# Patient Record
Sex: Female | Born: 1950 | Race: White | Hispanic: No | Marital: Married | Smoking: Never smoker
Health system: Southern US, Community
[De-identification: ages and names within clinical notes are randomized; demographics above are authoritative.]

## PROBLEM LIST (undated history)

## (undated) DIAGNOSIS — C50912 Malignant neoplasm of unspecified site of left female breast: Secondary | ICD-10-CM

## (undated) DIAGNOSIS — M8580 Other specified disorders of bone density and structure, unspecified site: Secondary | ICD-10-CM

## (undated) DIAGNOSIS — M1990 Unspecified osteoarthritis, unspecified site: Secondary | ICD-10-CM

## (undated) DIAGNOSIS — Z9889 Other specified postprocedural states: Secondary | ICD-10-CM

## (undated) DIAGNOSIS — R002 Palpitations: Secondary | ICD-10-CM

## (undated) DIAGNOSIS — T7840XA Allergy, unspecified, initial encounter: Secondary | ICD-10-CM

## (undated) DIAGNOSIS — E785 Hyperlipidemia, unspecified: Secondary | ICD-10-CM

## (undated) DIAGNOSIS — R112 Nausea with vomiting, unspecified: Secondary | ICD-10-CM

## (undated) DIAGNOSIS — J189 Pneumonia, unspecified organism: Secondary | ICD-10-CM

## (undated) DIAGNOSIS — R42 Dizziness and giddiness: Secondary | ICD-10-CM

## (undated) HISTORY — PX: CHOLECYSTECTOMY: SHX55

## (undated) HISTORY — PX: EYE SURGERY: SHX253

## (undated) HISTORY — DX: Dizziness and giddiness: R42

## (undated) HISTORY — PX: COLONOSCOPY: SHX174

## (undated) HISTORY — PX: BREAST SURGERY: SHX581

## (undated) HISTORY — DX: Other specified disorders of bone density and structure, unspecified site: M85.80

## (undated) HISTORY — DX: Unspecified osteoarthritis, unspecified site: M19.90

## (undated) HISTORY — DX: Hyperlipidemia, unspecified: E78.5

---

## 2002-07-28 LAB — HM COLONOSCOPY: HM Colonoscopy: NORMAL

## 2004-11-06 ENCOUNTER — Ambulatory Visit: Payer: Self-pay | Admitting: Internal Medicine

## 2005-11-10 ENCOUNTER — Ambulatory Visit: Payer: Self-pay | Admitting: Internal Medicine

## 2006-12-25 ENCOUNTER — Ambulatory Visit: Payer: Self-pay | Admitting: Internal Medicine

## 2006-12-28 ENCOUNTER — Ambulatory Visit: Payer: Self-pay | Admitting: Internal Medicine

## 2007-03-14 ENCOUNTER — Emergency Department: Payer: Self-pay | Admitting: Emergency Medicine

## 2007-06-30 ENCOUNTER — Ambulatory Visit: Payer: Self-pay | Admitting: Internal Medicine

## 2007-12-01 ENCOUNTER — Ambulatory Visit: Payer: Self-pay | Admitting: Rheumatology

## 2008-01-03 ENCOUNTER — Ambulatory Visit: Payer: Self-pay | Admitting: Internal Medicine

## 2008-05-18 ENCOUNTER — Ambulatory Visit: Payer: Self-pay | Admitting: Internal Medicine

## 2008-06-20 ENCOUNTER — Ambulatory Visit: Payer: Self-pay | Admitting: Internal Medicine

## 2008-12-01 LAB — HM PAP SMEAR: HM Pap smear: NORMAL

## 2008-12-20 ENCOUNTER — Ambulatory Visit: Payer: Self-pay | Admitting: Internal Medicine

## 2008-12-22 ENCOUNTER — Emergency Department: Payer: Self-pay | Admitting: Emergency Medicine

## 2009-03-01 ENCOUNTER — Ambulatory Visit: Payer: Self-pay | Admitting: Internal Medicine

## 2009-12-28 LAB — HM MAMMOGRAPHY

## 2010-03-07 ENCOUNTER — Ambulatory Visit: Payer: Self-pay | Admitting: Internal Medicine

## 2010-03-18 ENCOUNTER — Ambulatory Visit: Payer: Self-pay | Admitting: Cardiovascular Disease

## 2010-03-18 DIAGNOSIS — R55 Syncope and collapse: Secondary | ICD-10-CM | POA: Insufficient documentation

## 2010-07-30 NOTE — Assessment & Plan Note (Signed)
Summary: EKG/AMD  Nurse Visit   Vital Signs:  Patient profile:   60 year old female Height:      73 inches Weight:      173 pounds BMI:     22.91 Pulse rate:   65 / minute Pulse rhythm:   regular BP sitting:   119 / 84  (left arm) Cuff size:   regular  Vitals Entered By: Bishop Dublin, CMA (March 18, 2010 8:38 AM)  Visit Type:  Nurse visit  CC:  EKG/syncope.   Orders Added: 1)  EKG w/ Interpretation [93000]

## 2010-10-02 LAB — HEPATIC FUNCTION PANEL
ALT: 17 U/L (ref 7–35)
AST: 20 U/L (ref 13–35)

## 2010-10-02 LAB — CBC AND DIFFERENTIAL
Hemoglobin: 14.5 g/dL (ref 12.0–16.0)
WBC: 3.9 10^3/mL

## 2010-10-02 LAB — BASIC METABOLIC PANEL
Creatinine: 0.8 mg/dL (ref 0.5–1.1)
Glucose: 97 mg/dL
Potassium: 3.8 mmol/L (ref 3.4–5.3)

## 2010-10-02 LAB — LIPID PANEL: Cholesterol: 178 mg/dL (ref 0–200)

## 2010-10-02 LAB — TSH: TSH: 3.65 u[IU]/mL (ref 0.41–5.90)

## 2011-01-14 ENCOUNTER — Encounter: Payer: Self-pay | Admitting: Cardiovascular Disease

## 2011-02-17 ENCOUNTER — Encounter: Payer: Self-pay | Admitting: Internal Medicine

## 2011-02-17 ENCOUNTER — Ambulatory Visit (INDEPENDENT_AMBULATORY_CARE_PROVIDER_SITE_OTHER): Payer: BC Managed Care – PPO | Admitting: Internal Medicine

## 2011-02-17 VITALS — BP 106/58 | HR 62 | Temp 98.0°F | Resp 14 | Ht 72.5 in | Wt 180.8 lb

## 2011-02-17 DIAGNOSIS — M8580 Other specified disorders of bone density and structure, unspecified site: Secondary | ICD-10-CM

## 2011-02-17 DIAGNOSIS — M949 Disorder of cartilage, unspecified: Secondary | ICD-10-CM

## 2011-02-17 DIAGNOSIS — L719 Rosacea, unspecified: Secondary | ICD-10-CM

## 2011-02-17 DIAGNOSIS — R42 Dizziness and giddiness: Secondary | ICD-10-CM | POA: Insufficient documentation

## 2011-02-17 DIAGNOSIS — E785 Hyperlipidemia, unspecified: Secondary | ICD-10-CM | POA: Insufficient documentation

## 2011-02-17 DIAGNOSIS — Z1231 Encounter for screening mammogram for malignant neoplasm of breast: Secondary | ICD-10-CM

## 2011-02-17 MED ORDER — ALENDRONATE SODIUM 70 MG PO TABS: 70.0000 mg | ORAL_TABLET | ORAL | Status: AC

## 2011-02-17 MED ORDER — DOXYCYCLINE HYCLATE 50 MG PO CAPS: 50.0000 mg | ORAL_CAPSULE | Freq: Two times a day (BID) | ORAL | Status: AC

## 2011-02-17 NOTE — Progress Notes (Signed)
Subjective:    Patient ID: Deborah Mcconnell, female    DOB: 1951/04/24, 60 y.o.   MRN: [id number]  HPI  1. Osteoporosis - Pt with h/o osteopenia on DEXA.  Unable to afford Actonel and Evista because of recent financial issues. Would like to consider alternative medication. Also questions risk of staying on calcium supplementation.  2. Rosacea - Pt followed by dermatology, on [place] and Oracea. Unable to afford oracea. Would like to consider more affordable alternative. Reports rosacea has been well controlled on oracea. Denies dysphagia, abdominal pain, or other complaints related to medication use.  Outpatient Encounter Prescriptions as of 02/17/2011  Medication Sig Dispense Refill  . aspirin EC 81 MG tablet Take 81 mg by mouth as needed.        . Azelaic Acid (FINACEA) 15 % cream Apply topically. After skin is thoroughly washed and patted dry, gently but thoroughly massage a thin film of azelaic acid cream into the affected area twice daily, in the morning and evening.       . meloxicam (MOBIC) 15 MG tablet Take 15 mg by mouth daily.        . simvastatin (ZOCOR) 20 MG tablet Take 20 mg by mouth daily.        Marland Kitchen DISCONTD: calcium citrate-vitamin D (CITRACAL+D) 315-200 MG-UNIT per tablet Take by mouth daily. Take 1500-2000 mg.       . DISCONTD: raloxifene (EVISTA) 60 MG tablet Take 60 mg by mouth daily.        Marland Kitchen DISCONTD: risedronate (ACTONEL) 150 MG tablet Take 150 mg by mouth every 30 (thirty) days. with water on empty stomach, nothing by mouth or lie down for next 30 minutes.       Marland Kitchen alendronate (FOSAMAX) 70 MG tablet Take 1 tablet (70 mg total) by mouth every 7 (seven) days. Take with a full glass of water on an empty stomach.  4 tablet  11  . doxycycline (VIBRAMYCIN) 50 MG capsule Take 1 capsule (50 mg total) by mouth 2 (two) times daily.  60 capsule  6  . DISCONTD: doxycycline (ORACEA) 40 MG capsule Take 40 mg by mouth daily.           Review of Systems  Constitutional: Negative for  fever, chills, appetite change and fatigue.  HENT: Negative for trouble swallowing.   Respiratory: Negative for cough and shortness of breath.   Cardiovascular: Negative for chest pain.  Musculoskeletal: Positive for arthralgias.  Skin: Negative for color change and rash.  Neurological: Negative for weakness.   Filed Vitals:   02/17/11 1438  BP: 106/58  Pulse: 62  Temp: 98 F (36.7 C)  Resp: 14       Objective:   Physical Exam  Constitutional: She is oriented to person, place, and time. She appears well-developed and well-nourished. No distress.  HENT:  Head: Normocephalic and atraumatic.  Nose: Nose normal.  Eyes: Conjunctivae and EOM are normal. Right eye exhibits no discharge. Left eye exhibits no discharge. No scleral icterus.  Neck: Normal range of motion. Neck supple. No tracheal deviation present. No thyromegaly present.  Cardiovascular: Normal rate, regular rhythm and normal heart sounds.  Exam reveals no gallop and no friction rub.   No murmur heard. Pulmonary/Chest: Effort normal and breath sounds normal. No respiratory distress. She has no wheezes. She has no rales. She exhibits no tenderness.  Musculoskeletal: Normal range of motion.  Lymphadenopathy:    She has no cervical adenopathy.  Neurological: She is alert and oriented to  person, place, and time.  Skin: Skin is warm and dry. No rash noted. She is not diaphoretic. No erythema.  Psychiatric: She has a normal mood and affect. Her behavior is normal. Judgment and thought content normal.          Assessment & Plan:  1. Osteoporosis - Will stop Actonel and Evista because of recent financial issues. Will start Fosamax 70mg  weekly. Will request records on last DEXA and make sure pt UTD. We discussed recent data on calcium supplementation and cardiovascular risk. She will stop calcium supplements, however maintain calcium in her diet. She will continue Vit D supplementation at 1000units daily. We will check Vit D  with next labs.  2. Rosacea - Will change to doxycyline 50mg  bid.  Discussed risk of doxycycline and gave handout on side effects. She will call if any problems with medication.  She will avoid sun exposure. RTC in 6 months and prn.

## 2011-02-17 NOTE — Patient Instructions (Addendum)
Stop Actonel and Evista. Start Fosamax. Stop Oracea. Start Doxycyline as per instructions.  Stop calcium supplements. Continue diet rich in calcium (vegetables, nuts, dairy). Take Vit D 1000-2000units daily.      What is this medicine? DOXYCYCLINE (dox i SYE kleen) is a tetracycline antibiotic. It kills certain bacteria or stops their growth. It is used to treat many kinds of infections, like dental, skin, respiratory, and urinary tract infections. It also treats acne, Lyme disease, malaria, and certain sexually transmitted infections. This medicine may be used for other purposes; ask your health care provider or pharmacist if you have questions.   What should I tell my health care provider before I take this medicine? They need to know if you have any of these conditions: -liver disease -long exposure to sunlight like working outdoors -stomach problems like colitis -an unusual or allergic reaction to doxycycline, tetracycline antibiotics, other medicines, foods, dyes, or preservatives -pregnant or trying to get pregnant -breast-feeding   How should I use this medicine? Take this medicine by mouth with a full glass of water. Follow the directions on the prescription label. It is best to take this medicine without food, but if it upsets your stomach take it with food. Take your medicine at regular intervals. Do not take your medicine more often than directed. Take all of your medicine as directed even if you think you are better. Do not skip doses or stop your medicine early.   Talk to your pediatrician regarding the use of this medicine in children. Special care may be needed. While this drug may be prescribed for children as young as 8 years old for selected conditions, precautions do apply.   Overdosage: If you think you have taken too much of this medicine contact a poison control center or emergency room at once. NOTE: This medicine is only for you. Do not share this medicine with  others.   What if I miss a dose? If you miss a dose, take it as soon as you can. If it is almost time for your next dose, take only that dose. Do not take double or extra doses.   What may interact with this medicine? -antacids -barbiturates -birth control pills -bismuth subsalicylate -carbamazepine -methoxyflurane -other antibiotics -phenytoin -vitamins that contain iron -warfarin   This list may not describe all possible interactions. Give your health care provider a list of all the medicines, herbs, non-prescription drugs, or dietary supplements you use. Also tell them if you smoke, drink alcohol, or use illegal drugs. Some items may interact with your medicine.   What should I watch for while using this medicine? Tell your doctor or health care professional if your symptoms do not improve.   Do not treat diarrhea with over the counter products. Contact your doctor if you have diarrhea that lasts more than 2 days or if it is severe and watery.   Do not take this medicine just before going to bed. It may not dissolve properly when you lay down and can cause pain in your throat. Drink plenty of fluids while taking this medicine to also help reduce irritation in your throat.   This medicine can make you more sensitive to the sun. Keep out of the sun. If you cannot avoid being in the sun, wear protective clothing and use sunscreen. Do not use sun lamps or tanning beds/booths.   Birth control pills may not work properly while you are taking this medicine. Talk to your doctor about using an extra  method of birth control.   If you are being treated for a sexually transmitted infection, avoid sexual contact until you have finished your treatment. Your sexual partner may also need treatment.   Avoid antacids, aluminum, calcium, magnesium, and iron products for 4 hours before and 2 hours after taking a dose of this medicine.   If you are using this medicine to prevent malaria, you should  still protect yourself from contact with mosquitos. Stay in screened-in areas, use mosquito nets, keep your body covered, and use an insect repellent.   What side effects may I notice from receiving this medicine? Side effects that you should report to your doctor or health care professional as soon as possible: -allergic reactions like skin rash, itching or hives, swelling of the face, lips, or tongue -difficulty breathing -fever -itching in the rectal or genital area -pain on swallowing -redness, blistering, peeling or loosening of the skin, including inside the mouth -severe stomach pain or cramps -unusual bleeding or bruising -unusually weak or tired -yellowing of the eyes or skin   Side effects that usually do not require medical attention (report to your doctor or health care professional if they continue or are bothersome): -diarrhea -loss of appetite -nausea, vomiting   This list may not describe all possible side effects. Call your doctor for medical advice about side effects. You may report side effects to FDA at 1-800-FDA-1088.   Where should I keep my medicine? Keep out of the reach of children.   Store at room temperature, below 30 degrees C (86 degrees F). Protect from light. Keep container tightly closed. Throw away any unused medicine after the expiration date. Taking this medicine after the expiration date can make you seriously ill.   NOTE:This sheet is a summary. It may not cover all possible information. If you have questions about this medicine, talk to your doctor, pharmacist, or health care provider.      2011, [employer].

## 2011-02-24 ENCOUNTER — Other Ambulatory Visit (INDEPENDENT_AMBULATORY_CARE_PROVIDER_SITE_OTHER): Payer: BC Managed Care – PPO | Admitting: *Deleted

## 2011-02-24 ENCOUNTER — Telehealth: Payer: Self-pay | Admitting: Internal Medicine

## 2011-02-24 DIAGNOSIS — N390 Urinary tract infection, site not specified: Secondary | ICD-10-CM

## 2011-02-24 LAB — POCT URINALYSIS DIPSTICK: Nitrite, UA: NEGATIVE

## 2011-02-24 MED ORDER — SULFAMETHOXAZOLE-TRIMETHOPRIM 800-160 MG PO TABS: 1.0000 | ORAL_TABLET | Freq: Two times a day (BID) | ORAL | Status: AC

## 2011-02-24 NOTE — Telephone Encounter (Signed)
Please contact patient, she needs to know when she had her last labwork, bone density, and mammogram

## 2011-02-25 NOTE — Telephone Encounter (Signed)
Her last labwork that I found was done on 10/02/2010.  Couldn't find anything on mammogram or bone density.

## 2011-02-25 NOTE — Telephone Encounter (Signed)
We will need to request mammogram and bone density from [facility] then.

## 2011-03-24 ENCOUNTER — Telehealth: Payer: Self-pay | Admitting: Internal Medicine

## 2011-03-25 ENCOUNTER — Ambulatory Visit: Payer: Self-pay | Admitting: Internal Medicine

## 2011-03-26 ENCOUNTER — Ambulatory Visit (INDEPENDENT_AMBULATORY_CARE_PROVIDER_SITE_OTHER): Payer: BC Managed Care – PPO | Admitting: Internal Medicine

## 2011-03-26 ENCOUNTER — Encounter: Payer: Self-pay | Admitting: Internal Medicine

## 2011-03-26 VITALS — BP 119/79 | HR 68 | Temp 98.5°F | Resp 14 | Ht 71.0 in | Wt 176.0 lb

## 2011-03-26 DIAGNOSIS — E785 Hyperlipidemia, unspecified: Secondary | ICD-10-CM

## 2011-03-26 DIAGNOSIS — R002 Palpitations: Secondary | ICD-10-CM

## 2011-03-26 DIAGNOSIS — R928 Other abnormal and inconclusive findings on diagnostic imaging of breast: Secondary | ICD-10-CM

## 2011-03-26 NOTE — Patient Instructions (Signed)
We will set up mammogram at [facility]. Return to clinic in 57month.

## 2011-03-26 NOTE — Progress Notes (Signed)
Subjective:    Patient ID: Serita Butcher, female    DOB: 1951/04/10, 60 y.o.   MRN: [id number]  HPI Ms. Banes is a 60 year old female who presents with a concern of abnormal mammogram. She reports that she had her screening mammogram earlier this week and she was called with the report that there is an abnormality in her left breast. We reviewed the mammogram today. He describes an asymmetry within the left breast. The radiologist has recommended compression imaging and possibly ultrasound. This [place] reports that in the past she has had abnormal mammogram of her left breast which required needle biopsy. She also had a lumpectomy in her right breast. Both biopsies were negative. She is concerned that the mammogram performed at [facility] was not compared to previous mammograms performed at [facility] in 2010. She questions if she should return to [facility] for additional imaging or have her imaging performed locally. She denies any new masses that she has appreciated within her breast. She denies any nipple discharge. She denies any fever, chills, fatigue.  Her second concern today is recent episodes of palpitations. She reports that she has had episodes of palpitations in the past and this was worked up with a Holter monitor which was unremarkable. She reports that she feels a fluttering sensation in her chest which radiates up into her neck. These episodes occur at random, however she feels they may be exacerbated by chronic pain she has in her upper back. She denies any chest pain, diaphoresis, nausea, shortness of breath, or other complaints.  Outpatient Encounter Prescriptions as of 03/26/2011  Medication Sig Dispense Refill  . doxycycline (VIBRAMYCIN) 50 MG capsule Take 50 mg by mouth daily.        Marland Kitchen alendronate (FOSAMAX) 70 MG tablet Take 1 tablet (70 mg total) by mouth every 7 (seven) days. Take with a full glass of water on an empty stomach.  4 tablet  11  .  aspirin EC 81 MG tablet Take 81 mg by mouth as needed.        . Azelaic Acid (FINACEA) 15 % cream Apply topically. After skin is thoroughly washed and patted dry, gently but thoroughly massage a thin film of azelaic acid cream into the affected area twice daily, in the morning and evening.       . meloxicam (MOBIC) 15 MG tablet Take 15 mg by mouth daily.        . simvastatin (ZOCOR) 20 MG tablet Take 20 mg by mouth daily.           Review of Systems  Constitutional: Negative for fever, chills, appetite change, fatigue and unexpected weight change.  HENT: Negative for ear pain, congestion, sore throat, trouble swallowing, neck pain, voice change and sinus pressure.   Eyes: Negative for visual disturbance.  Respiratory: Negative for cough, shortness of breath, wheezing and stridor.   Cardiovascular: Positive for palpitations. Negative for chest pain and leg swelling.  Gastrointestinal: Negative for nausea, vomiting, abdominal pain, diarrhea, constipation, blood in stool, abdominal distention and anal bleeding.  Genitourinary: Negative for dysuria and flank pain.  Musculoskeletal: Negative for myalgias, arthralgias and gait problem.  Skin: Negative for color change and rash.  Neurological: Negative for dizziness and headaches.  Hematological: Negative for adenopathy. Does not bruise/bleed easily.  Psychiatric/Behavioral: Negative for suicidal ideas, sleep disturbance and dysphoric mood. The patient is not nervous/anxious.    BP 119/79  Pulse 68  Temp(Src) 98.5 F (36.9 C) (Oral)  Resp 14  Ht 5\' 11"  (1.803 m)  Wt 176 lb (79.833 kg)  BMI 24.55 kg/m2  SpO2 100%     Objective:   Physical Exam  Constitutional: She is oriented to person, place, and time. She appears well-developed and well-nourished. No distress.  HENT:  Head: Normocephalic and atraumatic.  Right Ear: External ear normal.  Left Ear: External ear normal.  Nose: Nose normal.  Mouth/Throat: Oropharynx is clear and moist.  No oropharyngeal exudate.  Eyes: Conjunctivae are normal. Pupils are equal, round, and reactive to light. Right eye exhibits no discharge. Left eye exhibits no discharge. No scleral icterus.  Neck: Normal range of motion. Neck supple. No tracheal deviation present. No thyromegaly present.  Cardiovascular: Normal rate, regular rhythm, normal heart sounds and intact distal pulses.  Exam reveals no gallop and no friction rub.   No murmur heard. Pulmonary/Chest: Effort normal and breath sounds normal. No respiratory distress. She has no wheezes. She has no rales. She exhibits no tenderness. Right breast exhibits no inverted nipple, no mass, no nipple discharge, no skin change and no tenderness. Left breast exhibits no inverted nipple, no mass, no nipple discharge, no skin change and no tenderness. Breasts are symmetrical.    Musculoskeletal: Normal range of motion. She exhibits no edema and no tenderness.  Lymphadenopathy:    She has no cervical adenopathy.  Neurological: She is alert and oriented to person, place, and time. No cranial nerve deficit. She exhibits normal muscle tone. Coordination normal.  Skin: Skin is warm and dry. No rash noted. She is not diaphoretic. No erythema. No pallor.  Psychiatric: She has a normal mood and affect. Her behavior is normal. Judgment and thought content normal.          Assessment & Plan:  1. Abnormal mammogram - in light of her history of having recent needle biopsy performed in the left breast at [facility], I think it makes sense for her to have additional imaging studies performed at [facility] for continuity. This way the radiologist will be able to compare previous images to new images. We will plan to set her up at [facility] for additional imaging of her left breast.  2. Palpitations - in regards to her palpitations, she has had workup in the past including Holter monitor which she reports was normal. I offered to  repeat EKG today. She would prefer to wait until her next visit. I suspect that her palpitations may be worsened by anxiety given recent issues with abnormal mammogram. We will plan to bring her back in one month to reevaluate. If her symptoms are persistent, will consider EKG.

## 2011-03-27 ENCOUNTER — Other Ambulatory Visit: Payer: Self-pay | Admitting: Internal Medicine

## 2011-03-28 ENCOUNTER — Encounter: Payer: Self-pay | Admitting: Internal Medicine

## 2011-03-28 LAB — COMPREHENSIVE METABOLIC PANEL
ALT: 15 U/L (ref 0–35)
Albumin: 4.4 g/dL (ref 3.5–5.2)
BUN: 19 mg/dL (ref 6–23)
CO2: 29 mEq/L (ref 19–32)
Calcium: 9 mg/dL (ref 8.4–10.5)
Chloride: 107 mEq/L (ref 96–112)
Creatinine, Ser: 0.7 mg/dL (ref 0.4–1.2)
GFR: 93.61 mL/min (ref >60.00)
Potassium: 4.2 mEq/L (ref 3.5–5.1)

## 2011-03-28 NOTE — Telephone Encounter (Signed)
Opened in error

## 2011-04-08 ENCOUNTER — Encounter: Payer: Self-pay | Admitting: Internal Medicine

## 2011-04-28 ENCOUNTER — Encounter: Payer: Self-pay | Admitting: Internal Medicine

## 2011-04-28 ENCOUNTER — Other Ambulatory Visit: Payer: Self-pay | Admitting: Internal Medicine

## 2011-04-28 ENCOUNTER — Ambulatory Visit (INDEPENDENT_AMBULATORY_CARE_PROVIDER_SITE_OTHER): Payer: BC Managed Care – PPO | Admitting: Internal Medicine

## 2011-04-28 VITALS — BP 124/77 | HR 68 | Temp 97.9°F | Resp 12 | Ht 71.0 in | Wt 182.0 lb

## 2011-04-28 DIAGNOSIS — R002 Palpitations: Secondary | ICD-10-CM

## 2011-04-28 DIAGNOSIS — M25569 Pain in unspecified knee: Secondary | ICD-10-CM

## 2011-04-28 DIAGNOSIS — M25562 Pain in left knee: Secondary | ICD-10-CM

## 2011-04-28 NOTE — Progress Notes (Signed)
Subjective:    Patient ID: Deborah Mcconnell, female    DOB: 1951/04/06, 60 y.o.   MRN: [id number]  HPI 60 year old female with history of palpitations presents for followup. She reports that she continues to have some episodes of palpitations. These tend to be short lived typically lasting for less than a minute. They tend to be clustered in groups. For example, she will notice palpitations intermittently over a couple of days and then they will go away. She denies any chest pain, diaphoresis, shortness of breath, or other symptoms during her palpitations. She has had workup in the past including Holter monitor which was normal. She does not currently use any medication to stop or prevent palpitations.  She also presents to followup with recent abnormal mammogram. She was noted to have abnormality on mammogram performed at [facility]. She subsequently went and had additional views performed at [facility] and reports that these were read as normal. She is now scheduled for a six-month followup mammogram.  She notes ongoing left knee pain over the last few weeks. She has intermittently had pain in both of her knees but recently pain in her left knee and posterior to her left knee have increased. In the past, she had a Baker's cyst in her left knee and she reports that the pain is similar to that episode. Pain in her knees has limited her ability to walk for exercise. She has taken meloxicam for pain in her knees with minimal improvement. She has not had steroid injections or other interventions. She did have plain x-rays of her left knee in the past.  Outpatient Encounter Prescriptions as of 04/28/2011  Medication Sig Dispense Refill  . alendronate (FOSAMAX) 70 MG tablet Take 1 tablet (70 mg total) by mouth every 7 (seven) days. Take with a full glass of water on an empty stomach.  4 tablet  11  . aspirin EC 81 MG tablet Take 81 mg by mouth as needed.        . Azelaic Acid  (FINACEA) 15 % cream Apply topically. After skin is thoroughly washed and patted dry, gently but thoroughly massage a thin film of azelaic acid cream into the affected area twice daily, in the morning and evening.       Marland Kitchen doxycycline (VIBRAMYCIN) 50 MG capsule Take 50 mg by mouth daily.        . meclizine (ANTIVERT) 25 MG tablet Take 1 tablet by mouth as needed.      . meloxicam (MOBIC) 15 MG tablet TAKE ONE TABLET BY MOUTH EVERY DAY  30 tablet  11  . simvastatin (ZOCOR) 20 MG tablet TAKE ONE TABLET BY MOUTH EVERY DAY  30 tablet  3    Review of Systems  Constitutional: Negative for fever, chills, appetite change, fatigue and unexpected weight change.  HENT: Negative for ear pain, congestion, sore throat, trouble swallowing, neck pain, voice change and sinus pressure.   Eyes: Negative for visual disturbance.  Respiratory: Negative for cough, shortness of breath, wheezing and stridor.   Cardiovascular: Positive for palpitations. Negative for chest pain and leg swelling.  Gastrointestinal: Negative for nausea, vomiting, abdominal pain, diarrhea, constipation, blood in stool, abdominal distention and anal bleeding.  Genitourinary: Negative for dysuria and flank pain.  Musculoskeletal: Positive for myalgias, joint swelling and arthralgias. Negative for gait problem.  Skin: Negative for color change and rash.  Neurological: Negative for dizziness and headaches.  Hematological: Negative for adenopathy. Does not bruise/bleed easily.  Psychiatric/Behavioral: Negative for  suicidal ideas, sleep disturbance and dysphoric mood. The patient is not nervous/anxious.    BP 124/77  Pulse 68  Temp(Src) 97.9 F (36.6 C) (Oral)  Resp 12  Ht 5\' 11"  (1.803 m)  Wt 182 lb (82.555 kg)  BMI 25.38 kg/m2  SpO2 99%     Objective:   Physical Exam  Constitutional: She is oriented to person, place, and time. She appears well-developed and well-nourished. No distress.  HENT:  Head: Normocephalic and atraumatic.    Right Ear: External ear normal.  Left Ear: External ear normal.  Nose: Nose normal.  Mouth/Throat: Oropharynx is clear and moist. No oropharyngeal exudate.  Eyes: Conjunctivae are normal. Pupils are equal, round, and reactive to light. Right eye exhibits no discharge. Left eye exhibits no discharge. No scleral icterus.  Neck: Normal range of motion. Neck supple. No tracheal deviation present. No thyromegaly present.  Cardiovascular: Normal rate, regular rhythm, normal heart sounds and intact distal pulses.  Exam reveals no gallop and no friction rub.   No murmur heard. Pulmonary/Chest: Effort normal and breath sounds normal. No respiratory distress. She has no wheezes. She has no rales. She exhibits no tenderness.  Musculoskeletal: She exhibits no edema and no tenderness.       Right knee: tenderness found.       Left knee: She exhibits decreased range of motion and swelling. tenderness found.       Legs: Lymphadenopathy:    She has no cervical adenopathy.  Neurological: She is alert and oriented to person, place, and time. No cranial nerve deficit. She exhibits normal muscle tone. Coordination normal.  Skin: Skin is warm and dry. No rash noted. She is not diaphoretic. No erythema. No pallor.  Psychiatric: She has a normal mood and affect. Her behavior is normal. Judgment and thought content normal.          Assessment & Plan:  1. Left knee pain -exam is remarkable for crepitus in bilateral knees. Suspect that the pain in her left knee is secondary to osteoarthritis. We discussed getting repeat plain films of her left knee and referring her to orthopedics for steroids and possibly Synvisc injections. She would like to hold off for now. She will call or return to clinic if she would like to pursue any additional evaluation. In the interim, she will continue to use her meloxicam.  2. Palpitations -palpitations have been long-standing. Patient had extensive workup including Holter monitor  which was normal. Today, we again discussed using medications such as beta blockers during episodes of palpitations to stop palpitations and as a preventative measure. She would like to hold off on the addition of any medications at this time. She will call or return to clinic if symptoms are worsening.  3. Abnormal mammogram -patient reports that repeat mammogram performed at [facility] was normal. We will request records on this.

## 2011-05-03 ENCOUNTER — Telehealth: Payer: Self-pay | Admitting: *Deleted

## 2011-05-03 DIAGNOSIS — Z1211 Encounter for screening for malignant neoplasm of colon: Secondary | ICD-10-CM

## 2011-05-03 DIAGNOSIS — M8580 Other specified disorders of bone density and structure, unspecified site: Secondary | ICD-10-CM

## 2011-05-03 NOTE — Telephone Encounter (Signed)
Message copied by Vernie Murders on Sat May 03, 2011 12:42 PM ------      Message from: Ronna Polio A      Created: Tue Apr 29, 2011 11:43 AM      Regarding: Colonoscopy and Bone Density       Pt had MRI left knee in 2009 which showed some degenerative changes. She can call at any time if she would like to repeat imaging. (We discussed this at her visit)      The hospital was NOT able to find colonoscopy or bone density record.  We should schedule both of these at a time convenient for her.

## 2011-05-03 NOTE — Telephone Encounter (Signed)
Error

## 2011-05-05 NOTE — Telephone Encounter (Signed)
Both referrals entered

## 2011-05-07 ENCOUNTER — Other Ambulatory Visit: Payer: Self-pay | Admitting: Internal Medicine

## 2011-05-07 ENCOUNTER — Telehealth: Payer: Self-pay | Admitting: Internal Medicine

## 2011-05-07 ENCOUNTER — Other Ambulatory Visit (INDEPENDENT_AMBULATORY_CARE_PROVIDER_SITE_OTHER): Payer: BC Managed Care – PPO | Admitting: Internal Medicine

## 2011-05-07 DIAGNOSIS — N390 Urinary tract infection, site not specified: Secondary | ICD-10-CM

## 2011-05-07 LAB — POCT URINALYSIS DIPSTICK
Glucose, UA: NEGATIVE
Nitrite, UA: NEGATIVE
Protein, UA: NEGATIVE
Spec Grav, UA: 1.01
Urobilinogen, UA: 0.2

## 2011-05-07 MED ORDER — SULFAMETHOXAZOLE-TRIMETHOPRIM 800-160 MG PO TABS: 1.0000 | ORAL_TABLET | Freq: Two times a day (BID) | ORAL | Status: AC

## 2011-05-07 NOTE — Telephone Encounter (Signed)
Patient has ? urinary tract infection. Will come in to give sample.

## 2011-05-07 NOTE — Telephone Encounter (Signed)
Already taken care of, given ABX

## 2011-05-10 LAB — URINE CULTURE: Organism ID, Bacteria: NO GROWTH

## 2011-07-07 ENCOUNTER — Telehealth: Payer: Self-pay | Admitting: Internal Medicine

## 2011-07-07 NOTE — Telephone Encounter (Signed)
i called pt to check if she had her bone density done and she stated that we were to check to if it was time for the bone density and a colonscopy. I called [facility] Deborah Mcconnell stated that per patient she had colonoscopy done 2005 and she wasn't  for sure about bone density she told me to call [facility] dr Deborah Mcconnell. I called that office and was told that Deborah Deborah Mcconnell bone density was done in 2009 and they had no record of a colonscopy.  Does Deborah Mcconnell need her bone density  And also is it time for the colonscopy Thanks  Zella Ball

## 2011-07-08 NOTE — Telephone Encounter (Signed)
Yes, she will need to be scheduled for bone density. We need to get record on colonoscopy to see when this is due.

## 2011-07-15 NOTE — Telephone Encounter (Signed)
Left message for pt to call office

## 2011-07-16 ENCOUNTER — Emergency Department: Payer: Self-pay | Admitting: Emergency Medicine

## 2011-07-16 LAB — COMPREHENSIVE METABOLIC PANEL
Albumin: 4.1 g/dL (ref 3.4–5.0)
Anion Gap: 14 (ref 7–16)
Chloride: 105 mmol/L (ref 98–107)
Co2: 26 mmol/L (ref 21–32)
Creatinine: 0.63 mg/dL (ref 0.60–1.30)
EGFR (African American): >60
EGFR (Non-African Amer.): >60
Glucose: 109 mg/dL — ABNORMAL HIGH (ref 65–99)
Osmolality: 290 (ref 275–301)
SGPT (ALT): 24 U/L
Sodium: 145 mmol/L (ref 136–145)

## 2011-07-16 LAB — CBC
HCT: 44.3 % (ref 35.0–47.0)
HGB: 14.9 g/dL (ref 12.0–16.0)
MCV: 97 fL (ref 80–100)
Platelet: 242 10*3/uL (ref 150–440)
RDW: 12.8 % (ref 11.5–14.5)
WBC: 6.6 10*3/uL (ref 3.6–11.0)

## 2011-07-16 LAB — TROPONIN I: Troponin-I: <0.02 ng/mL

## 2011-07-16 LAB — CK TOTAL AND CKMB (NOT AT ARMC)
CK, Total: 104 U/L (ref 21–215)
CK-MB: 0.8 ng/mL (ref 0.5–3.6)

## 2011-08-05 ENCOUNTER — Encounter: Payer: Self-pay | Admitting: Internal Medicine

## 2011-08-20 ENCOUNTER — Ambulatory Visit: Payer: BC Managed Care – PPO | Admitting: Internal Medicine

## 2011-08-26 ENCOUNTER — Encounter: Payer: Self-pay | Admitting: Internal Medicine

## 2011-08-26 ENCOUNTER — Ambulatory Visit (INDEPENDENT_AMBULATORY_CARE_PROVIDER_SITE_OTHER): Payer: BC Managed Care – PPO | Admitting: Internal Medicine

## 2011-08-26 VITALS — BP 102/76 | HR 79 | Temp 98.1°F | Ht 73.0 in | Wt 185.0 lb

## 2011-08-26 DIAGNOSIS — H6691 Otitis media, unspecified, right ear: Secondary | ICD-10-CM

## 2011-08-26 DIAGNOSIS — L719 Rosacea, unspecified: Secondary | ICD-10-CM

## 2011-08-26 DIAGNOSIS — H6690 Otitis media, unspecified, unspecified ear: Secondary | ICD-10-CM

## 2011-08-26 MED ORDER — METRONIDAZOLE 0.75 % EX CREA: TOPICAL_CREAM | Freq: Two times a day (BID) | CUTANEOUS | Status: DC

## 2011-08-26 MED ORDER — CLINDAMYCIN PHOS-BENZOYL PEROX 1-5 % EX GEL: Freq: Two times a day (BID) | CUTANEOUS | Status: AC

## 2011-08-26 MED ORDER — PREDNISONE (PAK) 10 MG PO TABS: ORAL_TABLET | ORAL | Status: AC

## 2011-08-26 MED ORDER — AMOXICILLIN-POT CLAVULANATE 875-125 MG PO TABS: 1.0000 | ORAL_TABLET | Freq: Two times a day (BID) | ORAL | Status: AC

## 2011-08-26 NOTE — Progress Notes (Signed)
Subjective:    Patient ID: Deborah Mcconnell, female    DOB: 1950-09-22, 61 y.o.   MRN: [id number]  HPI 61 year old female presents for acute visit complaining of right-sided facial and ear pain. She reports that she has had upper respiratory infection with increased nasal drainage over the last several weeks. Over the last 2 days she has developed severe right-sided facial and ear pain. She describes it as a sensation of pressure or tenderness within her right ear. She has not noticed any change in hearing. She has not taking any medication for this. She does not have any fever or chills. She does not have any trouble moving her job. She does not have any sore throat or trouble swallowing. She does not have any cough or shortness of breath.  She is also concerned about persistent rosacea. She has been followed by dermatology and was treated with topical metronidazole cream and oral doxycycline. She was also using Finacea cream but is no longer able to afford this. She continues to have large nodular pustular lesions on her face. She is interested in other potential options for treatment.  Outpatient Encounter Prescriptions as of 08/26/2011  Medication Sig Dispense Refill  . alendronate (FOSAMAX) 70 MG tablet Take 1 tablet (70 mg total) by mouth every 7 (seven) days. Take with a full glass of water on an empty stomach.  4 tablet  11  . aspirin EC 81 MG tablet Take 81 mg by mouth as needed.        . Azelaic Acid (FINACEA) 15 % cream Apply topically. After skin is thoroughly washed and patted dry, gently but thoroughly massage a thin film of azelaic acid cream into the affected area twice daily, in the morning and evening.       . cholecalciferol (VITAMIN D) 1000 UNITS tablet Take 1,000 Units by mouth daily.      Marland Kitchen doxycycline (VIBRAMYCIN) 50 MG capsule Take 50 mg by mouth daily.        . meclizine (ANTIVERT) 25 MG tablet Take 1 tablet by mouth as needed.      . meloxicam (MOBIC) 15 MG tablet TAKE ONE  TABLET BY MOUTH EVERY DAY  30 tablet  11  . simvastatin (ZOCOR) 20 MG tablet TAKE ONE TABLET BY MOUTH EVERY DAY  30 tablet  3  . vitamin C (ASCORBIC ACID) 500 MG tablet Take 500 mg by mouth daily.      Marland Kitchen amoxicillin-clavulanate (AUGMENTIN) 875-125 MG per tablet Take 1 tablet by mouth 2 (two) times daily.  20 tablet  0  . clindamycin-benzoyl peroxide (BENZACLIN) gel Apply topically 2 (two) times daily. Use sparingly, as needed  25 g  0  . metroNIDAZOLE (METROCREAM) 0.75 % cream Apply topically 2 (two) times daily.  45 g  0  . predniSONE (STERAPRED UNI-PAK) 10 MG tablet Take 60mg  day 1 then taper by 10mg  daily  21 tablet  0    Review of Systems  Constitutional: Negative for fever, chills and unexpected weight change.  HENT: Positive for ear pain and congestion. Negative for hearing loss, nosebleeds, sore throat, facial swelling, rhinorrhea, sneezing, mouth sores, trouble swallowing, neck pain, neck stiffness, voice change, postnasal drip, sinus pressure, tinnitus and ear discharge.   Eyes: Negative for pain, discharge, redness and visual disturbance.  Respiratory: Negative for cough, chest tightness, shortness of breath, wheezing and stridor.   Cardiovascular: Negative for chest pain, palpitations and leg swelling.  Musculoskeletal: Negative for myalgias and arthralgias.  Skin: Positive for rash.  Negative for color change.  Neurological: Negative for dizziness, weakness, light-headedness and headaches.  Hematological: Negative for adenopathy.   BP 102/76  Pulse 79  Temp(Src) 98.1 F (36.7 C) (Oral)  Ht 6\' 1"  (1.854 m)  Wt 185 lb (83.915 kg)  BMI 24.41 kg/m2  SpO2 100%     Objective:   Physical Exam  Constitutional: She is oriented to person, place, and time. She appears well-developed and well-nourished. No distress.  HENT:  Head: Normocephalic and atraumatic.  Right Ear: External ear normal. No tenderness. Tympanic membrane is erythematous and bulging. A middle ear effusion is  present.  Left Ear: External ear normal. A middle ear effusion is present.  Nose: Nose normal.  Mouth/Throat: Oropharynx is clear and moist. No oropharyngeal exudate.  Eyes: Conjunctivae are normal. Pupils are equal, round, and reactive to light. Right eye exhibits no discharge. Left eye exhibits no discharge. No scleral icterus.  Neck: Normal range of motion. Neck supple. No tracheal deviation present. No thyromegaly present.  Cardiovascular: Normal rate, regular rhythm, normal heart sounds and intact distal pulses.  Exam reveals no gallop and no friction rub.   No murmur heard. Pulmonary/Chest: Effort normal and breath sounds normal. No respiratory distress. She has no wheezes. She has no rales. She exhibits no tenderness.  Musculoskeletal: Normal range of motion. She exhibits no edema and no tenderness.  Lymphadenopathy:    She has no cervical adenopathy.  Neurological: She is alert and oriented to person, place, and time. No cranial nerve deficit. She exhibits normal muscle tone. Coordination normal.  Skin: Skin is warm and dry. Rash noted. Rash is pustular (left cheek). She is not diaphoretic. No erythema. No pallor.  Psychiatric: She has a normal mood and affect. Her behavior is normal. Judgment and thought content normal.          Assessment & Plan:

## 2011-08-26 NOTE — Telephone Encounter (Signed)
March 7 @[facility]  imaging @ 1 pt aware

## 2011-08-26 NOTE — Telephone Encounter (Signed)
Pt was seen in the office today, she is overdue, please help get this scheduled

## 2011-08-26 NOTE — Assessment & Plan Note (Signed)
Symptoms not currently well-controlled with metronidazole and doxycycline. Patient has also been using Finacea cream but can no longer afford this. Will try continuing metronidazole and using BenzaClin for large nodular pustules. We also discussed dermatology referral for potential laser treatment. Patient will followup as needed.

## 2011-08-26 NOTE — Assessment & Plan Note (Signed)
Patient symptoms and exam consistent with right otitis media. Will treat with both Augmentin and oral steroid. Patient will call if symptoms are not improving.

## 2011-08-28 ENCOUNTER — Encounter: Payer: Self-pay | Admitting: Internal Medicine

## 2011-09-05 ENCOUNTER — Telehealth: Payer: Self-pay | Admitting: Internal Medicine

## 2011-09-05 NOTE — Telephone Encounter (Signed)
Bone density low. T-score -1.73

## 2011-09-08 ENCOUNTER — Encounter: Payer: Self-pay | Admitting: Internal Medicine

## 2011-09-11 ENCOUNTER — Encounter: Payer: Self-pay | Admitting: Internal Medicine

## 2011-09-11 ENCOUNTER — Other Ambulatory Visit: Payer: Self-pay | Admitting: Internal Medicine

## 2011-10-03 ENCOUNTER — Telehealth: Payer: Self-pay | Admitting: Internal Medicine

## 2011-10-03 NOTE — Telephone Encounter (Signed)
Emergent call Caller: Malaina/Patient; PCP: Ronna Polio; CB#: [phone]; Call regarding Very Dizzy per CSR "THE PATIENT REFUSED 911" reports onset 10/03/11 upon wakening, used Meclizine from OV 10/12, phone connection problems, with nausea. Guideline: Dizziness, with no response to use of RX, hard to get up and walk, appt advised to be seen within 4 hrs, no appt in [facility] meets time frame, call to office nurse: Huntley Dec who advises ER for evaluation. Pt advised of same.

## 2011-10-03 NOTE — Telephone Encounter (Signed)
[facility] Triage Call Report Triage Record Num: [id number] Operator: Di Kindle Patient Name: Deborah Mcconnell Call Date & Time: 10/03/2011 10:31:59AM Patient Phone: [phone] PCP: Ronna Polio Patient Gender: Female PCP Fax : [phone] Patient DOB: Oct 08, 1950 Practice Name: [facility] Reason for Call: Emergent call Caller: [name]; PCP: Ronna Polio; CB#: [phone]; Call regarding Very Dizzy per CSR "THE PATIENT REFUSED 911" reports onset 10/03/11 upon wakening, used Meclizine from OV 10/12, phone connection problems, with nausea. Guideline: Dizziness, with no response to use of RX, hard to get up and walk, appt advised to be seen within 4 hrs, no appt in [facility] meets time frame, call to office nurse: Huntley Dec who advises ER for evaluation. Pt advised of same. Protocol(s) Used: Dizziness or Vertigo Recommended Outcome per Protocol: See Provider within 4 hours Reason for Outcome: Having sensations of turning or spinning that affects balance AND not responsive to 4 hours of home care Care Advice: ~ DO NOT drive until condition evaluated. ~ Call provider if symptoms worsen or new symptoms develop. ~ List, or take, all current prescription(s), nonprescription or alternative medication(s) to provider for evaluation. 10/03/2011 10:49:26AM Page 1 of 1 [email or web address]

## 2011-10-27 ENCOUNTER — Encounter: Payer: Self-pay | Admitting: Internal Medicine

## 2011-10-27 ENCOUNTER — Ambulatory Visit (INDEPENDENT_AMBULATORY_CARE_PROVIDER_SITE_OTHER): Payer: BC Managed Care – PPO | Admitting: Internal Medicine

## 2011-10-27 VITALS — BP 112/74 | HR 72 | Temp 98.4°F | Resp 16 | Ht 72.0 in | Wt 184.5 lb

## 2011-10-27 DIAGNOSIS — Z0000 Encounter for general adult medical examination without abnormal findings: Secondary | ICD-10-CM

## 2011-10-27 DIAGNOSIS — M810 Age-related osteoporosis without current pathological fracture: Secondary | ICD-10-CM | POA: Insufficient documentation

## 2011-10-27 DIAGNOSIS — H9209 Otalgia, unspecified ear: Secondary | ICD-10-CM | POA: Insufficient documentation

## 2011-10-27 DIAGNOSIS — E785 Hyperlipidemia, unspecified: Secondary | ICD-10-CM

## 2011-10-27 DIAGNOSIS — Z23 Encounter for immunization: Secondary | ICD-10-CM

## 2011-10-27 LAB — LIPID PANEL
Cholesterol: 172 mg/dL (ref 0–200)
HDL: 62.9 mg/dL (ref >39.00)
Total CHOL/HDL Ratio: 3
Triglycerides: 75 mg/dL (ref 0.0–149.0)

## 2011-10-27 LAB — CBC WITH DIFFERENTIAL/PLATELET
Eosinophils Relative: 9.9 % — ABNORMAL HIGH (ref 0.0–5.0)
HCT: 42.8 % (ref 36.0–46.0)
Hemoglobin: 14.4 g/dL (ref 12.0–15.0)
Lymphocytes Relative: 20.3 % (ref 12.0–46.0)
Lymphs Abs: 1.1 10*3/uL (ref 0.7–4.0)
Monocytes Relative: 11 % (ref 3.0–12.0)
Neutro Abs: 3.2 10*3/uL (ref 1.4–7.7)
RBC: 4.4 Mil/uL (ref 3.87–5.11)
WBC: 5.5 10*3/uL (ref 4.5–10.5)

## 2011-10-27 LAB — COMPREHENSIVE METABOLIC PANEL
BUN: 12 mg/dL (ref 6–23)
CO2: 30 mEq/L (ref 19–32)
Creatinine, Ser: 0.7 mg/dL (ref 0.4–1.2)
GFR: 96.71 mL/min (ref >60.00)
Glucose, Bld: 90 mg/dL (ref 70–99)
Total Bilirubin: 0.5 mg/dL (ref 0.3–1.2)

## 2011-10-27 MED ORDER — ZOSTER VACCINE LIVE 19400 UNT/0.65ML ~~LOC~~ SOLR: 0.6500 mL | Freq: Once | SUBCUTANEOUS | Status: AC

## 2011-10-27 NOTE — Progress Notes (Signed)
Subjective:    Patient ID: Deborah Mcconnell, female    DOB: [birth year], 61 y.o.   MRN: [id number]  HPI 61 year old female presents for annual exam. She reports she is generally doing well. She notes some recent bilateral ear pain. She was seen by ENT who prescribed prednisone to help with inflammation and bilateral middle ear effusions. She reports some improvement with this. She continues to note some decreased hearing on the left. She has not had fever or chills. She has not had nasal congestion. She notes significant worsening ear pain after recent flight.  Aside from this, she is doing well. She follows a relatively healthy diet and is physically active. She is scheduled to see endocrinology to discuss ongoing issues with osteopenia despite the use of Fosamax.  Outpatient Encounter Prescriptions as of 10/27/2011  Medication Sig Dispense Refill  . alendronate (FOSAMAX) 70 MG tablet Take 1 tablet (70 mg total) by mouth every 7 (seven) days. Take with a full glass of water on an empty stomach.  4 tablet  11  . aspirin EC 81 MG tablet Take 81 mg by mouth as needed.        Marland Kitchen b complex vitamins tablet Take 1 tablet by mouth daily.      . cholecalciferol (VITAMIN D) 1000 UNITS tablet Take 1,000 Units by mouth daily.      . clindamycin-benzoyl peroxide (BENZACLIN) gel Apply topically 2 (two) times daily. Use sparingly, as needed  25 g  0  . doxycycline (VIBRAMYCIN) 50 MG capsule Take 50 mg by mouth daily.        . meclizine (ANTIVERT) 25 MG tablet Take 1 tablet by mouth as needed.      . meloxicam (MOBIC) 15 MG tablet TAKE ONE TABLET BY MOUTH EVERY DAY  30 tablet  11  . metroNIDAZOLE (METROCREAM) 0.75 % cream Apply topically 2 (two) times daily.  45 g  0  . simvastatin (ZOCOR) 20 MG tablet TAKE ONE TABLET BY MOUTH EVERY DAY  30 tablet  3  . vitamin C (ASCORBIC ACID) 500 MG tablet Take 500 mg by mouth daily.      . Azelaic Acid (FINACEA) 15 % cream Apply topically. After skin is thoroughly washed and  patted dry, gently but thoroughly massage a thin film of azelaic acid cream into the affected area twice daily, in the morning and evening.       . zoster vaccine live, PF, (ZOSTAVAX) 25366 UNT/0.65ML injection Inject 19,400 Units into the skin once.  1 each  0    Review of Systems  Constitutional: Negative for fever, chills, appetite change, fatigue and unexpected weight change.  HENT: Positive for hearing loss and ear pain. Negative for congestion, sore throat, trouble swallowing, neck pain, voice change and sinus pressure.   Eyes: Negative for visual disturbance.  Respiratory: Negative for cough, shortness of breath, wheezing and stridor.   Cardiovascular: Negative for chest pain, palpitations and leg swelling.  Gastrointestinal: Negative for nausea, vomiting, abdominal pain, diarrhea, constipation, blood in stool, abdominal distention and anal bleeding.  Genitourinary: Negative for dysuria and flank pain.  Musculoskeletal: Negative for myalgias, arthralgias and gait problem.  Skin: Negative for color change and rash.  Neurological: Negative for dizziness and headaches.  Hematological: Negative for adenopathy. Does not bruise/bleed easily.  Psychiatric/Behavioral: Negative for suicidal ideas, sleep disturbance and dysphoric mood. The patient is not nervous/anxious.    BP 112/74  Pulse 72  Temp(Src) 98.4 F (36.9 C) (Oral)  Resp 16  Ht  6' (1.829 m)  Wt 184 lb 8 oz (83.689 kg)  BMI 25.02 kg/m2     Objective:   Physical Exam  Constitutional: She is oriented to person, place, and time. She appears well-developed and well-nourished. No distress.  HENT:  Head: Normocephalic and atraumatic.  Right Ear: External ear normal. Tympanic membrane is erythematous. A middle ear effusion is present. No decreased hearing is noted.  Left Ear: External ear normal. Tympanic membrane is erythematous. A middle ear effusion is present. Decreased hearing is noted.  Nose: Nose normal.  Mouth/Throat:  Oropharynx is clear and moist. No oropharyngeal exudate.  Eyes: Conjunctivae are normal. Pupils are equal, round, and reactive to light. Right eye exhibits no discharge. Left eye exhibits no discharge. No scleral icterus.  Neck: Normal range of motion. Neck supple. No tracheal deviation present. No thyromegaly present.  Cardiovascular: Normal rate, regular rhythm, normal heart sounds and intact distal pulses.  Exam reveals no gallop and no friction rub.   No murmur heard. Pulmonary/Chest: Effort normal and breath sounds normal. No respiratory distress. She has no wheezes. She has no rales. She exhibits no tenderness. Right breast exhibits no inverted nipple, no mass, no nipple discharge, no skin change and no tenderness. Left breast exhibits no inverted nipple, no mass, no nipple discharge, no skin change and no tenderness. Breasts are symmetrical.    Abdominal: Soft. Normal appearance and bowel sounds are normal. There is no hepatosplenomegaly. There is no tenderness.  Musculoskeletal: Normal range of motion. She exhibits no edema and no tenderness.  Lymphadenopathy:    She has no cervical adenopathy.  Neurological: She is alert and oriented to person, place, and time. No cranial nerve deficit. She exhibits normal muscle tone. Coordination normal.  Skin: Skin is warm and dry. No rash noted. She is not diaphoretic. No erythema. No pallor.  Psychiatric: She has a normal mood and affect. Her behavior is normal. Judgment and thought content normal.          Assessment & Plan:

## 2011-10-27 NOTE — Assessment & Plan Note (Signed)
Exam including breast exam normal today. PAP is UTD normal 2010, will repeat 2015.  Other health maintenance is UTD including mammogram, dexa.  Vaccines are UTD except for zostavax which was ordered today. Labs today including CBC, CMP, lipids, Vit D.

## 2011-10-27 NOTE — Assessment & Plan Note (Signed)
Will check lipids and LFTs with labs today. 

## 2011-10-27 NOTE — Assessment & Plan Note (Signed)
Ongoing bone loss despite use of Fosamax. Recent T score in the femur was -1.7. She has followup to discuss this issue with endocrinology next week. We'll continue vitamin D supplementation. Will check vitamin D level with labs today.

## 2011-10-27 NOTE — Assessment & Plan Note (Signed)
Improved after use of prednisone. May ultimately need TM tubes if increased pressure/pain persists.  Will follow with ENT.

## 2011-12-31 ENCOUNTER — Telehealth: Payer: Self-pay | Admitting: Internal Medicine

## 2011-12-31 NOTE — Telephone Encounter (Signed)
Patient advised as instructed via telephone. 

## 2011-12-31 NOTE — Telephone Encounter (Signed)
Per Revonda Humphrey, RN at [phone]  Caller: Deborah Mcconnell/Patient; PCP: Ronna Polio; CB#: [phone]; ; ; Call regarding Bite Site;  Dizziness started Sun night 6/23 and was severe on Mon 6/24 until evening - saw ENT to see if anything was wrong with ears. Had abd cramping prior to diarrhea on Tues 6/25 that has also stopped. Not feeling well for these last 10 days. Feeling tired, lightheaded, nausea, some dizziness at times. Has had intermittent headahces. Remembers she had tick bite in groin 3 months ago. Now red spot in same area for one week that has gradually gone form small spot to nickel size, is solid red, slightly swollen and itchy. Triaged in Bites Insects Guideline - Disposition: See Provider Within 4 Hours due to history of tick bite, redness at site, headaches, joint pains

## 2011-12-31 NOTE — Telephone Encounter (Signed)
Based on timeline of tick bite, I doubt related to current symptoms, however she should be seen. Will need to go to urgent care, as we are closing and office closed for holiday tomorrow.

## 2012-01-05 ENCOUNTER — Ambulatory Visit: Payer: 59

## 2012-01-05 ENCOUNTER — Other Ambulatory Visit (INDEPENDENT_AMBULATORY_CARE_PROVIDER_SITE_OTHER): Payer: 59 | Admitting: *Deleted

## 2012-01-05 DIAGNOSIS — N390 Urinary tract infection, site not specified: Secondary | ICD-10-CM

## 2012-01-05 LAB — POCT URINALYSIS DIPSTICK
Bilirubin, UA: NEGATIVE
Nitrite, UA: POSITIVE
Protein, UA: NEGATIVE
Urobilinogen, UA: 0.2
pH, UA: 5.5

## 2012-01-06 ENCOUNTER — Telehealth: Payer: Self-pay | Admitting: *Deleted

## 2012-01-06 ENCOUNTER — Other Ambulatory Visit: Payer: Self-pay | Admitting: *Deleted

## 2012-01-06 MED ORDER — CIPROFLOXACIN HCL 250 MG PO TABS: 250.0000 mg | ORAL_TABLET | Freq: Two times a day (BID) | ORAL | Status: DC

## 2012-01-06 MED ORDER — SULFAMETHOXAZOLE-TMP DS 800-160 MG PO TABS: 1.0000 | ORAL_TABLET | Freq: Two times a day (BID) | ORAL | Status: DC

## 2012-01-06 NOTE — Telephone Encounter (Signed)
Patient called to let us know that she is allergic to the cipro that was sent in for her. She says that the pharmacy recommended septra. Is it okay to call this in?

## 2012-01-06 NOTE — Telephone Encounter (Signed)
Rx for Septra sent to pharmacy, Cipro added to allergy list.

## 2012-01-06 NOTE — Telephone Encounter (Signed)
We can call in Septra DS tab po bid x 5 days.

## 2012-01-08 LAB — URINE CULTURE

## 2012-01-24 ENCOUNTER — Other Ambulatory Visit: Payer: Self-pay | Admitting: Internal Medicine

## 2012-02-27 ENCOUNTER — Other Ambulatory Visit: Payer: Self-pay | Admitting: Internal Medicine

## 2012-04-05 ENCOUNTER — Telehealth: Payer: Self-pay | Admitting: Internal Medicine

## 2012-04-05 ENCOUNTER — Other Ambulatory Visit (INDEPENDENT_AMBULATORY_CARE_PROVIDER_SITE_OTHER): Payer: 59

## 2012-04-05 DIAGNOSIS — R300 Dysuria: Secondary | ICD-10-CM

## 2012-04-05 DIAGNOSIS — R350 Frequency of micturition: Secondary | ICD-10-CM

## 2012-04-05 LAB — POCT URINALYSIS DIPSTICK
Bilirubin, UA: NEGATIVE
Ketones, UA: NEGATIVE
Spec Grav, UA: 1.015
pH, UA: 7

## 2012-04-05 NOTE — Telephone Encounter (Signed)
Patient advised via telephone, she is coming in to leave urine sample today.

## 2012-04-05 NOTE — Telephone Encounter (Signed)
Caller: [name]/Patient; Patient Name: Deborah Mcconnell; PCP: Ronna Polio (Adults only); Best Callback Phone Number: [phone]; Call regarding Urinary burning and frequency, onset 10-5.   All emergent symptoms ruled out per Urinary Symptoms protocol, see in 24 hours.  No same day or appointments within 24 hours.  Please follow up with Pt if MD would like urine sample or appointment.  Patient uses [facility], [address], [phone].

## 2012-04-27 ENCOUNTER — Other Ambulatory Visit: Payer: Self-pay | Admitting: Internal Medicine

## 2012-05-26 ENCOUNTER — Other Ambulatory Visit: Payer: Self-pay | Admitting: Internal Medicine

## 2012-05-26 MED ORDER — MELOXICAM 15 MG PO TABS: ORAL_TABLET | ORAL | Status: DC

## 2012-05-26 MED ORDER — ALENDRONATE SODIUM 70 MG PO TABS: ORAL_TABLET | ORAL | Status: DC

## 2012-05-26 MED ORDER — SIMVASTATIN 20 MG PO TABS: ORAL_TABLET | ORAL | Status: DC

## 2012-05-26 NOTE — Telephone Encounter (Signed)
Med filled.  

## 2012-05-26 NOTE — Telephone Encounter (Signed)
Pt is needing refills on Meloxicam, Simivastin, Alendronate Sodium. She uses [facility].

## 2012-06-04 ENCOUNTER — Ambulatory Visit (INDEPENDENT_AMBULATORY_CARE_PROVIDER_SITE_OTHER): Payer: 59 | Admitting: Adult Health

## 2012-06-04 ENCOUNTER — Ambulatory Visit (INDEPENDENT_AMBULATORY_CARE_PROVIDER_SITE_OTHER)
Admission: RE | Admit: 2012-06-04 | Discharge: 2012-06-04 | Disposition: A | Discharge status: Home / Self Care | Payer: 59 | Source: Ambulatory Visit | Attending: Adult Health | Admitting: Adult Health

## 2012-06-04 ENCOUNTER — Encounter: Payer: Self-pay | Admitting: Adult Health

## 2012-06-04 VITALS — BP 119/78 | HR 64 | Temp 98.1°F | Ht 70.0 in | Wt 176.0 lb

## 2012-06-04 DIAGNOSIS — M545 Low back pain, unspecified: Secondary | ICD-10-CM | POA: Insufficient documentation

## 2012-06-04 DIAGNOSIS — M546 Pain in thoracic spine: Secondary | ICD-10-CM

## 2012-06-04 DIAGNOSIS — M25559 Pain in unspecified hip: Secondary | ICD-10-CM

## 2012-06-04 MED ORDER — HYDROCODONE-ACETAMINOPHEN 5-500 MG PO TABS: 1.0000 | ORAL_TABLET | Freq: Four times a day (QID) | ORAL | Status: DC | PRN

## 2012-06-04 NOTE — Patient Instructions (Addendum)
You can take Vicodin 1 tablet every 6 hours if needed for pain. This medication may make you sleepy/drowsy. You can go to the [facility] [employer].   Consider Physical Therapy.   Call if no improvement within 1-2 weeks.

## 2012-06-04 NOTE — Progress Notes (Signed)
Subjective:    Patient ID: Deborah Mcconnell, female    DOB: 1950/08/10, 61 y.o.   MRN: [id number]  HPI  Pt is a 61 y/o female with PMHx of HLD, osteopenia, arthritis who presents to clinic today with c/o pain that began in L. medial aspect LE progressing to the lateral aspect of same leg approximately 1 week ago. This pain has subsided with the use of ibuprofen. Yesterday, she began to have low back pain which radiates to the anterior L. Hip. Pain is reported as dull; however, movement often produces sharp pain. Also reports area in thoracic spine which has also been causing her pain/discomfort. Pt denies any activity precipitating these events.  Current Outpatient Prescriptions on File Prior to Visit  Medication Sig Dispense Refill  . alendronate (FOSAMAX) 70 MG tablet Take with a full glass of water on an empty stomach. DO NOT HOLD ALENDRONATE  4 tablet  5  . aspirin EC 81 MG tablet Take 81 mg by mouth as needed.        . Azelaic Acid (FINACEA) 15 % cream Apply topically. After skin is thoroughly washed and patted dry, gently but thoroughly massage a thin film of azelaic acid cream into the affected area twice daily, in the morning and evening.       Marland Kitchen b complex vitamins tablet Take 1 tablet by mouth daily.      . cholecalciferol (VITAMIN D) 1000 UNITS tablet Take 1,000 Units by mouth daily.      . clindamycin-benzoyl peroxide (BENZACLIN) gel Apply topically 2 (two) times daily. Use sparingly, as needed  25 g  0  . fluticasone (VERAMYST) 27.5 MCG/SPRAY nasal spray Place 2 sprays into the nose daily.      . meclizine (ANTIVERT) 25 MG tablet Take 1 tablet by mouth as needed.      . metroNIDAZOLE (METROCREAM) 0.75 % cream APPLY TOPICALLY  TO AFFECTED AREA(S) TWICE DAILY  45 g  0  . simvastatin (ZOCOR) 20 MG tablet TAKE ONE TABLET BY MOUTH EVERY DAY  90 tablet  3  . vitamin C (ASCORBIC ACID) 500 MG tablet Take 500 mg by mouth daily.      Marland Kitchen doxycycline (VIBRAMYCIN) 50 MG capsule TAKE ONE CAPSULE BY  MOUTH TWICE DAILY  60 capsule  0  . meloxicam (MOBIC) 15 MG tablet TAKE ONE TABLET BY MOUTH EVERY DAY  90 tablet  3  . sulfamethoxazole-trimethoprim (BACTRIM DS) 800-160 MG per tablet Take 1 tablet by mouth 2 (two) times daily.  10 tablet  0    Review of Systems  Constitutional: Negative.   Respiratory: Negative for cough, chest tightness and shortness of breath.   Cardiovascular: Negative.   Genitourinary: Negative for dysuria, urgency and flank pain.  Musculoskeletal: Positive for back pain. Negative for joint swelling.  Neurological: Negative.   Psychiatric/Behavioral: Negative.      BP 119/78  Pulse 64  Temp 98.1 F (36.7 C) (Oral)  Ht 5\' 10"  (1.778 m)  Wt 176 lb (79.833 kg)  BMI 25.25 kg/m2  SpO2 100%     Objective:   Physical Exam  Constitutional: She appears well-developed and well-nourished. No distress.  Musculoskeletal: She exhibits tenderness.       Pain thoracic spine at level T7, T8. Also pain in lumbar region and towards L hip  Skin: Skin is warm and dry. No rash noted.  Psychiatric: She has a normal mood and affect. Her behavior is normal. Judgment and thought content normal.  Assessment & Plan:

## 2012-06-04 NOTE — Assessment & Plan Note (Addendum)
Pain radiating to L. anterior hip. Send patient for films lumbar and hip. Consider physical therapy. Vicodin ordered short term for pain.

## 2012-06-04 NOTE — Assessment & Plan Note (Signed)
Pain on palpation to thoracic spine at area of T7, T8. Will send patient for xray. Prescribed Vicodin short term. Consider referral to physical therapy.

## 2012-06-08 ENCOUNTER — Telehealth: Payer: Self-pay | Admitting: *Deleted

## 2012-06-08 NOTE — Telephone Encounter (Signed)
Left message on patients home phone to call office for results.

## 2012-06-08 NOTE — Telephone Encounter (Signed)
Patient returning your call..  Please call her on her cell phone.

## 2012-06-10 NOTE — Telephone Encounter (Signed)
Results given to patien

## 2012-06-30 DIAGNOSIS — C50912 Malignant neoplasm of unspecified site of left female breast: Secondary | ICD-10-CM

## 2012-06-30 HISTORY — DX: Malignant neoplasm of unspecified site of left female breast: C50.912

## 2012-06-30 HISTORY — PX: OTHER SURGICAL HISTORY: SHX169

## 2012-07-22 ENCOUNTER — Encounter: Payer: Self-pay | Admitting: Adult Health

## 2012-07-22 ENCOUNTER — Ambulatory Visit (INDEPENDENT_AMBULATORY_CARE_PROVIDER_SITE_OTHER): Payer: 59 | Admitting: Adult Health

## 2012-07-22 VITALS — BP 110/70 | HR 71 | Temp 98.5°F | Ht 72.0 in | Wt 176.0 lb

## 2012-07-22 DIAGNOSIS — N390 Urinary tract infection, site not specified: Secondary | ICD-10-CM | POA: Insufficient documentation

## 2012-07-22 LAB — POCT URINALYSIS DIPSTICK
Bilirubin, UA: NEGATIVE
Glucose, UA: NEGATIVE
Ketones, UA: NEGATIVE
Nitrite, UA: NEGATIVE
Protein, UA: NEGATIVE
Spec Grav, UA: <=1.005
Urobilinogen, UA: 0.2
pH, UA: 5.5

## 2012-07-22 MED ORDER — SULFAMETHOXAZOLE-TMP DS 800-160 MG PO TABS: 1.0000 | ORAL_TABLET | Freq: Two times a day (BID) | ORAL | Status: DC

## 2012-07-22 NOTE — Progress Notes (Signed)
Subjective:    Patient ID: Deborah Mcconnell, female    DOB: 03-09-1951, 62 y.o.   MRN: [id number]  HPI  Patient is a 62 y/o female who presents to clinic with c/o urgency, dysuria and low back pain. Malodorous urine. Symptoms began this morning. Denies hematuria, fever, chills.  Current Outpatient Prescriptions on File Prior to Visit  Medication Sig Dispense Refill  . alendronate (FOSAMAX) 70 MG tablet Take with a full glass of water on an empty stomach. DO NOT HOLD ALENDRONATE  4 tablet  5  . aspirin EC 81 MG tablet Take 81 mg by mouth as needed.        . Azelaic Acid (FINACEA) 15 % cream Apply topically. After skin is thoroughly washed and patted dry, gently but thoroughly massage a thin film of azelaic acid cream into the affected area twice daily, in the morning and evening.       Marland Kitchen b complex vitamins tablet Take 1 tablet by mouth daily.      . cholecalciferol (VITAMIN D) 1000 UNITS tablet Take 1,000 Units by mouth daily.      . clindamycin-benzoyl peroxide (BENZACLIN) gel Apply topically 2 (two) times daily. Use sparingly, as needed  25 g  0  . fluticasone (VERAMYST) 27.5 MCG/SPRAY nasal spray Place 2 sprays into the nose daily.      Marland Kitchen ibuprofen (ADVIL,MOTRIN) 200 MG tablet Take 600 mg by mouth 2 (two) times daily. prn      . meloxicam (MOBIC) 15 MG tablet TAKE ONE TABLET BY MOUTH EVERY DAY  90 tablet  3  . metroNIDAZOLE (METROCREAM) 0.75 % cream APPLY TOPICALLY  TO AFFECTED AREA(S) TWICE DAILY  45 g  0  . simvastatin (ZOCOR) 20 MG tablet TAKE ONE TABLET BY MOUTH EVERY DAY  90 tablet  3  . vitamin C (ASCORBIC ACID) 500 MG tablet Take 500 mg by mouth daily.      Marland Kitchen doxycycline (VIBRAMYCIN) 50 MG capsule TAKE ONE CAPSULE BY MOUTH TWICE DAILY  60 capsule  0  . HYDROcodone-acetaminophen (VICODIN) 5-500 MG per tablet Take 1 tablet by mouth every 6 (six) hours as needed for pain.  30 tablet  0  . meclizine (ANTIVERT) 25 MG tablet Take 1 tablet by mouth as needed.         Review of Systems    Constitutional: Negative for fever and chills.  Respiratory: Negative.   Cardiovascular: Negative.   Gastrointestinal: Negative.   Genitourinary: Positive for dysuria, urgency, frequency and flank pain. Negative for pelvic pain.  Skin: Negative.   Neurological: Negative.   Psychiatric/Behavioral: Negative.    BP 110/70  Pulse 71  Temp 98.5 F (36.9 C) (Oral)  Ht 6' (1.829 m)  Wt 176 lb (79.833 kg)  BMI 23.87 kg/m2  SpO2 100%     Objective:   Physical Exam  Constitutional: She is oriented to person, place, and time. She appears well-developed and well-nourished. No distress.  Cardiovascular: Normal rate, regular rhythm and normal heart sounds.  Exam reveals no gallop.   No murmur heard. Pulmonary/Chest: Effort normal and breath sounds normal.  Abdominal: Soft. Bowel sounds are normal. She exhibits no mass. There is no tenderness. There is no rebound and no guarding.  Musculoskeletal: Normal range of motion.  Neurological: She is alert and oriented to person, place, and time.  Skin: Skin is warm and dry.  Psychiatric: She has a normal mood and affect. Her behavior is normal. Judgment and thought content normal.  Assessment & Plan:

## 2012-07-22 NOTE — Patient Instructions (Addendum)
  Please start your antibiotic today.  You can take an OTC medication called AZO or Urostat with the active ingredient (Pyridium)  Continue to drink plenty of fluids.  Please call if your symptoms are not improved within 3 days.

## 2012-07-22 NOTE — Assessment & Plan Note (Signed)
Symptoms consistent with UTI - dysuria, urgency, malodorous urine. UA dip + for leukocytes. Urine culture sent. Patient started on Bactrim. May take OTC Urostat or AZO x 3 days for the urinary symptoms. RTC if no improvement by Monday.

## 2012-07-24 LAB — URINE CULTURE
Colony Count: NO GROWTH
Organism ID, Bacteria: NO GROWTH

## 2012-08-14 ENCOUNTER — Other Ambulatory Visit: Payer: Self-pay

## 2012-10-27 ENCOUNTER — Encounter: Payer: Self-pay | Admitting: Internal Medicine

## 2012-10-27 ENCOUNTER — Ambulatory Visit (INDEPENDENT_AMBULATORY_CARE_PROVIDER_SITE_OTHER): Payer: BC Managed Care – PPO | Admitting: Internal Medicine

## 2012-10-27 ENCOUNTER — Other Ambulatory Visit (HOSPITAL_COMMUNITY)
Admission: RE | Admit: 2012-10-27 | Discharge: 2012-10-27 | Disposition: A | Discharge status: Home / Self Care | Payer: BC Managed Care – PPO | Source: Ambulatory Visit | Attending: Internal Medicine | Admitting: Internal Medicine

## 2012-10-27 VITALS — BP 120/70 | HR 69 | Temp 98.4°F | Ht 71.5 in | Wt 173.0 lb

## 2012-10-27 DIAGNOSIS — Z1151 Encounter for screening for human papillomavirus (HPV): Secondary | ICD-10-CM | POA: Insufficient documentation

## 2012-10-27 DIAGNOSIS — Z1211 Encounter for screening for malignant neoplasm of colon: Secondary | ICD-10-CM

## 2012-10-27 DIAGNOSIS — Z0000 Encounter for general adult medical examination without abnormal findings: Secondary | ICD-10-CM

## 2012-10-27 DIAGNOSIS — Z01419 Encounter for gynecological examination (general) (routine) without abnormal findings: Secondary | ICD-10-CM | POA: Insufficient documentation

## 2012-10-27 LAB — HM PAP SMEAR: HM Pap smear: NEGATIVE

## 2012-10-27 MED ORDER — MECLIZINE HCL 25 MG PO TABS: 25.0000 mg | ORAL_TABLET | Freq: Three times a day (TID) | ORAL | Status: DC | PRN

## 2012-10-27 MED ORDER — ALENDRONATE SODIUM 70 MG PO TABS: ORAL_TABLET | ORAL | Status: DC

## 2012-10-27 NOTE — Assessment & Plan Note (Signed)
General medical exam including breast and pelvic exam normal today. PAP is pending. Mammogram ordered, pending at [facility]. Will check labs including CMP, CBC, lipids. Discussed supplemental medications. Encouraged adequate dietary intake of calcium, 1200-1500mg  per day and encouraged supplemental Vit D 1000units daily. Encouraged continued efforts at healthy diet, low in saturated fat and high in fiber, and regular physical activity, with goal of 3x per week.

## 2012-10-27 NOTE — Progress Notes (Signed)
Subjective:    Patient ID: Deborah Mcconnell, female    DOB: 04-10-1951, 62 y.o.   MRN: [id number]  HPI 62YO female presents for annual exam. Doing well. Recently had some diarrhea and abdominal cramping for about 1-2 weeks intermittently, however this has resolved. Trying to follow a healthy diet and get regular exercise. Continues to work as a Runner, broadcasting/film/video.  Stopped taking supplements and stopped cholesterol medication.  Outpatient Encounter Prescriptions as of 10/27/2012  Medication Sig Dispense Refill  . alendronate (FOSAMAX) 70 MG tablet Take with a full glass of water on an empty stomach.  12 tablet  4  . aspirin EC 81 MG tablet Take 81 mg by mouth as needed.        . cholecalciferol (VITAMIN D) 1000 UNITS tablet Take 1,000 Units by mouth daily.      Marland Kitchen ibuprofen (ADVIL,MOTRIN) 200 MG tablet Take 600 mg by mouth 2 (two) times daily. prn      . meclizine (ANTIVERT) 25 MG tablet Take 1 tablet (25 mg total) by mouth 3 (three) times daily as needed.  90 tablet  6   No facility-administered encounter medications on file as of 10/27/2012.   BP 120/70  Pulse 69  Temp(Src) 98.4 F (36.9 C) (Oral)  Ht 5' 11.5" (1.816 m)  Wt 173 lb (78.472 kg)  BMI 23.79 kg/m2  SpO2 97%  Review of Systems  Constitutional: Negative for fever, chills, appetite change, fatigue and unexpected weight change.  HENT: Negative for ear pain, congestion, sore throat, trouble swallowing, neck pain, voice change and sinus pressure.   Eyes: Negative for visual disturbance.  Respiratory: Negative for cough, shortness of breath, wheezing and stridor.   Cardiovascular: Negative for chest pain, palpitations and leg swelling.  Gastrointestinal: Negative for nausea, vomiting, abdominal pain, diarrhea, constipation, blood in stool, abdominal distention and anal bleeding.  Genitourinary: Negative for dysuria and flank pain.  Musculoskeletal: Negative for myalgias, arthralgias and gait problem.  Skin: Negative for color change and  rash.  Neurological: Negative for dizziness and headaches.  Hematological: Negative for adenopathy. Does not bruise/bleed easily.  Psychiatric/Behavioral: Negative for suicidal ideas, sleep disturbance and dysphoric mood. The patient is not nervous/anxious.        Objective:   Physical Exam  Constitutional: She is oriented to person, place, and time. She appears well-developed and well-nourished. No distress.  HENT:  Head: Normocephalic and atraumatic.  Right Ear: External ear normal.  Left Ear: External ear normal.  Nose: Nose normal.  Mouth/Throat: Oropharynx is clear and moist. No oropharyngeal exudate.  Eyes: Conjunctivae are normal. Pupils are equal, round, and reactive to light. Right eye exhibits no discharge. Left eye exhibits no discharge. No scleral icterus.  Neck: Normal range of motion. Neck supple. No tracheal deviation present. No thyromegaly present.  Cardiovascular: Normal rate, regular rhythm, normal heart sounds and intact distal pulses.  Exam reveals no gallop and no friction rub.   No murmur heard. Pulmonary/Chest: Effort normal and breath sounds normal. No respiratory distress. She has no wheezes. She has no rales. She exhibits no tenderness.  Abdominal: Soft. Bowel sounds are normal. She exhibits no distension and no mass. There is no tenderness. There is no rebound and no guarding.  Genitourinary: Rectum normal, vagina normal and uterus normal. No breast swelling, tenderness, discharge or bleeding. Pelvic exam was performed with patient supine. There is no rash, tenderness or lesion on the right labia. There is no rash, tenderness or lesion on the left labia. Uterus is not enlarged  and not tender. Cervix exhibits no motion tenderness, no discharge and no friability. Right adnexum displays no mass, no tenderness and no fullness. Left adnexum displays no mass, no tenderness and no fullness. No erythema or tenderness around the vagina. No vaginal discharge found.   Musculoskeletal: Normal range of motion. She exhibits no edema and no tenderness.  Lymphadenopathy:    She has no cervical adenopathy.  Neurological: She is alert and oriented to person, place, and time. No cranial nerve deficit. She exhibits normal muscle tone. Coordination normal.  Skin: Skin is warm and dry. No rash noted. She is not diaphoretic. No erythema. No pallor.  Psychiatric: She has a normal mood and affect. Her behavior is normal. Judgment and thought content normal.          Assessment & Plan:

## 2012-10-28 ENCOUNTER — Telehealth: Payer: Self-pay | Admitting: *Deleted

## 2012-10-28 LAB — COMPREHENSIVE METABOLIC PANEL
Albumin: 4 g/dL (ref 3.5–5.2)
CO2: 28 mEq/L (ref 19–32)
Calcium: 8.9 mg/dL (ref 8.4–10.5)
Chloride: 105 mEq/L (ref 96–112)
GFR: 99.87 mL/min (ref >60.00)
Glucose, Bld: 89 mg/dL (ref 70–99)
Potassium: 3.7 mEq/L (ref 3.5–5.1)
Sodium: 136 mEq/L (ref 135–145)
Total Bilirubin: 0.5 mg/dL (ref 0.3–1.2)
Total Protein: 6.6 g/dL (ref 6.0–8.3)

## 2012-10-28 LAB — CBC WITH DIFFERENTIAL/PLATELET
Basophils Absolute: 0.1 10*3/uL (ref 0.0–0.1)
Eosinophils Relative: 1.7 % (ref 0.0–5.0)
Lymphocytes Relative: 22.9 % (ref 12.0–46.0)
Monocytes Relative: 9.2 % (ref 3.0–12.0)
Neutrophils Relative %: 65.1 % (ref 43.0–77.0)
Platelets: 242 10*3/uL (ref 150.0–400.0)
RDW: 12.9 % (ref 11.5–14.6)
WBC: 6 10*3/uL (ref 4.5–10.5)

## 2012-10-28 LAB — LIPID PANEL
Cholesterol: 168 mg/dL (ref 0–200)
LDL Cholesterol: 97 mg/dL (ref 0–99)
Triglycerides: 65 mg/dL (ref 0.0–149.0)
VLDL: 13 mg/dL (ref 0.0–40.0)

## 2012-10-28 MED ORDER — ALENDRONATE SODIUM 70 MG PO TABS: 70.0000 mg | ORAL_TABLET | ORAL | Status: DC

## 2012-10-28 NOTE — Telephone Encounter (Signed)
Re-faxed.

## 2012-10-28 NOTE — Telephone Encounter (Signed)
Pharmacy Note:  Alendronate 70 mg  Please re-send Rx with the directions

## 2012-11-03 ENCOUNTER — Encounter: Payer: Self-pay | Admitting: Internal Medicine

## 2012-11-05 ENCOUNTER — Telehealth: Payer: Self-pay | Admitting: *Deleted

## 2012-11-05 NOTE — Telephone Encounter (Signed)
Attempted to call pt to make sure she does not have any questions or needs. No answer. Left message on machine.

## 2012-11-05 NOTE — Telephone Encounter (Signed)
Dr. Earlene Plater, radiologist from [facility] called, to inform Dr. Dan Humphreys that pt had a biopsy of left breast earlier this week, and results came back malignant. Pt has been notified by him and appointment scheduled with surgeon at [facility]. Notes will be faxed over.

## 2012-11-11 ENCOUNTER — Telehealth: Payer: Self-pay | Admitting: Internal Medicine

## 2012-11-11 ENCOUNTER — Encounter: Payer: Self-pay | Admitting: Internal Medicine

## 2012-11-11 NOTE — Telephone Encounter (Signed)
Follow up path report left breast - invasive adenocarcinoma.

## 2012-11-19 DIAGNOSIS — C50919 Malignant neoplasm of unspecified site of unspecified female breast: Secondary | ICD-10-CM | POA: Insufficient documentation

## 2012-11-20 ENCOUNTER — Encounter: Payer: Self-pay | Admitting: Internal Medicine

## 2012-11-26 ENCOUNTER — Encounter: Payer: Self-pay | Admitting: Internal Medicine

## 2012-12-02 ENCOUNTER — Encounter: Payer: Self-pay | Admitting: Internal Medicine

## 2012-12-02 ENCOUNTER — Ambulatory Visit (INDEPENDENT_AMBULATORY_CARE_PROVIDER_SITE_OTHER): Payer: BC Managed Care – PPO | Admitting: Internal Medicine

## 2012-12-02 VITALS — BP 120/72 | HR 78 | Temp 97.8°F | Ht 71.0 in | Wt 169.0 lb

## 2012-12-02 DIAGNOSIS — C50912 Malignant neoplasm of unspecified site of left female breast: Secondary | ICD-10-CM

## 2012-12-02 DIAGNOSIS — C50919 Malignant neoplasm of unspecified site of unspecified female breast: Secondary | ICD-10-CM

## 2012-12-02 NOTE — Progress Notes (Signed)
Subjective:    Patient ID: Deborah Mcconnell, female    DOB: 12-15-1950, 62 y.o.   MRN: [id number]  HPI 61 year old female presents for discussion after recent diagnosis of invasive ductal carcinoma of the left breast. She was diagnosed after having abnormal mammogram findings. 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 She is scheduled for lumpectomy in one week. She has been debating lumpectomy and radiation therapy versus mastectomy. She is unclear why her oncologist did not offer mastectomy is an option. This was offered to her as an option by the radiation oncologist. He discussed with her that both treatment plans are found to have similar outcomes. The radiation oncologist discussed with her the potential risk of radiation therapy including heart disease. She is trying to obtain more information to make an informed decision. She reports some anxiety about his decision but otherwise reports she is feeling well.  Outpatient Encounter Prescriptions as of 12/02/2012  Medication Sig Dispense Refill  . alendronate (FOSAMAX) 70 MG tablet Take 1 tablet (70 mg total) by mouth every 7 (seven) days. Take with a full glass of water on an empty stomach.  12 tablet  4  . aspirin EC 81 MG tablet Take 81 mg by mouth as needed.        . Azelaic Acid (FINACEA) 15 % cream Apply topically. After skin is thoroughly washed and patted dry, gently but thoroughly massage a thin film of azelaic acid cream into the affected area twice daily, in the morning and evening.       . cholecalciferol (VITAMIN D) 1000 UNITS tablet Take 1,000 Units by mouth daily.      Marland Kitchen ibuprofen (ADVIL,MOTRIN) 200 MG tablet Take 600 mg by mouth 2 (two) times daily. prn      . meclizine (ANTIVERT) 25 MG tablet Take 1 tablet (25 mg total) by mouth 3 (three) times daily as needed.  90 tablet  6   No facility-administered encounter medications on file as of  12/02/2012.   BP 120/72  Pulse 78  Temp(Src) 97.8 F (36.6 C) (Oral)  Ht 5\' 11"  (1.803 m)  Wt 169 lb (76.658 kg)  BMI 23.58 kg/m2  SpO2 97%  Review of Systems  Constitutional: Negative for fever, chills, appetite change, fatigue and unexpected weight change.  HENT: Negative for ear pain, congestion, sore throat, trouble swallowing, neck pain, voice change and sinus pressure.   Eyes: Negative for visual disturbance.  Respiratory: Negative for cough, shortness of breath, wheezing and stridor.   Cardiovascular: Negative for chest pain, palpitations and leg swelling.  Gastrointestinal: Negative for nausea, vomiting, abdominal pain, diarrhea, constipation, blood in stool, abdominal distention and anal bleeding.  Genitourinary: Negative for dysuria and flank pain.  Musculoskeletal: Negative for myalgias, arthralgias and gait problem.  Skin: Negative for color change and rash.  Neurological: Negative for dizziness and headaches.  Hematological: Negative for adenopathy. Does not bruise/bleed easily.  Psychiatric/Behavioral: Negative for suicidal ideas, sleep disturbance and dysphoric mood. The patient is nervous/anxious.        Objective:   Physical Exam  Constitutional: She is oriented to person, place, and time. She appears well-developed and well-nourished. No distress.  HENT:  Head: Normocephalic and atraumatic.  Right Ear: External ear normal.  Left Ear: External ear normal.  Nose: Nose normal.  Mouth/Throat: Oropharynx is clear and moist.  Eyes: Conjunctivae are normal. Pupils are equal, round, and reactive to light. Right eye exhibits  no discharge. Left eye exhibits no discharge. No scleral icterus.  Neck: Normal range of motion. Neck supple. No tracheal deviation present. No thyromegaly present.  Pulmonary/Chest: Effort normal. No accessory muscle usage. Not tachypneic. She has no decreased breath sounds. She has no rhonchi.  Musculoskeletal: Normal range of motion. She exhibits  no edema and no tenderness.  Lymphadenopathy:    She has no cervical adenopathy.  Neurological: She is alert and oriented to person, place, and time. No cranial nerve deficit. She exhibits normal muscle tone. Coordination normal.  Skin: Skin is warm and dry. No rash noted. She is not diaphoretic. No erythema. No pallor.  Psychiatric: Her behavior is normal. Judgment and thought content normal. Her mood appears anxious.          Assessment & Plan:

## 2012-12-02 NOTE — Assessment & Plan Note (Signed)
Invasive ductal carcinoma of the left breast ER positive PR negative HER-2 negative. Scheduled for lumpectomy next week but having some concerns about radiation therapy. Encouraged her to followup with her oncologist and discuss potential risk and benefits of each option. Reviewed today equivalent outcomes data for both options based on existing literature. Reviewed potential risk of radiation therapy as she had discussed with her radiation oncologist. Offered general support today.  Over of which >50% spent in face-to-face contact with patient discussing plan of care

## 2012-12-03 ENCOUNTER — Encounter: Payer: Self-pay | Admitting: Internal Medicine

## 2012-12-17 ENCOUNTER — Emergency Department: Payer: Self-pay | Admitting: Unknown Physician Specialty

## 2012-12-17 LAB — CBC
HCT: 41.7 % (ref 35.0–47.0)
HGB: 14.5 g/dL (ref 12.0–16.0)
MCHC: 34.9 g/dL (ref 32.0–36.0)
Platelet: 237 10*3/uL (ref 150–440)
WBC: 5.5 10*3/uL (ref 3.6–11.0)

## 2012-12-17 LAB — COMPREHENSIVE METABOLIC PANEL
Albumin: 3.8 g/dL (ref 3.4–5.0)
Anion Gap: 6 — ABNORMAL LOW (ref 7–16)
BUN: 10 mg/dL (ref 7–18)
Bilirubin,Total: 0.3 mg/dL (ref 0.2–1.0)
Chloride: 109 mmol/L — ABNORMAL HIGH (ref 98–107)
Co2: 27 mmol/L (ref 21–32)
EGFR (African American): >60
Glucose: 116 mg/dL — ABNORMAL HIGH (ref 65–99)
Potassium: 3.6 mmol/L (ref 3.5–5.1)
SGPT (ALT): 23 U/L (ref 12–78)
Sodium: 142 mmol/L (ref 136–145)

## 2013-01-05 ENCOUNTER — Encounter: Payer: Self-pay | Admitting: Internal Medicine

## 2013-01-10 ENCOUNTER — Telehealth: Payer: Self-pay | Admitting: Internal Medicine

## 2013-01-10 NOTE — Telephone Encounter (Signed)
The patient is wanting a referral for a second opinion for her breast cancer. [facility] is wanting to start treatments and her insurance is wanting her to have a second opinion prior to treatment.

## 2013-01-13 ENCOUNTER — Telehealth: Payer: Self-pay | Admitting: *Deleted

## 2013-01-13 MED ORDER — METRONIDAZOLE 0.75 % EX CREA: TOPICAL_CREAM | CUTANEOUS | Status: DC

## 2013-01-13 NOTE — Telephone Encounter (Signed)
Refill Request  Metronidazole 0.75% cream  #45  Apply cream topically to affected area(s) twice daily

## 2013-01-13 NOTE — Telephone Encounter (Signed)
Rx sent to pharmacy   

## 2013-01-13 NOTE — Telephone Encounter (Signed)
Information has been forwarded to Dr. Dan Humphreys

## 2013-01-16 NOTE — Telephone Encounter (Signed)
Fine to set up second referral for another opinion, perhaps at [facility]. I would recommend [facility].

## 2013-01-18 NOTE — Telephone Encounter (Signed)
LVM for patient to call our office  

## 2013-01-18 NOTE — Telephone Encounter (Signed)
Spoke with [place] @ [facility] office, she has taken the information and I have faxed over all records to [phone] for their office to review and contact our pt.

## 2013-01-18 NOTE — Telephone Encounter (Signed)
Can you please let pt know this?

## 2013-01-18 NOTE — Telephone Encounter (Signed)
The patient returned my call and stated that she has already made decisions to start radiation and has started. She isn't angry, but seems upset. I informed her that [place] not [place] to work until today. She states she was assured by the person she spoke with she would have a call back from the nurse with answers to her question/s, and I am the first person who has even called her to let her know that a 2nd opinion was even possible. I apologized to the patient countless times but I don't think it has helped. I told her that maybe she should has ask for the referral coordinator and she states she did and was given a flat "no". She doesn't seem happy with the way this referral was taken care of.

## 2013-01-18 NOTE — Telephone Encounter (Signed)
I have left a message with Gearldine Bienenstock at the [facility] to return my call.    Phone # [phone]

## 2013-02-07 ENCOUNTER — Encounter: Payer: Self-pay | Admitting: Internal Medicine

## 2013-02-25 ENCOUNTER — Ambulatory Visit (INDEPENDENT_AMBULATORY_CARE_PROVIDER_SITE_OTHER): Payer: BC Managed Care – PPO | Admitting: Internal Medicine

## 2013-02-25 ENCOUNTER — Ambulatory Visit
Admission: RE | Admit: 2013-02-25 | Discharge: 2013-02-25 | Disposition: A | Discharge status: Home / Self Care | Payer: BC Managed Care – PPO | Source: Ambulatory Visit | Attending: Internal Medicine | Admitting: Internal Medicine

## 2013-02-25 ENCOUNTER — Encounter: Payer: Self-pay | Admitting: Internal Medicine

## 2013-02-25 VITALS — BP 120/74 | HR 76 | Temp 98.3°F | Wt 156.0 lb

## 2013-02-25 DIAGNOSIS — C50912 Malignant neoplasm of unspecified site of left female breast: Secondary | ICD-10-CM

## 2013-02-25 DIAGNOSIS — R634 Abnormal weight loss: Secondary | ICD-10-CM

## 2013-02-25 DIAGNOSIS — R109 Unspecified abdominal pain: Secondary | ICD-10-CM

## 2013-02-25 DIAGNOSIS — C50919 Malignant neoplasm of unspecified site of unspecified female breast: Secondary | ICD-10-CM

## 2013-02-25 LAB — COMPREHENSIVE METABOLIC PANEL
Albumin: 4 g/dL (ref 3.5–5.2)
Alkaline Phosphatase: 42 U/L (ref 39–117)
BUN: 7 mg/dL (ref 6–23)
CO2: 27 mEq/L (ref 19–32)
GFR: 101.59 mL/min (ref >60.00)
Glucose, Bld: 112 mg/dL — ABNORMAL HIGH (ref 70–99)
Potassium: 3.5 mEq/L (ref 3.5–5.1)
Total Bilirubin: 0.6 mg/dL (ref 0.3–1.2)
Total Protein: 6.6 g/dL (ref 6.0–8.3)

## 2013-02-25 LAB — CBC WITH DIFFERENTIAL/PLATELET
Basophils Absolute: 0 10*3/uL (ref 0.0–0.1)
Eosinophils Relative: 1.4 % (ref 0.0–5.0)
HCT: 41.2 % (ref 36.0–46.0)
Lymphocytes Relative: 14.1 % (ref 12.0–46.0)
Lymphs Abs: 0.6 10*3/uL — ABNORMAL LOW (ref 0.7–4.0)
Monocytes Relative: 7.8 % (ref 3.0–12.0)
Platelets: 230 10*3/uL (ref 150.0–400.0)
RDW: 13.2 % (ref 11.5–14.6)
WBC: 4.3 10*3/uL — ABNORMAL LOW (ref 4.5–10.5)

## 2013-02-25 LAB — TSH: TSH: 1.83 u[IU]/mL (ref 0.35–5.50)

## 2013-02-25 LAB — T4, FREE: Free T4: 1.21 ng/dL (ref 0.60–1.60)

## 2013-02-25 LAB — LIPASE: Lipase: 35 U/L (ref 11.0–59.0)

## 2013-02-25 MED ORDER — IOHEXOL 300 MG/ML  SOLN
30.0000 mL | Freq: Once | INTRAMUSCULAR | Status: AC | PRN
  Administered 2013-02-25: 30 mL via ORAL

## 2013-02-25 MED ORDER — IOHEXOL 300 MG/ML  SOLN: 100.0000 mL | Freq: Once | INTRAMUSCULAR | Status: AC | PRN

## 2013-02-25 NOTE — Assessment & Plan Note (Signed)
Reviewed recent notes from [facility] including recent labs which were normal. Exam is remarkable for slight erythema at site of radiation consistent with superficial radiation burn. Patient will continue to apply hydrocortisone as needed. Will follow.

## 2013-02-25 NOTE — Assessment & Plan Note (Signed)
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 Will get urine for 5-HIAA and CT abdomen and pelvis.

## 2013-02-25 NOTE — Progress Notes (Signed)
Subjective:    Patient ID: Deborah Mcconnell, female    DOB: 1950-08-27, 62 y.o.   MRN: [id number]  HPI 62 year old female with history of breast cancer and the left breast status post lumpectomy and recent radiation therapy presents for acute visit complaining of general malaise and weight loss. She reports that even prior to diagnosis of breast cancer she had developed intermittent episodes of watery diarrhea and gradual weight loss. After completing treatment for breast cancer, symptoms have gotten progressively worse. She is having several episodes of watery, nonbloody diarrhea throughout the day and night. This is keeping her from sleeping. She reports occasional abdominal cramping and periumbilical abdominal pain. She also occasionally has flushing prior to bowel movements. She has lost over 13 pounds since her last visit here in June 2014. She reports a very poor appetite. She has nausea which limits her ability to keep. She has not had any vomiting. She has been trying to sip on liquids and soups. Notably, she also recently started Femara, however this dictation was started only one week ago after onset of current symptoms. She is scheduled for colonoscopy later this month.  Outpatient Encounter Prescriptions as of 02/25/2013  Medication Sig Dispense Refill  . alendronate (FOSAMAX) 70 MG tablet Take 1 tablet (70 mg total) by mouth every 7 (seven) days. Take with a full glass of water on an empty stomach.  12 tablet  4  . aspirin EC 81 MG tablet Take 81 mg by mouth as needed.        . Azelaic Acid (FINACEA) 15 % cream Apply topically. After skin is thoroughly washed and patted dry, gently but thoroughly massage a thin film of azelaic acid cream into the affected area twice daily, in the morning and evening.       . calcium carbonate (CALCIUM 600) 600 MG TABS tablet Take by mouth. Take 1,200 mg by mouth.      . cholecalciferol (VITAMIN D) 1000 UNITS tablet Take 1,000 Units by mouth daily.      .  fluticasone (FLONASE) 50 MCG/ACT nasal spray 2 sprays. Place 2 sprays into both nostrils daily.      Marland Kitchen letrozole (FEMARA) 2.5 MG tablet Take by mouth. Take 1 tablet (2.5 mg total) by mouth daily.      . meclizine (ANTIVERT) 25 MG tablet Take 1 tablet (25 mg total) by mouth 3 (three) times daily as needed.  90 tablet  6  . metroNIDAZOLE (METROCREAM) 0.75 % cream APPLY TOPICALLY  TO AFFECTED AREA(S) TWICE DAILY  45 g  0  . ibuprofen (ADVIL,MOTRIN) 200 MG tablet Take 600 mg by mouth 2 (two) times daily. prn      . meloxicam (MOBIC) 15 MG tablet as needed.      . metroNIDAZOLE (METROCREAM) 0.75 % cream daily.       No facility-administered encounter medications on file as of 02/25/2013.   BP 120/74  Pulse 76  Temp(Src) 98.3 F (36.8 C) (Oral)  Wt 156 lb (70.761 kg)  BMI 21.77 kg/m2  SpO2 98%   Review of Systems  Constitutional: Negative for fever, chills, appetite change, fatigue and unexpected weight change.  HENT: Negative for ear pain, congestion, sore throat, trouble swallowing, neck pain, voice change and sinus pressure.   Eyes: Negative for visual disturbance.  Respiratory: Negative for cough, shortness of breath, wheezing and stridor.   Cardiovascular: Negative for chest pain, palpitations and leg swelling.  Gastrointestinal: Negative for nausea, vomiting, abdominal pain, diarrhea, constipation, blood in stool,  abdominal distention and anal bleeding.  Genitourinary: Negative for dysuria and flank pain.  Musculoskeletal: Negative for myalgias, arthralgias and gait problem.  Skin: Negative for color change and rash.  Neurological: Negative for dizziness and headaches.  Hematological: Negative for adenopathy. Does not bruise/bleed easily.  Psychiatric/Behavioral: Negative for suicidal ideas, sleep disturbance and dysphoric mood. The patient is not nervous/anxious.        Objective:   Physical Exam  Constitutional: She is oriented to person, place, and time. She appears  well-developed and well-nourished. No distress.  HENT:  Head: Normocephalic and atraumatic.  Right Ear: External ear normal.  Left Ear: External ear normal.  Nose: Nose normal.  Mouth/Throat: Oropharynx is clear and moist. No oropharyngeal exudate.  Eyes: Conjunctivae are normal. Pupils are equal, round, and reactive to light. Right eye exhibits no discharge. Left eye exhibits no discharge. No scleral icterus.  Neck: Normal range of motion. Neck supple. No tracheal deviation present. No thyromegaly present.  Cardiovascular: Normal rate, regular rhythm, normal heart sounds and intact distal pulses.  Exam reveals no gallop and no friction rub.   No murmur heard. Pulmonary/Chest: Effort normal and breath sounds normal. No accessory muscle usage. Not tachypneic. No respiratory distress. She has no decreased breath sounds. She has no wheezes. She has no rhonchi. She has no rales. She exhibits no tenderness.    Abdominal: Soft. Bowel sounds are normal. She exhibits no distension and no mass. There is no tenderness. There is no rebound and no guarding.  Musculoskeletal: Normal range of motion. She exhibits no edema and no tenderness.  Lymphadenopathy:    She has no cervical adenopathy.  Neurological: She is alert and oriented to person, place, and time. No cranial nerve deficit. She exhibits normal muscle tone. Coordination normal.  Skin: Skin is warm and dry. No rash noted. She is not diaphoretic. No erythema. No pallor.  Psychiatric: Her speech is normal and behavior is normal. Judgment and thought content normal. Her mood appears anxious. Cognition and memory are normal. She exhibits a depressed mood.          Assessment & Plan:

## 2013-02-25 NOTE — Assessment & Plan Note (Addendum)
Wt Readings from Last 3 Encounters:  02/25/13 156 lb (70.761 kg)  12/02/12 169 lb (76.658 kg)  10/27/12 173 lb (78.472 kg)   Patient has lost nearly 20 pounds since April 2014. This has been unintentional. She is symptomatic with profuse watery diarrhea occurring on a daily basis. She also has flushing. 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 Will send 24-hour urine collection for 5-HIAA today. Will also check CBC, CMP, TSH, lipase. Given periumbilical abdominal pain will get CT of the abdomen for evaluation.  Over of which >50% spent in face-to-face contact with patient discussing plan of care

## 2013-03-05 LAB — 5 HIAA, QUANTITATIVE, URINE, 24 HOUR: 5-HIAA, 24 Hr Urine: 1.6 mg/24 h (ref <=6.0)

## 2013-03-09 ENCOUNTER — Encounter: Payer: Self-pay | Admitting: Internal Medicine

## 2013-08-09 ENCOUNTER — Other Ambulatory Visit: Payer: Self-pay | Admitting: Internal Medicine

## 2013-11-04 LAB — HM MAMMOGRAPHY

## 2013-11-10 ENCOUNTER — Other Ambulatory Visit: Payer: Self-pay | Admitting: Internal Medicine

## 2014-01-11 ENCOUNTER — Telehealth: Payer: Self-pay | Admitting: *Deleted

## 2014-01-11 MED ORDER — LETROZOLE 2.5 MG PO TABS: 2.5000 mg | ORAL_TABLET | Freq: Every day | ORAL | Status: DC

## 2014-01-11 NOTE — Telephone Encounter (Signed)
Please see note.

## 2014-01-11 NOTE — Telephone Encounter (Signed)
Fax from [facility] requesting Letrozole 2.5mg .  Please advise refill.

## 2014-01-11 NOTE — Telephone Encounter (Signed)
Fine to refill x6 months 

## 2014-02-13 ENCOUNTER — Other Ambulatory Visit: Payer: Self-pay | Admitting: *Deleted

## 2014-02-13 MED ORDER — ALENDRONATE SODIUM 70 MG PO TABS: ORAL_TABLET | ORAL | Status: DC

## 2014-02-15 ENCOUNTER — Other Ambulatory Visit: Payer: Self-pay | Admitting: *Deleted

## 2014-02-15 MED ORDER — FLUTICASONE PROPIONATE 50 MCG/ACT NA SUSP: 2.0000 | Freq: Every day | NASAL | Status: DC

## 2014-04-27 ENCOUNTER — Telehealth: Payer: Self-pay | Admitting: *Deleted

## 2014-04-27 ENCOUNTER — Other Ambulatory Visit: Payer: Self-pay | Admitting: *Deleted

## 2014-04-27 DIAGNOSIS — Z0000 Encounter for general adult medical examination without abnormal findings: Secondary | ICD-10-CM

## 2014-04-27 NOTE — Telephone Encounter (Signed)
Pt is coming in tomorrow what labs and dx?  

## 2014-04-27 NOTE — Telephone Encounter (Signed)
Pt is coming to see you on 11.04.2015

## 2014-04-27 NOTE — Telephone Encounter (Signed)
CMP, CBC, lipids, TSH, Vit D, urine microalbumin

## 2014-04-27 NOTE — Telephone Encounter (Signed)
Is this for a physical? I don't see that she is due for labs

## 2014-04-28 ENCOUNTER — Other Ambulatory Visit (INDEPENDENT_AMBULATORY_CARE_PROVIDER_SITE_OTHER): Payer: 59

## 2014-04-28 ENCOUNTER — Telehealth: Payer: Self-pay | Admitting: *Deleted

## 2014-04-28 DIAGNOSIS — Z0000 Encounter for general adult medical examination without abnormal findings: Secondary | ICD-10-CM

## 2014-04-28 LAB — LIPID PANEL
CHOL/HDL RATIO: 3
Cholesterol: 217 mg/dL — ABNORMAL HIGH (ref 0–200)
HDL: 64.3 mg/dL (ref >39.00)
LDL CALC: 141 mg/dL — AB (ref 0–99)
NONHDL: 152.7
TRIGLYCERIDES: 57 mg/dL (ref 0.0–149.0)
VLDL: 11.4 mg/dL (ref 0.0–40.0)

## 2014-04-28 LAB — CBC WITH DIFFERENTIAL/PLATELET
BASOS ABS: 0.1 10*3/uL (ref 0.0–0.1)
Basophils Relative: 1.1 % (ref 0.0–3.0)
Eosinophils Absolute: 0.1 10*3/uL (ref 0.0–0.7)
Eosinophils Relative: 2.2 % (ref 0.0–5.0)
HCT: 41.9 % (ref 36.0–46.0)
Hemoglobin: 14.1 g/dL (ref 12.0–15.0)
LYMPHS PCT: 24.1 % (ref 12.0–46.0)
Lymphs Abs: 1.2 10*3/uL (ref 0.7–4.0)
MCHC: 33.6 g/dL (ref 30.0–36.0)
MCV: 95.7 fl (ref 78.0–100.0)
MONO ABS: 0.5 10*3/uL (ref 0.1–1.0)
Monocytes Relative: 10.2 % (ref 3.0–12.0)
Neutro Abs: 3.2 10*3/uL (ref 1.4–7.7)
Neutrophils Relative %: 62.4 % (ref 43.0–77.0)
PLATELETS: 252 10*3/uL (ref 150.0–400.0)
RBC: 4.37 Mil/uL (ref 3.87–5.11)
RDW: 13 % (ref 11.5–15.5)
WBC: 5.1 10*3/uL (ref 4.0–10.5)

## 2014-04-28 LAB — URINALYSIS, ROUTINE W REFLEX MICROSCOPIC
Bilirubin Urine: NEGATIVE
Ketones, ur: NEGATIVE
LEUKOCYTES UA: NEGATIVE
NITRITE: NEGATIVE
SPECIFIC GRAVITY, URINE: 1.025 (ref 1.000–1.030)
Total Protein, Urine: NEGATIVE
Urine Glucose: NEGATIVE
Urobilinogen, UA: 0.2 (ref 0.0–1.0)
pH: 5.5 (ref 5.0–8.0)

## 2014-04-28 LAB — COMPREHENSIVE METABOLIC PANEL
ALT: 14 U/L (ref 0–35)
AST: 21 U/L (ref 0–37)
Albumin: 3.5 g/dL (ref 3.5–5.2)
Alkaline Phosphatase: 43 U/L (ref 39–117)
BUN: 13 mg/dL (ref 6–23)
CALCIUM: 9.3 mg/dL (ref 8.4–10.5)
CHLORIDE: 106 meq/L (ref 96–112)
CO2: 28 meq/L (ref 19–32)
CREATININE: 0.8 mg/dL (ref 0.4–1.2)
GFR: 75.73 mL/min (ref >60.00)
GLUCOSE: 82 mg/dL (ref 70–99)
Potassium: 4.1 mEq/L (ref 3.5–5.1)
Sodium: 139 mEq/L (ref 135–145)
Total Bilirubin: 0.7 mg/dL (ref 0.2–1.2)
Total Protein: 7 g/dL (ref 6.0–8.3)

## 2014-04-28 LAB — MICROALBUMIN / CREATININE URINE RATIO
CREATININE, U: 174.4 mg/dL
MICROALB UR: 1.9 mg/dL (ref 0.0–1.9)
MICROALB/CREAT RATIO: 1.1 mg/g (ref 0.0–30.0)

## 2014-04-28 LAB — TSH: TSH: 1 u[IU]/mL (ref 0.35–4.50)

## 2014-04-28 LAB — VITAMIN D 25 HYDROXY (VIT D DEFICIENCY, FRACTURES): VITD: 32.01 ng/mL (ref 30.00–100.00)

## 2014-04-28 NOTE — Telephone Encounter (Signed)
Pt would like to check for uti? She is coming in next week (11.04.2014)

## 2014-04-28 NOTE — Telephone Encounter (Signed)
OK. Fine to send UA

## 2014-04-28 NOTE — Addendum Note (Signed)
Addended by: Karlene Einstein D on: 04/28/2014 08:32 AM   Modules accepted: Orders

## 2014-05-03 ENCOUNTER — Encounter: Payer: BC Managed Care – PPO | Admitting: Internal Medicine

## 2014-05-09 ENCOUNTER — Encounter: Payer: Self-pay | Admitting: Internal Medicine

## 2014-05-09 ENCOUNTER — Ambulatory Visit (INDEPENDENT_AMBULATORY_CARE_PROVIDER_SITE_OTHER): Payer: 59 | Admitting: Internal Medicine

## 2014-05-09 VITALS — BP 104/68 | HR 73 | Temp 98.1°F | Ht 71.5 in | Wt 155.2 lb

## 2014-05-09 DIAGNOSIS — C50912 Malignant neoplasm of unspecified site of left female breast: Secondary | ICD-10-CM

## 2014-05-09 DIAGNOSIS — Z0000 Encounter for general adult medical examination without abnormal findings: Secondary | ICD-10-CM

## 2014-05-09 DIAGNOSIS — Z1211 Encounter for screening for malignant neoplasm of colon: Secondary | ICD-10-CM

## 2014-05-09 NOTE — Progress Notes (Signed)
Pre visit review using our clinic review tool, if applicable. No additional management support is needed unless otherwise documented below in the visit note. 

## 2014-05-09 NOTE — Progress Notes (Signed)
Subjective:    Patient ID: Deborah Mcconnell, female    DOB: Jan 10, 1951, 63 y.o.   MRN: [id number]  HPI 63YO female presents for annual exam.  Recently changed from Fosamax to Actonel by Dr. Gale Journey because of esophageal spasm. Symptoms improved with stopping Fosamax.  Aside from this, feeling well. No concerns today.  Follows healthy diet and is physically active.   Review of Systems  Constitutional: Negative for fever, chills, appetite change, fatigue and unexpected weight change.  Eyes: Negative for visual disturbance.  Respiratory: Negative for shortness of breath.   Cardiovascular: Negative for chest pain and leg swelling.  Gastrointestinal: Negative for nausea, vomiting, abdominal pain, diarrhea and constipation.  Musculoskeletal: Negative for myalgias and arthralgias.  Skin: Negative for color change and rash.  Hematological: Negative for adenopathy. Does not bruise/bleed easily.  Psychiatric/Behavioral: Negative for dysphoric mood. The patient is not nervous/anxious.        Objective:    BP 104/68 mmHg  Pulse 73  Temp(Src) 98.1 F (36.7 C) (Oral)  Ht 5' 11.5" (1.816 m)  Wt 155 lb 4 oz (70.421 kg)  BMI 21.35 kg/m2  SpO2 97% Physical Exam  Constitutional: She is oriented to person, place, and time. She appears well-developed and well-nourished. No distress.  HENT:  Head: Normocephalic and atraumatic.  Right Ear: External ear normal.  Left Ear: External ear normal.  Nose: Nose normal.  Mouth/Throat: Oropharynx is clear and moist. No oropharyngeal exudate.  Eyes: Conjunctivae are normal. Pupils are equal, round, and reactive to light. Right eye exhibits no discharge. Left eye exhibits no discharge. No scleral icterus.  Neck: Normal range of motion. Neck supple. No tracheal deviation present. No thyromegaly present.  Cardiovascular: Normal rate, regular rhythm, normal heart sounds and intact distal pulses.  Exam reveals no gallop and no friction rub.   No murmur  heard. Pulmonary/Chest: Effort normal and breath sounds normal. No accessory muscle usage. No tachypnea. No respiratory distress. She has no decreased breath sounds. She has no wheezes. She has no rales. She exhibits no tenderness. Right breast exhibits no inverted nipple, no mass, no nipple discharge, no skin change and no tenderness. Left breast exhibits no inverted nipple, no mass, no nipple discharge, no skin change and no tenderness. Breasts are symmetrical.    Abdominal: Soft. Bowel sounds are normal. She exhibits no distension and no mass. There is no tenderness. There is no rebound and no guarding.  Musculoskeletal: Normal range of motion. She exhibits no edema or tenderness.  Lymphadenopathy:    She has no cervical adenopathy.  Neurological: She is alert and oriented to person, place, and time. No cranial nerve deficit. She exhibits normal muscle tone. Coordination normal.  Skin: Skin is warm and dry. No rash noted. She is not diaphoretic. No erythema. No pallor.  Psychiatric: She has a normal mood and affect. Her behavior is normal. Judgment and thought content normal.          Assessment & Plan:   Problem List Items Addressed This Visit      Unprioritized   Malignant neoplasm of breast (female)   Routine general medical examination at a health care facility - Primary    General medical exam normal today including breast exam. PAP and pelvic deferred as normal PAP, HPV neg in 2014. Plan repeat in 2017. Mammogram UTD and reviewed. Colonoscopy ordered. Flu vaccine UTD. Reviewed labs with pt. Encouraged healthy diet and exercise.    Screening for colon cancer   Relevant Orders  Ambulatory referral to Gastroenterology       Return in about 1 year (around 05/10/2015) for Physical.

## 2014-05-09 NOTE — Assessment & Plan Note (Signed)
General medical exam normal today including breast exam. PAP and pelvic deferred as normal PAP, HPV neg in 2014. Plan repeat in 2017. Mammogram UTD and reviewed. Colonoscopy ordered. Flu vaccine UTD. Reviewed labs with pt. Encouraged healthy diet and exercise.

## 2014-05-09 NOTE — Patient Instructions (Signed)

## 2014-07-14 ENCOUNTER — Other Ambulatory Visit: Payer: Self-pay | Admitting: Internal Medicine

## 2014-07-24 ENCOUNTER — Ambulatory Visit: Payer: Self-pay | Admitting: Gastroenterology

## 2014-08-28 ENCOUNTER — Encounter: Payer: Self-pay | Admitting: Internal Medicine

## 2014-08-30 ENCOUNTER — Encounter: Payer: Self-pay | Admitting: Internal Medicine

## 2014-08-30 ENCOUNTER — Ambulatory Visit (INDEPENDENT_AMBULATORY_CARE_PROVIDER_SITE_OTHER): Payer: 59 | Admitting: Internal Medicine

## 2014-08-30 VITALS — BP 111/70 | HR 77 | Temp 98.1°F | Ht 71.5 in | Wt 164.4 lb

## 2014-08-30 DIAGNOSIS — N3001 Acute cystitis with hematuria: Secondary | ICD-10-CM | POA: Insufficient documentation

## 2014-08-30 LAB — POCT URINALYSIS DIPSTICK
BILIRUBIN UA: NEGATIVE
GLUCOSE UA: NEGATIVE
KETONES UA: NEGATIVE
Nitrite, UA: NEGATIVE
Protein, UA: NEGATIVE
SPEC GRAV UA: 1.01
Urobilinogen, UA: 0.2
pH, UA: 6

## 2014-08-30 MED ORDER — SULFAMETHOXAZOLE-TRIMETHOPRIM 800-160 MG PO TABS: 1.0000 | ORAL_TABLET | Freq: Two times a day (BID) | ORAL | Status: DC

## 2014-08-30 NOTE — Progress Notes (Signed)
Subjective:    Patient ID: Deborah Mcconnell, female    DOB: Jun 21, 1951, 64 y.o.   MRN: [id number]  HPI  64YO female presents for acute visit.  Feeling nauseous for last two weeks. No vomiting. No fever, chills. Occasionally felt "clammy."  Monday afternoon felt urinary urgency with severe burning pain with urination. Has also had low back aching, and this improved with urination. Has increased urine intake. Today, noted some blood and pus in the urine with burning pain with urination and urgency. Notes a strong smell to urine.  Past medical, surgical, family and social history per today's encounter.  Review of Systems  Constitutional: Positive for fatigue. Negative for fever, chills, appetite change and unexpected weight change.  Eyes: Negative for visual disturbance.  Respiratory: Negative for shortness of breath.   Cardiovascular: Negative for chest pain and leg swelling.  Gastrointestinal: Positive for nausea. Negative for vomiting, abdominal pain, diarrhea and constipation.  Genitourinary: Positive for dysuria, urgency, frequency and hematuria. Negative for decreased urine volume.  Skin: Negative for color change and rash.  Hematological: Negative for adenopathy. Does not bruise/bleed easily.  Psychiatric/Behavioral: Negative for dysphoric mood. The patient is not nervous/anxious.        Objective:    BP 111/70 mmHg  Pulse 77  Temp(Src) 98.1 F (36.7 C) (Oral)  Ht 5' 11.5" (1.816 m)  Wt 164 lb 6 oz (74.56 kg)  BMI 22.61 kg/m2  SpO2 99% Physical Exam  Constitutional: She is oriented to person, place, and time. She appears well-developed and well-nourished. No distress.  HENT:  Head: Normocephalic and atraumatic.  Right Ear: External ear normal.  Left Ear: External ear normal.  Nose: Nose normal.  Mouth/Throat: Oropharynx is clear and moist. No oropharyngeal exudate.  Eyes: Conjunctivae are normal. Pupils are equal, round, and reactive to light. Right eye exhibits  no discharge. Left eye exhibits no discharge. No scleral icterus.  Neck: Normal range of motion. Neck supple. No tracheal deviation present. No thyromegaly present.  Cardiovascular: Normal rate, regular rhythm, normal heart sounds and intact distal pulses.  Exam reveals no gallop and no friction rub.   No murmur heard. Pulmonary/Chest: Effort normal and breath sounds normal. No respiratory distress. She has no wheezes. She has no rales. She exhibits no tenderness.  Abdominal: Soft. Bowel sounds are normal. She exhibits no distension. There is tenderness (mild suprapubic).  Musculoskeletal: Normal range of motion. She exhibits no edema or tenderness.  Lymphadenopathy:    She has no cervical adenopathy.  Neurological: She is alert and oriented to person, place, and time. No cranial nerve deficit. She exhibits normal muscle tone. Coordination normal.  Skin: Skin is warm and dry. No rash noted. She is not diaphoretic. No erythema. No pallor.  Psychiatric: She has a normal mood and affect. Her behavior is normal. Judgment and thought content normal.          Assessment & Plan:  Over 70min of which >50% spent in face-to-face contact with patient discussing plan of care   Problem List Items Addressed This Visit      Unprioritized   Acute cystitis with hematuria - Primary    Symptoms and UA most consistent with UTI. Will start Bactrim. Start Azo prn. Continue increased fluid intake. Follow up by email tomorrow to see if nausea improved.      Relevant Medications   sulfamethoxazole-trimethoprim (BACTRIM DS) tablet 800-160 mg   Other Relevant Orders   POCT Urinalysis Dipstick (Completed)   CULTURE, URINE COMPREHENSIVE  Return if symptoms worsen or fail to improve.

## 2014-08-30 NOTE — Patient Instructions (Signed)
Start Bactrim twice daily for 1 week.  Email with update on Thursday or Friday.  We will call with urine culture results.

## 2014-08-30 NOTE — Progress Notes (Signed)
Pre visit review using our clinic review tool, if applicable. No additional management support is needed unless otherwise documented below in the visit note. 

## 2014-08-30 NOTE — Assessment & Plan Note (Signed)
Symptoms and UA most consistent with UTI. Will start Bactrim. Start Azo prn. Continue increased fluid intake. Follow up by email tomorrow to see if nausea improved.

## 2014-09-01 ENCOUNTER — Encounter: Payer: Self-pay | Admitting: Internal Medicine

## 2014-09-02 LAB — CULTURE, URINE COMPREHENSIVE: Colony Count: >=100000

## 2014-09-04 ENCOUNTER — Encounter: Payer: Self-pay | Admitting: Internal Medicine

## 2014-09-14 ENCOUNTER — Other Ambulatory Visit: Payer: Self-pay | Admitting: Internal Medicine

## 2014-09-14 NOTE — Telephone Encounter (Signed)
Last refill and OV 3.2.16.  Please advise refill

## 2014-09-14 NOTE — Telephone Encounter (Signed)
Can you ask her why she needs refill?

## 2014-10-03 ENCOUNTER — Encounter: Payer: Self-pay | Admitting: Internal Medicine

## 2014-10-03 NOTE — Telephone Encounter (Signed)
Can you please call her to schedule an appointment 68min

## 2014-10-05 ENCOUNTER — Emergency Department
Admit: 2014-10-05 | Disposition: A | Discharge status: Home / Self Care | Payer: Self-pay | Admitting: Emergency Medicine

## 2014-10-06 ENCOUNTER — Other Ambulatory Visit: Payer: Self-pay | Admitting: *Deleted

## 2014-10-06 ENCOUNTER — Telehealth: Payer: Self-pay | Admitting: *Deleted

## 2014-10-06 MED ORDER — CEPHALEXIN 500 MG PO CAPS: 500.0000 mg | ORAL_CAPSULE | Freq: Three times a day (TID) | ORAL | Status: DC

## 2014-10-06 NOTE — Telephone Encounter (Addendum)
Pt called states she was seen in the ER for a finger injury yesterday.  Pt states a window fell on her finger and amputated the tip of her finger, states finger was reattached.  She states she was prescribed Bactrim and is experiencing vomiting.  Pt states the RN from ER advised she call her PCP for alternate antibiotic.  ER chart requested.  Please advise

## 2014-10-06 NOTE — Telephone Encounter (Signed)
OK. That is fine. If she has any worsening pain, redness, fever, then will need to be seen immediately in the ED this weekend.

## 2014-10-06 NOTE — Telephone Encounter (Signed)
Rx sent, Deborah Mcconnell notified of MDs message.  However Deborah Mcconnell states she has appoint with Dr Marry Guan next week due to injury being workers comp.  Do you still want Deborah Mcconnell to be scheduled with [facility]?  Please advise

## 2014-10-06 NOTE — Telephone Encounter (Signed)
Spoke with pt, advised of MDs message.  Pt verbalized understanding 

## 2014-10-06 NOTE — Telephone Encounter (Signed)
We can try Keflex 500mg  po tid #21. She will need a follow up here next week.

## 2014-10-11 ENCOUNTER — Encounter: Payer: Self-pay | Admitting: Internal Medicine

## 2014-10-11 DIAGNOSIS — S62638A Displaced fracture of distal phalanx of other finger, initial encounter for closed fracture: Secondary | ICD-10-CM | POA: Insufficient documentation

## 2014-10-12 ENCOUNTER — Encounter: Payer: Self-pay | Admitting: Internal Medicine

## 2014-10-15 ENCOUNTER — Emergency Department
Admit: 2014-10-15 | Disposition: A | Discharge status: Home / Self Care | Payer: Self-pay | Admitting: Emergency Medicine

## 2014-10-27 ENCOUNTER — Ambulatory Visit (INDEPENDENT_AMBULATORY_CARE_PROVIDER_SITE_OTHER): Payer: 59 | Admitting: Internal Medicine

## 2014-10-27 ENCOUNTER — Encounter: Payer: Self-pay | Admitting: Internal Medicine

## 2014-10-27 VITALS — BP 113/70 | HR 66 | Temp 98.1°F | Ht 71.5 in | Wt 163.2 lb

## 2014-10-27 DIAGNOSIS — R42 Dizziness and giddiness: Secondary | ICD-10-CM | POA: Diagnosis not present

## 2014-10-27 DIAGNOSIS — S62638A Displaced fracture of distal phalanx of other finger, initial encounter for closed fracture: Secondary | ICD-10-CM | POA: Diagnosis not present

## 2014-10-27 NOTE — Assessment & Plan Note (Signed)
Continue to follow up with Dr. Tamala Julian. Continue Ibuprofen prn pain.

## 2014-10-27 NOTE — Patient Instructions (Addendum)
We will set up evaluation with Dr. Tomi Likens with Neurology.  Follow up in 4 weeks and prn.

## 2014-10-27 NOTE — Assessment & Plan Note (Signed)
Episodic vertigo 3-4 times per year. 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 Discussed potential imaging with MRI brain. Will first set up neurology evaluation. Question if additional imaging would be helpful.  Question if trial of Imitrex might be helpful versus using Valium as recommended by ENT. Follow up in 4 weeks.

## 2014-10-27 NOTE — Progress Notes (Signed)
Pre visit review using our clinic review tool, if applicable. No additional management support is needed unless otherwise documented below in the visit note. 

## 2014-10-27 NOTE — Progress Notes (Signed)
Subjective:    Patient ID: Deborah Mcconnell, female    DOB: 21-Oct-1950, 64 y.o.   MRN: [id number]  HPI  64YO female presents for follow up.  Finger injury - Fracture with fragmentation after window fell on hand at school. Had 14 stitches. Seen by Dr. Tamala Julian. Plans for follow up in 2 weeks with repeat xray.  Vertigo - Seen by ENT at [facility] for episodic vertigo. Diagnosed with possible migraines and given Valium to use. Has not tried this medication.  Feels nauseous and has loose stools,with debilitating vertigo with these episodes which occur about 3-4 times per year. Last about 18hr. No headaches with episodes.Tried Meclizine in past with no improvement.   Wt Readings from Last 3 Encounters:  10/27/14 163 lb 4 oz (74.05 kg)  08/30/14 164 lb 6 oz (74.56 kg)  05/09/14 155 lb 4 oz (70.421 kg)    Past medical, surgical, family and social history per today's encounter.  Review of Systems  Constitutional: Negative for fever, chills, appetite change, fatigue and unexpected weight change.  Eyes: Negative for visual disturbance.  Respiratory: Negative for shortness of breath.   Cardiovascular: Negative for chest pain and leg swelling.  Gastrointestinal: Positive for nausea and diarrhea. Negative for vomiting, abdominal pain and constipation.  Musculoskeletal: Positive for arthralgias. Negative for myalgias.  Skin: Negative for color change and rash.  Neurological: Positive for dizziness. Negative for tremors, seizures, syncope, speech difficulty, weakness, light-headedness, numbness and headaches.  Hematological: Negative for adenopathy. Does not bruise/bleed easily.  Psychiatric/Behavioral: Negative for sleep disturbance and dysphoric mood. The patient is not nervous/anxious.        Objective:    BP 113/70 mmHg  Pulse 66  Temp(Src) 98.1 F (36.7 C) (Oral)  Ht 5' 11.5" (1.816 m)  Wt 163 lb 4 oz (74.05 kg)  BMI 22.45 kg/m2  SpO2 99% Physical Exam  Constitutional: She is  oriented to person, place, and time. She appears well-developed and well-nourished. No distress.  HENT:  Head: Normocephalic and atraumatic.  Right Ear: Tympanic membrane and external ear normal.  Left Ear: Tympanic membrane and external ear normal.  Nose: Nose normal.  Mouth/Throat: Oropharynx is clear and moist. No oropharyngeal exudate.  Eyes: Conjunctivae are normal. Pupils are equal, round, and reactive to light. Right eye exhibits no discharge. Left eye exhibits no discharge. No scleral icterus.  Neck: Normal range of motion. Neck supple. No tracheal deviation present. No thyromegaly present.  Cardiovascular: Normal rate, regular rhythm, normal heart sounds and intact distal pulses.  Exam reveals no gallop and no friction rub.   No murmur heard. Pulmonary/Chest: Effort normal and breath sounds normal. No respiratory distress. She has no wheezes. She has no rales. She exhibits no tenderness.  Musculoskeletal: Normal range of motion. She exhibits no edema or tenderness.       Hands: Lymphadenopathy:    She has no cervical adenopathy.  Neurological: She is alert and oriented to person, place, and time. No cranial nerve deficit. She exhibits normal muscle tone. Coordination normal.  Skin: Skin is warm and dry. No rash noted. She is not diaphoretic. No erythema. No pallor.  Psychiatric: She has a normal mood and affect. Her behavior is normal. Judgment and thought content normal.          Assessment & Plan:   Problem List Items Addressed This Visit      Unprioritized   Closed fracture of distal phalanx of middle finger    Continue to follow up with Dr.  Smith. Continue Ibuprofen prn pain.      Vertigo - Primary    Episodic vertigo 3-4 times per year. 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 Discussed potential imaging with MRI brain. Will first set up neurology evaluation. Question if additional imaging  would be helpful.  Question if trial of Imitrex might be helpful versus using Valium as recommended by ENT. Follow up in 4 weeks.      Relevant Orders   Ambulatory referral to Neurology       Return in about 4 weeks (around 11/24/2014).

## 2014-11-10 LAB — HM MAMMOGRAPHY: HM MAMMO: NORMAL

## 2014-12-19 ENCOUNTER — Encounter: Payer: Self-pay | Admitting: Internal Medicine

## 2014-12-19 ENCOUNTER — Ambulatory Visit (INDEPENDENT_AMBULATORY_CARE_PROVIDER_SITE_OTHER): Payer: 59 | Admitting: Internal Medicine

## 2014-12-19 VITALS — BP 120/62 | HR 86 | Temp 98.3°F | Wt 165.8 lb

## 2014-12-19 DIAGNOSIS — R350 Frequency of micturition: Secondary | ICD-10-CM

## 2014-12-19 DIAGNOSIS — N390 Urinary tract infection, site not specified: Secondary | ICD-10-CM

## 2014-12-19 LAB — POCT URINALYSIS DIPSTICK
BILIRUBIN UA: NEGATIVE
Glucose, UA: NEGATIVE
KETONES UA: NEGATIVE
NITRITE UA: NEGATIVE
PH UA: 6
Protein, UA: NEGATIVE
Spec Grav, UA: 1.025
UROBILINOGEN UA: NEGATIVE

## 2014-12-19 MED ORDER — CEPHALEXIN 500 MG PO CAPS: 500.0000 mg | ORAL_CAPSULE | Freq: Three times a day (TID) | ORAL | Status: DC

## 2014-12-19 NOTE — Addendum Note (Signed)
Addended by: Lurlean Nanny on: 12/19/2014 03:12 PM   Modules accepted: Orders

## 2014-12-19 NOTE — Patient Instructions (Signed)

## 2014-12-19 NOTE — Progress Notes (Signed)
HPI  Pt presents to the clinic today with c/o urinary urgency, frequency and burning sensation. This started 2 days ago. She does have some associated low back pain. She denies fever, chills or nausea. She has not tried anything OTC. She has tried to push fluids. She denies vaginal complaints. She did have a UTI 08/2014 treated with Bactrim.   Review of Systems  Past Medical History  Diagnosis Date  . Vertigo   . Hyperlipidemia   . Osteopenia     previously taking Evista and Actonel  . Osteoporosis     Family History  Problem Relation Age of Onset  . Osteoporosis Mother   . Osteoporosis Sister     History   Social History  . Marital Status: Married    Spouse Name: N/A  . Number of Children: N/A  . Years of Education: N/A   Occupational History  . Not on file.   Social History Main Topics  . Smoking status: Never Smoker   . Smokeless tobacco: Never Used  . Alcohol Use: 0.0 oz/week    0 Standard drinks or equivalent per week     Comment: rare  . Drug Use: No  . Sexual Activity: Not on file   Other Topics Concern  . Not on file   Social History Narrative    Allergies  Allergen Reactions  . Ciprofloxacin Anaphylaxis    Causes death  . Ofloxacin Anaphylaxis    Causes Death  . Quinolones Anaphylaxis    Constitutional: Denies fever, malaise, fatigue, headache or abrupt weight changes.   GU: Pt reports urgency, frequency and pain with urination. Denies burning sensation, blood in urine, odor or discharge. Skin: Denies redness, rashes, lesions or ulcercations.   No other specific complaints in a complete review of systems (except as listed in HPI above).    Objective:   Physical Exam  BP 120/62 mmHg  Pulse 86  Temp(Src) 98.3 F (36.8 C) (Oral)  Wt 165 lb 12.8 oz (75.206 kg)  SpO2 98%  Wt Readings from Last 3 Encounters:  10/27/14 163 lb 4 oz (74.05 kg)  08/30/14 164 lb 6 oz (74.56 kg)  05/09/14 155 lb 4 oz (70.421 kg)    General: Appears her  stated age, well developed, well nourished in NAD. Cardiovascular: Normal rate and rhythm. S1,S2 noted.  No murmur, rubs or gallops noted.  Pulmonary/Chest: Normal effort and positive vesicular breath sounds. No respiratory distress. No wheezes, rales or ronchi noted.  Abdomen: Soft. Normal bowel sounds, no bruits noted. No distention or masses noted. Tender to palpation over the bladder area. No CVA tenderness.      Assessment & Plan:   Urgency, Frequency, Dysuria secondary to UTI:  Urinalysis: trace leuks, trace blood Will send urine culture eRx sent if for Keflex 500 mg TID x 5 days OK to take AZO OTC Drink plenty of fluids  RTC as needed or if symptoms persist.

## 2014-12-19 NOTE — Progress Notes (Signed)
Pre visit review using our clinic review tool, if applicable. No additional management support is needed unless otherwise documented below in the visit note. 

## 2014-12-21 ENCOUNTER — Ambulatory Visit (INDEPENDENT_AMBULATORY_CARE_PROVIDER_SITE_OTHER): Payer: 59 | Admitting: Neurology

## 2014-12-21 ENCOUNTER — Encounter: Payer: Self-pay | Admitting: Neurology

## 2014-12-21 VITALS — BP 128/70 | HR 74 | Resp 16 | Ht 71.5 in | Wt 164.5 lb

## 2014-12-21 DIAGNOSIS — R42 Dizziness and giddiness: Secondary | ICD-10-CM

## 2014-12-21 LAB — URINE CULTURE
COLONY COUNT: NO GROWTH
Organism ID, Bacteria: NO GROWTH

## 2014-12-21 NOTE — Patient Instructions (Signed)
I know it is not anything serious that would be causing this.  It is likely just typical vertigo spells or possibly migraine.  I would just monitor.  If symptoms get worse or have new symptoms, call us.  Follow up in 6 months.

## 2014-12-21 NOTE — Progress Notes (Signed)
NEUROLOGY CONSULTATION NOTE  Deborah Mcconnell MRN: [id number] DOB: 1951/04/29  Referring provider: Dr. Gilford Rile Primary care provider: Dr. Gilford Rile  Reason for consult:  Vertigo  HISTORY OF PRESENT ILLNESS: Deborah Mcconnell is a 64 year old right-handed woman with osteopenia and history of breast cancer status post radiation who presents for vertigo.  Records and audiogram report from [facility] reviewed.  About 8 years ago, she had a severe onset of vertigo, described as spinning sensation and associated with nausea and vomiting.  It lasted about a day and resolved.  Since that time, she will have an episode every 3 to 6 months, although not as severe as the initial episode.  It is a spinning sensation that occurs with any slight head movement or change in position.  If she stays still, it will resolve in a few seconds.  She often will have an upset stomach with loose stool.  Usually, she has no nausea.  It lasts all day, in which she cannot get out of bed.  The day before, she will often not feel well.  The day afterwards, she will feel fatigued.  She reportedly had an MRI of the brain performed 4 years ago, which was reportedly unremarkable.  She was evaluated by ENT at [facility].  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  Vestibular testing was normal without evidence of peripheral or central vestibular pathology.  It was suggested that she may have migraines.  She has no history of migraines.  Occasionally, she has an aching in either maxilla.  Her spells are not associated with headache, photophobia, phonophobia or visual disturbance.  She had an episode earlier this week.  Her prior episode last occurred in January.  At this time, she feels well.  She denies headache, visual disturbance, or difficulty walking.  These are still her habitual spells, although they have lately been slightly less intense.  She was diagnosed with breast cancer 2 years  ago and underwent radiation.  She currently takes letrozole.    PAST MEDICAL HISTORY: Past Medical History  Diagnosis Date  . Vertigo   . Hyperlipidemia   . Osteopenia     previously taking Evista and Actonel  . Osteoporosis   . Cancer     PAST SURGICAL HISTORY: Past Surgical History  Procedure Laterality Date  . Cholecystectomy    . Lt breast lumpectomy      MEDICATIONS: Current Outpatient Prescriptions on File Prior to Visit  Medication Sig Dispense Refill  . aspirin EC 81 MG tablet Take 81 mg by mouth as needed.      . calcium carbonate (CALCIUM 600) 600 MG TABS tablet Take by mouth. Take 1,200 mg by mouth.    . cephALEXin (KEFLEX) 500 MG capsule Take 1 capsule (500 mg total) by mouth 3 (three) times daily. (Patient not taking: Reported on 12/21/2014) 15 capsule 0  . cholecalciferol (VITAMIN D) 1000 UNITS tablet Take 1,000 Units by mouth daily.    Marland Kitchen ibuprofen (ADVIL,MOTRIN) 200 MG tablet Take 600 mg by mouth 2 (two) times daily. prn    . letrozole (FEMARA) 2.5 MG tablet TAKE ONE TABLET BY MOUTH ONCE DAILY 30 tablet 0  . meclizine (ANTIVERT) 25 MG tablet Take 1 tablet (25 mg total) by mouth 3 (three) times daily as needed. 90 tablet 6  . risedronate (ACTONEL) 150 MG tablet Take by mouth.     No current facility-administered medications on file prior to visit.    ALLERGIES: Allergies  Allergen Reactions  .  Ciprofloxacin Anaphylaxis    Causes death  . Ofloxacin Anaphylaxis    Causes Death  . Quinolones Anaphylaxis    FAMILY HISTORY: Family History  Problem Relation Age of Onset  . Osteoporosis Mother   . Osteoporosis Sister   . Hypertension Mother   . Osteoarthritis Mother   . Osteoarthritis Sister   . Osteoarthritis Maternal Grandmother   . Diabetes Maternal Grandfather   . Cancer Paternal Grandfather     unknown    SOCIAL HISTORY: History   Social History  . Marital Status: Married    Spouse Name: N/A  . Number of Children: N/A  . Years of Education:  N/A   Occupational History  . Not on file.   Social History Main Topics  . Smoking status: Never Smoker   . Smokeless tobacco: Never Used  . Alcohol Use: 0.0 oz/week    0 Standard drinks or equivalent per week     Comment: rare  . Drug Use: No  . Sexual Activity: Yes   Other Topics Concern  . Not on file   Social History Narrative    REVIEW OF SYSTEMS: Constitutional: No fevers, chills, or sweats, no generalized fatigue, change in appetite Eyes: No visual changes, double vision, eye pain Ear, nose and throat: No hearing loss, ear pain, nasal congestion, sore throat Cardiovascular: No chest pain, palpitations Respiratory:  No shortness of breath at rest or with exertion, wheezes GastrointestinaI: No nausea, vomiting, diarrhea, abdominal pain, fecal incontinence Genitourinary:  No dysuria, urinary retention or frequency Musculoskeletal:  No neck pain, back pain Integumentary: No rash, pruritus, skin lesions Neurological: as above Psychiatric: No depression, insomnia, anxiety Endocrine: No palpitations, fatigue, diaphoresis, mood swings, change in appetite, change in weight, increased thirst Hematologic/Lymphatic:  No anemia, purpura, petechiae. Allergic/Immunologic: no itchy/runny eyes, nasal congestion, recent allergic reactions, rashes  PHYSICAL EXAM: Filed Vitals:   12/21/14 1012  BP: 128/70  Pulse: 74  Resp: 16   General: No acute distress Head:  Normocephalic/atraumatic Eyes:  fundi unremarkable, without vessel changes, exudates, hemorrhages or papilledema. Neck: supple, no paraspinal tenderness, full range of motion Back: No paraspinal tenderness Heart: regular rate and rhythm Lungs: Clear to auscultation bilaterally. Vascular: No carotid bruits. Neurological Exam: Mental status: alert and oriented to person, place, and time, recent and remote memory intact, fund of knowledge intact, attention and concentration intact, speech fluent and not dysarthric, language  intact. Cranial nerves: CN I: not tested CN II: pupils equal, round and reactive to light, visual fields intact, fundi unremarkable, without vessel changes, exudates, hemorrhages or papilledema. CN III, IV, VI:  full range of motion, no nystagmus, no ptosis CN V: facial sensation intact CN VII: upper and lower face symmetric CN VIII: hearing intact CN IX, X: gag intact, uvula midline CN XI: sternocleidomastoid and trapezius muscles intact CN XII: tongue midline Bulk & Tone: normal, no fasciculations. Motor:  5/5 throughout Sensation:  Temperature and vibration intact Deep Tendon Reflexes:  2+ throughout, toes downgoing. Finger to nose testing:  No dysmetria Heel to shin:  No dysmetria Gait:  Normal station and stride.  Able to turn and walk in tandem. Romberg negative.  IMPRESSION: Episodic vertigo.  Either benign paroxysmal positional vertigo versus migraine-associated vertigo.  Semiology not clear.  Symptoms that suggest BPPV:  Although the episodes last a day, they are still positional and only are present with head movement.  She has no associated typical migraine symptoms and she has no history of migraines.  ENT testing was normal, however  she was not currently having a spell.  However, she does describe a prodrome and feeling of fatigue for a day afterwards, as well as loose stool, which is not typical for BPPV.  To call it migraine associated vertigo would be a diagnosis of exclusion.  PLAN: 1.  We will hold off on pursuing brain imaging.  I know she has a history of breast cancer.  However, these are her same habitual spells for the past 8 years, so I believe it is benign.  She exhibits no other symptoms to suggest an intracranial process, such as metastasis. 2.  We will monitor and she will call us with any new or worsening symptoms that require further attention.  Otherwise, I will have her follow up in 6 months. 3.  Since these symptoms are so infrequent, there is no need to  initiate a migraine preventative medication, which would be the only change in management if we would be considering this to be migraine.  45 minutes spent face to face with patient, over 50% spent discussing differential diagnoses and plan.  Thank you for allowing me to take part in the care of this patient.  Metta Clines, DO  CC:  Ronette Deter, MD

## 2015-02-15 ENCOUNTER — Other Ambulatory Visit: Payer: Self-pay | Admitting: Internal Medicine

## 2015-03-15 ENCOUNTER — Ambulatory Visit (INDEPENDENT_AMBULATORY_CARE_PROVIDER_SITE_OTHER): Payer: 59 | Admitting: Family Medicine

## 2015-03-15 ENCOUNTER — Encounter: Payer: Self-pay | Admitting: Family Medicine

## 2015-03-15 VITALS — BP 118/64 | HR 66 | Temp 98.0°F | Ht 71.5 in | Wt 166.6 lb

## 2015-03-15 DIAGNOSIS — N390 Urinary tract infection, site not specified: Secondary | ICD-10-CM | POA: Insufficient documentation

## 2015-03-15 DIAGNOSIS — R110 Nausea: Secondary | ICD-10-CM | POA: Diagnosis not present

## 2015-03-15 DIAGNOSIS — N3000 Acute cystitis without hematuria: Secondary | ICD-10-CM

## 2015-03-15 LAB — POCT URINALYSIS DIPSTICK
Bilirubin, UA: NEGATIVE
Glucose, UA: NEGATIVE
KETONES UA: NEGATIVE
Nitrite, UA: NEGATIVE
Protein, UA: NEGATIVE
Spec Grav, UA: <=1.005
Urobilinogen, UA: 0.2
pH, UA: 6.5

## 2015-03-15 LAB — URINALYSIS, MICROSCOPIC ONLY: RBC / HPF: NONE SEEN (ref 0–?)

## 2015-03-15 MED ORDER — CEPHALEXIN 500 MG PO CAPS: 500.0000 mg | ORAL_CAPSULE | Freq: Two times a day (BID) | ORAL | Status: DC

## 2015-03-15 NOTE — Patient Instructions (Addendum)
Nice to meet you. You likely have a URI causing your symptoms.  We will treat this with keflex. If you develop abdominal pain, vomiting, diarrhea, blood in your urine or stool, fever, or persistent pain please seek medical attention.  Urinary Tract Infection Urinary tract infections (UTIs) can develop anywhere along your urinary tract. Your urinary tract is your body's drainage system for removing wastes and extra water. Your urinary tract includes two kidneys, two ureters, a bladder, and a urethra. Your kidneys are a pair of bean-shaped organs. Each kidney is about the size of your fist. They are located below your ribs, one on each side of your spine. CAUSES Infections are caused by microbes, which are microscopic organisms, including fungi, viruses, and bacteria. These organisms are so small that they can only be seen through a microscope. Bacteria are the microbes that most commonly cause UTIs. SYMPTOMS  Symptoms of UTIs may vary by age and gender of the patient and by the location of the infection. Symptoms in young women typically include a frequent and intense urge to urinate and a painful, burning feeling in the bladder or urethra during urination. Older women and men are more likely to be tired, shaky, and weak and have muscle aches and abdominal pain. A fever may mean the infection is in your kidneys. Other symptoms of a kidney infection include pain in your back or sides below the ribs, nausea, and vomiting. DIAGNOSIS To diagnose a UTI, your caregiver will ask you about your symptoms. Your caregiver also will ask to provide a urine sample. The urine sample will be tested for bacteria and white blood cells. White blood cells are made by your body to help fight infection. TREATMENT  Typically, UTIs can be treated with medication. Because most UTIs are caused by a bacterial infection, they usually can be treated with the use of antibiotics. The choice of antibiotic and length of treatment  depend on your symptoms and the type of bacteria causing your infection. HOME CARE INSTRUCTIONS  If you were prescribed antibiotics, take them exactly as your caregiver instructs you. Finish the medication even if you feel better after you have only taken some of the medication.  Drink enough water and fluids to keep your urine clear or pale yellow.  Avoid caffeine, tea, and carbonated beverages. They tend to irritate your bladder.  Empty your bladder often. Avoid holding urine for long periods of time.  Empty your bladder before and after sexual intercourse.  After a bowel movement, women should cleanse from front to back. Use each tissue only once. SEEK MEDICAL CARE IF:   You have back pain.  You develop a fever.  Your symptoms do not begin to resolve within 3 days. SEEK IMMEDIATE MEDICAL CARE IF:   You have severe back pain or lower abdominal pain.  You develop chills.  You have nausea or vomiting.  You have continued burning or discomfort with urination. MAKE SURE YOU:   Understand these instructions.  Will watch your condition.  Will get help right away if you are not doing well or get worse. Document Released: 03/26/2005 Document Revised: 12/16/2011 Document Reviewed: 07/25/2011 [facility] Patient Information 2015 [place], [place]. This information is not intended to replace advice given to you by your health care provider. Make sure you discuss any questions you have with your health care provider.

## 2015-03-15 NOTE — Assessment & Plan Note (Signed)
UA consistent with UTI. Symptoms consistent with patients most recent UTI. Well appearing. Benign abdominal exam. Vitals stable. Negative back exam. Does note some recent constipation and this could be causing some of her back discomfort and nausea. Suspect this is related to UTI. Will treat with keflex. Send for urine culture and micro. If back discomfort persists despite antibiotic treatment patient will return for follow-up. Given return precautions.

## 2015-03-15 NOTE — Progress Notes (Signed)
Patient ID: Deborah Mcconnell, female   DOB: October 27, 1950, 65 y.o.   MRN: [id number]  Tommi Rumps, MD Phone: [phone]  Deborah Mcconnell is a 64 y.o. female who presents today for same day appointment.   Patient presents with nausea and mild low back pain since Saturday. She notes the last time this occurred she had a UTI. She notes she has a low level of feeling poorly, but overall feels ok. Denies urinary urgency, frequency, dysuria, hematuria, vomiting, and diarrhea. Notes she had not had a bowel movement in 3 days until this morning. Had a normal BM this morning. Notes she had a gurgling in her stomach following the BM. Denies abdominal pain. No vaginal discharge. No fevers. No numbness. No weakness. No saddle anesthesia. No incontinence. Has history of breast cancer.   PMH: nonsmoker. History of UTI. Last culture grew e coli.   ROS see HPI  Objective  Physical Exam Filed Vitals:   03/15/15 0839  BP: 118/64  Pulse: 66  Temp: 98 F (36.7 C)   Physical Exam  Constitutional: She is well-developed, well-nourished, and in no distress.  Well appearing  HENT:  Head: Normocephalic and atraumatic.  Cardiovascular: Normal rate, regular rhythm and normal heart sounds.  Exam reveals no gallop and no friction rub.   No murmur heard. Pulmonary/Chest: Effort normal. No respiratory distress. She has no wheezes. She has no rales.  Abdominal: Soft. Bowel sounds are normal. She exhibits no distension. There is no tenderness. There is no rebound and no guarding.  Musculoskeletal: She exhibits no edema.  No midline spine TTP, no step off, no back muscle tenderness, no CVA tenderness  Neurological: She is alert.  5/5 strength in bilateral quads, hamstrings, plantar and dorsiflexion, sensation to light touch intact in bilateral LE, normal gait, 2+ patellar reflexes  Skin: Skin is warm and dry. She is not diaphoretic.     Assessment/Plan: Please see individual problem list.  UTI  (urinary tract infection) UA consistent with UTI. Symptoms consistent with patients most recent UTI. Well appearing. Benign abdominal exam. Vitals stable. Negative back exam. Does note some recent constipation and this could be causing some of her back discomfort and nausea. Suspect this is related to UTI. Will treat with keflex. Send for urine culture and micro. If back discomfort persists despite antibiotic treatment patient will return for follow-up. Given return precautions.      Orders Placed This Encounter  Procedures  . Urine Culture  . Urine Microscopic Only  . POCT Urinalysis Dipstick    Meds ordered this encounter  Medications  . cephALEXin (KEFLEX) 500 MG capsule    Sig: Take 1 capsule (500 mg total) by mouth 2 (two) times daily.    Dispense:  14 capsule    Refill:  0     Tommi Rumps

## 2015-03-15 NOTE — Progress Notes (Signed)
Pre visit review using our clinic review tool, if applicable. No additional management support is needed unless otherwise documented below in the visit note. 

## 2015-03-17 ENCOUNTER — Telehealth: Payer: Self-pay | Admitting: Family Medicine

## 2015-03-17 LAB — URINE CULTURE
Colony Count: NO GROWTH
ORGANISM ID, BACTERIA: NO GROWTH

## 2015-03-17 NOTE — Telephone Encounter (Signed)
Called patient to inform of negative urine culture. Advised to stop antibiotic. She notes the she still has similar symptoms to the day of her office visit, though her nausea is gone. She notes not having much of an appetite and not wanting to eat much. Feels the same low level of feeling poorly intermittently. She has not developed any new symptoms. She feels she may have a virus and would like to monitor this through the rest of the weekend and see how she feels. If not feeling better will follow up in the office next week.

## 2015-05-08 ENCOUNTER — Ambulatory Visit (INDEPENDENT_AMBULATORY_CARE_PROVIDER_SITE_OTHER)
Admission: RE | Admit: 2015-05-08 | Discharge: 2015-05-08 | Disposition: A | Discharge status: Home / Self Care | Payer: 59 | Source: Ambulatory Visit | Attending: Family Medicine | Admitting: Family Medicine

## 2015-05-08 ENCOUNTER — Encounter: Payer: Self-pay | Admitting: Family Medicine

## 2015-05-08 ENCOUNTER — Telehealth: Payer: Self-pay | Admitting: Family Medicine

## 2015-05-08 ENCOUNTER — Ambulatory Visit (INDEPENDENT_AMBULATORY_CARE_PROVIDER_SITE_OTHER): Payer: 59 | Admitting: Family Medicine

## 2015-05-08 VITALS — BP 110/60 | HR 88 | Temp 98.7°F | Resp 16 | Ht 71.5 in | Wt 161.8 lb

## 2015-05-08 DIAGNOSIS — R059 Cough, unspecified: Secondary | ICD-10-CM | POA: Insufficient documentation

## 2015-05-08 DIAGNOSIS — R1013 Epigastric pain: Secondary | ICD-10-CM | POA: Diagnosis not present

## 2015-05-08 DIAGNOSIS — R300 Dysuria: Secondary | ICD-10-CM

## 2015-05-08 DIAGNOSIS — R05 Cough: Secondary | ICD-10-CM

## 2015-05-08 DIAGNOSIS — R3129 Other microscopic hematuria: Secondary | ICD-10-CM | POA: Insufficient documentation

## 2015-05-08 DIAGNOSIS — R079 Chest pain, unspecified: Secondary | ICD-10-CM | POA: Diagnosis not present

## 2015-05-08 LAB — COMPREHENSIVE METABOLIC PANEL
ALBUMIN: 4.3 g/dL (ref 3.5–5.2)
ALK PHOS: 51 U/L (ref 39–117)
ALT: 13 U/L (ref 0–35)
AST: 20 U/L (ref 0–37)
BILIRUBIN TOTAL: 0.5 mg/dL (ref 0.2–1.2)
BUN: 10 mg/dL (ref 6–23)
CO2: 28 mEq/L (ref 19–32)
Calcium: 9.9 mg/dL (ref 8.4–10.5)
Chloride: 103 mEq/L (ref 96–112)
Creatinine, Ser: 0.68 mg/dL (ref 0.40–1.20)
GFR: 92.37 mL/min (ref >60.00)
Glucose, Bld: 90 mg/dL (ref 70–99)
POTASSIUM: 4.6 meq/L (ref 3.5–5.1)
SODIUM: 141 meq/L (ref 135–145)
TOTAL PROTEIN: 6.9 g/dL (ref 6.0–8.3)

## 2015-05-08 LAB — URINALYSIS, MICROSCOPIC ONLY

## 2015-05-08 LAB — POCT URINALYSIS DIPSTICK
Bilirubin, UA: NEGATIVE
GLUCOSE UA: NEGATIVE
KETONES UA: NEGATIVE
Leukocytes, UA: NEGATIVE
Nitrite, UA: NEGATIVE
Protein, UA: NEGATIVE
SPEC GRAV UA: 1.02
Urobilinogen, UA: 0.2
pH, UA: 5.5

## 2015-05-08 LAB — H. PYLORI ANTIBODY, IGG: H Pylori IgG: NEGATIVE

## 2015-05-08 LAB — LIPASE: LIPASE: 24 U/L (ref 11.0–59.0)

## 2015-05-08 MED ORDER — CEFPODOXIME PROXETIL 200 MG PO TABS: 200.0000 mg | ORAL_TABLET | Freq: Two times a day (BID) | ORAL | Status: DC

## 2015-05-08 MED ORDER — OMEPRAZOLE 20 MG PO CPDR: 20.0000 mg | DELAYED_RELEASE_CAPSULE | Freq: Every day | ORAL | Status: DC

## 2015-05-08 MED ORDER — DOXYCYCLINE HYCLATE 100 MG PO TABS: 100.0000 mg | ORAL_TABLET | Freq: Two times a day (BID) | ORAL | Status: DC

## 2015-05-08 NOTE — Assessment & Plan Note (Signed)
Noted on UA today in follow-up of prior gross hematuria and dysuria that resolved with taking antibiotics that she previously had been given. We will send this for micro-and culture. If it does indeed reveal rbc's in the micro-and the culture is negative she will need evaluation by urology.

## 2015-05-08 NOTE — Assessment & Plan Note (Addendum)
Patient with 4-5 week history of epigastric discomfort and acid reflux. She also notes associated spasming sensation in her epigastric area and into her chest. These symptoms are typically associated with eating. Did do an EKG today given that this sensation has occurred in her chest and EKG was reassuring. Sensation is atypical in nature given lack of exertional component, diaphoresis, and sensation of spasm. This in combination with normal EKG makes cardiac cause unlikely. She did have mild epigastric tenderness on exam, there is no peritoneal signs and no mass palpated. Suspect this is likely related to gastritis/GERD with risedronate as a possible contributor. Unlikely to be vascular issue given equal pedal pulses and normal blood pressure. She was advised to discontinue risedronate and contact her endocrinologist for further management of her osteoporosis. We will start her on Prilosec daily for this. I did discuss evaluation with a cardiologist given discomfort does occur in chest, though patient declined this preferring to move in a stepwise manner. We will additionally check a lipase, CMP, and H. pylori IgG to evaluate for additional causes. She is given return precautions.

## 2015-05-08 NOTE — Telephone Encounter (Signed)
Called and advised patient of chest x-ray results. Advised that she has a right lower lobe pneumonia. We will treat her with Cefpodoxime and doxycycline given her recent exposure to Keflex. She's allergic to fluoroquinolones. She has had nausea and vomiting with Augmentin previously, though has tolerated cephalosporins with no issue. She's not had an anaphylactoid reaction to penicillins or cephalosporins in the past. She was advised on hygiene and staying out of work for at least 48 hours.

## 2015-05-08 NOTE — Patient Instructions (Signed)
Nice to see you. We will start you on Prilosec for gastritis which is irritation of her stomach. We will check a chest x-ray given her cough. If this reveals an infection we will start her on an antibiotic. We will also check lab work today and call with results. If you develop chest pain, shortness of breath, abdominal pain, nausea, vomiting, diarrhea, and burning with urination, vaginal bleeding, fever, any new or change in symptoms please seek medical attention immediately.

## 2015-05-08 NOTE — Progress Notes (Signed)
Pre visit review using our clinic review tool, if applicable. No additional management support is needed unless otherwise documented below in the visit note. 

## 2015-05-08 NOTE — Progress Notes (Signed)
Patient ID: Deborah Mcconnell, female   DOB: [birth year], 64 y.o.   MRN: [id number]  Deborah Rumps, MD Phone: [phone]  Deborah Mcconnell is a 64 y.o. female who presents today for same-day appointment.  Cough: Patient notes for the past week she has had a cough. Initially felt like she was going to start to develop an upper respiratory infection, though this never materialized. Notes it stayed in her throat and lower respiratory area. She notes intermittently some production with this. She notes scratchy throat with it. She denies fever or shortness of breath with this. She does note for the last 4-5 weeks she's had intermittent acid reflux. She notes at the top of her stomach and lower breastbone she has an achy sensation that can then develop into spasming sensation in her epigastric region and esophagus. She notes occasionally this will radiate to her back. Last for 30 seconds to 5 minutes. She does note some nausea with this. She does note she is anxious when this occurs. Has had some shortness of breath with it, though feels this is related to her anxiety. Has not had any of this since last night. Does not happen every day. It is not exertional. Typically occurs after eating. She notes the first time this occurred she felt as though she was having a heart attack, though she did not get evaluated with this and her symptoms resolved on their own. She denies any recent travel, surgeries, hemoptysis, tachycardia, and history of DVT/PE. She has had one episode where she blew blood streaked sputum out of her nose. She's not had any swelling in her legs. She does have a history of breast cancer and is on letrozole. She has no chest pain or shortness of breath at this time. She does not have her gallbladder. She does note several weeks ago she had blood in her urine and dysuria following being seen in the office for UTI. She had a negative urine culture about a week and a half to 2 weeks prior to this  occurrence. She had stopped the antibiotics after the urine culture came back negative, then went on to develop symptoms about a week and a half later. She notes she started back on the antibiotic and has no symptoms at this time. She has no lower abdominal or pelvic complaints. She has no vaginal discharge. No family history of heart disease.  PMH: nonsmoker.   ROS see history of present illness  Objective  Physical Exam Filed Vitals:   05/08/15 1014  BP: 110/60  Pulse: 88  Temp: 98.7 F (37.1 C)  Resp: 16    Physical Exam  Constitutional: She is well-developed, well-nourished, and in no distress.  HENT:  Head: Normocephalic and atraumatic.  Right Ear: External ear normal.  Left Ear: External ear normal.  Mouth/Throat: Oropharynx is clear and moist. No oropharyngeal exudate.  Normal TMs bilaterally  Eyes: Conjunctivae are normal. Pupils are equal, round, and reactive to light.  Neck: Neck supple.  Cardiovascular: Normal rate and regular rhythm.  Exam reveals no gallop and no friction rub.   No murmur heard. Pulmonary/Chest: Effort normal.  Right lower lung field with crackles, otherwise normal breath sounds  Abdominal: Soft. Bowel sounds are normal. She exhibits no distension. There is tenderness (mild epigastric tenderness, no other tenderness). There is no rebound and no guarding.  Musculoskeletal: She exhibits no edema.  No calf swelling, no cords in her calves, no calf tenderness  Lymphadenopathy:    She has no  cervical adenopathy.  Neurological: She is alert. Gait normal.  Skin: Skin is warm and dry. She is not diaphoretic.   EKG: Normal sinus rhythm, rate 71, RSR prime in V1, no ST or T-wave changes  Assessment/Plan: Please see individual problem list.  Cough Patient with one-week history of cough. This is likely related to viral bronchitis versus reflux given her history and symptoms, though she does have right lower lung crackles. Unlikely to be related to PE  given Wells score of 1, normal heart rate, respiratory rate, normal oxygen saturation, lack of history of VTE, and no signs of DVT on exam. Unlikely URI given lack of symptoms. We will obtain a chest x-ray to evaluate the crackles further. We will start her on PPI for reflux. She will stop her risedronate. She will contact her endocrinologist to discuss future treatment of her osteoporosis. Supportive treatment as well. Given return precautions.  Epigastric discomfort Patient with 4-5 week history of epigastric discomfort and acid reflux. She also notes associated spasming sensation in her epigastric area and into her chest. These symptoms are typically associated with eating. Did do an EKG today given that this sensation has occurred in her chest and EKG was reassuring. Sensation is atypical in nature given lack of exertional component, diaphoresis, and sensation of spasm. This in combination with normal EKG makes cardiac cause unlikely. She did have mild epigastric tenderness on exam, there is no peritoneal signs and no mass palpated. Suspect this is likely related to gastritis/GERD with risedronate as a possible contributor. Unlikely to be vascular issue given equal pedal pulses and normal blood pressure. She was advised to discontinue risedronate and contact her endocrinologist for further management of her osteoporosis. We will start her on Prilosec daily for this. I did discuss evaluation with a cardiologist given discomfort does occur in chest, though patient declined this preferring to move in a stepwise manner. We will additionally check a lipase, CMP, and H. pylori IgG to evaluate for additional causes. She is given return precautions.  Microscopic hematuria Noted on UA today in follow-up of prior gross hematuria and dysuria that resolved with taking antibiotics that she previously had been given. We will send this for micro-and culture. If it does indeed reveal rbc's in the micro-and the culture is  negative she will need evaluation by urology.    Orders Placed This Encounter  Procedures  . Urine Culture  . DG Chest 2 View    Standing Status: Future     Number of Occurrences:      Standing Expiration Date: 07/07/2016    Order Specific Question:  Reason for Exam (SYMPTOM  OR DIAGNOSIS REQUIRED)    Answer:  cough, right lower lung field crackles    Order Specific Question:  Preferred imaging location?    Answer:  [place] ([facility])  . Lipase  . Urine Microscopic Only  . H. pylori antibody, IgG  . POCT Urinalysis Dipstick  . EKG 12-Lead    Meds ordered this encounter  Medications  . omeprazole (PRILOSEC) 20 MG capsule    Sig: Take 1 capsule (20 mg total) by mouth daily.    Dispense:  30 capsule    Refill:  [place]

## 2015-05-08 NOTE — Assessment & Plan Note (Signed)
Patient with one-week history of cough. This is likely related to viral bronchitis versus reflux given her history and symptoms, though she does have right lower lung crackles. Unlikely to be related to PE given Wells score of 1, normal heart rate, respiratory rate, normal oxygen saturation, lack of history of VTE, and no signs of DVT on exam. Unlikely URI given lack of symptoms. We will obtain a chest x-ray to evaluate the crackles further. We will start her on PPI for reflux. She will stop her risedronate. She will contact her endocrinologist to discuss future treatment of her osteoporosis. Supportive treatment as well. Given return precautions.

## 2015-05-10 LAB — URINE CULTURE

## 2015-05-11 ENCOUNTER — Encounter: Payer: 59 | Admitting: Internal Medicine

## 2015-05-15 ENCOUNTER — Telehealth: Payer: Self-pay | Admitting: *Deleted

## 2015-05-15 ENCOUNTER — Other Ambulatory Visit: Payer: 59

## 2015-05-15 ENCOUNTER — Ambulatory Visit (INDEPENDENT_AMBULATORY_CARE_PROVIDER_SITE_OTHER): Payer: 59 | Admitting: Internal Medicine

## 2015-05-15 ENCOUNTER — Encounter: Payer: Self-pay | Admitting: Internal Medicine

## 2015-05-15 VITALS — BP 105/66 | HR 71 | Temp 98.0°F | Ht 71.5 in | Wt 164.2 lb

## 2015-05-15 DIAGNOSIS — M8580 Other specified disorders of bone density and structure, unspecified site: Secondary | ICD-10-CM

## 2015-05-15 DIAGNOSIS — J189 Pneumonia, unspecified organism: Secondary | ICD-10-CM

## 2015-05-15 DIAGNOSIS — R1013 Epigastric pain: Secondary | ICD-10-CM

## 2015-05-15 DIAGNOSIS — R109 Unspecified abdominal pain: Secondary | ICD-10-CM | POA: Insufficient documentation

## 2015-05-15 DIAGNOSIS — Z0000 Encounter for general adult medical examination without abnormal findings: Secondary | ICD-10-CM | POA: Diagnosis not present

## 2015-05-15 DIAGNOSIS — J181 Lobar pneumonia, unspecified organism: Secondary | ICD-10-CM

## 2015-05-15 LAB — LIPID PANEL
CHOLESTEROL: 206 mg/dL — AB (ref 0–200)
HDL: 50.5 mg/dL (ref >39.00)
LDL CALC: 134 mg/dL — AB (ref 0–99)
NonHDL: 155.68
TRIGLYCERIDES: 109 mg/dL (ref 0.0–149.0)
Total CHOL/HDL Ratio: 4
VLDL: 21.8 mg/dL (ref 0.0–40.0)

## 2015-05-15 LAB — CBC WITH DIFFERENTIAL/PLATELET
BASOS ABS: 0.1 10*3/uL (ref 0.0–0.1)
BASOS PCT: 1.5 % (ref 0.0–3.0)
EOS ABS: 0.2 10*3/uL (ref 0.0–0.7)
Eosinophils Relative: 2.8 % (ref 0.0–5.0)
HCT: 41.8 % (ref 36.0–46.0)
Hemoglobin: 14.1 g/dL (ref 12.0–15.0)
LYMPHS ABS: 1.6 10*3/uL (ref 0.7–4.0)
Lymphocytes Relative: 25.1 % (ref 12.0–46.0)
MCHC: 33.6 g/dL (ref 30.0–36.0)
MCV: 94.6 fl (ref 78.0–100.0)
MONO ABS: 0.5 10*3/uL (ref 0.1–1.0)
Monocytes Relative: 8.6 % (ref 3.0–12.0)
NEUTROS ABS: 3.9 10*3/uL (ref 1.4–7.7)
NEUTROS PCT: 62 % (ref 43.0–77.0)
PLATELETS: 341 10*3/uL (ref 150.0–400.0)
RBC: 4.42 Mil/uL (ref 3.87–5.11)
RDW: 12.8 % (ref 11.5–15.5)
WBC: 6.4 10*3/uL (ref 4.0–10.5)

## 2015-05-15 LAB — VITAMIN D 25 HYDROXY (VIT D DEFICIENCY, FRACTURES): VITD: 23.18 ng/mL — AB (ref 30.00–100.00)

## 2015-05-15 LAB — COMPREHENSIVE METABOLIC PANEL
ALK PHOS: 50 U/L (ref 39–117)
ALT: 11 U/L (ref 0–35)
AST: 18 U/L (ref 0–37)
Albumin: 4.1 g/dL (ref 3.5–5.2)
BUN: 12 mg/dL (ref 6–23)
CHLORIDE: 103 meq/L (ref 96–112)
CO2: 29 mEq/L (ref 19–32)
Calcium: 9.2 mg/dL (ref 8.4–10.5)
Creatinine, Ser: 0.64 mg/dL (ref 0.40–1.20)
GFR: 99.06 mL/min (ref >60.00)
Glucose, Bld: 91 mg/dL (ref 70–99)
POTASSIUM: 3.8 meq/L (ref 3.5–5.1)
SODIUM: 140 meq/L (ref 135–145)
Total Bilirubin: 0.5 mg/dL (ref 0.2–1.2)
Total Protein: 6.6 g/dL (ref 6.0–8.3)

## 2015-05-15 LAB — TSH: TSH: 1.25 u[IU]/mL (ref 0.35–4.50)

## 2015-05-15 NOTE — Telephone Encounter (Signed)
Pt states she is in a hurry and  has to pick up grandson, that she will come back later to do the breath test

## 2015-05-15 NOTE — Assessment & Plan Note (Signed)
Episodic, severe abdominal pain. We discussed potential etiologies including ulceration, gastritis. Given episodic severe nature, would also consider gastric torsion and volvulus. Will stop Risendronate. Test for H. Pylori. Continue Omeprazole. Set up GI evaluation for possible EGD. Recommended she seek immediate care for imaging if she has a recurrent episode. Follow up here in 4 weeks and prn.

## 2015-05-15 NOTE — Assessment & Plan Note (Signed)
Symptoms improving. Finish antibiotics. Follow up CXR in 3 weeks. Follow up visit in 4 weeks.

## 2015-05-15 NOTE — Progress Notes (Signed)
Subjective:    Patient ID: Deborah Mcconnell, female    DOB: [birth year], 64 y.o.   MRN: [id number]  HPI  64YO female presents for physical exam.  Recently treated for pneumonia with doxycycline by Dr. Caryl Bis. Feeling better. Continues to have some dry cough. Some fatigue, but generally feeling better. Finishes antibiotics today.  Seen by Dr. Gale Journey in 03/2015. Scheduled for bone density next year.  Seen by Oncology at [facility] and mammogram was normal. Follow up is scheduled for 10/2015.  Abdominal pain - Recently taken off Fosamax because of spasms in chest. Started on Risendronate. Seen by Dr. Caryl Bis. Started on Omeprazole. Continues to have epigastric pain, described as severe with muscle spasm. This radiates to back and up to throat. Described as severe pain when this occurs. Unclear what triggers episodes. Sometimes seems worse with drinking carbonated beverates. Was concerned this might be a heart attack, however no left sided chest pain, pain with exertion, palpitations. Feels sick with decreased appetite, nausea in between episodes of spasms. Last episode 2-3 weeks ago. Episode last several minutes. Resolve without intervention. No constipation or diarrhea. No vomiting. No urinary symptoms.    Wt Readings from Last 3 Encounters:  05/15/15 164 lb 4 oz (74.503 kg)  05/08/15 161 lb 12.8 oz (73.392 kg)  03/15/15 166 lb 9.6 oz (75.569 kg)   BP Readings from Last 3 Encounters:  05/15/15 105/66  05/08/15 110/60  03/15/15 118/64    Past Medical History  Diagnosis Date  . Vertigo   . Hyperlipidemia   . Osteopenia     previously taking Evista and Actonel  . Osteoporosis   . Cancer [facility])    Family History  Problem Relation Age of Onset  . Osteoporosis Mother   . Osteoporosis Sister   . Hypertension Mother   . Osteoarthritis Mother   . Osteoarthritis Sister   . Osteoarthritis Maternal Grandmother   . Diabetes Maternal Grandfather   . Cancer Paternal Grandfather    unknown   Past Surgical History  Procedure Laterality Date  . Cholecystectomy    . Lt breast lumpectomy     Social History   Social History  . Marital Status: Married    Spouse Name: N/A  . Number of Children: N/A  . Years of Education: N/A   Social History Main Topics  . Smoking status: Never Smoker   . Smokeless tobacco: Never Used  . Alcohol Use: 0.0 oz/week    0 Standard drinks or equivalent per week     Comment: rare  . Drug Use: No  . Sexual Activity: Yes   Other Topics Concern  . None   Social History Narrative    Review of Systems  Constitutional: Negative for fever, chills, appetite change, fatigue and unexpected weight change.  Eyes: Negative for visual disturbance.  Respiratory: Positive for cough. Negative for chest tightness, shortness of breath and wheezing.   Cardiovascular: Negative for chest pain, palpitations and leg swelling.  Gastrointestinal: Positive for nausea and abdominal pain. Negative for vomiting, diarrhea, constipation and abdominal distention.  Musculoskeletal: Negative for myalgias and arthralgias.  Skin: Negative for color change and rash.  Neurological: Negative for weakness.  Hematological: Negative for adenopathy. Does not bruise/bleed easily.  Psychiatric/Behavioral: Negative for sleep disturbance and dysphoric mood. The patient is not nervous/anxious.        Objective:    BP 105/66 mmHg  Pulse 71  Temp(Src) 98 F (36.7 C) (Oral)  Ht 5' 11.5" (1.816 m)  Wt 164 lb  4 oz (74.503 kg)  BMI 22.59 kg/m2  SpO2 97% Physical Exam  Constitutional: She is oriented to person, place, and time. She appears well-developed and well-nourished. No distress.  HENT:  Head: Normocephalic and atraumatic.  Right Ear: External ear normal.  Left Ear: External ear normal.  Nose: Nose normal.  Mouth/Throat: Oropharynx is clear and moist. No oropharyngeal exudate.  Eyes: Conjunctivae are normal. Pupils are equal, round, and reactive to light. Right  eye exhibits no discharge. Left eye exhibits no discharge. No scleral icterus.  Neck: Normal range of motion. Neck supple. No tracheal deviation present. No thyromegaly present.  Cardiovascular: Normal rate, regular rhythm, normal heart sounds and intact distal pulses.  Exam reveals no gallop and no friction rub.   No murmur heard. Pulmonary/Chest: Effort normal and breath sounds normal. No accessory muscle usage. No tachypnea. No respiratory distress. She has no decreased breath sounds. She has no wheezes. She has no rales. She exhibits no tenderness. Right breast exhibits no inverted nipple, no mass, no nipple discharge, no skin change and no tenderness. Left breast exhibits no inverted nipple, no mass, no nipple discharge, no skin change and no tenderness. Breasts are symmetrical.  Abdominal: Soft. Bowel sounds are normal. She exhibits no distension and no mass. There is no tenderness. There is no rebound and no guarding.  Musculoskeletal: Normal range of motion. She exhibits no edema or tenderness.  Lymphadenopathy:    She has no cervical adenopathy.  Neurological: She is alert and oriented to person, place, and time. No cranial nerve deficit. She exhibits normal muscle tone. Coordination normal.  Skin: Skin is warm and dry. No rash noted. She is not diaphoretic. No erythema. No pallor.  Psychiatric: She has a normal mood and affect. Her behavior is normal. Judgment and thought content normal.          Assessment & Plan:   Problem List Items Addressed This Visit      Unprioritized   Abdominal pain    Episodic, severe abdominal pain. We discussed potential etiologies including ulceration, gastritis. Given episodic severe nature, would also consider gastric torsion and volvulus. Will stop Risendronate. Test for H. Pylori. Continue Omeprazole. Set up GI evaluation for possible EGD. Recommended she seek immediate care for imaging if she has a recurrent episode. Follow up here in 4 weeks and  prn.      Relevant Orders   H. pylori breath test   Ambulatory referral to Gastroenterology   Osteopenia    Reviewed notes from Dr. Gale Journey. Advised her to stop Risendronate for now, given abdominal pain and concern for ulcer or gastritis. Will make decision on further treatment of osteoporosis after etiology of abdominal pain determined.      Right lower lobe pneumonia    Symptoms improving. Finish antibiotics. Follow up CXR in 3 weeks. Follow up visit in 4 weeks.      Relevant Orders   DG Chest 2 View   Routine general medical examination at a health care facility - Primary    General medical exam normal today including breast exam. PAP and pelvic deferred as normal, HPV neg in 2014. Plan repeat in 2017. Mammogram UTD and reviewed. GI referral placed for EGD and colonoscopy. Labs today. Flu vaccine at 1 month follow up after pneumonia resolved.       Relevant Orders   TSH   CBC with Differential/Platelet   Comprehensive metabolic panel   Lipid panel   VITAMIN D 25 Hydroxy (Vit-D Deficiency, Fractures)  POCT urinalysis dipstick       Return in about 4 weeks (around 06/12/2015) for Recheck.

## 2015-05-15 NOTE — Assessment & Plan Note (Signed)
General medical exam normal today including breast exam. PAP and pelvic deferred as normal, HPV neg in 2014. Plan repeat in 2017. Mammogram UTD and reviewed. GI referral placed for EGD and colonoscopy. Labs today. Flu vaccine at 1 month follow up after pneumonia resolved.

## 2015-05-15 NOTE — Assessment & Plan Note (Signed)
Reviewed notes from Dr. Gale Journey. Advised her to stop Risendronate for now, given abdominal pain and concern for ulcer or gastritis. Will make decision on further treatment of osteoporosis after etiology of abdominal pain determined.

## 2015-05-15 NOTE — Patient Instructions (Addendum)
Repeat chest xray first week of December. We will plan for flu vaccine after chest xray.  Stop Risedronate.  We will check for H. Pylori today and set up GI evaluation.  Health Maintenance, Female Adopting a healthy lifestyle and getting preventive care can go a long way to promote health and wellness. Talk with your health care provider about what schedule of regular examinations is right for you. This is a good chance for you to check in with your provider about disease prevention and staying healthy. In between checkups, there are plenty of things you can do on your own. Experts have done a lot of research about which lifestyle changes and preventive measures are most likely to keep you healthy. Ask your health care provider for more information. WEIGHT AND DIET  Eat a healthy diet  Be sure to include plenty of vegetables, fruits, low-fat dairy products, and lean protein.  Do not eat a lot of foods high in solid fats, added sugars, or salt.  Get regular exercise. This is one of the most important things you can do for your health.  Most adults should exercise for at least 150 minutes each week. The exercise should increase your heart rate and make you sweat (moderate-intensity exercise).  Most adults should also do strengthening exercises at least twice a week. This is in addition to the moderate-intensity exercise.  Maintain a healthy weight  Body mass index (BMI) is a measurement that can be used to identify possible weight problems. It estimates body fat based on height and weight. Your health care provider can help determine your BMI and help you achieve or maintain a healthy weight.  For females 47 years of age and older:   A BMI below 18.5 is considered underweight.  A BMI of 18.5 to 24.9 is normal.  A BMI of 25 to 29.9 is considered overweight.  A BMI of 30 and above is considered obese.  Watch levels of cholesterol and blood lipids  You should start having your blood  tested for lipids and cholesterol at 64 years of age, then have this test every 5 years.  You may need to have your cholesterol levels checked more often if:  Your lipid or cholesterol levels are high.  You are older than 64 years of age.  You are at high risk for heart disease.  CANCER SCREENING   Lung Cancer  Lung cancer screening is recommended for adults 59-75 years old who are at high risk for lung cancer because of a history of smoking.  A yearly low-dose CT scan of the lungs is recommended for people who:  Currently smoke.  Have quit within the past 15 years.  Have at least a 30-pack-year history of smoking. A pack year is smoking an average of one pack of cigarettes a day for 1 year.  Yearly screening should continue until it has been 15 years since you quit.  Yearly screening should stop if you develop a health problem that would prevent you from having lung cancer treatment.  Breast Cancer  Practice breast self-awareness. This means understanding how your breasts normally appear and feel.  It also means doing regular breast self-exams. Let your health care provider know about any changes, no matter how small.  If you are in your 20s or 30s, you should have a clinical breast exam (CBE) by a health care provider every 1-3 years as part of a regular health exam.  If you are 81 or older, have a CBE  every year. Also consider having a breast X-ray (mammogram) every year.  If you have a family history of breast cancer, talk to your health care provider about genetic screening.  If you are at high risk for breast cancer, talk to your health care provider about having an MRI and a mammogram every year.  Breast cancer gene (BRCA) assessment is recommended for women who have family members with BRCA-related cancers. BRCA-related cancers include:  Breast.  Ovarian.  Tubal.  Peritoneal cancers.  Results of the assessment will determine the need for genetic counseling  and BRCA1 and BRCA2 testing. Cervical Cancer Your health care provider may recommend that you be screened regularly for cancer of the pelvic organs (ovaries, uterus, and vagina). This screening involves a pelvic examination, including checking for microscopic changes to the surface of your cervix (Pap test). You may be encouraged to have this screening done every 3 years, beginning at age 54.  For women ages 62-65, health care providers may recommend pelvic exams and Pap testing every 3 years, or they may recommend the Pap and pelvic exam, combined with testing for human papilloma virus (HPV), every 5 years. Some types of HPV increase your risk of cervical cancer. Testing for HPV may also be done on women of any age with unclear Pap test results.  Other health care providers may not recommend any screening for nonpregnant women who are considered low risk for pelvic cancer and who do not have symptoms. Ask your health care provider if a screening pelvic exam is right for you.  If you have had past treatment for cervical cancer or a condition that could lead to cancer, you need Pap tests and screening for cancer for at least 20 years after your treatment. If Pap tests have been discontinued, your risk factors (such as having a new sexual partner) need to be reassessed to determine if screening should resume. Some women have medical problems that increase the chance of getting cervical cancer. In these cases, your health care provider may recommend more frequent screening and Pap tests. Colorectal Cancer  This type of cancer can be detected and often prevented.  Routine colorectal cancer screening usually begins at 64 years of age and continues through 64 years of age.  Your health care provider may recommend screening at an earlier age if you have risk factors for colon cancer.  Your health care provider may also recommend using home test kits to check for hidden blood in the stool.  A small camera  at the end of a tube can be used to examine your colon directly (sigmoidoscopy or colonoscopy). This is done to check for the earliest forms of colorectal cancer.  Routine screening usually begins at age 4.  Direct examination of the colon should be repeated every 5-10 years through 64 years of age. However, you may need to be screened more often if early forms of precancerous polyps or small growths are found. Skin Cancer  Check your skin from head to toe regularly.  Tell your health care provider about any new moles or changes in moles, especially if there is a change in a mole's shape or color.  Also tell your health care provider if you have a mole that is larger than the size of a pencil eraser.  Always use sunscreen. Apply sunscreen liberally and repeatedly throughout the day.  Protect yourself by wearing long sleeves, pants, a wide-brimmed hat, and sunglasses whenever you are outside. HEART DISEASE, DIABETES, AND HIGH BLOOD PRESSURE  High blood pressure causes heart disease and increases the risk of stroke. High blood pressure is more likely to develop in:  People who have blood pressure in the high end of the normal range (130-139/85-89 mm Hg).  People who are overweight or obese.  People who are African American.  If you are 18-39 years of age, have your blood pressure checked every 3-5 years. If you are 40 years of age or older, have your blood pressure checked every year. You should have your blood pressure measured twice--once when you are at a hospital or clinic, and once when you are not at a hospital or clinic. Record the average of the two measurements. To check your blood pressure when you are not at a hospital or clinic, you can use:  An automated blood pressure machine at a pharmacy.  A home blood pressure monitor.  If you are between 55 years and 79 years old, ask your health care provider if you should take aspirin to prevent strokes.  Have regular diabetes  screenings. This involves taking a blood sample to check your fasting blood sugar level.  If you are at a normal weight and have a low risk for diabetes, have this test once every three years after 64 years of age.  If you are overweight and have a high risk for diabetes, consider being tested at a younger age or more often. PREVENTING INFECTION  Hepatitis B  If you have a higher risk for hepatitis B, you should be screened for this virus. You are considered at high risk for hepatitis B if:  You were born in a country where hepatitis B is common. Ask your health care provider which countries are considered high risk.  Your parents were born in a high-risk country, and you have not been immunized against hepatitis B (hepatitis B vaccine).  You have HIV or AIDS.  You use needles to inject street drugs.  You live with someone who has hepatitis B.  You have had sex with someone who has hepatitis B.  You get hemodialysis treatment.  You take certain medicines for conditions, including cancer, organ transplantation, and autoimmune conditions. Hepatitis C  Blood testing is recommended for:  Everyone born from 1945 through 1965.  Anyone with known risk factors for hepatitis C. Sexually transmitted infections (STIs)  You should be screened for sexually transmitted infections (STIs) including gonorrhea and chlamydia if:  You are sexually active and are younger than 64 years of age.  You are older than 64 years of age and your health care provider tells you that you are at risk for this type of infection.  Your sexual activity has changed since you were last screened and you are at an increased risk for chlamydia or gonorrhea. Ask your health care provider if you are at risk.  If you do not have HIV, but are at risk, it may be recommended that you take a prescription medicine daily to prevent HIV infection. This is called pre-exposure prophylaxis (PrEP). You are considered at risk  if:  You are sexually active and do not regularly use condoms or know the HIV status of your partner(s).  You take drugs by injection.  You are sexually active with a partner who has HIV. Talk with your health care provider about whether you are at high risk of being infected with HIV. If you choose to begin PrEP, you should first be tested for HIV. You should then be tested every 3 months for as   long as you are taking PrEP.  PREGNANCY   If you are premenopausal and you may become pregnant, ask your health care provider about preconception counseling.  If you may become pregnant, take 400 to 800 micrograms (mcg) of folic acid every day.  If you want to prevent pregnancy, talk to your health care provider about birth control (contraception). OSTEOPOROSIS AND MENOPAUSE   Osteoporosis is a disease in which the bones lose minerals and strength with aging. This can result in serious bone fractures. Your risk for osteoporosis can be identified using a bone density scan.  If you are 30 years of age or older, or if you are at risk for osteoporosis and fractures, ask your health care provider if you should be screened.  Ask your health care provider whether you should take a calcium or vitamin D supplement to lower your risk for osteoporosis.  Menopause may have certain physical symptoms and risks.  Hormone replacement therapy may reduce some of these symptoms and risks. Talk to your health care provider about whether hormone replacement therapy is right for you.  HOME CARE INSTRUCTIONS   Schedule regular health, dental, and eye exams.  Stay current with your immunizations.   Do not use any tobacco products including cigarettes, chewing tobacco, or electronic cigarettes.  If you are pregnant, do not drink alcohol.  If you are breastfeeding, limit how much and how often you drink alcohol.  Limit alcohol intake to no more than 1 drink per day for nonpregnant women. One drink equals 12  ounces of beer, 5 ounces of wine, or 1 ounces of hard liquor.  Do not use street drugs.  Do not share needles.  Ask your health care provider for help if you need support or information about quitting drugs.  Tell your health care provider if you often feel depressed.  Tell your health care provider if you have ever been abused or do not feel safe at home.   This information is not intended to replace advice given to you by your health care provider. Make sure you discuss any questions you have with your health care provider.   Document Released: 12/30/2010 Document Revised: 07/07/2014 Document Reviewed: 05/18/2013 Elsevier Interactive Patient Education [employer].

## 2015-05-15 NOTE — Progress Notes (Signed)
Pre visit review using our clinic review tool, if applicable. No additional management support is needed unless otherwise documented below in the visit note. 

## 2015-05-16 LAB — H. PYLORI BREATH TEST: H. PYLORI BREATH TEST: NOT DETECTED

## 2015-06-13 ENCOUNTER — Encounter: Payer: Self-pay | Admitting: Internal Medicine

## 2015-06-13 ENCOUNTER — Ambulatory Visit (INDEPENDENT_AMBULATORY_CARE_PROVIDER_SITE_OTHER): Payer: BLUE CROSS/BLUE SHIELD | Admitting: Internal Medicine

## 2015-06-13 VITALS — BP 93/60 | HR 79 | Temp 98.3°F | Ht 71.5 in | Wt 165.4 lb

## 2015-06-13 DIAGNOSIS — M8580 Other specified disorders of bone density and structure, unspecified site: Secondary | ICD-10-CM | POA: Diagnosis not present

## 2015-06-13 DIAGNOSIS — R1013 Epigastric pain: Secondary | ICD-10-CM | POA: Diagnosis not present

## 2015-06-13 DIAGNOSIS — J189 Pneumonia, unspecified organism: Secondary | ICD-10-CM | POA: Diagnosis not present

## 2015-06-13 DIAGNOSIS — J181 Lobar pneumonia, unspecified organism: Secondary | ICD-10-CM

## 2015-06-13 DIAGNOSIS — Z23 Encounter for immunization: Secondary | ICD-10-CM | POA: Diagnosis not present

## 2015-06-13 NOTE — Patient Instructions (Addendum)
Please go for chest xray today to follow up pneumonia.  Follow up with Dr. Gale Journey regarding medication for osteoporosis.  We will test for Hep C with labs at next visit.

## 2015-06-13 NOTE — Assessment & Plan Note (Signed)
Unable to tolerate both Actonel and Fosamax. Discussed some options including Reclast and Prolia. She will consider these and follow up with Dr. Gale Journey to discuss. Continue Vit D.

## 2015-06-13 NOTE — Progress Notes (Signed)
Subjective:    Patient ID: Deborah Mcconnell, female    DOB: 1951/03/27, 64 y.o.   MRN: [id number]  HPI  64YO female presents for follow up.  Last seen in 05/2015 for epigastric abdominal pain, right lower lobe pneumonia and physical exam. Then, seen at [facility] 11/25 with persistent cough. Treated with Omnicef and Azithromycin.   Epigastric pain - Testing for H. Pylori was negative. Referral was placed to GI. She opted not to schedule this visit. Her symptoms completely resolved after stopping Risedronate.   Osteoporosis - She has been unable to tolerate both Fosamax and Actonel. She plans to discuss alternative meds with her endocrinologist, Dr. Gale Journey at her followup. She is exercising and takes vit d.    Wt Readings from Last 3 Encounters:  06/13/15 165 lb 6 oz (75.014 kg)  05/15/15 164 lb 4 oz (74.503 kg)  05/08/15 161 lb 12.8 oz (73.392 kg)   BP Readings from Last 3 Encounters:  06/13/15 93/60  05/15/15 105/66  05/08/15 110/60    Past Medical History  Diagnosis Date  . Vertigo   . Hyperlipidemia   . Osteopenia     previously taking Evista and Actonel  . Osteoporosis   . Cancer [facility])    Family History  Problem Relation Age of Onset  . Osteoporosis Mother   . Osteoporosis Sister   . Hypertension Mother   . Osteoarthritis Mother   . Osteoarthritis Sister   . Osteoarthritis Maternal Grandmother   . Diabetes Maternal Grandfather   . Cancer Paternal Grandfather     unknown   Past Surgical History  Procedure Laterality Date  . Cholecystectomy    . Lt breast lumpectomy     Social History   Social History  . Marital Status: Married    Spouse Name: N/A  . Number of Children: N/A  . Years of Education: N/A   Social History Main Topics  . Smoking status: Never Smoker   . Smokeless tobacco: Never Used  . Alcohol Use: 0.0 oz/week    0 Standard drinks or equivalent per week     Comment: rare  . Drug Use: No  . Sexual Activity: Yes   Other Topics  Concern  . Not on file   Social History Narrative    Review of Systems  Constitutional: Negative for fever, chills, appetite change, fatigue and unexpected weight change.  Eyes: Negative for visual disturbance.  Respiratory: Negative for shortness of breath.   Cardiovascular: Negative for chest pain and leg swelling.  Gastrointestinal: Negative for nausea, vomiting, abdominal pain, diarrhea, constipation, blood in stool and abdominal distention.  Skin: Negative for color change and rash.  Hematological: Negative for adenopathy. Does not bruise/bleed easily.  Psychiatric/Behavioral: Negative for dysphoric mood. The patient is not nervous/anxious.        Objective:    BP 93/60 mmHg  Pulse 79  Temp(Src) 98.3 F (36.8 C) (Oral)  Ht 5' 11.5" (1.816 m)  Wt 165 lb 6 oz (75.014 kg)  BMI 22.75 kg/m2  SpO2 98% Physical Exam  Constitutional: She is oriented to person, place, and time. She appears well-developed and well-nourished. No distress.  HENT:  Head: Normocephalic and atraumatic.  Right Ear: External ear normal.  Left Ear: External ear normal.  Nose: Nose normal.  Mouth/Throat: Oropharynx is clear and moist. No oropharyngeal exudate.  Eyes: Conjunctivae are normal. Pupils are equal, round, and reactive to light. Right eye exhibits no discharge. Left eye exhibits no discharge. No scleral icterus.  Neck: Normal range of motion. Neck supple. No tracheal deviation present. No thyromegaly present.  Cardiovascular: Normal rate, regular rhythm, normal heart sounds and intact distal pulses.  Exam reveals no gallop and no friction rub.   No murmur heard. Pulmonary/Chest: Effort normal and breath sounds normal. No respiratory distress. She has no wheezes. She has no rales. She exhibits no tenderness.  Abdominal: She exhibits no distension.  Musculoskeletal: Normal range of motion. She exhibits no edema or tenderness.  Lymphadenopathy:    She has no cervical adenopathy.  Neurological:  She is alert and oriented to person, place, and time. No cranial nerve deficit. She exhibits normal muscle tone. Coordination normal.  Skin: Skin is warm and dry. No rash noted. She is not diaphoretic. No erythema. No pallor.  Psychiatric: She has a normal mood and affect. Her behavior is normal. Judgment and thought content normal.          Assessment & Plan:   Problem List Items Addressed This Visit      Unprioritized   Abdominal pain - Primary    Recent abdominal pain has resolved after stopping Actonel. Will monitor for any recurrence. Holding on GI referral for now.      Osteopenia    Unable to tolerate both Actonel and Fosamax. Discussed some options including Reclast and Prolia. She will consider these and follow up with Dr. Gale Journey to discuss. Continue Vit D.      Right lower lobe pneumonia    S/p treatment with Omnicef and Azithromycin. Exam normal today. Will repeat CXR.          Return in about 6 months (around 12/12/2015) for Recheck.

## 2015-06-13 NOTE — Assessment & Plan Note (Signed)
Recent abdominal pain has resolved after stopping Actonel. Will monitor for any recurrence. Holding on GI referral for now.

## 2015-06-13 NOTE — Assessment & Plan Note (Addendum)
S/p treatment with Omnicef and Azithromycin. Exam normal today. Will repeat CXR.

## 2015-06-13 NOTE — Progress Notes (Signed)
Pre visit review using our clinic review tool, if applicable. No additional management support is needed unless otherwise documented below in the visit note. 

## 2015-06-18 ENCOUNTER — Ambulatory Visit
Admission: RE | Admit: 2015-06-18 | Discharge: 2015-06-18 | Disposition: A | Discharge status: Home / Self Care | Payer: BLUE CROSS/BLUE SHIELD | Source: Ambulatory Visit | Attending: Internal Medicine | Admitting: Internal Medicine

## 2015-06-18 DIAGNOSIS — J189 Pneumonia, unspecified organism: Secondary | ICD-10-CM

## 2015-06-18 DIAGNOSIS — J181 Lobar pneumonia, unspecified organism: Principal | ICD-10-CM

## 2015-06-18 DIAGNOSIS — Z09 Encounter for follow-up examination after completed treatment for conditions other than malignant neoplasm: Secondary | ICD-10-CM | POA: Diagnosis present

## 2015-06-18 DIAGNOSIS — Z8701 Personal history of pneumonia (recurrent): Secondary | ICD-10-CM | POA: Insufficient documentation

## 2015-06-27 ENCOUNTER — Encounter: Payer: Self-pay | Admitting: Family Medicine

## 2015-06-27 ENCOUNTER — Ambulatory Visit: Payer: 59 | Admitting: Neurology

## 2015-06-27 ENCOUNTER — Ambulatory Visit (INDEPENDENT_AMBULATORY_CARE_PROVIDER_SITE_OTHER): Payer: BLUE CROSS/BLUE SHIELD | Admitting: Family Medicine

## 2015-06-27 VITALS — BP 124/82 | HR 66 | Temp 97.8°F | Ht 71.5 in | Wt 164.8 lb

## 2015-06-27 DIAGNOSIS — J988 Other specified respiratory disorders: Principal | ICD-10-CM

## 2015-06-27 DIAGNOSIS — B349 Viral infection, unspecified: Secondary | ICD-10-CM | POA: Diagnosis not present

## 2015-06-27 DIAGNOSIS — B9789 Other viral agents as the cause of diseases classified elsewhere: Secondary | ICD-10-CM | POA: Insufficient documentation

## 2015-06-27 NOTE — Progress Notes (Signed)
Pre visit review using our clinic review tool, if applicable. No additional management support is needed unless otherwise documented below in the visit note. 

## 2015-06-27 NOTE — Progress Notes (Signed)
Subjective:  Patient ID: Deborah Mcconnell, female    DOB: 1950-10-02  Age: 64 y.o. MRN: [id number]  CC: Cold, cough  HPI:  64 year old female with recent right lower lobe pneumonia presents with the above complaints. She is concerned about recurrence of her pneumonia.  Patient was seen on 11/8 and was diagnosed with right lower lobe pneumonia. She was placed on antibiotics (Doxy & Cefpodoxime). Patient continued to have symptoms and was seen in urgent care on 11/25. At that time she was given another course of antibiotics (Azithromycin and Omnicef).  Patient followed up with her primary care physician on 12/14. She had a repeat x-ray on 12/19 which was clear.  She presents today with complaints of URI symptoms as well as continued cough. Patient states that she's had these symptoms for the past 8 days. She states that she's had runny nose, itchy ears, and a mildly productive cough. Sputum is not discolored. No associated fevers or chills. Her ports of shortness of breath. Patient does reports that can fatigue and she is concerned that she has had a recurrence of her pneumonia. No exacerbating or relieving factors.  Social Hx   Social History   Social History  . Marital Status: Married    Spouse Name: N/A  . Number of Children: N/A  . Years of Education: N/A   Social History Main Topics  . Smoking status: Never Smoker   . Smokeless tobacco: Never Used  . Alcohol Use: 0.0 oz/week    0 Standard drinks or equivalent per week     Comment: rare  . Drug Use: No  . Sexual Activity: Yes   Other Topics Concern  . None   Social History Narrative   Review of Systems  Constitutional: Negative for fever.  HENT: Positive for congestion and rhinorrhea.   Respiratory: Positive for cough.     Objective:  BP 124/82 mmHg  Pulse 66  Temp(Src) 97.8 F (36.6 C) (Oral)  Ht 5' 11.5" (1.816 m)  Wt 164 lb 12 oz (74.73 kg)  BMI 22.66 kg/m2  SpO2 98%  BP/Weight 06/27/2015 06/13/2015  [id number]  Systolic BP [id number]  Diastolic BP 82 60 66  Wt. (Lbs) 164.75 165.38 164.25  BMI 22.66 22.75 22.59   Physical Exam  Constitutional: She appears well-developed. No distress.  HENT:  Head: Normocephalic and atraumatic.  Mouth/Throat: Oropharynx is clear and moist.  Eyes: Conjunctivae are normal.  Neck: Neck supple.  Cardiovascular: Normal rate and regular rhythm.   Pulmonary/Chest: Effort normal and breath sounds normal. No respiratory distress. She has no wheezes. She has no rales.  Lymphadenopathy:    She has no cervical adenopathy.  Neurological: She is alert.  Vitals reviewed.   Lab Results  Component Value Date   WBC 6.4 05/15/2015   HGB 14.1 05/15/2015   HCT 41.8 05/15/2015   PLT 341.0 05/15/2015   GLUCOSE 91 05/15/2015   CHOL 206* 05/15/2015   TRIG 109.0 05/15/2015   HDL 50.50 05/15/2015   LDLCALC 134* 05/15/2015   ALT 11 05/15/2015   AST 18 05/15/2015   NA 140 05/15/2015   K 3.8 05/15/2015   CL 103 05/15/2015   CREATININE 0.64 05/15/2015   BUN 12 05/15/2015   CO2 29 05/15/2015   TSH 1.25 05/15/2015   MICROALBUR 1.9 04/28/2014    Assessment & Plan:   Problem List Items Addressed This Visit    Viral respiratory infection - Primary    Exam unremarkable today. No signs of recurrent  pneumonia. Recent chest xray showed resolution. Advised symptomatic care with Flonase and nettipot.        Follow-up: PRN  [place]

## 2015-06-27 NOTE — Assessment & Plan Note (Signed)
Exam unremarkable today. No signs of recurrent pneumonia. Recent chest xray showed resolution. Advised symptomatic care with Flonase and nettipot.

## 2015-06-27 NOTE — Patient Instructions (Signed)
Continue conservative care.  Use flonase (this is over the counter) and nettipot for your congestion.  Call if you worsen. There is always someone on call.  Take care  Dr. Lacinda Axon

## 2015-08-02 ENCOUNTER — Encounter: Payer: Self-pay | Admitting: Family Medicine

## 2015-08-02 ENCOUNTER — Ambulatory Visit (INDEPENDENT_AMBULATORY_CARE_PROVIDER_SITE_OTHER): Payer: Medicare Other | Admitting: Family Medicine

## 2015-08-02 VITALS — BP 110/68 | HR 67 | Temp 97.8°F | Wt 167.6 lb

## 2015-08-02 DIAGNOSIS — R300 Dysuria: Secondary | ICD-10-CM | POA: Diagnosis not present

## 2015-08-02 DIAGNOSIS — N390 Urinary tract infection, site not specified: Secondary | ICD-10-CM | POA: Insufficient documentation

## 2015-08-02 DIAGNOSIS — N3001 Acute cystitis with hematuria: Secondary | ICD-10-CM

## 2015-08-02 LAB — POCT URINALYSIS DIPSTICK
BILIRUBIN UA: NEGATIVE
GLUCOSE UA: NEGATIVE
Ketones, UA: NEGATIVE
NITRITE UA: NEGATIVE
Protein, UA: NEGATIVE
Spec Grav, UA: <=1.005
Urobilinogen, UA: 0.2
pH, UA: 5.5

## 2015-08-02 LAB — URINALYSIS, MICROSCOPIC ONLY

## 2015-08-02 MED ORDER — CEPHALEXIN 500 MG PO CAPS: 500.0000 mg | ORAL_CAPSULE | Freq: Two times a day (BID) | ORAL | Status: DC

## 2015-08-02 NOTE — Progress Notes (Signed)
Patient ID: Deborah Mcconnell, female   DOB: [birth year], 65 y.o.   MRN: [id number]  Tommi Rumps, MD Phone: [phone]  Deborah Mcconnell is a 64 y.o. female who presents today for same-day visit.  UTI: Patient notes 2-3 days of dysuria, urinary frequency, urinary urgency. She notes a pressure discomfort with urination. No abdominal pain. No fevers. No vaginal discharge. Notes a tiny pink hue to her urine this morning. Has a history of UTIs in the past. She is sexually active. She tries to urinate after sex to help prevent UTIs. She notes she wipes front to back as well.  PMH: nonsmoker. Last positive urine culture grew Escherichia coli pansensitive.   ROS see history of present illness  Objective  Physical Exam Filed Vitals:   08/02/15 1002  BP: 110/68  Pulse: 67  Temp: 97.8 F (36.6 C)    Physical Exam  Constitutional: She is well-developed, well-nourished, and in no distress.  HENT:  Head: Normocephalic and atraumatic.  Cardiovascular: Normal rate, regular rhythm and normal heart sounds.  Exam reveals no gallop and no friction rub.   No murmur heard. Pulmonary/Chest: Effort normal and breath sounds normal. No respiratory distress. She has no wheezes. She has no rales.  Abdominal: Soft. Bowel sounds are normal. She exhibits no distension. There is no tenderness. There is no rebound and no guarding.  Neurological: She is alert. Gait normal.  Skin: Skin is dry. She is not diaphoretic.     Assessment/Plan: Please see individual problem list.  UTI (urinary tract infection) Symptoms and UA consistent with UTI. Benign abdominal exam today. We'll treat with Keflex. Send urine for culture and microscopy. Given return precautions.    Orders Placed This Encounter  Procedures  . Urine Culture  . Urine Microscopic Only  . POCT Urinalysis Dipstick    Meds ordered this encounter  Medications  . cephALEXin (KEFLEX) 500 MG capsule    Sig: Take 1 capsule (500 mg total)  by mouth 2 (two) times daily.    Dispense:  14 capsule    Refill:  0    Tommi Rumps

## 2015-08-02 NOTE — Patient Instructions (Signed)
Nice to see you. You have a UTI. We'll treat this with Keflex. If you develop fevers, abdominal pain, chills, blood in your urine, or any new or change in symptoms please seek medical attention.

## 2015-08-02 NOTE — Assessment & Plan Note (Signed)
Symptoms and UA consistent with UTI. Benign abdominal exam today. We'll treat with Keflex. Send urine for culture and microscopy. Given return precautions.

## 2015-08-03 LAB — URINE CULTURE: Colony Count: 3000

## 2015-08-04 ENCOUNTER — Telehealth: Payer: Self-pay | Admitting: Family Medicine

## 2015-08-04 NOTE — Telephone Encounter (Signed)
Attempted to call patient regarding urine culture results. There was no answer. I left a message asking her ot call back to the office at her convenience. I sent her a mychart message with the results as well.

## 2015-08-07 ENCOUNTER — Encounter: Payer: Self-pay | Admitting: Surgical

## 2015-08-14 ENCOUNTER — Ambulatory Visit: Payer: 59 | Admitting: Internal Medicine

## 2015-08-15 ENCOUNTER — Encounter: Payer: Self-pay | Admitting: Internal Medicine

## 2015-08-15 ENCOUNTER — Ambulatory Visit
Admission: RE | Admit: 2015-08-15 | Discharge: 2015-08-15 | Disposition: A | Discharge status: Home / Self Care | Payer: Medicare Other | Source: Ambulatory Visit | Attending: Internal Medicine | Admitting: Internal Medicine

## 2015-08-15 ENCOUNTER — Ambulatory Visit (INDEPENDENT_AMBULATORY_CARE_PROVIDER_SITE_OTHER): Payer: Medicare Other | Admitting: Internal Medicine

## 2015-08-15 VITALS — BP 109/72 | HR 68 | Temp 98.3°F | Ht 71.5 in | Wt 167.0 lb

## 2015-08-15 DIAGNOSIS — M546 Pain in thoracic spine: Secondary | ICD-10-CM

## 2015-08-15 LAB — POCT URINALYSIS DIPSTICK
BILIRUBIN UA: NEGATIVE
GLUCOSE UA: NEGATIVE
KETONES UA: NEGATIVE
Nitrite, UA: NEGATIVE
Protein, UA: NEGATIVE
SPEC GRAV UA: 1.015
UROBILINOGEN UA: 0.2
pH, UA: 7

## 2015-08-15 MED ORDER — CYCLOBENZAPRINE HCL 5 MG PO TABS: 5.0000 mg | ORAL_TABLET | Freq: Three times a day (TID) | ORAL | Status: DC | PRN

## 2015-08-15 NOTE — Assessment & Plan Note (Signed)
Focal back pain that radiates to upper abdomen. Suspect nerve compression and paraspinal muscle spasm. Xray of thoracic spine normal today. Will start Flexeril. Discussed adding Prednisone, however she prefers to hold off. Will set up PT and start TENS unit. Follow up 2 weeks and prn.

## 2015-08-15 NOTE — Patient Instructions (Signed)
Start Flexeril 5mg  as needed for back pain. This medication may make you drowsy.  Considering purchasing a TENS unit at your pharmacy to help with back pain.  We will set up physical therapy evaluation.  Xray of your thoracic spine today.

## 2015-08-15 NOTE — Progress Notes (Signed)
Subjective:    Patient ID: Deborah Mcconnell, female    DOB: 09/14/1950, 65 y.o.   MRN: [id number]  HPI  65YO female presents for acute visit.  Back pain - Back pain started about 1 week ago. First started in left upper abdomen/lower chest wall, sore to touch. Then radiated to back. Now radiating around from back to abdomen. Continues to be sore to touch. Worse with deep breath. No new activities. Not taking anything except for Aleve with no improvement. No cough or dyspnea. No fever. Pain best in morning, worse in afternoon.  Wt Readings from Last 3 Encounters:  08/15/15 167 lb (75.751 kg)  08/02/15 167 lb 9.6 oz (76.023 kg)  06/27/15 164 lb 12 oz (74.73 kg)   BP Readings from Last 3 Encounters:  08/15/15 109/72  08/02/15 110/68  06/27/15 124/82    Past Medical History  Diagnosis Date  . Vertigo   . Hyperlipidemia   . Osteopenia     previously taking Evista and Actonel  . Osteoporosis   . Cancer [facility])    Family History  Problem Relation Age of Onset  . Osteoporosis Mother   . Osteoporosis Sister   . Hypertension Mother   . Osteoarthritis Mother   . Osteoarthritis Sister   . Osteoarthritis Maternal Grandmother   . Diabetes Maternal Grandfather   . Cancer Paternal Grandfather     unknown   Past Surgical History  Procedure Laterality Date  . Cholecystectomy    . Lt breast lumpectomy     Social History   Social History  . Marital Status: Married    Spouse Name: N/A  . Number of Children: N/A  . Years of Education: N/A   Social History Main Topics  . Smoking status: Never Smoker   . Smokeless tobacco: Never Used  . Alcohol Use: 0.0 oz/week    0 Standard drinks or equivalent per week     Comment: rare  . Drug Use: No  . Sexual Activity: Yes   Other Topics Concern  . None   Social History Narrative    Review of Systems  Constitutional: Negative for fever, chills, appetite change, fatigue and unexpected weight change.  Eyes: Negative for visual  disturbance.  Respiratory: Negative for cough, chest tightness, shortness of breath and wheezing.   Cardiovascular: Negative for chest pain, palpitations and leg swelling.  Gastrointestinal: Negative for abdominal pain, diarrhea and constipation.  Musculoskeletal: Positive for myalgias, back pain and arthralgias.  Skin: Negative for color change and rash.  Hematological: Negative for adenopathy. Does not bruise/bleed easily.  Psychiatric/Behavioral: Negative for dysphoric mood. The patient is not nervous/anxious.        Objective:    BP 109/72 mmHg  Pulse 68  Temp(Src) 98.3 F (36.8 C) (Oral)  Ht 5' 11.5" (1.816 m)  Wt 167 lb (75.751 kg)  BMI 22.97 kg/m2  SpO2 100% Physical Exam  Constitutional: She is oriented to person, place, and time. She appears well-developed and well-nourished. No distress.  HENT:  Head: Normocephalic and atraumatic.  Right Ear: External ear normal.  Left Ear: External ear normal.  Nose: Nose normal.  Mouth/Throat: Oropharynx is clear and moist. No oropharyngeal exudate.  Eyes: Conjunctivae are normal. Pupils are equal, round, and reactive to light. Right eye exhibits no discharge. Left eye exhibits no discharge. No scleral icterus.  Neck: Normal range of motion. Neck supple. No tracheal deviation present. No thyromegaly present.  Cardiovascular: Normal rate, regular rhythm, normal heart sounds and intact distal pulses.  Exam reveals no gallop and no friction rub.   No murmur heard. Pulmonary/Chest: Effort normal and breath sounds normal. No respiratory distress. She has no wheezes. She has no rales. She exhibits no tenderness.  Musculoskeletal: Normal range of motion. She exhibits no edema.       Thoracic back: She exhibits tenderness and pain. She exhibits normal range of motion, no swelling and no deformity.       Arms: Lymphadenopathy:    She has no cervical adenopathy.  Neurological: She is alert and oriented to person, place, and time. No cranial  nerve deficit. She exhibits normal muscle tone. Coordination normal.  Skin: Skin is warm and dry. No rash noted. She is not diaphoretic. No erythema. No pallor.  Psychiatric: She has a normal mood and affect. Her behavior is normal. Judgment and thought content normal.          Assessment & Plan:   Problem List Items Addressed This Visit      Unprioritized   Midline thoracic back pain - Primary    Focal back pain that radiates to upper abdomen. Suspect nerve compression and paraspinal muscle spasm. Xray of thoracic spine normal today. Will start Flexeril. Discussed adding Prednisone, however she prefers to hold off. Will set up PT and start TENS unit. Follow up 2 weeks and prn.      Relevant Medications   cyclobenzaprine (FLEXERIL) 5 MG tablet   Other Relevant Orders   POCT Urinalysis Dipstick (Completed)   DG Thoracic Spine W/Swimmers (Completed)   Ambulatory referral to Physical Therapy       Return in about 1 week (around 08/22/2015) for Recheck.

## 2015-08-15 NOTE — Progress Notes (Signed)
Pre visit review using our clinic review tool, if applicable. No additional management support is needed unless otherwise documented below in the visit note. 

## 2015-08-19 DIAGNOSIS — R509 Fever, unspecified: Secondary | ICD-10-CM | POA: Diagnosis not present

## 2015-08-19 DIAGNOSIS — J101 Influenza due to other identified influenza virus with other respiratory manifestations: Secondary | ICD-10-CM | POA: Diagnosis not present

## 2015-08-20 ENCOUNTER — Ambulatory Visit: Payer: Medicare Other | Admitting: Physical Therapy

## 2015-08-21 ENCOUNTER — Encounter: Payer: Self-pay | Admitting: Internal Medicine

## 2015-08-22 ENCOUNTER — Encounter: Payer: 59 | Admitting: Physical Therapy

## 2015-08-24 ENCOUNTER — Ambulatory Visit: Payer: 59 | Admitting: Internal Medicine

## 2015-08-28 ENCOUNTER — Encounter: Payer: Self-pay | Admitting: Physical Therapy

## 2015-08-28 ENCOUNTER — Ambulatory Visit: Payer: Medicare Other | Attending: Internal Medicine | Admitting: Physical Therapy

## 2015-08-28 DIAGNOSIS — R0781 Pleurodynia: Secondary | ICD-10-CM | POA: Diagnosis not present

## 2015-08-28 DIAGNOSIS — M546 Pain in thoracic spine: Secondary | ICD-10-CM | POA: Insufficient documentation

## 2015-08-28 NOTE — Therapy (Signed)
[facility] [address] [place], [address] Phone: [phone]   Fax:  [phone]  Physical Therapy Evaluation  Patient Details  Name: Deborah Mcconnell MRN: [id number] Date of Birth: [birth year] Referring Provider: Jackolyn Confer, MD  Encounter Date: 08/28/2015      PT End of Session - 08/28/15 1501    Visit Number 1   Number of Visits 12   Date for PT Re-Evaluation 10/09/15   Authorization Type 1   Authorization Time Period 10 (G Code)   PT Start Time 0750   PT Stop Time 0900   PT Time Calculation (min) 70 min   Activity Tolerance Patient tolerated treatment well;No increased pain   Behavior During Therapy [facility] for tasks assessed/performed      Past Medical History  Diagnosis Date  . Vertigo   . Hyperlipidemia   . Osteopenia     previously taking Evista and Actonel  . Osteoporosis   . Cancer [facility])     Past Surgical History  Procedure Laterality Date  . Cholecystectomy    . Lt breast lumpectomy      There were no vitals filed for this visit.  Visit Diagnosis:  Left-sided thoracic back pain - Plan: PT plan of care cert/re-cert  Rib pain on left side - Plan: PT plan of care cert/re-cert      Subjective Assessment - 08/28/15 0908    Subjective Pt reports mid thoracic pain centerally at levels T6-10 that radiates across the left side into the anterior portion of the ribs. Patient reports current pain is 5/10, worst pain in the past 3 days was 6/10 and best pain was 2/10. Aggravation of symptoms with lifting, bending, pulling, coughing, laughing and pressing. States sitting aggrvates pain after 45 min. Pt states pain in improved in the morning versus the evening. Patient reports pain is getting better since the onset.   Pertinent History Acute on chronic insidious onset of throacic back pain and radiating pain in the ribs on ~07/21/15. Pain improved with bed rest over the past week (when  sick). Hx of breast cancer and lumpectomy 2014/2015.    Limitations Sitting;Reading;Lifting   How long can you sit comfortably? 45 min   Diagnostic tests X-Ray: No significant finding   Patient Stated Goals Lifting grandson (~30#) into car seat, Sitting for longer period of time without pain, being able to carry objects without discomfort.   Currently in Pain? Yes   Pain Score 5    Pain Location Back   Pain Orientation Left;Mid   Pain Descriptors / Indicators Aching;Stabbing;Constant;Tender;Guarding   Pain Type Acute pain   Pain Onset More than a month ago   Pain Frequency Constant   Aggravating Factors  Sitting, lifting, reaching   Effect of Pain on Daily Activities Unable to lift grandson without mid thoracic symptoms, inability to sit for 45 minutes without onset of pain inhibiting occupational duties.             [facility] PT Assessment - 08/28/15 [id number]    Assessment   Medical Diagnosis Midline thoracic back pain M54.6   Referring Provider Jackolyn Confer, MD   Onset Date/Surgical Date 07/21/15   Hand Dominance Right   Next MD Visit 09/10/15   Prior Therapy Previous shoulder therapy post left   Balance Screen   Has the patient fallen in the past 6 months No   Has the patient had a decrease in activity level because of a  fear of falling?  No   Is the patient reluctant to leave their home because of a fear of falling?  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  Requires pushing, pulling, bending, lifting, walking   Leisure Gardening, caretaking for 3.5 grandson, sewing, walking       Objective: Observation: Posture: Slight forward head posture with forward rounded shoulders.  Palpation: Tender to palpation and muscle guard on left: lower trap, latissimus dorsi, scapular retractors, upper trap, serratus anterior Measurements Thoracic AROM: Flexion WNL, extension, and left rotation limited by 33% with pain at ER. Shoulder AROM/MMT: Flexion: 160 left, 180 right, IR: strong painless, ER: strong painless Lat MMT: 5/5 painful.  Special tests: Repeated thoracic flexion & left rotation: 3 x 10 improved symptoms after each set and able to more through more available range -- more alleviation of pain with rotation versus flexion  Therapeutic activity: Scapular retraction: 2 x 10 -- Improved availability of range with progression of repetitions Thoracic rotation: 2 x 10 -- Improved availability of range with progression of repetitions Lat stretch: 10sec x 3   Manual Therapy: STM performed to serratus anterior, lats, lower trap, scapular retractors and thoracic extensors utilizing superficial and deep techniques to perform.  Patient response to treatment: Slight decrease in treatment after performing repeated thoracic flexion and left rotation. Decrease in muscle spasms by 25% after performing soft techniques. Decreased shoulder flexion on the left due to increased muscle guarding with musculature on that side.           PT Education - 08/28/15 1457    Education provided Yes   Education Details HEP: scapular retraction, thoracic rotation to the left   Person(s) Educated Patient   Methods Explanation;Demonstration   Comprehension Verbalized understanding;Returned demonstration;Verbal cues required             PT Long Term Goals - 08/28/15 1537    PT LONG TERM GOAL #1   Title Pt will improve the FABQ-PA to 10/30 by 10/09/15 to show significant improvement in self-preceived fear avoidance to allow for the ability to perform physical activties without increased  fear.    Baseline 18/30 FABQ-PA (08/28/15)   Status New   PT LONG TERM GOAL #2   Title Pt will improve the Modified oswestry to <20% by 10/09/15 to show significant improvement in self-preceived low back pain and improved ability to perform bending and lifting activities.   Baseline 32% (08/28/15)   Status New   PT LONG TERM GOAL #3   Title Pt will be able to lift a 35# weight to waist height without pain by 10/09/15 to show ability to lift grandson into his car seat.   Baseline Unable to lift without onset of intense pain.   Status New   PT LONG TERM GOAL #4   Title Pt will be independent with HEP focusing on motor control, AROM, muscular endurance, and strength by 10/09/15 in order to return to prior level of function.    Baseline Dependent with HEP requiring frequent tactile and verbal cueing to perform.   Status New  Plan - 09/17/2015 1505    Clinical Impression Statement Patient is a 65 yo right handed female experiencing mid/lower thoracic back pain that radiates to the anterior portion of the rib. Increased muscular spasms noted throughout the mid thoracic area reproducing thoracic pain symptoms indicating muscular involvement. Pt with reproduced symptoms when performing thoracic rotation to the left  and extension indicating possible facet dysfunction. Modified oswestry = 32% indicating moderate self perceived disability. Score of 18/30 on the physical activity portion on the FABQ-PA indicates increased fear of physical activity. Patient has limited knowledge of appropriate pain control strategies and progression of exercises in order to return to prior level of function.    Pt will benefit from skilled therapeutic intervention in order to improve on the following deficits Decreased mobility;Decreased strength;Hypomobility;Impaired flexibility;Pain;Impaired UE functional use;Increased muscle spasms;Decreased endurance;Decreased range of motion;Decreased coordination;Increased  fascial restricitons   Rehab Potential Good   Clinical Impairments Affecting Rehab Potential (+) acute injury (-) previous cancer   PT Frequency 2x / week   PT Duration 6 weeks   PT Treatment/Interventions ADLs/Self Care Home Management;Electrical Stimulation;Iontophoresis 4mg /ml Dexamethasone;Cryotherapy;Moist Heat;Therapeutic exercise;Therapeutic activities;Ultrasound;Neuromuscular re-education;Patient/family education;Manual techniques;Passive range of motion   PT Next Visit Plan serratus punches, bear hugs   PT Home Exercise Plan scapular retraction, left thoracic rotation   Consulted and Agree with Plan of Care Patient          G-Codes - 09/17/15 0930    Functional Assessment Tool Used modified oswestry, ROM, strength, pain scale, clinical judgment   Functional Limitation Carrying, moving and handling objects   Carrying, Moving and Handling Objects Current Status [id number]) At least 20 percent but less than 40 percent impaired, limited or restricted   Carrying, Moving and Handling Objects Goal Status [id number]) At least 1 percent but less than 20 percent impaired, limited or restricted       Problem List Patient Active Problem List   Diagnosis Date Noted  . Midline thoracic back pain 08/15/2015  . Microscopic hematuria 05/08/2015  . Vertigo 10/27/2014  . Malignant neoplasm of breast (female) ([place]) 11/19/2012  . Breast CA ([place]) 11/19/2012  . Routine general medical examination at a health care facility 10/27/2012  . Screening for colon cancer 10/27/2012  . Hyperlipidemia 02/17/2011  . Osteopenia 02/17/2011    Blythe Stanford, SPT 2015-09-17, 8:12 PM  [facility] [address], [place], [address] Phone: [phone]   Fax:  [phone]  Name: Anjela Whitsel MRN: [id number] Date of Birth: 02-17-1951

## 2015-08-29 ENCOUNTER — Encounter: Payer: Self-pay | Admitting: Physical Therapy

## 2015-08-29 ENCOUNTER — Ambulatory Visit: Payer: Medicare Other | Attending: Internal Medicine | Admitting: Physical Therapy

## 2015-08-29 DIAGNOSIS — R0781 Pleurodynia: Secondary | ICD-10-CM | POA: Insufficient documentation

## 2015-08-29 DIAGNOSIS — M546 Pain in thoracic spine: Secondary | ICD-10-CM | POA: Insufficient documentation

## 2015-08-29 NOTE — Therapy (Signed)
[facility] [address], [place], [address] Phone: [phone]   Fax:  [phone]  Physical Therapy Treatment  Patient Details  Name: Deborah Mcconnell MRN: [id number] Date of Birth: 10/18/1950 Referring Provider: Jackolyn Confer, MD  Encounter Date: 08/29/2015      PT End of Session - 08/29/15 1857    Visit Number 2   Number of Visits 12   Date for PT Re-Evaluation 10/09/15   Authorization Type 2   Authorization Time Period 10 (G Code)   PT Start Time [id number]   PT Stop Time 1615   PT Time Calculation (min) 44 min   Activity Tolerance Patient tolerated treatment well;No increased pain   Behavior During Therapy [facility] for tasks assessed/performed      Past Medical History  Diagnosis Date  . Vertigo   . Hyperlipidemia   . Osteopenia     previously taking Evista and Actonel  . Osteoporosis   . Cancer [facility])     Past Surgical History  Procedure Laterality Date  . Cholecystectomy    . Lt breast lumpectomy      There were no vitals filed for this visit.  Visit Diagnosis:  Left-sided thoracic back pain  Rib pain on left side      Subjective Assessment - 08/29/15 1535    Subjective Pt reports slight increase in thoracic pain today with a decrease in anterior rib pain.    Pertinent History Acute on chronic insidious onset of throacic back pain and radiating pain in the ribs on ~07/21/15. Pain improved with bed rest over the past week (when sick). Hx of breast cancer and lumpectomy 2014/2015.    Limitations Sitting;Reading;Lifting   Patient Stated Goals Lifting grandson (~30#) into car seat, Sitting for longer period of time without pain, being able to carry objects without discomfort.   Currently in Pain? Yes   Pain Score 6    Pain Location Back   Pain Orientation Left;Mid   Pain Descriptors / Indicators Aching;Stabbing;Constant;Tender;Guarding   Pain Type Acute pain        Objective: Observation: Unable to lay prone without increase in symptoms, AROM: 25% limited in right side bending with pain at end range (Increased to full by end of session) Palpation:  left thoracic paraspinals along levels T7-8 with central tenderness located over spinous processes  Therapeutic Exercise: Patient performed exercises with guidance, verbal and tactile cues and demonstration of therapist: Serratus punches in supine-- x15 Prone on elbows -- 3 min Bear hugs with thoracic rotation bilaterally -- 2 x10 Straight arm pull downs at [employer] -- x15 #10 Scapular retractions at [facility] -- 15 #10 Scapular retractions while in massage chair: 2 x 10   Manual Therapy:  STM T7-9 using deep and superficial techniques on left side along paraspinals with spasms and muscle guarding. General thoracic myofascial release performed on mid thoracic spine to decrease spasms. Patient positioned in massage chair for manual therapy.  Response to Treatment:  Spasms decreased 25% over thoracic paraspinals after performing deep/superficial techniques. Decreased thoracic pain after performing therapeutic exercise demonstrating significant decrease after performing scapular retractions.            PT Education - 08/29/15 1857    Education provided Yes   Education Details HEP: Bear hugs, scapular retraction with band, straight arm shoulder ext   Person(s) Educated Patient   Methods Explanation;Demonstration   Comprehension Verbalized understanding;Returned demonstration  PT Long Term Goals - 08/28/15 1537    PT LONG TERM GOAL #1   Title Pt will improve the FABQ-PA to 10/30 by 10/09/15 to show significant improvement in self-preceived fear avoidance to allow for the ability to perform physical activties without increased fear.    Baseline 18/30 FABQ-PA (08/28/15)   Status New   PT LONG TERM GOAL #2   Title Pt will improve the Modified oswestry to <20% by 10/09/15 to show  significant improvement in self-preceived low back pain and improved ability to perform bending and lifting activities.   Baseline 32% (08/28/15)   Status New   PT LONG TERM GOAL #3   Title Pt will be able to lift a 35# weight to waist height without pain by 10/09/15 to show ability to lift grandson into his car seat.   Baseline Unable to lift without onset of intense pain.   Status New   PT LONG TERM GOAL #4   Title Pt will be independent with HEP focusing on motor control, AROM, muscular endurance, and strength by 10/09/15 in order to return to prior level of function.    Baseline Dependent with HEP requiring frequent tactile and verbal cueing to perform.   Status New               Plan - 08/29/15 1858    Clinical Impression Statement Patient responded well to treatment session with improved ability to move through more throacic range of motion post therapy. Pt demonstrates limitations in motor control and muscular endurance in scapular stabilizers and will benefit from further skilled therapy aimed at improving these limitations to return to prior level of function.    Pt will benefit from skilled therapeutic intervention in order to improve on the following deficits Decreased mobility;Decreased strength;Hypomobility;Impaired flexibility;Pain;Impaired UE functional use;Increased muscle spasms;Decreased endurance;Decreased range of motion;Decreased coordination;Increased fascial restricitons   Rehab Potential Good   Clinical Impairments Affecting Rehab Potential (+) acute injury (-) previous cancer   PT Frequency 2x / week   PT Duration 6 weeks   PT Treatment/Interventions ADLs/Self Care Home Management;Electrical Stimulation;Iontophoresis 4mg /ml Dexamethasone;Cryotherapy;Moist Heat;Therapeutic exercise;Therapeutic activities;Neuromuscular re-education;Patient/family education;Manual techniques;Passive range of motion   PT Next Visit Plan Reassess mobility, ice massage (if inflammation  persists)   PT Home Exercise Plan scapular retraction, left thoracic rotation, straight arm push downs, serratus punches        Problem List Patient Active Problem List   Diagnosis Date Noted  . Midline thoracic back pain 08/15/2015  . Microscopic hematuria 05/08/2015  . Vertigo 10/27/2014  . Malignant neoplasm of breast (female) ([place]) 11/19/2012  . Breast CA ([place]) 11/19/2012  . Routine general medical examination at a health care facility 10/27/2012  . Screening for colon cancer 10/27/2012  . Hyperlipidemia 02/17/2011  . Osteopenia 02/17/2011    Blythe Stanford, SPT 08/29/2015, 7:03 PM  [facility] [address], [place], [address] Phone: [phone]   Fax:  [phone]  Name: Laine Ennen MRN: [id number] Date of Birth: 09-13-1950

## 2015-09-03 ENCOUNTER — Encounter: Payer: Self-pay | Admitting: Physical Therapy

## 2015-09-03 ENCOUNTER — Ambulatory Visit: Payer: Medicare Other | Admitting: Physical Therapy

## 2015-09-03 DIAGNOSIS — R0781 Pleurodynia: Secondary | ICD-10-CM

## 2015-09-03 DIAGNOSIS — M546 Pain in thoracic spine: Secondary | ICD-10-CM

## 2015-09-03 NOTE — Therapy (Signed)
[facility] [address] [place], [address] Phone: [phone]   Fax:  [phone]  Physical Therapy Treatment  Patient Details  Name: Deborah Mcconnell MRN: [id number] Date of Birth: Oct 29, 1950 Referring Provider: Jackolyn Confer, MD  Encounter Date: 09/03/2015      PT End of Session - 09/03/15 1649    Visit Number 3   Number of Visits 12   Date for PT Re-Evaluation 10/09/15   Authorization Type 3   Authorization Time Period 10 (G Code)   PT Start Time 1532   PT Stop Time 1616   PT Time Calculation (min) 44 min   Activity Tolerance Patient tolerated treatment well;No increased pain   Behavior During Therapy [facility] for tasks assessed/performed      Past Medical History  Diagnosis Date  . Vertigo   . Hyperlipidemia   . Osteopenia     previously taking Evista and Actonel  . Osteoporosis   . Cancer [facility])     Past Surgical History  Procedure Laterality Date  . Cholecystectomy    . Lt breast lumpectomy      There were no vitals filed for this visit.  Visit Diagnosis:  Rib pain on left side  Left-sided thoracic back pain      Subjective Assessment - 09/03/15 1535    Subjective Pt reports decreased thoracic back pain today. Mentions she only feels the increased pain with sneezing and coughing.   Pertinent History Acute on chronic insidious onset of throacic back pain and radiating pain in the ribs on ~07/21/15. Pain improved with bed rest over the past week (when sick). Hx of breast cancer and lumpectomy 2014/2015.    Limitations Sitting;Reading;Lifting   Patient Stated Goals Lifting grandson (~30#) into car seat, Sitting for longer period of time without pain, being able to carry objects without discomfort.   Currently in Pain? Yes   Pain Score 3    Pain Location Back   Pain Orientation Left;Mid   Pain Descriptors / Indicators Aching;Constant;Stabbing;Tender;Guarding   Pain Type Acute pain    Pain Onset More than a month ago   Pain Frequency Constant      Objective: Observation: Unable to lay prone without increase in symptoms, AROM: WNL with pain at end range (No pain by end of session) Palpation: left thoracic paraspinals along iliocostalis levels T4-10 with central tenderness located over spinous processes T5-7.  Therapeutic Exercise: Patient performed exercises with guidance, verbal and tactile cues and demonstration of therapist: Serratus punches in supine-- 2x15 Thoracic rotation bilaterally in sitting -- x 15 Straight arm pull downs at [place] -- x15 #10; x15 #15 Scapular retractions at [facility] --  2 x 15 #15 Cable rotations at [facility] -- x15 #15; x15 #10   Manual Therapy:  STM T5-9 using deep and superficial techniques on left side along iliocostalis with spasms and muscle guarding. Myofascial release techniques performed on mid thoracic spine along erector spinae to decrease spasms. Patient positioned in massage chair for manual therapy.  Response to Treatment: Spasms decreased 50% over thoracic paraspinals after performing deep/superficial techniques and patient demonstrated ability to perform all thoracic motions without end range pain. Resolution of thoracic pain after performing therapeutic exercises today.          PT Education - 09/03/15 1650    Education provided Yes   Education Details HEP: Bear hugs, scapular retraction/ straight arm shoulder ext, serratus punches   Person(s) Educated Patient   Methods  Explanation;Demonstration   Comprehension Verbalized understanding;Returned demonstration             PT Long Term Goals - 08/28/15 1537    PT LONG TERM GOAL #1   Title Pt will improve the FABQ-PA to 10/30 by 10/09/15 to show significant improvement in self-preceived fear avoidance to allow for the ability to perform physical activties without increased fear.    Baseline 18/30 FABQ-PA (08/28/15)   Status New   PT LONG TERM GOAL #2   Title Pt  will improve the Modified oswestry to <20% by 10/09/15 to show significant improvement in self-preceived low back pain and improved ability to perform bending and lifting activities.   Baseline 32% (08/28/15)   Status New   PT LONG TERM GOAL #3   Title Pt will be able to lift a 35# weight to waist height without pain by 10/09/15 to show ability to lift grandson into his car seat.   Baseline Unable to lift without onset of intense pain.   Status New   PT LONG TERM GOAL #4   Title Pt will be independent with HEP focusing on motor control, AROM, muscular endurance, and strength by 10/09/15 in order to return to prior level of function.    Baseline Dependent with HEP requiring frequent tactile and verbal cueing to perform.   Status New               Plan - 09/03/15 1628    Clinical Impression Statement Patient is making progress towards long term goals with ability to perform therapeutic exercises demonstrating appropiate motor control/technique. Continues to report decreased pain after manual therapy techniques  demonstrating decreased muscle spasms and guarding and will benefit from further skilled therapy to return to prior level of function.    Pt will benefit from skilled therapeutic intervention in order to improve on the following deficits Decreased mobility;Decreased strength;Hypomobility;Impaired flexibility;Pain;Impaired UE functional use;Increased muscle spasms;Decreased endurance;Decreased range of motion;Decreased coordination;Increased fascial restricitons   Rehab Potential Good   Clinical Impairments Affecting Rehab Potential (+) acute injury (-) previous cancer   PT Frequency 2x / week   PT Duration 6 weeks   PT Treatment/Interventions ADLs/Self Care Home Management;Electrical Stimulation;Iontophoresis 4mg /ml Dexamethasone;Cryotherapy;Moist Heat;Therapeutic exercise;Therapeutic activities;Neuromuscular re-education;Patient/family education;Manual techniques;Passive range of motion    PT Next Visit Plan Reassess mobility, ice massage (if inflammation persists)   PT Home Exercise Plan scapular retraction, left thoracic rotation, straight arm push downs, serratus punches        Problem List Patient Active Problem List   Diagnosis Date Noted  . Midline thoracic back pain 08/15/2015  . Microscopic hematuria 05/08/2015  . Vertigo 10/27/2014  . Malignant neoplasm of breast (female) ([place]) 11/19/2012  . Breast CA ([place]) 11/19/2012  . Routine general medical examination at a health care facility 10/27/2012  . Screening for colon cancer 10/27/2012  . Hyperlipidemia 02/17/2011  . Osteopenia 02/17/2011    Blythe Stanford, SPT 09/03/2015, 5:18 PM  [facility] [address] [place], [address] Phone: [phone]   Fax:  [phone]  Name: Deborah Mcconnell MRN: [id number] Date of Birth: Feb 14, 1951

## 2015-09-05 ENCOUNTER — Encounter: Payer: Self-pay | Admitting: Physical Therapy

## 2015-09-05 ENCOUNTER — Ambulatory Visit: Payer: Medicare Other | Admitting: Physical Therapy

## 2015-09-05 DIAGNOSIS — R0781 Pleurodynia: Secondary | ICD-10-CM

## 2015-09-05 DIAGNOSIS — M546 Pain in thoracic spine: Secondary | ICD-10-CM

## 2015-09-05 NOTE — Therapy (Signed)
[facility] [address], [place], [address] Phone: [phone]   Fax:  [phone]  Physical Therapy Treatment  Patient Details  Name: Deborah Mcconnell MRN: [id number] Date of Birth: [birth year] Referring Provider: Jackolyn Confer, MD  Encounter Date: 09/05/2015      PT End of Session - 09/05/15 1645    Visit Number 4   Number of Visits 12   Date for PT Re-Evaluation 10/09/15   Authorization Type 4   Authorization Time Period 10 (G Code)   PT Start Time [id number]   PT Stop Time [id number]   PT Time Calculation (min) 42 min   Activity Tolerance Patient tolerated treatment well;No increased pain   Behavior During Therapy [facility] for tasks assessed/performed      Past Medical History  Diagnosis Date  . Vertigo   . Hyperlipidemia   . Osteopenia     previously taking Evista and Actonel  . Osteoporosis   . Cancer [facility])     Past Surgical History  Procedure Laterality Date  . Cholecystectomy    . Lt breast lumpectomy      There were no vitals filed for this visit.  Visit Diagnosis:  Rib pain on left side  Left-sided thoracic back pain      Subjective Assessment - 09/05/15 1639    Subjective Pt reports increased aching mid back pain on 09/04/15 with minor anterior rib symptoms. States pain is much improved today and overall much improved since beginning physical therapy.   Pertinent History Acute on chronic insidious onset of throacic back pain and radiating pain in the ribs on ~07/21/15. Pain improved with bed rest over the past week (when sick). Hx of breast cancer and lumpectomy 2014/2015.    Limitations Sitting;Reading;Lifting   Patient Stated Goals Lifting grandson (~30#) into car seat, Sitting for longer period of time without pain, being able to carry objects without discomfort.   Currently in Pain? Yes   Pain Score 3    Pain Location Back   Pain Orientation Left;Mid   Pain Descriptors / Indicators  Aching;Constant;Tender   Pain Type Acute pain   Pain Onset More than a month ago       Objective: Observation:AROM: WNL with pain at end range towards right side being and left rotation Palpation: left thoracic paraspinals along iliocostalis levels T4-10 with central tenderness located over spinous processes T5-6.  Therapeutic Exercise: Patient performed exercises with guidance, verbal and tactile cues and demonstration of therapist:  Thoracic rotation sidebending bilaterally in sitting -- x 15 Straight arm pull downs at [place] -- x15 #10; x15 #15 Scapular retractions at [facility] -- x 15 #15, unilaterally x15 #10  Push up plus -- x15  Wall angel (facing wall) -- x15 with straight arms back against wall and bent arms facing wall Diaphragmatic Breathing in sitting -- x10     Manual Therapy:  STM T4-10 using deep and superficial techniques on left side along iliocostalis with restrictions and muscle guarding. Myofascial release techniques performed along erector spinae to decrease spasms. Patient positioned in massage chair for manual therapy.  Response to Treatment: Spasms decreased 50% along thoracic paraspinals after performing deep techniques. Patient unable to perform wall angels with proper technique with back against the wall secondary to decreased shoulder external rotation. Good demonstration of form requiring minimal tactile cueing when performing push up plus.           PT Education - 09/05/15 1642  Education provided Yes   Education Details HEP: Push up PLUS, Wall angels    Person(s) Educated Patient   Methods Explanation;Demonstration   Comprehension Verbalized understanding;Returned demonstration             PT Long Term Goals - 08/28/15 1537    PT LONG TERM GOAL #1   Title Pt will improve the FABQ-PA to 10/30 by 10/09/15 to show significant improvement in self-preceived fear avoidance to allow for the ability to perform physical activties without  increased fear.    Baseline 18/30 FABQ-PA (08/28/15)   Status New   PT LONG TERM GOAL #2   Title Pt will improve the Modified oswestry to <20% by 10/09/15 to show significant improvement in self-preceived low back pain and improved ability to perform bending and lifting activities.   Baseline 32% (08/28/15)   Status New   PT LONG TERM GOAL #3   Title Pt will be able to lift a 35# weight to waist height without pain by 10/09/15 to show ability to lift grandson into his car seat.   Baseline Unable to lift without onset of intense pain.   Status New   PT LONG TERM GOAL #4   Title Pt will be independent with HEP focusing on motor control, AROM, muscular endurance, and strength by 10/09/15 in order to return to prior level of function.    Baseline Dependent with HEP requiring frequent tactile and verbal cueing to perform.   Status New               Plan - 09/05/15 1646    Clinical Impression Statement Patient is making progress towards long term goals with improved motor control/technique when performing therapeutic exercises. Although patient is improving, limitations remain in muscular endurance/strength and motor control and patient will benefit from skilled therapy to return to prior level of function.     Pt will benefit from skilled therapeutic intervention in order to improve on the following deficits Decreased mobility;Decreased strength;Hypomobility;Impaired flexibility;Pain;Impaired UE functional use;Increased muscle spasms;Decreased endurance;Decreased range of motion;Decreased coordination;Increased fascial restricitons   Rehab Potential Good   Clinical Impairments Affecting Rehab Potential (+) acute injury (-) previous cancer   PT Frequency 2x / week   PT Duration 6 weeks   PT Treatment/Interventions ADLs/Self Care Home Management;Electrical Stimulation;Iontophoresis 4mg /ml Dexamethasone;Cryotherapy;Moist Heat;Therapeutic exercise;Therapeutic activities;Neuromuscular  re-education;Patient/family education;Manual techniques;Passive range of motion   PT Next Visit Plan Reassess mobility, ice massage (if inflammation persists)   PT Home Exercise Plan scapular retraction, left thoracic rotation, straight arm push downs, serratus punches, Push up PLUS,    Consulted and Agree with Plan of Care Patient        Problem List Patient Active Problem List   Diagnosis Date Noted  . Midline thoracic back pain 08/15/2015  . Microscopic hematuria 05/08/2015  . Vertigo 10/27/2014  . Malignant neoplasm of breast (female) ([place]) 11/19/2012  . Breast CA ([place]) 11/19/2012  . Routine general medical examination at a health care facility 10/27/2012  . Screening for colon cancer 10/27/2012  . Hyperlipidemia 02/17/2011  . Osteopenia 02/17/2011    Blythe Stanford, SPT 09/05/2015, 5:08 PM  [facility] [address], [place], [address] Phone: [phone]   Fax:  [phone]  Name: Deborah Mcconnell MRN: [id number] Date of Birth: [birth year]

## 2015-09-10 ENCOUNTER — Other Ambulatory Visit: Payer: Self-pay | Admitting: Internal Medicine

## 2015-09-10 ENCOUNTER — Encounter: Payer: Self-pay | Admitting: Internal Medicine

## 2015-09-10 ENCOUNTER — Ambulatory Visit: Payer: Medicare Other | Admitting: Physical Therapy

## 2015-09-10 ENCOUNTER — Ambulatory Visit (INDEPENDENT_AMBULATORY_CARE_PROVIDER_SITE_OTHER): Payer: Medicare Other | Admitting: Internal Medicine

## 2015-09-10 ENCOUNTER — Encounter: Payer: Self-pay | Admitting: Physical Therapy

## 2015-09-10 VITALS — BP 115/78 | HR 68 | Temp 97.6°F | Resp 16 | Ht 71.5 in | Wt 165.4 lb

## 2015-09-10 DIAGNOSIS — Z23 Encounter for immunization: Secondary | ICD-10-CM

## 2015-09-10 DIAGNOSIS — M546 Pain in thoracic spine: Secondary | ICD-10-CM

## 2015-09-10 DIAGNOSIS — R102 Pelvic and perineal pain: Secondary | ICD-10-CM

## 2015-09-10 DIAGNOSIS — R0781 Pleurodynia: Secondary | ICD-10-CM

## 2015-09-10 NOTE — Patient Instructions (Addendum)
We will set up pelvic ultrasound.  Follow up 4 weeks and sooner as needed.

## 2015-09-10 NOTE — Assessment & Plan Note (Signed)
Symptoms improving with PT. Will continue to monitor. 

## 2015-09-10 NOTE — Progress Notes (Signed)
Pre visit review using our clinic review tool, if applicable. No additional management support is needed unless otherwise documented below in the visit note. 

## 2015-09-10 NOTE — Assessment & Plan Note (Signed)
Recent episode of pelvic pressure and vaginal discharge. Given h/o breast CA and use of Letrozole, will get pelvic US for evaluation. Last PAP 2014, normal, HPV neg.

## 2015-09-10 NOTE — Addendum Note (Signed)
Addended by: Kyra Manges on: 09/10/2015 08:48 AM   Modules accepted: Orders

## 2015-09-10 NOTE — Therapy (Signed)
[facility] [address], [place], [address] Phone: [phone]   Fax:  [phone]  Physical Therapy Treatment  Patient Details  Name: Deborah Mcconnell MRN: [id number] Date of Birth: 1950-10-17 Referring Provider: Jackolyn Confer, MD  Encounter Date: 09/10/2015      PT End of Session - 09/10/15 1815    Visit Number 5   Number of Visits 12   Date for PT Re-Evaluation 10/09/15   Authorization Type 5   Authorization Time Period 10 (G Code)   PT Start Time [id number]   PT Stop Time 1616   PT Time Calculation (min) 43 min   Activity Tolerance Patient tolerated treatment well;No increased pain   Behavior During Therapy [facility] for tasks assessed/performed      Past Medical History  Diagnosis Date  . Vertigo   . Hyperlipidemia   . Osteopenia     previously taking Evista and Actonel  . Osteoporosis   . Cancer [facility])     Past Surgical History  Procedure Laterality Date  . Cholecystectomy    . Lt breast lumpectomy      There were no vitals filed for this visit.  Visit Diagnosis:  Left-sided thoracic back pain  Rib pain on left side      Subjective Assessment - 09/10/15 1534    Subjective Pt reports similar thoracic pain since previous visit. States muscle pain in the back is improving reporting decreased symptoms at rest.    Pertinent History Acute on chronic insidious onset of throacic back pain and radiating pain in the ribs on ~07/21/15. Pain improved with bed rest over the past week (when sick). Hx of breast cancer and lumpectomy 2014/2015.    Limitations Sitting;Reading;Lifting   Patient Stated Goals Lifting grandson (~30#) into car seat, Sitting for longer period of time without pain, being able to carry objects without discomfort.   Currently in Pain? Yes   Pain Score 3    Pain Location Back   Pain Orientation Left;Mid   Pain Descriptors / Indicators Aching;Constant;Tender   Pain Type Acute  pain   Pain Onset More than a month ago      Objective: Observation:AROM: WNL with pain at end range towards right and left rotation Palpation: left thoracic paraspinals along iliocostalis levels T5-8 with central tenderness located over spinous processes T5-8.  Therapeutic Exercise: Patient performed exercises with guidance, verbal and tactile cues and demonstration of therapist:  Thoracic rotation sidebending bilaterally in sitting -- x 15 Straight arm pull downs at [place] --  x15 #15; x10 #20 -- stopped secondary to fatigue Scapular retractions at [facility] -- unilaterally 2x15 #10  Overhead body blade -- 2 x 15sec bilaterally Thoracic extension with towel -- x 10  Multifidus crunch in sitting -- x15    Manual Therapy: STM using deep and superficial techniques on left side along iliocostalis to resolve restrictions and muscle guarding . Myofascial release techniques performed along erector spinae to decrease spasms. Patient positioned in sitting  Response to Treatment: Spasms decreased 50% along thoracic paraspinals after performing deep and superficial techniques. Good demonstration of form requiring minimal tactile cueing when performing straight arm pull downs at [facility]. Decreased pain with thoracic rotation after performing thoracic extension with the towel.            PT Long Term Goals - 08/28/15 1537    PT LONG TERM GOAL #1   Title Pt will improve the FABQ-PA to 10/30 by 10/09/15  to show significant improvement in self-preceived fear avoidance to allow for the ability to perform physical activties without increased fear.    Baseline 18/30 FABQ-PA (08/28/15)   Status New   PT LONG TERM GOAL #2   Title Pt will improve the Modified oswestry to <20% by 10/09/15 to show significant improvement in self-preceived low back pain and improved ability to perform bending and lifting activities.   Baseline 32% (08/28/15)   Status New   PT LONG TERM GOAL #3   Title Pt will be able to  lift a 35# weight to waist height without pain by 10/09/15 to show ability to lift grandson into his car seat.   Baseline Unable to lift without onset of intense pain.   Status New   PT LONG TERM GOAL #4   Title Pt will be independent with HEP focusing on motor control, AROM, muscular endurance, and strength by 10/09/15 in order to return to prior level of function.    Baseline Dependent with HEP requiring frequent tactile and verbal cueing to perform.   Status New               Plan - 09/10/15 1816    Clinical Impression Statement Patient is making progress towards long term goals demonstrating ability to perform functional exercises with improved motor control and increased strength. Requires minimal cueing to perform exercises with proper technique and exhibits onset of fatigue with exercise indicating decreased muscular endurance. Patient will benefit from further skilled therapy to address musculoskeletal limitations to return to prior level of function.    Pt will benefit from skilled therapeutic intervention in order to improve on the following deficits Decreased mobility;Decreased strength;Hypomobility;Impaired flexibility;Pain;Impaired UE functional use;Increased muscle spasms;Decreased endurance;Decreased range of motion;Decreased coordination;Increased fascial restricitons   Rehab Potential Good   Clinical Impairments Affecting Rehab Potential (+) acute injury (-) previous cancer   PT Frequency 2x / week   PT Duration 6 weeks   PT Treatment/Interventions ADLs/Self Care Home Management;Electrical Stimulation;Iontophoresis 4mg /ml Dexamethasone;Cryotherapy;Moist Heat;Therapeutic exercise;Therapeutic activities;Neuromuscular re-education;Patient/family education;Manual techniques;Passive range of motion   PT Next Visit Plan Reassess mobility, ice massage (if inflammation persists)   PT Home Exercise Plan scapular retraction, left thoracic rotation, straight arm push downs, serratus  punches, Push up PLUS, Overhead low trap activation   Consulted and Agree with Plan of Care Patient        Problem List Patient Active Problem List   Diagnosis Date Noted  . Pelvic pain in female 09/10/2015  . Midline thoracic back pain 08/15/2015  . Microscopic hematuria 05/08/2015  . Vertigo 10/27/2014  . Malignant neoplasm of breast (female) ([place]) 11/19/2012  . Breast CA ([place]) 11/19/2012  . Routine general medical examination at a health care facility 10/27/2012  . Screening for colon cancer 10/27/2012  . Hyperlipidemia 02/17/2011  . Osteopenia 02/17/2011    Blythe Stanford, SPT 09/10/2015, 6:23 PM  [facility] [address], [place], [address] Phone: [phone]   Fax:  [phone]  Name: Deborah Mcconnell MRN: [id number] Date of Birth: [birth year]

## 2015-09-10 NOTE — Progress Notes (Signed)
Subjective:    Patient ID: Deborah Mcconnell, female    DOB: July 05, 1950, 65 y.o.   MRN: [id number]  HPI  65YO female presents for follow up.  Recently seen 08/2015 for mid thoracic back pain. Started PT 2 weeks ago. Pain has been improving. Bought a TENS unit with some improvement. Not taking anything for pain. No further radiating pain.  Abdominal pain - 1 week ago, developed some lower abdominal pain similar to menstrual cramping, then had some mucous-like vaginal discharge. No intercourse prior. No recurrent symptoms. No urinary symptoms or changes in bowel habits.   Wt Readings from Last 3 Encounters:  09/10/15 165 lb 6.4 oz (75.025 kg)  08/15/15 167 lb (75.751 kg)  08/02/15 167 lb 9.6 oz (76.023 kg)   BP Readings from Last 3 Encounters:  09/10/15 115/78  08/15/15 109/72  08/02/15 110/68    Past Medical History  Diagnosis Date  . Vertigo   . Hyperlipidemia   . Osteopenia     previously taking Evista and Actonel  . Osteoporosis   . Cancer [facility])    Family History  Problem Relation Age of Onset  . Osteoporosis Mother   . Osteoporosis Sister   . Hypertension Mother   . Osteoarthritis Mother   . Osteoarthritis Sister   . Osteoarthritis Maternal Grandmother   . Diabetes Maternal Grandfather   . Cancer Paternal Grandfather     unknown   Past Surgical History  Procedure Laterality Date  . Cholecystectomy    . Lt breast lumpectomy     Social History   Social History  . Marital Status: Married    Spouse Name: N/A  . Number of Children: N/A  . Years of Education: N/A   Social History Main Topics  . Smoking status: Never Smoker   . Smokeless tobacco: Never Used  . Alcohol Use: 0.0 oz/week    0 Standard drinks or equivalent per week     Comment: rare  . Drug Use: No  . Sexual Activity: Yes   Other Topics Concern  . None   Social History Narrative    Review of Systems  Constitutional: Negative for fever, chills, appetite change, fatigue and  unexpected weight change.  Eyes: Negative for visual disturbance.  Respiratory: Negative for shortness of breath.   Cardiovascular: Negative for chest pain and leg swelling.  Gastrointestinal: Negative for nausea, vomiting, abdominal pain, diarrhea and constipation.  Genitourinary: Positive for vaginal discharge and pelvic pain. Negative for dysuria, urgency, frequency, flank pain, vaginal bleeding and vaginal pain.  Musculoskeletal: Positive for myalgias, back pain and arthralgias.  Skin: Negative for color change and rash.  Hematological: Negative for adenopathy. Does not bruise/bleed easily.  Psychiatric/Behavioral: Negative for dysphoric mood. The patient is not nervous/anxious.        Objective:    BP 115/78 mmHg  Pulse 68  Temp(Src) 97.6 F (36.4 C) (Oral)  Resp 16  Ht 5' 11.5" (1.816 m)  Wt 165 lb 6.4 oz (75.025 kg)  BMI 22.75 kg/m2  SpO2 100% Physical Exam  Constitutional: She is oriented to person, place, and time. She appears well-developed and well-nourished. No distress.  HENT:  Head: Normocephalic and atraumatic.  Right Ear: External ear normal.  Left Ear: External ear normal.  Nose: Nose normal.  Mouth/Throat: Oropharynx is clear and moist. No oropharyngeal exudate.  Eyes: Conjunctivae and EOM are normal. Pupils are equal, round, and reactive to light. Right eye exhibits no discharge. Left eye exhibits no discharge. No scleral icterus.  Neck: Normal range of motion. Neck supple. No tracheal deviation present. No thyromegaly present.  Cardiovascular: Normal rate, regular rhythm, normal heart sounds and intact distal pulses.  Exam reveals no gallop and no friction rub.   No murmur heard. Pulmonary/Chest: Effort normal and breath sounds normal. No respiratory distress. She has no wheezes. She has no rales. She exhibits no tenderness.  Abdominal: Soft. Bowel sounds are normal. She exhibits no distension and no mass. There is no tenderness. There is no rebound and no  guarding.  Musculoskeletal: Normal range of motion. She exhibits no edema or tenderness.  Lymphadenopathy:    She has no cervical adenopathy.  Neurological: She is alert and oriented to person, place, and time. No cranial nerve deficit. She exhibits normal muscle tone. Coordination normal.  Skin: Skin is warm and dry. No rash noted. She is not diaphoretic. No erythema. No pallor.  Psychiatric: She has a normal mood and affect. Her behavior is normal. Judgment and thought content normal.          Assessment & Plan:   Problem List Items Addressed This Visit      Unprioritized   Midline thoracic back pain - Primary    Symptoms improving with PT. Will continue to monitor.      Pelvic pain in female    Recent episode of pelvic pressure and vaginal discharge. Given h/o breast CA and use of Letrozole, will get pelvic US for evaluation. Last PAP 2014, normal, HPV neg.      Relevant Orders   US Transvaginal Non-OB       Return in about 4 weeks (around 10/08/2015) for Recheck with PAP.  Ronette Deter, MD Internal [facility]

## 2015-09-12 ENCOUNTER — Ambulatory Visit: Payer: Medicare Other | Admitting: Physical Therapy

## 2015-09-12 ENCOUNTER — Encounter: Payer: Self-pay | Admitting: Physical Therapy

## 2015-09-12 DIAGNOSIS — R0781 Pleurodynia: Secondary | ICD-10-CM

## 2015-09-12 DIAGNOSIS — M546 Pain in thoracic spine: Secondary | ICD-10-CM | POA: Diagnosis not present

## 2015-09-12 NOTE — Therapy (Signed)
[facility] [address] [place], [address] Phone: [phone]   Fax:  [phone]  Physical Therapy Treatment  Patient Details  Name: Deborah Mcconnell MRN: [id number] Date of Birth: [birth year] Referring Provider: Jackolyn Confer, MD  Encounter Date: 09/12/2015      PT End of Session - 09/12/15 1656    Visit Number 6   Number of Visits 12   Date for PT Re-Evaluation 10/09/15   Authorization Type 6   Authorization Time Period 10 (G Code)   PT Start Time [id number]   PT Stop Time [id number]   PT Time Calculation (min) 44 min   Activity Tolerance Patient tolerated treatment well;No increased pain   Behavior During Therapy [facility] for tasks assessed/performed      Past Medical History  Diagnosis Date  . Vertigo   . Hyperlipidemia   . Osteopenia     previously taking Evista and Actonel  . Osteoporosis   . Cancer [facility])     Past Surgical History  Procedure Laterality Date  . Cholecystectomy    . Lt breast lumpectomy      There were no vitals filed for this visit.  Visit Diagnosis:  Rib pain on left side  Left-sided thoracic back pain      Subjective Assessment - 09/12/15 1536    Subjective Pt reports simliar symptoms from the previous visist. States increased radiating pain along T5-8 with laughing, coughing and sneezing.   Limitations Sitting;Reading;Lifting   Patient Stated Goals Lifting grandson (~30#) into car seat, Sitting for longer period of time without pain, being able to carry objects without discomfort.   Currently in Pain? Yes   Pain Score 2    Pain Location Back   Pain Orientation Left;Mid   Pain Descriptors / Indicators Aching;Constant;Tender   Pain Type Acute pain   Pain Onset More than a month ago      Objective: Observation: Decreased thoracic extension limited 33% Palpation: Tenderness of left thoracic paraspinals along iliocostalis levels T5-9 with central tenderness located  over spinous processes T6-8.  Therapeutic Exercise: Patient performed exercises with guidance, verbal and tactile cues and demonstration of therapist:  UBE in standing -- 3 min (20sec forward/backward) Straight arm pull downs at [employer] -- x15 #15; x10 #20 -- stopped secondary to fatigue Scapular retractions at [facility] -- 2x15 #15  Overhead/shoulder flexion @ 90 straight arm body blade both hands -- 2 x 30sec bilaterally in standing Scapular Retraction at [employer] -- x20 # 10 (To decreased aggravated symptoms) Multifidus crunch in sitting -- x15    Manual Therapy: STM using deep techniques on left side along iliocostalis to resolve restrictions and muscle guarding. Myofascial release techniques performed to erector spinae to decrease spasms/thoracic pain. Patient positioned in massage chair.   Response to Treatment: Spasms decreased 33% along thoracic paraspinals after performing deep soft tissue mobilization techniques. Good demonstration of technique requiring minimal tactile cueing to activate scapular stabilization musculature when performing exercises with body blade. Aggravated symptoms after performing body blade exercises and required performance of scapular retraction to decrease.          PT Education - 09/12/15 1655    Education provided Yes   Education Details HEP: Multifidus crunch, simulated body blade exercises   Person(s) Educated Patient   Methods Explanation;Demonstration;Verbal cues   Comprehension Verbal cues required;Returned demonstration;Verbalized understanding             PT Long Term Goals - 08/28/15 1537  PT LONG TERM GOAL #1   Title Pt will improve the FABQ-PA to 10/30 by 10/09/15 to show significant improvement in self-preceived fear avoidance to allow for the ability to perform physical activties without increased fear.    Baseline 18/30 FABQ-PA (08/28/15)   Status New   PT LONG TERM GOAL #2   Title Pt will improve the Modified oswestry to <20%  by 10/09/15 to show significant improvement in self-preceived low back pain and improved ability to perform bending and lifting activities.   Baseline 32% (08/28/15)   Status New   PT LONG TERM GOAL #3   Title Pt will be able to lift a 35# weight to waist height without pain by 10/09/15 to show ability to lift grandson into his car seat.   Baseline Unable to lift without onset of intense pain.   Status New   PT LONG TERM GOAL #4   Title Pt will be independent with HEP focusing on motor control, AROM, muscular endurance, and strength by 10/09/15 in order to return to prior level of function.    Baseline Dependent with HEP requiring frequent tactile and verbal cueing to perform.   Status New               Plan - 09/12/15 1657    Clinical Impression Statement Patient making progress towards long term goals demonstrating improvements in muscular coordination and strength. Increased aggravation in symptoms after performing body blade activities indicating decreased muscular endurance and pt will benefit from further skilled therapy aimed at improving impairments to return to prior level of function.    Pt will benefit from skilled therapeutic intervention in order to improve on the following deficits Decreased mobility;Decreased strength;Hypomobility;Impaired flexibility;Pain;Impaired UE functional use;Increased muscle spasms;Decreased endurance;Decreased range of motion;Decreased coordination;Increased fascial restricitons   Rehab Potential Good   Clinical Impairments Affecting Rehab Potential (+) acute injury (-) previous cancer   PT Frequency 2x / week   PT Duration 6 weeks   PT Treatment/Interventions ADLs/Self Care Home Management;Electrical Stimulation;Iontophoresis 4mg /ml Dexamethasone;Cryotherapy;Moist Heat;Therapeutic exercise;Therapeutic activities;Neuromuscular re-education;Patient/family education;Manual techniques;Passive range of motion   PT Next Visit Plan thoracic extension   PT  Home Exercise Plan scapular retraction, left thoracic rotation, straight arm push downs, serratus punches, Push up PLUS, Overhead low trap activation   Consulted and Agree with Plan of Care Patient        Problem List Patient Active Problem List   Diagnosis Date Noted  . Pelvic pain in female 09/10/2015  . Midline thoracic back pain 08/15/2015  . Microscopic hematuria 05/08/2015  . Vertigo 10/27/2014  . Malignant neoplasm of breast (female) ([place]) 11/19/2012  . Breast CA ([place]) 11/19/2012  . Routine general medical examination at a health care facility 10/27/2012  . Screening for colon cancer 10/27/2012  . Hyperlipidemia 02/17/2011  . Osteopenia 02/17/2011    Blythe Stanford, SPT 09/12/2015, 5:02 PM  [facility] [address] [place], [address] Phone: [phone]   Fax:  [phone]  Name: Deborah Mcconnell MRN: [id number] Date of Birth: 1951/02/19

## 2015-09-14 ENCOUNTER — Ambulatory Visit: Payer: Medicare Other

## 2015-09-17 ENCOUNTER — Ambulatory Visit
Admission: RE | Admit: 2015-09-17 | Discharge: 2015-09-17 | Disposition: A | Discharge status: Home / Self Care | Payer: Medicare Other | Source: Ambulatory Visit | Attending: Internal Medicine | Admitting: Internal Medicine

## 2015-09-17 DIAGNOSIS — R102 Pelvic and perineal pain: Secondary | ICD-10-CM | POA: Insufficient documentation

## 2015-09-17 DIAGNOSIS — D259 Leiomyoma of uterus, unspecified: Secondary | ICD-10-CM | POA: Diagnosis not present

## 2015-09-19 ENCOUNTER — Ambulatory Visit: Payer: Medicare Other | Admitting: Physical Therapy

## 2015-09-19 ENCOUNTER — Encounter: Payer: Self-pay | Admitting: Physical Therapy

## 2015-09-19 DIAGNOSIS — R0781 Pleurodynia: Secondary | ICD-10-CM | POA: Diagnosis not present

## 2015-09-19 DIAGNOSIS — M546 Pain in thoracic spine: Secondary | ICD-10-CM | POA: Diagnosis not present

## 2015-09-19 NOTE — Therapy (Signed)
[facility] [address] [place], [address] Phone: [phone]   Fax:  [phone]  Physical Therapy Treatment  Patient Details  Name: Deborah Mcconnell MRN: [id number] Date of Birth: [birth year] Referring Provider: Jackolyn Confer, MD  Encounter Date: 09/19/2015      PT End of Session - 09/19/15 1528    Visit Number 7   Number of Visits 12   Date for PT Re-Evaluation 10/09/15   Authorization Type 7   Authorization Time Period 10 (G Code)   PT Start Time [id number]   PT Stop Time 1513   PT Time Calculation (min) 40 min   Activity Tolerance Patient tolerated treatment well;No increased pain   Behavior During Therapy [facility] for tasks assessed/performed      Past Medical History  Diagnosis Date  . Vertigo   . Hyperlipidemia   . Osteopenia     previously taking Evista and Actonel  . Osteoporosis   . Cancer [facility])     Past Surgical History  Procedure Laterality Date  . Cholecystectomy    . Lt breast lumpectomy      There were no vitals filed for this visit.  Visit Diagnosis:  Left-sided thoracic back pain  Rib pain on left side      Subjective Assessment - 09/19/15 1436    Subjective Pt reports pain increased on the right side of mid-back after her previous treatment and reports increased spasms. Pain decreased the following day and has continued to improve. She feels the UBE may have contributed to her spasms.    Limitations Sitting;Reading;Lifting   Patient Stated Goals Lifting grandson (~30#) into car seat, Sitting for longer period of time without pain, being able to carry objects without discomfort.   Currently in Pain? Yes   Pain Score 2    Pain Location Back   Pain Orientation Left;Right;Mid   Pain Descriptors / Indicators Aching;Constant   Pain Type Acute pain      Objective: Observation: Decreased thoracic extension limited 25%, All other thoracic movements WNL-- slight increase in  symptoms when performing lateral flexion to the right and left rotation  Palpation: Tenderness of left thoracic paraspinals along iliocostalis levels T5-9 with central tenderness located over spinous processes T6-8. (decreased from previous session)  Therapeutic Exercise: Patient performed exercises with guidance, verbal and tactile cues and demonstration of therapist: Prone lying -- 1 min -- decreased pain today versus previous visits.  UBE in standing -- 2 min (15sec alternating forward/backward) (decreased intensity to 90 rpms and time down to 2 min.) Straight arm pull downs at [employer] -- 2x15 #15 Scapular retractions at [facility] -- 2x15 #15  Shoulder flexion @ 90 and 0 straight arm body blade both hands -- 2 x 30sec in standing (yellow and black) Thoracic Extension in sitting over the back of a chair -- x10  Manual Therapy: STM using deep techniques bilaterally along thoracic paraspinals to resolve fascial restrictions and guarding. Myofascial release techniques performed to decrease spasms/thoracic pain while patient positioned in massage chair.   Response to Treatment: Spasms decrease 50% after performing STM to the thoracic paraspinals indicating improved tissue elasticity. Increased protraction while performing body blade exercises and required tactile cueing to correct for proper muscular activation. Tolerated UBE well with modification of increasing rpms to 90 and decreasing time from 3 min. To 2 min.        PT Education - 09/19/15 1525    Education provided Yes  Education Details HEP: Thoracic extension over chair, educated on amount to perform exercises.   Person(s) Educated Patient   Methods Explanation;Demonstration   Comprehension Verbalized understanding;Returned demonstration             PT Long Term Goals - 08/28/15 1537    PT LONG TERM GOAL #1   Title Pt will improve the FABQ-PA to 10/30 by 10/09/15 to show significant improvement in self-preceived fear  avoidance to allow for the ability to perform physical activties without increased fear.    Baseline 18/30 FABQ-PA (08/28/15)   Status New   PT LONG TERM GOAL #2   Title Pt will improve the Modified oswestry to <20% by 10/09/15 to show significant improvement in self-preceived low back pain and improved ability to perform bending and lifting activities.   Baseline 32% (08/28/15)   Status New   PT LONG TERM GOAL #3   Title Pt will be able to lift a 35# weight to waist height without pain by 10/09/15 to show ability to lift grandson into his car seat.   Baseline Unable to lift without onset of intense pain.   Status New   PT LONG TERM GOAL #4   Title Pt will be independent with HEP focusing on motor control, AROM, muscular endurance, and strength by 10/09/15 in order to return to prior level of function.    Baseline Dependent with HEP requiring frequent tactile and verbal cueing to perform.   Status New               Plan - 09/19/15 1548    Clinical Impression Statement Patient is progressing well towards long term goals demonstrating improved thoracic mobility with decreased onset of pain indicating improved motor control and endurance. Improvement in muscular endurance today when performing therapeutic exercises noted with  increased repetitions. Patient will benefit from further therapy for strengthening, motor control and endurance exercise in order to return to prior level of function.    Pt will benefit from skilled therapeutic intervention in order to improve on the following deficits Decreased mobility;Decreased strength;Hypomobility;Impaired flexibility;Pain;Impaired UE functional use;Increased muscle spasms;Decreased endurance;Decreased range of motion;Decreased coordination;Increased fascial restricitons   Rehab Potential Good   Clinical Impairments Affecting Rehab Potential (+) acute injury (-) previous cancer   PT Frequency 2x / week   PT Duration 6 weeks   PT  Treatment/Interventions ADLs/Self Care Home Management;Electrical Stimulation;Iontophoresis 4mg /ml Dexamethasone;Cryotherapy;Moist Heat;Therapeutic exercise;Therapeutic activities;Neuromuscular re-education;Patient/family education;Manual techniques;Passive range of motion   PT Next Visit Plan thoracic extension   PT Home Exercise Plan scapular retraction, left thoracic rotation, straight arm push downs, serratus punches, Push up PLUS, Overhead low trap activation   Consulted and Agree with Plan of Care Patient        Problem List Patient Active Problem List   Diagnosis Date Noted  . Pelvic pain in female 09/10/2015  . Midline thoracic back pain 08/15/2015  . Microscopic hematuria 05/08/2015  . Vertigo 10/27/2014  . Malignant neoplasm of breast (female) ([place]) 11/19/2012  . Breast CA ([place]) 11/19/2012  . Routine general medical examination at a health care facility 10/27/2012  . Screening for colon cancer 10/27/2012  . Hyperlipidemia 02/17/2011  . Osteopenia 02/17/2011    Blythe Stanford, SPT 09/19/2015, 3:52 PM  [facility] [address], [place], [address] Phone: [phone]   Fax:  [phone]  Name: Cristin Grenfell MRN: [id number] Date of Birth: December 13, 1950

## 2015-09-26 ENCOUNTER — Ambulatory Visit: Payer: Medicare Other | Admitting: Physical Therapy

## 2015-09-26 ENCOUNTER — Encounter: Payer: Self-pay | Admitting: Physical Therapy

## 2015-09-26 DIAGNOSIS — R0781 Pleurodynia: Secondary | ICD-10-CM | POA: Diagnosis not present

## 2015-09-26 DIAGNOSIS — M546 Pain in thoracic spine: Secondary | ICD-10-CM | POA: Diagnosis not present

## 2015-09-26 NOTE — Therapy (Signed)
[facility] [address], [place], [address] Phone: [phone]   Fax:  [phone]  Physical Therapy Treatment  Patient Details  Name: Deborah Mcconnell MRN: [id number] Date of Birth: 07-Sep-1950 Referring Provider: Jackolyn Confer, MD  Encounter Date: 09/26/2015      PT End of Session - 09/26/15 1847    Visit Number 8   Number of Visits 12   Date for PT Re-Evaluation 10/09/15   Authorization Type 8   Authorization Time Period 10 (G Code)   PT Start Time [id number]   PT Stop Time 1603   PT Time Calculation (min) 49 min   Activity Tolerance Patient tolerated treatment well;No increased pain   Behavior During Therapy [facility] for tasks assessed/performed      Past Medical History  Diagnosis Date  . Vertigo   . Hyperlipidemia   . Osteopenia     previously taking Evista and Actonel  . Osteoporosis   . Cancer [facility])     Past Surgical History  Procedure Laterality Date  . Cholecystectomy    . Lt breast lumpectomy      There were no vitals filed for this visit.  Visit Diagnosis:  Rib pain on left side  Left-sided thoracic back pain      Subjective Assessment - 09/26/15 1515    Subjective Pt reports no current pain but states pain onsets when she sneezes, coughs, or laughs intensely. States pain intensity has significantly decreased. Also reported increased left shoulder pain over the past few weeks, worse with overhead activities ie; putting on/off shirt, sports bra.    Limitations Sitting;Reading;Lifting   Patient Stated Goals Lifting grandson (~30#) into car seat, Sitting for longer period of time without pain, being able to carry objects without discomfort.   Currently in Pain? No/denies      Objective: Observation:, All other thoracic movements WNL-- slight increase in symptoms when performing lateral flexion to the right and left rotation  Palpation: Tenderness of left thoracic paraspinals  along iliocostalis levels T5-7 with central tenderness located over spinous processes T5-7. (decreased from previous session)  Therapeutic Exercise: Patient performed exercises with guidance, verbal and tactile cues and demonstration of therapist: UBE in standing -- 2.5 min (15sec alternating forward/backward) (intensity to 90 rpms and time down to 2.5 min.) Straight arm pull downs at [place] unilateral-- 2 x 15 #5 Scapular retractions at [place] unilateral-- 2 x 15 #10  Shoulder flexion @ 90 and 0 straight arm body blade both hands -- 2 x 30sec in standing (yellow and black) Reverse chin up unilateral at [facility] in sitting -- 2 x 15 #10 Shoulder flexion against wall with yellow physioball -- x 20  Manual Therapy: STM using deep techniques bilaterally along thoracic paraspinals to resolve fascial restrictions and guarding. Myofascial release techniques performed to decrease spasms/thoracic pain while patient positioned in massage chair.   Response to Treatment: Spasms decrease 25% after performing STM to the thoracic paraspinals indicating improved tissue elasticity. Good demonstration of technique when performing unilateral scapular retraction requiring tactile cueing to activate mid/lower traps. Patient reported increased pain with PA pressure over ~ T6/7  Spinous processes, reported increased left shoulder pain with exercises and demonstrated good understanding of impingement precautions for left shoulder         PT Education - 09/26/15 1846    Education provided Yes   Education Details HEP: shoulder flexion against wall with yellow physioball    Person(s) Educated Patient  Methods Explanation;Demonstration   Comprehension Verbalized understanding;Returned demonstration             PT Long Term Goals - 08/28/15 1537    PT LONG TERM GOAL #1   Title Pt will improve the FABQ-PA to 10/30 by 10/09/15 to show significant improvement in self-preceived fear avoidance to allow for the  ability to perform physical activties without increased fear.    Baseline 18/30 FABQ-PA (08/28/15)   Status New   PT LONG TERM GOAL #2   Title Pt will improve the Modified oswestry to <20% by 10/09/15 to show significant improvement in self-preceived low back pain and improved ability to perform bending and lifting activities.   Baseline 32% (08/28/15)   Status New   PT LONG TERM GOAL #3   Title Pt will be able to lift a 35# weight to waist height without pain by 10/09/15 to show ability to lift grandson into his car seat.   Baseline Unable to lift without onset of intense pain.   Status New   PT LONG TERM GOAL #4   Title Pt will be independent with HEP focusing on motor control, AROM, muscular endurance, and strength by 10/09/15 in order to return to prior level of function.    Baseline Dependent with HEP requiring frequent tactile and verbal cueing to perform.   Status New               Plan - 09/26/15 1902    Clinical Impression Statement Patient making progress towards long term goals with decreased resting pain today indicating functional carryover between visits. Patient continues to experience decreased motor control with scapular motions and will benefit from further skilled therapy to return to prior level of function.    Pt will benefit from skilled therapeutic intervention in order to improve on the following deficits Decreased mobility;Decreased strength;Hypomobility;Impaired flexibility;Pain;Impaired UE functional use;Increased muscle spasms;Decreased endurance;Decreased range of motion;Decreased coordination;Increased fascial restricitons   Rehab Potential Good   Clinical Impairments Affecting Rehab Potential (+) acute injury (-) previous cancer   PT Frequency 2x / week   PT Duration 6 weeks   PT Treatment/Interventions ADLs/Self Care Home Management;Electrical Stimulation;Iontophoresis 4mg /ml Dexamethasone;Cryotherapy;Moist Heat;Therapeutic exercise;Therapeutic  activities;Neuromuscular re-education;Patient/family education;Manual techniques;Passive range of motion   PT Next Visit Plan thoracic extension   PT Home Exercise Plan scapular retraction, left thoracic rotation, straight arm push downs, serratus punches, Push up PLUS, Overhead low trap activation   Consulted and Agree with Plan of Care Patient        Problem List Patient Active Problem List   Diagnosis Date Noted  . Pelvic pain in female 09/10/2015  . Midline thoracic back pain 08/15/2015  . Microscopic hematuria 05/08/2015  . Vertigo 10/27/2014  . Malignant neoplasm of breast (female) ([place]) 11/19/2012  . Breast CA ([place]) 11/19/2012  . Routine general medical examination at a health care facility 10/27/2012  . Screening for colon cancer 10/27/2012  . Hyperlipidemia 02/17/2011  . Osteopenia 02/17/2011    Blythe Stanford, SPT 09/26/2015, 7:11 PM  [facility] [address] [place], [address] Phone: [phone]   Fax:  [phone]  Name: Deborah Mcconnell MRN: [id number] Date of Birth: [birth year]

## 2015-09-28 ENCOUNTER — Encounter: Payer: Self-pay | Admitting: Physical Therapy

## 2015-09-28 ENCOUNTER — Ambulatory Visit: Payer: Medicare Other | Admitting: Physical Therapy

## 2015-09-28 DIAGNOSIS — R0781 Pleurodynia: Secondary | ICD-10-CM | POA: Diagnosis not present

## 2015-09-28 DIAGNOSIS — M546 Pain in thoracic spine: Secondary | ICD-10-CM | POA: Diagnosis not present

## 2015-09-28 NOTE — Therapy (Signed)
[facility] [facility] [address] [place], [address] Phone: [phone]   Fax:  [phone]  Physical Therapy Treatment  Patient Details  Name: Deborah Mcconnell MRN: [id number] Date of Birth: [birth year] Referring Provider: Jackolyn Confer, MD  Encounter Date: 09/28/2015      PT End of Session - 09/28/15 1131    Visit Number 9   Number of Visits 12   Date for PT Re-Evaluation 10/09/15   Authorization Type 9   Authorization Time Period 10 (G Code)   PT Start Time [id number]   PT Stop Time 0931   PT Time Calculation (min) 38 min   Activity Tolerance Patient tolerated treatment well;No increased pain   Behavior During Therapy [facility] for tasks assessed/performed      Past Medical History  Diagnosis Date  . Vertigo   . Hyperlipidemia   . Osteopenia     previously taking Evista and Actonel  . Osteoporosis   . Cancer [facility])     Past Surgical History  Procedure Laterality Date  . Cholecystectomy    . Lt breast lumpectomy      There were no vitals filed for this visit.  Visit Diagnosis:  Rib pain on left side  Left-sided thoracic back pain      Subjective Assessment - 09/28/15 0855    Subjective Pt reports minor tenderness and soreness for the past two days. States she feels better after therapy and is able to lift her grandson without pain.    Limitations Sitting;Reading;Lifting   Patient Stated Goals Lifting grandson (~30#) into car seat, Sitting for longer period of time without pain, being able to carry objects without discomfort.   Currently in Pain? Yes   Pain Score 2    Pain Location Back   Pain Orientation Left;Right;Mid   Pain Descriptors / Indicators Aching;Constant   Pain Type Acute pain   Pain Onset More than a month ago      Objective: Observation:, All other thoracic movements WNL-- slight increase in symptoms when performing lateral flexion to the right and left rotation  Palpation:  Tenderness of left thoracic paraspinals along iliocostalis levels T5-7 with central tenderness located over spinous processes T5-7. (decreased from previous session)  Therapeutic Exercise: Patient performed exercises with guidance, verbal and tactile cues and demonstration of therapist: UBE in standing -- 3 min (15sec alternating forward/backward) (intensity to 90 rpms and time to 3 min.) Scapular retractions at [place] unilateral-- 2 x 15 #12  Straight arm pull downs at [place] unilateral-- x 15 #5 Arm circles against the wall with 2# weighted ball (small ball)--2 x10 (counterclockwise/clockwise) Thoracic rotations in standing following D2 shoulder pattern -- x 15 bilaterally (Decreased overhead motion performed with left shoulder)  Pushup PLUS against the wall -- x 15 Shoulder flexion against wall with yellow physioball -- x 20  Manual Therapy: STM using deep techniques bilaterally along thoracic paraspinals to resolve fascial restrictions and muscle guarding. Myofascial release techniques performed to decrease spasms/thoracic pain while patient positioned in massage chair.   Response to Treatment: Spasms decrease 25% within thoracic paraspinals after performing manual therapy. Good demonstration of technique when performing push up PLUS against wall requiring minimal cueing for serratus anterior activation.         PT Education - 09/28/15 1130    Education provided Yes   Education Details HEP: Push up PLUS against wall, ball circles against wall   Person(s) Educated Patient   Methods Explanation;Demonstration  Comprehension Verbalized understanding             PT Long Term Goals - 08/28/15 1537    PT LONG TERM GOAL #1   Title Pt will improve the FABQ-PA to 10/30 by 10/09/15 to show significant improvement in self-preceived fear avoidance to allow for the ability to perform physical activties without increased fear.    Baseline 18/30 FABQ-PA (08/28/15)   Status New   PT LONG  TERM GOAL #2   Title Pt will improve the Modified oswestry to <20% by 10/09/15 to show significant improvement in self-preceived low back pain and improved ability to perform bending and lifting activities.   Baseline 32% (08/28/15)   Status New   PT LONG TERM GOAL #3   Title Pt will be able to lift a 35# weight to waist height without pain by 10/09/15 to show ability to lift grandson into his car seat.   Baseline Unable to lift without onset of intense pain.   Status New   PT LONG TERM GOAL #4   Title Pt will be independent with HEP focusing on motor control, AROM, muscular endurance, and strength by 10/09/15 in order to return to prior level of function.    Baseline Dependent with HEP requiring frequent tactile and verbal cueing to perform.   Status New               Plan - 09/28/15 1142    Clinical Impression Statement Patient making progress towards long term goals with improvements in musuclar endurance signified by increased exercise tolerance and progression. Although patient is improving she continues to demonstrate decreased motor contol with scapular movements and will benefit from further skilled therapy to return to prior level of function.    Pt will benefit from skilled therapeutic intervention in order to improve on the following deficits Decreased mobility;Decreased strength;Hypomobility;Impaired flexibility;Pain;Impaired UE functional use;Increased muscle spasms;Decreased endurance;Decreased range of motion;Decreased coordination;Increased fascial restricitons   Rehab Potential Good   Clinical Impairments Affecting Rehab Potential (+) acute injury (-) previous cancer   PT Frequency 2x / week   PT Duration 6 weeks   PT Treatment/Interventions ADLs/Self Care Home Management;Electrical Stimulation;Iontophoresis 4mg /ml Dexamethasone;Cryotherapy;Moist Heat;Therapeutic exercise;Therapeutic activities;Neuromuscular re-education;Patient/family education;Manual techniques;Passive  range of motion   PT Next Visit Plan thoracic extension   PT Home Exercise Plan scapular retraction, left thoracic rotation, straight arm push downs, serratus punches, Push up PLUS, Overhead low trap activation   Consulted and Agree with Plan of Care Patient        Problem List Patient Active Problem List   Diagnosis Date Noted  . Pelvic pain in female 09/10/2015  . Midline thoracic back pain 08/15/2015  . Microscopic hematuria 05/08/2015  . Vertigo 10/27/2014  . Malignant neoplasm of breast (female) ([place]) 11/19/2012  . Breast CA ([place]) 11/19/2012  . Routine general medical examination at a health care facility 10/27/2012  . Screening for colon cancer 10/27/2012  . Hyperlipidemia 02/17/2011  . Osteopenia 02/17/2011    Blythe Stanford, SPT 09/28/2015, 11:45 AM  [facility] [address] [place], [address] Phone: [phone]   Fax:  [phone]  Name: Tyiesha Filho MRN: [id number] Date of Birth: 1950-09-05

## 2015-10-01 ENCOUNTER — Encounter: Payer: Self-pay | Admitting: Physical Therapy

## 2015-10-01 ENCOUNTER — Ambulatory Visit: Payer: Medicare Other | Attending: Internal Medicine | Admitting: Physical Therapy

## 2015-10-01 DIAGNOSIS — M6281 Muscle weakness (generalized): Secondary | ICD-10-CM | POA: Insufficient documentation

## 2015-10-01 DIAGNOSIS — R0781 Pleurodynia: Secondary | ICD-10-CM | POA: Diagnosis not present

## 2015-10-01 DIAGNOSIS — M546 Pain in thoracic spine: Secondary | ICD-10-CM | POA: Diagnosis not present

## 2015-10-01 NOTE — Therapy (Addendum)
[place] [facility] [address] [place], [address] Phone: [phone]   Fax:  [phone]  Physical Therapy Treatment  Patient Details  Name: Deborah Mcconnell MRN: [id number] Date of Birth: [birth year] Referring Provider: Jackolyn Confer, MD  Encounter Date: 10/01/2015      PT End of Session - 10/01/15 1645    Visit Number 10   Number of Visits 12   Date for PT Re-Evaluation 10/09/15   Authorization Type 10   Authorization Time Period 10 (G Code)   PT Start Time 1450   PT Stop Time 1534   PT Time Calculation (min) 44 min   Activity Tolerance Patient tolerated treatment well;No increased pain   Behavior During Therapy [facility] for tasks assessed/performed      Past Medical History  Diagnosis Date  . Vertigo   . Hyperlipidemia   . Osteopenia     previously taking Evista and Actonel  . Osteoporosis   . Cancer [facility])     Past Surgical History  Procedure Laterality Date  . Cholecystectomy    . Lt breast lumpectomy      There were no vitals filed for this visit.  Visit Diagnosis:  Left-sided thoracic back pain  Rib pain on left side      Subjective Assessment - 10/01/15 1452    Subjective Pt reports some minor increase of pain the previous day with episodes lasting 3-4 seconds. Pt states she is much improved compared to her initial evaluation.   Limitations Sitting;Reading;Lifting   Patient Stated Goals Lifting grandson (~30#) into car seat, Sitting for longer period of time without pain, being able to carry objects without discomfort.   Currently in Pain? Yes   Pain Score 1    Pain Location Back   Pain Orientation Left;Right;Mid   Pain Descriptors / Indicators Aching;Constant   Pain Type Acute pain   Pain Onset More than a month ago      Objective: Observation:, All other thoracic movements WNL-- slight increase in rotation to the right  Palpation:  Tenderness of left thoracic paraspinals  along iliocostalis levels T5-7 with central tenderness located over spinous processes T5-7. (decreased from previous session) Posture: left scapula/shoulder higher than right, left scapula seated more laterally than right   Therapeutic Exercise: Patient performed exercises with guidance, verbal and tactile cues and demonstration of therapist: UBE in standing -- 3 min (15sec alternating forward/backward) (intensity to 60 rpms and time to 3 min.) Scapular retractions at [place] unilateral--  x 10 #13, x15 #15 bilaterally   Straight arm pull downs at [employer] --  2x 15 #15 Arm circles against the wall with 2# weighted ball (small ball)--2 x10 (counterclockwise/clockwise) Thoracic rotations in standing following D2 shoulder pattern -- x 20 bilaterally (Decreased overhead motion performed with left shoulder)   Pushup and a pushup PLUS against the wall -- x 15 Shoulder flexion against wall with yellow physioball -- x 20  Manual Therapy: STM using deep techniques bilaterally along thoracic paraspinals to resolve fascial restrictions and muscle guarding. Myofascial release techniques performed to decrease spasms/thoracic pain while patient positioned in massage chair.   Response to Treatment: Spasms decreased 33% within thoracic paraspinals after performing manual therapy. Good demonstration of technique when performing push up PLUS against wall requiring minimal cueing for serratus anterior activation. Increased fatigue today with exercises and required decrease in resistance to perform without aggravation of symptoms.         PT Education -  2015-10-09 1644    Education provided Yes   Education Details Educated to continue HEP at home and exercises for pain control   Person(s) Educated Patient   Methods Explanation;Demonstration   Comprehension Verbalized understanding             PT Long Term Goals - 08/28/15 1537    PT LONG TERM GOAL #1   Title Pt will improve the FABQ-PA to 10/30 by  10/09/15 to show significant improvement in self-preceived fear avoidance to allow for the ability to perform physical activties without increased fear.    Baseline 18/30 FABQ-PA (08/28/15)   Status New   PT LONG TERM GOAL #2   Title Pt will improve the Modified oswestry to <20% by 10/09/15 to show significant improvement in self-preceived low back pain and improved ability to perform bending and lifting activities.   Baseline 32% (08/28/15)   Status New   PT LONG TERM GOAL #3   Title Pt will be able to lift a 35# weight to waist height without pain by 10/09/15 to show ability to lift grandson into his car seat.   Baseline Unable to lift without onset of intense pain.   Status New   PT LONG TERM GOAL #4   Title Pt will be independent with HEP focusing on motor control, AROM, muscular endurance, and strength by 10/09/15 in order to return to prior level of function.    Baseline Dependent with HEP requiring frequent tactile and verbal cueing to perform.   Status New               Plan - 09-Oct-2015 1649    Clinical Impression Statement Patient demonstrated increased fatigue with exercise today secondary to increased activity over the weekend. Focused on increasing motor control and muscular endurance within the serratus anterior as patients demonstrates a scapular anterior tilt in resting position. Pt will benefit from further skilled therapy to address limitations to return to prior level of function.   Pt will benefit from skilled therapeutic intervention in order to improve on the following deficits Decreased mobility;Decreased strength;Hypomobility;Impaired flexibility;Pain;Impaired UE functional use;Increased muscle spasms;Decreased endurance;Decreased range of motion;Decreased coordination;Increased fascial restricitons   Rehab Potential Good   Clinical Impairments Affecting Rehab Potential (+) acute injury (-) previous cancer   PT Frequency 2x / week   PT Duration 6 weeks   PT  Treatment/Interventions ADLs/Self Care Home Management;Electrical Stimulation;Iontophoresis 4mg /ml Dexamethasone;Cryotherapy;Moist Heat;Therapeutic exercise;Therapeutic activities;Neuromuscular re-education;Patient/family education;Manual techniques;Passive range of motion   PT Next Visit Plan unilateral endurance   PT Home Exercise Plan scapular retraction, left thoracic rotation, straight arm push downs, serratus punches, Push up PLUS, Overhead low trap activation   Consulted and Agree with Plan of Care Patient          G-Codes - October 09, 2015 1630    Functional Assessment Tool Used modified oswestry, ROM, strength, pain scale, clinical judgment   Functional Limitation Carrying, moving and handling objects   Carrying, Moving and Handling Objects Current Status [id number]) At least 20 percent but less than 40 percent impaired, limited or restricted   Carrying, Moving and Handling Objects Goal Status [id number]) At least 1 percent but less than 20 percent impaired, limited or restricted      Problem List Patient Active Problem List   Diagnosis Date Noted  . Pelvic pain in female 09/10/2015  . Midline thoracic back pain 08/15/2015  . Microscopic hematuria 05/08/2015  . Vertigo 10/27/2014  . Malignant neoplasm of breast (female) ([place]) 11/19/2012  . Breast CA (  [place]) 11/19/2012  . Routine general medical examination at a health care facility 10/27/2012  . Screening for colon cancer 10/27/2012  . Hyperlipidemia 02/17/2011  . Osteopenia 02/17/2011    Blythe Stanford, SPT 10/02/2015, 10:05 AM  [facility] [address] [place], [address] Phone: [phone]   Fax:  [phone]  Name: Deborah Mcconnell MRN: [id number] Date of Birth: 03-Oct-1950

## 2015-10-04 ENCOUNTER — Ambulatory Visit: Payer: Medicare Other | Admitting: Physical Therapy

## 2015-10-08 ENCOUNTER — Other Ambulatory Visit (HOSPITAL_COMMUNITY)
Admission: RE | Admit: 2015-10-08 | Discharge: 2015-10-08 | Disposition: A | Discharge status: Home / Self Care | Payer: Medicare Other | Source: Ambulatory Visit | Attending: Internal Medicine | Admitting: Internal Medicine

## 2015-10-08 ENCOUNTER — Encounter: Payer: Self-pay | Admitting: Internal Medicine

## 2015-10-08 ENCOUNTER — Ambulatory Visit (INDEPENDENT_AMBULATORY_CARE_PROVIDER_SITE_OTHER): Payer: Medicare Other | Admitting: Internal Medicine

## 2015-10-08 VITALS — BP 103/64 | HR 73 | Temp 98.4°F | Ht 72.0 in | Wt 166.5 lb

## 2015-10-08 DIAGNOSIS — R102 Pelvic and perineal pain: Secondary | ICD-10-CM

## 2015-10-08 DIAGNOSIS — Z01419 Encounter for gynecological examination (general) (routine) without abnormal findings: Secondary | ICD-10-CM | POA: Insufficient documentation

## 2015-10-08 DIAGNOSIS — M546 Pain in thoracic spine: Secondary | ICD-10-CM | POA: Diagnosis not present

## 2015-10-08 DIAGNOSIS — Z1151 Encounter for screening for human papillomavirus (HPV): Secondary | ICD-10-CM | POA: Insufficient documentation

## 2015-10-08 DIAGNOSIS — Z124 Encounter for screening for malignant neoplasm of cervix: Secondary | ICD-10-CM | POA: Diagnosis not present

## 2015-10-08 NOTE — Progress Notes (Signed)
Subjective:    Patient ID: Deborah Mcconnell, female    DOB: 07-Nov-1950, 65 y.o.   MRN: [id number]  HPI  65YO female presents for follow up.  Recently seen for pelvic pain. US showed small uterine fibroid. No further pelvic pain. No new concerns.  Continues with PT for back pain. Pain improving. Occasionally has sharp, severe mid thoracic back pain. Comes and goes without clear trigger. No longer radiates to front of chest.    Wt Readings from Last 3 Encounters:  10/08/15 166 lb 8 oz (75.524 kg)  09/10/15 165 lb 6.4 oz (75.025 kg)  08/15/15 167 lb (75.751 kg)   BP Readings from Last 3 Encounters:  10/08/15 103/64  09/10/15 115/78  08/15/15 109/72    Past Medical History  Diagnosis Date  . Vertigo   . Hyperlipidemia   . Osteopenia     previously taking Evista and Actonel  . Osteoporosis   . Cancer [facility])    Family History  Problem Relation Age of Onset  . Osteoporosis Mother   . Osteoporosis Sister   . Hypertension Mother   . Osteoarthritis Mother   . Osteoarthritis Sister   . Osteoarthritis Maternal Grandmother   . Diabetes Maternal Grandfather   . Cancer Paternal Grandfather     unknown   Past Surgical History  Procedure Laterality Date  . Cholecystectomy    . Lt breast lumpectomy     Social History   Social History  . Marital Status: Married    Spouse Name: N/A  . Number of Children: N/A  . Years of Education: N/A   Social History Main Topics  . Smoking status: Never Smoker   . Smokeless tobacco: Never Used  . Alcohol Use: 0.0 oz/week    0 Standard drinks or equivalent per week     Comment: rare  . Drug Use: No  . Sexual Activity: Yes   Other Topics Concern  . None   Social History Narrative    Review of Systems  Constitutional: Negative for fever, chills, appetite change, fatigue and unexpected weight change.  Eyes: Negative for visual disturbance.  Respiratory: Negative for shortness of breath.   Cardiovascular: Negative for chest  pain and leg swelling.  Gastrointestinal: Negative for abdominal pain, diarrhea and constipation.  Genitourinary: Negative for dysuria, frequency, vaginal bleeding, vaginal discharge, vaginal pain, menstrual problem and pelvic pain.  Musculoskeletal: Positive for myalgias and arthralgias.  Skin: Negative for color change and rash.  Hematological: Negative for adenopathy. Does not bruise/bleed easily.  Psychiatric/Behavioral: Negative for sleep disturbance and dysphoric mood. The patient is not nervous/anxious.        Objective:    BP 103/64 mmHg  Pulse 73  Temp(Src) 98.4 F (36.9 C) (Oral)  Ht 6' (1.829 m)  Wt 166 lb 8 oz (75.524 kg)  BMI 22.58 kg/m2  SpO2 99% Physical Exam  Constitutional: She is oriented to person, place, and time. She appears well-developed and well-nourished. No distress.  HENT:  Head: Normocephalic and atraumatic.  Right Ear: External ear normal.  Left Ear: External ear normal.  Nose: Nose normal.  Mouth/Throat: Oropharynx is clear and moist. No oropharyngeal exudate.  Eyes: Conjunctivae are normal. Pupils are equal, round, and reactive to light. Right eye exhibits no discharge. Left eye exhibits no discharge. No scleral icterus.  Neck: Normal range of motion. Neck supple. No tracheal deviation present. No thyromegaly present.  Cardiovascular: Normal rate, regular rhythm, normal heart sounds and intact distal pulses.  Exam reveals no gallop  and no friction rub.   No murmur heard. Pulmonary/Chest: Effort normal and breath sounds normal. No respiratory distress. She has no wheezes. She has no rales. She exhibits no tenderness.  Abdominal: Hernia confirmed negative in the right inguinal area.  Genitourinary: Vagina normal and uterus normal. Cervix exhibits no motion tenderness and no discharge. Right adnexum displays no mass, no tenderness and no fullness. Left adnexum displays no mass, no tenderness and no fullness.  Musculoskeletal: Normal range of motion. She  exhibits no edema or tenderness.  Lymphadenopathy:    She has no cervical adenopathy.       Right: No inguinal adenopathy present.       Left: No inguinal adenopathy present.  Neurological: She is alert and oriented to person, place, and time. No cranial nerve deficit. She exhibits normal muscle tone. Coordination normal.  Skin: Skin is warm and dry. No rash noted. She is not diaphoretic. No erythema. No pallor.  Psychiatric: She has a normal mood and affect. Her behavior is normal. Judgment and thought content normal.          Assessment & Plan:   Problem List Items Addressed This Visit      Unprioritized   Midline thoracic back pain    Symptoms improving some with PT. Will continue. Follow up in June and sooner as needed.      Pelvic pain in female - Primary    No further pain. PAP today. Reviewed [place] which was normal except for 1.5cm fibroid.      Screening for cervical cancer    PAP sent today.          Return if symptoms worsen or fail to improve.  Ronette Deter, MD Internal [facility]

## 2015-10-08 NOTE — Patient Instructions (Signed)
We will call with PAP results.   Follow up in June as scheduled.

## 2015-10-08 NOTE — Assessment & Plan Note (Signed)
Symptoms improving some with PT. Will continue. Follow up in June and sooner as needed.

## 2015-10-08 NOTE — Progress Notes (Signed)
Pre visit review using our clinic review tool, if applicable. No additional management support is needed unless otherwise documented below in the visit note. 

## 2015-10-08 NOTE — Assessment & Plan Note (Signed)
PAP sent today. 

## 2015-10-08 NOTE — Assessment & Plan Note (Signed)
No further pain. PAP today. Reviewed [place] which was normal except for 1.5cm fibroid.

## 2015-10-08 NOTE — Addendum Note (Signed)
Addended by: Leeanne Rio on: 10/08/2015 03:41 PM   Modules accepted: Orders

## 2015-10-10 ENCOUNTER — Ambulatory Visit: Payer: Medicare Other | Admitting: Physical Therapy

## 2015-10-10 ENCOUNTER — Encounter: Payer: Self-pay | Admitting: Physical Therapy

## 2015-10-10 DIAGNOSIS — M6281 Muscle weakness (generalized): Secondary | ICD-10-CM

## 2015-10-10 DIAGNOSIS — M546 Pain in thoracic spine: Secondary | ICD-10-CM

## 2015-10-10 LAB — CYTOLOGY - PAP

## 2015-10-10 NOTE — Therapy (Signed)
[facility] [address], [place], [address] Phone: [phone]   Fax:  [phone]  Physical Therapy Treatment/Discharge Summary  Patient Details  Name: Deborah Mcconnell MRN: [id number] Date of Birth: 05/11/1951 Referring Provider: Jackolyn Confer, MD  Encounter Date: 10/10/2015    Patient began physical therapy 08/28/15 and attended 12 sessions throughout 10/10/15 with goals achieved and patient independent with home program for self management of symptoms and exercises. Plan discharge from physical therapy.   Past Medical History  Diagnosis Date  . Vertigo   . Hyperlipidemia   . Osteopenia     previously taking Evista and Actonel  . Osteoporosis   . Cancer [facility])     Past Surgical History  Procedure Laterality Date  . Cholecystectomy    . Lt breast lumpectomy      There were no vitals filed for this visit.      Subjective Assessment - 10/10/15 1001    Subjective Pt reports increased thoracic pain over the past week secondary to experiencing flu-like symptoms. States she's prepared for discharge. Reports she is able to lift grandson without onset of pain.    Limitations Sitting;Reading;Lifting   Patient Stated Goals Lifting grandson (~30#) into car seat, Sitting for longer period of time without pain, being able to carry objects without discomfort.   Currently in Pain? Yes   Pain Score 1    Pain Location Back   Pain Orientation Mid   Pain Descriptors / Indicators Aching;Constant   Pain Type Acute pain   Pain Onset More than a month ago     Objective: Reviewed HEP and exercises to be performed at home. Patient is independent with exercise performance and is prepared for discharge.  Outcome Measures: FABQ-PA 2/30, Modified Oswestry: 10%        PT Education - 10/10/15 1007    Education provided Yes   Education Details Condensed HEP and educated on frequency to perform exercises at home  for further improvement and pain control.    Person(s) Educated Patient   Methods Explanation   Comprehension Verbalized understanding             PT Long Term Goals - 10/10/15 0902    PT LONG TERM GOAL #1   Title Pt will improve the FABQ-PA to 10/30 by 10/09/15 to show significant improvement in self-preceived fear avoidance to allow for the ability to perform physical activties without increased fear.    Baseline 18/30 FABQ-PA (08/28/15) (10/10/15 2/30)   Status Achieved   PT LONG TERM GOAL #2   Title Pt will improve the Modified oswestry to <20% by 10/09/15 to show significant improvement in self-preceived low back pain and improved ability to perform bending and lifting activities.   Baseline 32% (08/28/15) (10/10/15    Status Achieved   PT LONG TERM GOAL #3   Title Pt will be able to lift a 35# weight to waist height without pain by 10/09/15 to show ability to lift grandson into his car seat.   Baseline Unable to lift without onset of intense pain. (10/10/15 No pain when lifitng grandson)   Status Achieved   PT LONG TERM GOAL #4   Title Pt will be independent with HEP focusing on motor control, AROM, muscular endurance, and strength by 10/09/15 in order to return to prior level of function.    Baseline Dependent with HEP requiring frequent tactile and verbal cueing to perform. (10/10/15 Independent with progression and form  for low back pain)   Status Achieved               Plan - 10/10/15 1010    Clinical Impression Statement Discharge summary performed today to evaluate acheivement of goals and condensing HEP for pain management/continuing improvement of symptoms at home. Significant improvement of FABQ and MODI scores indicating improved lumbar function. Patient  has met all long term goals and will be discharged from therapy.    Rehab Potential Good   Clinical Impairments Affecting Rehab Potential (+) acute injury (-) previous cancer   PT Frequency 2x / week   PT Duration 6  weeks   PT Treatment/Interventions ADLs/Self Care Home Management;Electrical Stimulation;Iontophoresis 76m/ml Dexamethasone;Cryotherapy;Moist Heat;Therapeutic exercise;Therapeutic activities;Neuromuscular re-education;Patient/family education;Manual techniques;Passive range of motion   PT Next Visit Plan unilateral endurance   PT Home Exercise Plan scapular retraction, left thoracic rotation, straight arm push downs, serratus punches, Push up PLUS, Overhead low trap activation   Consulted and Agree with Plan of Care Patient      Patient will benefit from skilled therapeutic intervention in order to improve the following deficits and impairments:  Decreased mobility, Decreased strength, Hypomobility, Impaired flexibility, Pain, Impaired UE functional use, Increased muscle spasms, Decreased endurance, Decreased range of motion, Decreased coordination, Increased fascial restricitons  Visit Diagnosis: Pain in thoracic spine  Muscle weakness (generalized)     Problem List Patient Active Problem List   Diagnosis Date Noted  . Screening for cervical cancer 10/08/2015  . Pelvic pain in female 09/10/2015  . Midline thoracic back pain 08/15/2015  . Microscopic hematuria 05/08/2015  . Vertigo 10/27/2014  . [place] [place] [place]19[place]  . [place] 11/19/2012  . Routine general medical examination at a health care facility 10/27/2012  . Screening for colon cancer 10/27/2012  . Hyperlipidemia 02/17/2011  . Osteopenia 02/17/2011    WBlythe Stanford SPT 10/10/2015, 3:20 PM  [facility] [address] [place] [address]Phone: [phone]  Fax:  [phone] Name: LKittie KrizanMRN: [id number] of Birth: 205/31/1952

## 2015-10-11 ENCOUNTER — Ambulatory Visit: Payer: Medicare Other | Admitting: Physical Therapy

## 2015-10-15 ENCOUNTER — Encounter: Payer: 59 | Admitting: Physical Therapy

## 2015-10-17 ENCOUNTER — Encounter: Payer: 59 | Admitting: Physical Therapy

## 2015-10-30 ENCOUNTER — Ambulatory Visit (INDEPENDENT_AMBULATORY_CARE_PROVIDER_SITE_OTHER): Payer: Medicare Other | Admitting: Nurse Practitioner

## 2015-10-30 ENCOUNTER — Encounter: Payer: Self-pay | Admitting: Nurse Practitioner

## 2015-10-30 VITALS — Temp 97.6°F | Ht 72.0 in | Wt 165.8 lb

## 2015-10-30 DIAGNOSIS — R42 Dizziness and giddiness: Secondary | ICD-10-CM

## 2015-10-30 NOTE — Progress Notes (Signed)
Patient ID: Wonda Amis, female    DOB: 1950/08/06  Age: 65 y.o. MRN: [id number]  CC: Fatigue and Dizziness   HPI Deborah Mcconnell presents for CC of fatigue and Dizziness x 8 days.   1) Pt reports fatigue and dizziness  BP low when checked 88/45 she reports she was lying and this is not unusual for her.  Saw Neurologist at [facility] thought it was silent migraines   ENT- negative for meniere's disease   Worst yesterday and day before, pt reports she couldn't get out of bed  Started last Wed. And felt slightly dizzy, some nausea, went to work, thur- normal all day, Friday- felt off and thought she was getting sick, evening couldn't get up off of couch then moved to bed and vomited once and felt improved.  Sat- nauseated, but no other concerns until Sat. Night then it happened again and stayed in bed from Sunday to Monday evening. Today woke up and was improved- no dizziness  Happened last year- last episodes   Felt a little sicker than usual with this last episode and thought it was a virus coming on  Started about 10 yrs ago started on meclizine and allergy medicine   Happens intermittently approx 12 episodes over 10 years    Treatment to date: Meclizine not helpful   Palpitations happened yesterday- tachy for 7 sec approx then back to normal. Denies palpitations today and chest pain/pressure.   Right ear behind ear pressure up to right side of head.   History Deborah Mcconnell has a past medical history of Vertigo; Hyperlipidemia; Osteopenia; Osteoporosis; and Cancer ([place]).   She has past surgical history that includes Cholecystectomy and lt breast lumpectomy.   Her family history includes Cancer in her paternal grandfather; Diabetes in her maternal grandfather; Hypertension in her mother; Osteoarthritis in her maternal grandmother, mother, and sister; Osteoporosis in her mother and sister.She reports that she has never smoked. She has never used smokeless tobacco. She reports that she  drinks alcohol. She reports that she does not use illicit drugs.  Outpatient Prescriptions Prior to Visit  Medication Sig Dispense Refill  . acetaminophen-codeine (TYLENOL #3) 300-30 MG per tablet     . aspirin EC 81 MG tablet Take 81 mg by mouth as needed.      . calcium carbonate (CALCIUM 600) 600 MG TABS tablet Take by mouth. Take 1,200 mg by mouth.    . cholecalciferol (VITAMIN D) 1000 UNITS tablet Take 1,000 Units by mouth daily.    Marland Kitchen ibuprofen (ADVIL,MOTRIN) 200 MG tablet Take 600 mg by mouth 2 (two) times daily. prn    . letrozole (FEMARA) 2.5 MG tablet TAKE ONE TABLET BY MOUTH ONCE DAILY 30 tablet 0  . meclizine (ANTIVERT) 25 MG tablet Take 1 tablet (25 mg total) by mouth 3 (three) times daily as needed. 90 tablet 6   No facility-administered medications prior to visit.    ROS Review of Systems  Constitutional: Positive for fatigue. Negative for fever, chills and diaphoresis.  Respiratory: Negative for chest tightness, shortness of breath and wheezing.   Cardiovascular: Negative for chest pain, palpitations and leg swelling.  Gastrointestinal: Positive for nausea. Negative for vomiting and diarrhea.  Neurological: Positive for dizziness and headaches. Negative for tremors, seizures, syncope, facial asymmetry, speech difficulty, weakness, light-headedness and numbness.    Objective:  Temp(Src) 97.6 F (36.4 C) (Oral)  Ht 6' (1.829 m)  Wt 165 lb 12.8 oz (75.206 kg)  BMI 22.48 kg/m2  SpO2 99%  No data found.   Physical Exam  Constitutional: She is oriented to person, place, and time. She appears well-developed and well-nourished. No distress.  HENT:  Head: Normocephalic and atraumatic.  Right Ear: External ear normal.  Left Ear: External ear normal.  Cardiovascular: Normal rate and regular rhythm.   Pulmonary/Chest: Effort normal and breath sounds normal. No respiratory distress. She has no wheezes. She has no rales. She exhibits no tenderness.  Neurological: She is  alert and oriented to person, place, and time. No cranial nerve deficit. She exhibits normal muscle tone. Coordination normal.  Skin: Skin is warm and dry. No rash noted. She is not diaphoretic.  Psychiatric: She has a normal mood and affect. Her behavior is normal. Judgment and thought content normal.   Assessment & Plan:   Aritza was seen today for fatigue and dizziness.  Diagnoses and all orders for this visit:  Vertigo  I am having Deborah Mcconnell maintain her aspirin EC, cholecalciferol, ibuprofen, meclizine, calcium carbonate, letrozole, and acetaminophen-codeine.  No orders of the defined types were placed in this encounter.     Follow-up: Return if symptoms worsen or fail to improve.

## 2015-10-30 NOTE — Patient Instructions (Signed)
Please follow up with Dr. Tomi Likens and Dr. Gilford Rile.   Benign Positional Vertigo Vertigo is the feeling that you or your surroundings are moving when they are not. Benign positional vertigo is the most common form of vertigo. The cause of this condition is not serious (is benign). This condition is triggered by certain movements and positions (is positional). This condition can be dangerous if it occurs while you are doing something that could endanger you or others, such as driving.  CAUSES In many cases, the cause of this condition is not known. It may be caused by a disturbance in an area of the inner ear that helps your brain to sense movement and balance. This disturbance can be caused by a viral infection (labyrinthitis), head injury, or repetitive motion. RISK FACTORS This condition is more likely to develop in:  Women.  People who are 65 years of age or older. SYMPTOMS Symptoms of this condition usually happen when you move your head or your eyes in different directions. Symptoms may start suddenly, and they usually last for less than a minute. Symptoms may include:  Loss of balance and falling.  Feeling like you are spinning or moving.  Feeling like your surroundings are spinning or moving.  Nausea and vomiting.  Blurred vision.  Dizziness.  Involuntary eye movement (nystagmus). Symptoms can be mild and cause only slight annoyance, or they can be severe and interfere with daily life. Episodes of benign positional vertigo may return (recur) over time, and they may be triggered by certain movements. Symptoms may improve over time. DIAGNOSIS This condition is usually diagnosed by medical history and a physical exam of the head, neck, and ears. You may be referred to a health care provider who specializes in ear, nose, and throat (ENT) problems (otolaryngologist) or a provider who specializes in disorders of the nervous system (neurologist). You may have additional testing,  including:  MRI.  A CT scan.  Eye movement tests. Your health care provider may ask you to change positions quickly while he or she watches you for symptoms of benign positional vertigo, such as nystagmus. Eye movement may be tested with an electronystagmogram (ENG), caloric stimulation, the Dix-Hallpike test, or the roll test.  An electroencephalogram (EEG). This records electrical activity in your brain.  Hearing tests. TREATMENT Usually, your health care provider will treat this by moving your head in specific positions to adjust your inner ear back to normal. Surgery may be needed in severe cases, but this is rare. In some cases, benign positional vertigo may resolve on its own in 2-4 weeks. HOME CARE INSTRUCTIONS Safety  Move slowly.Avoid sudden body or head movements.  Avoid driving.  Avoid operating heavy machinery.  Avoid doing any tasks that would be dangerous to you or others if a vertigo episode would occur.  If you have trouble walking or keeping your balance, try using a cane for stability. If you feel dizzy or unstable, sit down right away.  Return to your normal activities as told by your health care provider. Ask your health care provider what activities are safe for you. General Instructions  Take over-the-counter and prescription medicines only as told by your health care provider.  Avoid certain positions or movements as told by your health care provider.  Drink enough fluid to keep your urine clear or pale yellow.  Keep all follow-up visits as told by your health care provider. This is important. SEEK MEDICAL CARE IF:  You have a fever.  Your condition gets  worse or you develop new symptoms.  Your family or friends notice any behavioral changes.  Your nausea or vomiting gets worse.  You have numbness or a "pins and needles" sensation. SEEK IMMEDIATE MEDICAL CARE IF:  You have difficulty speaking or moving.  You are always dizzy.  You  faint.  You develop severe headaches.  You have weakness in your legs or arms.  You have changes in your hearing or vision.  You develop a stiff neck.  You develop sensitivity to light.   This information is not intended to replace advice given to you by your health care provider. Make sure you discuss any questions you have with your health care provider.   Document Released: 03/24/2006 Document Revised: 03/07/2015 Document Reviewed: 10/09/2014 Elsevier Interactive Patient Education [employer].

## 2015-11-01 NOTE — Assessment & Plan Note (Signed)
Discussed latest issue with vertigo. After discussion I encouraged her to follow back up with Dr. Tomi Likens since it has been 1 yr almost for further testing. Episodes are no more frequent than every 6 mos and last was 1 yr prior.  Pt was okay with this plan FU prn worsening/failure to improve.

## 2015-12-13 ENCOUNTER — Ambulatory Visit: Payer: BLUE CROSS/BLUE SHIELD | Admitting: Internal Medicine

## 2016-02-21 DIAGNOSIS — C50112 Malignant neoplasm of central portion of left female breast: Secondary | ICD-10-CM | POA: Diagnosis not present

## 2016-02-21 DIAGNOSIS — C50912 Malignant neoplasm of unspecified site of left female breast: Secondary | ICD-10-CM | POA: Diagnosis not present

## 2016-02-27 ENCOUNTER — Encounter: Payer: Self-pay | Admitting: Family

## 2016-02-27 ENCOUNTER — Ambulatory Visit (INDEPENDENT_AMBULATORY_CARE_PROVIDER_SITE_OTHER): Payer: Medicare Other | Admitting: Family

## 2016-02-27 VITALS — BP 124/58 | HR 80 | Temp 97.8°F | Wt 174.2 lb

## 2016-02-27 DIAGNOSIS — L821 Other seborrheic keratosis: Secondary | ICD-10-CM | POA: Diagnosis not present

## 2016-02-27 DIAGNOSIS — R300 Dysuria: Secondary | ICD-10-CM

## 2016-02-27 DIAGNOSIS — T782XXD Anaphylactic shock, unspecified, subsequent encounter: Secondary | ICD-10-CM | POA: Diagnosis not present

## 2016-02-27 DIAGNOSIS — I8393 Asymptomatic varicose veins of bilateral lower extremities: Secondary | ICD-10-CM | POA: Diagnosis not present

## 2016-02-27 DIAGNOSIS — L820 Inflamed seborrheic keratosis: Secondary | ICD-10-CM | POA: Diagnosis not present

## 2016-02-27 DIAGNOSIS — D229 Melanocytic nevi, unspecified: Secondary | ICD-10-CM | POA: Diagnosis not present

## 2016-02-27 DIAGNOSIS — Z23 Encounter for immunization: Secondary | ICD-10-CM | POA: Diagnosis not present

## 2016-02-27 DIAGNOSIS — L812 Freckles: Secondary | ICD-10-CM | POA: Diagnosis not present

## 2016-02-27 DIAGNOSIS — L718 Other rosacea: Secondary | ICD-10-CM | POA: Diagnosis not present

## 2016-02-27 DIAGNOSIS — Z1283 Encounter for screening for malignant neoplasm of skin: Secondary | ICD-10-CM | POA: Diagnosis not present

## 2016-02-27 DIAGNOSIS — I781 Nevus, non-neoplastic: Secondary | ICD-10-CM | POA: Diagnosis not present

## 2016-02-27 DIAGNOSIS — D1800 Hemangioma unspecified site: Secondary | ICD-10-CM | POA: Diagnosis not present

## 2016-02-27 LAB — POCT URINALYSIS DIPSTICK
Bilirubin, UA: NEGATIVE
GLUCOSE UA: NEGATIVE
Ketones, UA: NEGATIVE
NITRITE UA: NEGATIVE
Protein, UA: 100
Spec Grav, UA: 1.025
UROBILINOGEN UA: 0.2
pH, UA: 5

## 2016-02-27 MED ORDER — EPINEPHRINE 0.3 MG/0.3ML IJ SOAJ: 0.3000 mg | Freq: Once | INTRAMUSCULAR | 1 refills | Status: AC

## 2016-02-27 MED ORDER — CEPHALEXIN 500 MG PO CAPS: 500.0000 mg | ORAL_CAPSULE | Freq: Two times a day (BID) | ORAL | 0 refills | Status: AC

## 2016-02-27 NOTE — Progress Notes (Signed)
Pre visit review using our clinic review tool, if applicable. No additional management support is needed unless otherwise documented below in the visit note. 

## 2016-02-27 NOTE — Patient Instructions (Signed)
Drink plenty of water and take antibiotic as prescribed.   We are pending the urine culture to know the organism causing the infection and our office will call you with results. If the particular organism requires a different antibiotic than the on prescribed, we will place an order for a new prescription at that time.   If you symptoms worsen or you have new symptoms, please contact our office, or return to clinic for re evaluation.  Urinary Tract Infection Urinary tract infections (UTIs) can develop anywhere along your urinary tract. Your urinary tract is your body's drainage system for removing wastes and extra water. Your urinary tract includes two kidneys, two ureters, a bladder, and a urethra. Your kidneys are a pair of bean-shaped organs. Each kidney is about the size of your fist. They are located below your ribs, one on each side of your spine. CAUSES Infections are caused by microbes, which are microscopic organisms, including fungi, viruses, and bacteria. These organisms are so small that they can only be seen through a microscope. Bacteria are the microbes that most commonly cause UTIs. SYMPTOMS  Symptoms of UTIs may vary by age and gender of the patient and by the location of the infection. Symptoms in young women typically include a frequent and intense urge to urinate and a painful, burning feeling in the bladder or urethra during urination. Older women and men are more likely to be tired, shaky, and weak and have muscle aches and abdominal pain. A fever may mean the infection is in your kidneys. Other symptoms of a kidney infection include pain in your back or sides below the ribs, nausea, and vomiting. DIAGNOSIS To diagnose a UTI, your caregiver will ask you about your symptoms. Your caregiver will also ask you to provide a urine sample. The urine sample will be tested for bacteria and white blood cells. White blood cells are made by your body to help fight infection. TREATMENT    Typically, UTIs can be treated with medication. Because most UTIs are caused by a bacterial infection, they usually can be treated with the use of antibiotics. The choice of antibiotic and length of treatment depend on your symptoms and the type of bacteria causing your infection. HOME CARE INSTRUCTIONS  If you were prescribed antibiotics, take them exactly as your caregiver instructs you. Finish the medication even if you feel better after you have only taken some of the medication.  Drink enough water and fluids to keep your urine clear or pale yellow.  Avoid caffeine, tea, and carbonated beverages. They tend to irritate your bladder.  Empty your bladder often. Avoid holding urine for long periods of time.  Empty your bladder before and after sexual intercourse.  After a bowel movement, women should cleanse from front to back. Use each tissue only once. SEEK MEDICAL CARE IF:   You have back pain.  You develop a fever.  Your symptoms do not begin to resolve within 3 days. SEEK IMMEDIATE MEDICAL CARE IF:   You have severe back pain or lower abdominal pain.  You develop chills.  You have nausea or vomiting.  You have continued burning or discomfort with urination. MAKE SURE YOU:   Understand these instructions.  Will watch your condition.  Will get help right away if you are not doing well or get worse.   This information is not intended to replace advice given to you by your health care provider. Make sure you discuss any questions you have with your health care   provider.   Document Released: 03/26/2005 Document Revised: 03/07/2015 Document Reviewed: 07/25/2011 Elsevier Interactive Patient Education 2016 Elsevier Inc.    

## 2016-02-27 NOTE — Progress Notes (Signed)
Subjective:    Patient ID: Deborah Mcconnell, female    DOB: Apr 03, 1951, 65 y.o.   MRN: [id number]  CC: Deborah Mcconnell is a 65 y.o. female who presents today for an acute visit.    HPI: Patient here for acute visit for urinary frequency which started last night. Endorses dysuria, nausea, flank pain. No recurrent UTIs. No concern for STDs at this time. No fever or chills.  Will return for CPE.     HISTORY:  Past Medical History:  Diagnosis Date  . Cancer ([place])   . Hyperlipidemia   . Osteopenia    previously taking Evista and Actonel  . Osteoporosis   . Vertigo    Past Surgical History:  Procedure Laterality Date  . CHOLECYSTECTOMY    . lt breast lumpectomy     Family History  Problem Relation Age of Onset  . Osteoporosis Mother   . Osteoporosis Sister   . Hypertension Mother   . Osteoarthritis Mother   . Osteoarthritis Sister   . Osteoarthritis Maternal Grandmother   . Diabetes Maternal Grandfather   . Cancer Paternal Grandfather     unknown    Allergies: Ciprofloxacin; Ofloxacin; Quinolones; Actonel [risedronate]; and Augmentin [amoxicillin-pot clavulanate] Current Outpatient Prescriptions on File Prior to Visit  Medication Sig Dispense Refill  . calcium carbonate (CALCIUM 600) 600 MG TABS tablet Take by mouth. Take 1,200 mg by mouth.    . cholecalciferol (VITAMIN D) 1000 UNITS tablet Take 1,000 Units by mouth daily.    Marland Kitchen ibuprofen (ADVIL,MOTRIN) 200 MG tablet Take 600 mg by mouth 2 (two) times daily. prn    . letrozole (FEMARA) 2.5 MG tablet TAKE ONE TABLET BY MOUTH ONCE DAILY 30 tablet 0  . meclizine (ANTIVERT) 25 MG tablet Take 1 tablet (25 mg total) by mouth 3 (three) times daily as needed. 90 tablet 6   No current facility-administered medications on file prior to visit.     Social History  Substance Use Topics  . Smoking status: Never Smoker  . Smokeless tobacco: Never Used  . Alcohol use 0.0 oz/week     Comment: rare    Review of Systems    Constitutional: Negative for chills and fever.  Respiratory: Negative for cough.   Cardiovascular: Negative for chest pain and palpitations.  Gastrointestinal: Negative for nausea and vomiting.  Genitourinary: Positive for dysuria and frequency. Negative for flank pain and vaginal bleeding.      Objective:    BP (!) 124/58   Pulse 80   Temp 97.8 F (36.6 C) (Oral)   Wt 174 lb 3.2 oz (79 kg)   SpO2 97%   BMI 23.63 kg/m    Physical Exam  Constitutional: She appears well-developed and well-nourished.  Cardiovascular: Normal rate, regular rhythm, normal heart sounds and normal pulses.   Pulmonary/Chest: Effort normal and breath sounds normal. She has no wheezes. She has no rhonchi. She has no rales.  Abdominal: There is no CVA tenderness.  Neurological: She is alert.  Skin: Skin is warm and dry.  Psychiatric: She has a normal mood and affect. Her speech is normal and behavior is normal. Thought content normal.  Vitals reviewed.      Assessment & Plan:   1. Dysuria Urinalysis shows trace leukocytes, large blood. Treating empirically for UTI. Pending culture.  - POCT Urinalysis Dipstick - cephALEXin (KEFLEX) 500 MG capsule; Take 1 capsule (500 mg total) by mouth 2 (two) times daily.  Dispense: 14 capsule; Refill: 0  2. Anaphylactic reaction,  subsequent encounter Patient history of anaphylaxis with fluoroquinolones. Patient to have EpiPen on hand for safety.  - EPINEPHrine 0.3 mg/0.3 mL IJ SOAJ injection; Inject 0.3 mLs (0.3 mg total) into the muscle once.  Dispense: 1 Device; Refill: 1  3. Encounter for immunization - Flu vaccine HIGH DOSE PF    I have discontinued Ms. Foxworthy's aspirin EC and acetaminophen-codeine. I am also having her start on cephALEXin and EPINEPHrine. Additionally, I am having her maintain her cholecalciferol, ibuprofen, meclizine, calcium carbonate, letrozole, and naproxen sodium.   Meds ordered this encounter  Medications  . naproxen sodium  (ANAPROX) 220 MG tablet    Sig: Take by mouth.  . cephALEXin (KEFLEX) 500 MG capsule    Sig: Take 1 capsule (500 mg total) by mouth 2 (two) times daily.    Dispense:  14 capsule    Refill:  0    Order Specific Question:   Supervising Provider    Answer:   Deborra Medina L [[id number]]  . EPINEPHrine 0.3 mg/0.3 mL IJ SOAJ injection    Sig: Inject 0.3 mLs (0.3 mg total) into the muscle once.    Dispense:  1 Device    Refill:  1    Order Specific Question:   Supervising Provider    Answer:   Crecencio Mc [[id number]]    Return precautions given.   Risks, benefits, and alternatives of the medications and treatment plan prescribed today were discussed, and patient expressed understanding.   Education regarding symptom management and diagnosis given to patient on AVS.  Continue to follow with Rica Mast, MD for routine health maintenance.   Deborah Mcconnell and I agreed with plan.   Mable Paris, FNP

## 2016-02-29 LAB — URINE CULTURE: Organism ID, Bacteria: 10000

## 2016-03-01 ENCOUNTER — Telehealth: Payer: Self-pay | Admitting: Family

## 2016-03-01 DIAGNOSIS — R319 Hematuria, unspecified: Secondary | ICD-10-CM

## 2016-03-05 DIAGNOSIS — M8580 Other specified disorders of bone density and structure, unspecified site: Secondary | ICD-10-CM | POA: Diagnosis not present

## 2016-03-05 DIAGNOSIS — M899 Disorder of bone, unspecified: Secondary | ICD-10-CM | POA: Diagnosis not present

## 2016-03-05 DIAGNOSIS — E559 Vitamin D deficiency, unspecified: Secondary | ICD-10-CM | POA: Diagnosis not present

## 2016-03-05 DIAGNOSIS — Z79811 Long term (current) use of aromatase inhibitors: Secondary | ICD-10-CM | POA: Diagnosis not present

## 2016-03-11 ENCOUNTER — Other Ambulatory Visit (INDEPENDENT_AMBULATORY_CARE_PROVIDER_SITE_OTHER): Payer: Medicare Other

## 2016-03-11 ENCOUNTER — Other Ambulatory Visit: Payer: Self-pay | Admitting: Family

## 2016-03-11 DIAGNOSIS — R319 Hematuria, unspecified: Secondary | ICD-10-CM

## 2016-03-11 LAB — URINALYSIS, ROUTINE W REFLEX MICROSCOPIC
Bilirubin Urine: NEGATIVE
Ketones, ur: NEGATIVE
NITRITE: NEGATIVE
Specific Gravity, Urine: 1.01 (ref 1.000–1.030)
TOTAL PROTEIN, URINE-UPE24: NEGATIVE
URINE GLUCOSE: NEGATIVE
UROBILINOGEN UA: 0.2 (ref 0.0–1.0)
pH: 6.5 (ref 5.0–8.0)

## 2016-03-13 ENCOUNTER — Ambulatory Visit (INDEPENDENT_AMBULATORY_CARE_PROVIDER_SITE_OTHER): Payer: Medicare Other | Admitting: Urology

## 2016-03-13 ENCOUNTER — Encounter: Payer: Self-pay | Admitting: Urology

## 2016-03-13 VITALS — BP 143/78 | HR 76 | Ht 71.5 in | Wt 172.0 lb

## 2016-03-13 DIAGNOSIS — R3129 Other microscopic hematuria: Secondary | ICD-10-CM

## 2016-03-13 DIAGNOSIS — R300 Dysuria: Secondary | ICD-10-CM

## 2016-03-13 LAB — URINALYSIS, COMPLETE
Bilirubin, UA: NEGATIVE
GLUCOSE, UA: NEGATIVE
KETONES UA: NEGATIVE
Leukocytes, UA: NEGATIVE
NITRITE UA: NEGATIVE
Protein, UA: NEGATIVE
SPEC GRAV UA: 1.015 (ref 1.005–1.030)
UUROB: 0.2 mg/dL (ref 0.2–1.0)
pH, UA: 7 (ref 5.0–7.5)

## 2016-03-13 LAB — MICROSCOPIC EXAMINATION
BACTERIA UA: NONE SEEN
EPITHELIAL CELLS (NON RENAL): NONE SEEN /HPF (ref 0–10)

## 2016-03-13 NOTE — Progress Notes (Signed)
03/13/2016 8:35 AM   Deborah Mcconnell 12-07-1950 [id number]  Referring provider: Burnard Hawthorne, FNP [address] [place], [place] [address]  Chief Complaint  Patient presents with  . Hematuria    New Patient    HPI: 65 yo referred for further evaluation of microscopic hematuria.   On several occasions, she is developed episodes of dysuria, lower abdominal and pelvic pain, and urinary frequency which she felt was most likely an infection.  Point-of-care urinalyses at the time mostly appear unremarkable other than occasional trace or small leukocytes. Urine culture sent associated with these episodes are negative and she is been told to stop her antibiotics each time.  This has happened 2-3x over the past year.    Most recent urinalysis on 03/11/2016 shows 3- 6 red blood cells per high-powered field with an otherwise fairly unremarkable UA.  Repeat UA in our office today shows persistent microscopic hematuria with 3-10 red blood cells per high-powered field, otherwise negative.  She does have one documented urinary tract infection in 08/2015 at which time she grew Escherichia coli.  She is postmenopausal. She denies any dyspareunia but does have some vaginal dryness. No symptoms of pelvic organ prolapse or incontinence.  She tries to drink plenty of water.  She does have a personal history of breast cancer.  She is a never smoker.  Most recent cross-sectional imaging on 01/2013 CT abdomen and pelvis with contrast shows no GU abnormalities other than an likely artifactual right renal attenuation. No stones. She denies a personal history of kidney stones.   PMH: Past Medical History:  Diagnosis Date  . Cancer ([place])   . Hyperlipidemia   . Osteopenia    previously taking Evista and Actonel  . Osteoporosis   . Vertigo     Surgical History: Past Surgical History:  Procedure Laterality Date  . CHOLECYSTECTOMY    . lt breast lumpectomy      Home  Medications:    Medication List       Accurate as of 03/13/16 11:59 PM. Always use your most recent med list.          CALCIUM 600 600 MG Tabs tablet Generic drug:  calcium carbonate Take by mouth. Take 1,200 mg by mouth.   cholecalciferol 1000 units tablet Commonly known as:  VITAMIN D Take 1,000 Units by mouth daily.   ibuprofen 200 MG tablet Commonly known as:  ADVIL,MOTRIN Take 600 mg by mouth 2 (two) times daily. prn   letrozole 2.5 MG tablet Commonly known as:  FEMARA TAKE ONE TABLET BY MOUTH ONCE DAILY   meclizine 25 MG tablet Commonly known as:  ANTIVERT Take 1 tablet (25 mg total) by mouth 3 (three) times daily as needed.   naproxen sodium 220 MG tablet Commonly known as:  ANAPROX Take by mouth.       Allergies:  Allergies  Allergen Reactions  . Ciprofloxacin Anaphylaxis    Causes death  . Ofloxacin Anaphylaxis    Causes Death  . Quinolones Anaphylaxis  . Actonel [Risedronate]     Stomach pain  . Augmentin [Amoxicillin-Pot Clavulanate] Nausea And Vomiting    Family History: Family History  Problem Relation Age of Onset  . Osteoporosis Mother   . Hypertension Mother   . Osteoarthritis Mother   . Osteoporosis Sister   . Osteoarthritis Sister   . Osteoarthritis Maternal Grandmother   . Diabetes Maternal Grandfather   . Cancer Paternal Grandfather     unknown  . Prostate  cancer Neg Hx   . Bladder Cancer Neg Hx   . Kidney cancer Neg Hx     Social History:  reports that she has never smoked. She has never used smokeless tobacco. She reports that she drinks alcohol. She reports that she does not use drugs.  ROS: UROLOGY Frequent Urination?: No Hard to postpone urination?: No Burning/pain with urination?: No Get up at night to urinate?: No Leakage of urine?: No Urine stream starts and stops?: No Trouble starting stream?: No Do you have to strain to urinate?: No Blood in urine?: Yes Urinary tract infection?: No Sexually transmitted  disease?: No Injury to kidneys or bladder?: No Painful intercourse?: No Weak stream?: No Currently pregnant?: No Vaginal bleeding?: No Last menstrual period?: n  Gastrointestinal Nausea?: No Vomiting?: No Indigestion/heartburn?: No Diarrhea?: No Constipation?: No  Constitutional Fever: No Night sweats?: No Weight loss?: No Fatigue?: No  Skin Skin rash/lesions?: No Itching?: No  Eyes Blurred vision?: No Double vision?: No  Ears/Nose/Throat Sore throat?: No Sinus problems?: No  Hematologic/Lymphatic Swollen glands?: No Easy bruising?: No  Cardiovascular Leg swelling?: No Chest pain?: No  Respiratory Cough?: No Shortness of breath?: No  Endocrine Excessive thirst?: No  Musculoskeletal Back pain?: No Joint pain?: No  Neurological Headaches?: No Dizziness?: No  Psychologic Depression?: No Anxiety?: No  Physical Exam: BP (!) 143/78   Pulse 76   Ht 5' 11.5" (1.816 m)   Wt 172 lb (78 kg)   BMI 23.65 kg/m   Constitutional:  Alert and oriented, No acute distress. HEENT: [place] AT, moist mucus membranes.  Trachea midline, no masses. Cardiovascular: No clubbing, cyanosis, or edema. Respiratory: Normal respiratory effort, no increased work of breathing. GI: Abdomen is soft, nontender, nondistended, no abdominal masses GU: No CVA tenderness.  Skin: No rashes, bruises or suspicious lesions. Neurologic: Grossly intact, no focal deficits, moving all 4 extremities. Psychiatric: Normal mood and affect.  Laboratory Data: Lab Results  Component Value Date   WBC 6.4 05/15/2015   HGB 14.1 05/15/2015   HCT 41.8 05/15/2015   MCV 94.6 05/15/2015   PLT 341.0 05/15/2015    Lab Results  Component Value Date   CREATININE 0.64 05/15/2015   Urinalysis Results for orders placed or performed in visit on 03/13/16  Microscopic Examination  Result Value Ref Range   WBC, UA 0-5 0 - 5 /hpf   RBC, UA 3-10 (A) 0 - 2 /hpf   Epithelial Cells (non renal) None seen 0 -  10 /hpf   Crystals Present (A) N/A   Crystal Type Amorphous Sediment N/A   Mucus, UA Present (A) Not Estab.   Bacteria, UA None seen None seen/Few  Urinalysis, Complete  Result Value Ref Range   Specific Gravity, UA 1.015 1.005 - 1.030   pH, UA 7.0 5.0 - 7.5   Color, UA Yellow Yellow   Appearance Ur Hazy (A) Clear   Leukocytes, UA Negative Negative   Protein, UA Negative Negative/Trace   Glucose, UA Negative Negative   Ketones, UA Negative Negative   RBC, UA Trace (A) Negative   Bilirubin, UA Negative Negative   Urobilinogen, Ur 0.2 0.2 - 1.0 mg/dL   Nitrite, UA Negative Negative   Microscopic Examination See below:     Pertinent Imaging: CT  abd/ pelvis w/ contrast from 8/14 personally today, included delay with renal pelvic and ureteral imaging down to the mid ureter, negative for any filling defects or abnormalities.  Assessment & Plan:    1. Microscopic hematuria We discussed  the differential diagnosis for microscopic hematuria including nephrolithiasis, renal or upper tract tumors, bladder stones, UTIs, or bladder tumors as well as undetermined etiologies. Per AUA guidelines, I did recommend complete microscopic hematuria evaluation including upper tract imaging, possible urine cytology, and office cystoscopy.  Previous cross sectional imaging with contrast including delayed imaging 3 years ago unremarkable. Given that she is a nonsmoker and the low risk for upper tract urothelial abnormalities, we'll proceed with renal ultrasound and cystoscopy in lieu of repeat CT urogram.    She is agreeable with this plan.  - Urinalysis, Complete - US Renal; Future  2. Dysuria Recurrent episodes of pelvic pain, dysuria with no documented infections associated with these episodes. Encouraged adequate hydration. I also explained that there are other etiologies for pelvic pain/dysuria.  Briefly discussed IC today and handout for IC diet given.  Plan for pelvic exam at the time of  cystoscopy.   Return in about 4 weeks (around 04/10/2016) for cystoscopy/ f/u RUS.  [name], MD  [facility] [address] [place],  [address] [phone]

## 2016-03-17 NOTE — Telephone Encounter (Signed)
closed

## 2016-03-19 ENCOUNTER — Ambulatory Visit
Admission: RE | Admit: 2016-03-19 | Discharge: 2016-03-19 | Disposition: A | Discharge status: Home / Self Care | Payer: Medicare Other | Source: Ambulatory Visit | Attending: Urology | Admitting: Urology

## 2016-03-19 DIAGNOSIS — R319 Hematuria, unspecified: Secondary | ICD-10-CM | POA: Diagnosis not present

## 2016-03-19 DIAGNOSIS — N281 Cyst of kidney, acquired: Secondary | ICD-10-CM | POA: Diagnosis not present

## 2016-03-19 DIAGNOSIS — R3129 Other microscopic hematuria: Secondary | ICD-10-CM

## 2016-03-19 DIAGNOSIS — R300 Dysuria: Secondary | ICD-10-CM | POA: Insufficient documentation

## 2016-03-20 ENCOUNTER — Telehealth: Payer: Self-pay | Admitting: *Deleted

## 2016-03-20 NOTE — Telephone Encounter (Signed)
LMOM for patient to call office back. 

## 2016-03-20 NOTE — Telephone Encounter (Signed)
-----   Message from [name], MD sent at 03/20/2016  8:22 AM EDT ----- Renal ultrasound looks good.  We will see you back as scheduled for cystoscopy.    [name], MD

## 2016-03-21 NOTE — Telephone Encounter (Signed)
LMOM

## 2016-03-25 NOTE — Telephone Encounter (Signed)
LMOM

## 2016-03-26 ENCOUNTER — Ambulatory Visit: Payer: Self-pay | Admitting: Urology

## 2016-04-02 ENCOUNTER — Ambulatory Visit (INDEPENDENT_AMBULATORY_CARE_PROVIDER_SITE_OTHER): Payer: Medicare Other | Admitting: Family

## 2016-04-02 ENCOUNTER — Encounter: Payer: Self-pay | Admitting: Family

## 2016-04-02 VITALS — BP 126/82 | HR 61 | Temp 97.7°F | Ht 72.0 in | Wt 172.8 lb

## 2016-04-02 DIAGNOSIS — Z853 Personal history of malignant neoplasm of breast: Secondary | ICD-10-CM | POA: Diagnosis not present

## 2016-04-02 DIAGNOSIS — Z0000 Encounter for general adult medical examination without abnormal findings: Secondary | ICD-10-CM

## 2016-04-02 DIAGNOSIS — C50912 Malignant neoplasm of unspecified site of left female breast: Secondary | ICD-10-CM

## 2016-04-02 DIAGNOSIS — M1990 Unspecified osteoarthritis, unspecified site: Secondary | ICD-10-CM | POA: Diagnosis not present

## 2016-04-02 DIAGNOSIS — Z1159 Encounter for screening for other viral diseases: Secondary | ICD-10-CM | POA: Diagnosis not present

## 2016-04-02 DIAGNOSIS — Z0001 Encounter for general adult medical examination with abnormal findings: Secondary | ICD-10-CM

## 2016-04-02 DIAGNOSIS — Z114 Encounter for screening for human immunodeficiency virus [HIV]: Secondary | ICD-10-CM | POA: Diagnosis not present

## 2016-04-02 DIAGNOSIS — R3129 Other microscopic hematuria: Secondary | ICD-10-CM

## 2016-04-02 LAB — CBC WITH DIFFERENTIAL/PLATELET
BASOS ABS: 0 10*3/uL (ref 0.0–0.1)
Basophils Relative: 0.6 % (ref 0.0–3.0)
EOS ABS: 0.1 10*3/uL (ref 0.0–0.7)
Eosinophils Relative: 2.6 % (ref 0.0–5.0)
HEMATOCRIT: 43.3 % (ref 36.0–46.0)
Hemoglobin: 14.8 g/dL (ref 12.0–15.0)
LYMPHS ABS: 1.3 10*3/uL (ref 0.7–4.0)
LYMPHS PCT: 27.3 % (ref 12.0–46.0)
MCHC: 34.3 g/dL (ref 30.0–36.0)
MCV: 94.2 fl (ref 78.0–100.0)
MONOS PCT: 9 % (ref 3.0–12.0)
Monocytes Absolute: 0.4 10*3/uL (ref 0.1–1.0)
NEUTROS PCT: 60.5 % (ref 43.0–77.0)
Neutro Abs: 2.9 10*3/uL (ref 1.4–7.7)
Platelets: 273 10*3/uL (ref 150.0–400.0)
RBC: 4.6 Mil/uL (ref 3.87–5.11)
RDW: 12.8 % (ref 11.5–15.5)
WBC: 4.9 10*3/uL (ref 4.0–10.5)

## 2016-04-02 LAB — COMPREHENSIVE METABOLIC PANEL
ALK PHOS: 48 U/L (ref 39–117)
ALT: 12 U/L (ref 0–35)
AST: 18 U/L (ref 0–37)
Albumin: 4 g/dL (ref 3.5–5.2)
BILIRUBIN TOTAL: 0.5 mg/dL (ref 0.2–1.2)
BUN: 11 mg/dL (ref 6–23)
CO2: 31 mEq/L (ref 19–32)
CREATININE: 0.74 mg/dL (ref 0.40–1.20)
Calcium: 9.6 mg/dL (ref 8.4–10.5)
Chloride: 104 mEq/L (ref 96–112)
GFR: 83.55 mL/min (ref >60.00)
GLUCOSE: 100 mg/dL — AB (ref 70–99)
Potassium: 4.6 mEq/L (ref 3.5–5.1)
Sodium: 140 mEq/L (ref 135–145)
TOTAL PROTEIN: 7.1 g/dL (ref 6.0–8.3)

## 2016-04-02 LAB — LIPID PANEL
CHOL/HDL RATIO: 4
Cholesterol: 229 mg/dL — ABNORMAL HIGH (ref 0–200)
HDL: 64.9 mg/dL (ref >39.00)
LDL CALC: 150 mg/dL — AB (ref 0–99)
NONHDL: 164.03
Triglycerides: 69 mg/dL (ref 0.0–149.0)
VLDL: 13.8 mg/dL (ref 0.0–40.0)

## 2016-04-02 LAB — HEMOGLOBIN A1C: Hgb A1c MFr Bld: 5.3 % (ref 4.6–6.5)

## 2016-04-02 LAB — TSH: TSH: 1.87 u[IU]/mL (ref 0.35–4.50)

## 2016-04-02 LAB — VITAMIN D 25 HYDROXY (VIT D DEFICIENCY, FRACTURES): VITD: 30.03 ng/mL (ref 30.00–100.00)

## 2016-04-02 MED ORDER — DICLOFENAC SODIUM 1 % TD GEL: 4.0000 g | Freq: Four times a day (QID) | TRANSDERMAL | 3 refills | Status: AC

## 2016-04-02 NOTE — Progress Notes (Signed)
Subjective:    Patient ID: Deborah Mcconnell, female    DOB: [birth year], 65 y.o.   MRN: [id number]  CC: Deborah Mcconnell is a 65 y.o. female who presents today for physical exam.    HPI: Patient here for CPE.   She recently saw Dr. Erlene Quan with urology for persistent hematuria. Normal renal US. Pending cytoscope 10/18.   Only complaint is osteoarthritis in her knees and her hands which limits exercise. She does a lot of arts and crafts with her hands which she enjoys and this aggravates arthritis. In the past she did use a topical gel which worked well for her however she cannot recall the name. No swelling, increased warmth in joints.      Colorectal Cancer Screening: UTD 2016, hemorrhoids, no polyps.  Breast Cancer Screening: Mammogram UTD. H/o breast cancer. On femara. Follows with onc.  Cervical Cancer Screening: UTD, normal negative malignancy and HPV. Bone Health screening/DEXA for 65+: DEXA 2017 osteopenia right femur. Stable when compared to prior scans.  Not a candidate for biphosphates. Has esophaeal spasms when on Fosamax.   Immunizations       Tetanus - UTD        Pneumococcal - Done Hepatitis C screening - Candidate for HIV Screening- Candidate for  Labs: Screening labs today. Exercise: Not getting exercise currently.  Alcohol use: None Smoking/tobacco use: Nonsmoker.    HISTORY:  Past Medical History:  Diagnosis Date  . Cancer ([place])   . Hyperlipidemia   . Osteopenia    previously taking Evista and Actonel  . Osteoporosis   . Vertigo     Past Surgical History:  Procedure Laterality Date  . CHOLECYSTECTOMY    . lt breast lumpectomy     Family History  Problem Relation Age of Onset  . Osteoporosis Mother   . Hypertension Mother   . Osteoarthritis Mother   . Osteoporosis Sister   . Osteoarthritis Sister   . Osteoarthritis Maternal Grandmother   . Diabetes Maternal Grandfather   . Cancer Paternal Grandfather     unknown  . Prostate cancer Neg  Hx   . Bladder Cancer Neg Hx   . Kidney cancer Neg Hx       ALLERGIES: Ciprofloxacin; Ofloxacin; Quinolones; Actonel [risedronate]; and Augmentin [amoxicillin-pot clavulanate]  Current Outpatient Prescriptions on File Prior to Visit  Medication Sig Dispense Refill  . calcium carbonate (CALCIUM 600) 600 MG TABS tablet Take by mouth. Take 1,200 mg by mouth.    . cholecalciferol (VITAMIN D) 1000 UNITS tablet Take 1,000 Units by mouth daily.    Marland Kitchen ibuprofen (ADVIL,MOTRIN) 200 MG tablet Take 600 mg by mouth 2 (two) times daily. prn    . letrozole (FEMARA) 2.5 MG tablet TAKE ONE TABLET BY MOUTH ONCE DAILY 30 tablet 0  . meclizine (ANTIVERT) 25 MG tablet Take 1 tablet (25 mg total) by mouth 3 (three) times daily as needed. 90 tablet 6  . naproxen sodium (ANAPROX) 220 MG tablet Take by mouth.     No current facility-administered medications on file prior to visit.     Social History  Substance Use Topics  . Smoking status: Never Smoker  . Smokeless tobacco: Never Used  . Alcohol use 0.0 oz/week     Comment: rare    Review of Systems  Constitutional: Negative for chills, fever and unexpected weight change.  HENT: Negative for congestion.   Respiratory: Negative for cough.   Cardiovascular: Negative for chest pain, palpitations and leg swelling.  Gastrointestinal:  Negative for nausea and vomiting.  Genitourinary: Negative for dysuria and hematuria.  Musculoskeletal: Positive for arthralgias. Negative for joint swelling and myalgias.  Skin: Negative for rash.  Neurological: Negative for dizziness and headaches.  Hematological: Negative for adenopathy.  Psychiatric/Behavioral: Negative for confusion.      Objective:    BP 126/82   Pulse 61   Temp 97.7 F (36.5 C) (Oral)   Ht 6' (1.829 m)   Wt 172 lb 12.8 oz (78.4 kg)   SpO2 98%   BMI 23.44 kg/m   BP Readings from Last 3 Encounters:  04/02/16 126/82  03/13/16 (!) 143/78  02/27/16 (!) 124/58   Wt Readings from Last 3  Encounters:  04/02/16 172 lb 12.8 oz (78.4 kg)  03/13/16 172 lb (78 kg)  02/27/16 174 lb 3.2 oz (79 kg)    Physical Exam  Constitutional: She appears well-developed and well-nourished.  Eyes: Conjunctivae are normal.  Neck: No thyroid mass and no thyromegaly present.  Cardiovascular: Normal rate, regular rhythm, normal heart sounds and normal pulses.   Pulmonary/Chest: Effort normal and breath sounds normal. She has no wheezes. She has no rhonchi. She has no rales. Right breast exhibits no inverted nipple, no mass, no nipple discharge, no skin change and no tenderness. Left breast exhibits no inverted nipple, no mass, no nipple discharge, no skin change and no tenderness. Breasts are symmetrical.    CBE performed. Scar noted on diagram.  Lymphadenopathy:       Head (right side): No submental, no submandibular, no tonsillar, no preauricular, no posterior auricular and no occipital adenopathy present.       Head (left side): No submental, no submandibular, no tonsillar, no preauricular, no posterior auricular and no occipital adenopathy present.    She has no cervical adenopathy.       Right cervical: No superficial cervical, no deep cervical and no posterior cervical adenopathy present.      Left cervical: No superficial cervical, no deep cervical and no posterior cervical adenopathy present.    She has no axillary adenopathy.  Neurological: She is alert.  Skin: Skin is warm and dry.  Psychiatric: She has a normal mood and affect. Her speech is normal and behavior is normal. Thought content normal.  Vitals reviewed.      Assessment & Plan:   Problem List Items Addressed This Visit      Musculoskeletal and Integument   Osteoarthritis - Primary    Stable. Discussed topical treatment including Voltaren gel. Advised patient if she's currently taking naproxen every day she needs to be taking over-the-counter Prilosec as well. We also discussed Cymbalta for arthritis. Patient politely  declined starting medication today.      Relevant Medications   diclofenac sodium (VOLTAREN) 1 % GEL     Genitourinary   Microscopic hematuria    Following with urology. Pending cytoscope.        Other   Routine physical examination    Patient is up-to-date on colonoscopy and mammogram. She is also up-to-date on her Pap smear and has no history of abnormal Pap smears. She DEXA scan done this year which is stable. Immunizations are up-to-date. Screening labs to be done today including HIV and hepatitis C. Encouraged starting an exercise regimen.      Relevant Orders   CBC with Differential/Platelet   Comprehensive metabolic panel   Hemoglobin A1c   Hepatitis C antibody   HIV antibody   Lipid panel   TSH   VITAMIN D 25  Hydroxy (Vit-D Deficiency, Fractures)   Malignant neoplasm of breast (female) ([place])    Follows with [facility]. Normal mammogram this year.        Other Visit Diagnoses   None.      I am having Ms. Cisar start on diclofenac sodium. I am also having her maintain her cholecalciferol, ibuprofen, meclizine, calcium carbonate, letrozole, and naproxen sodium.   Meds ordered this encounter  Medications  . diclofenac sodium (VOLTAREN) 1 % GEL    Sig: Apply 4 g topically 4 (four) times daily.    Dispense:  1 Tube    Refill:  3    Order Specific Question:   Supervising Provider    Answer:   Crecencio Mc [[id number]]    Return precautions given.   Risks, benefits, and alternatives of the medications and treatment plan prescribed today were discussed, and patient expressed understanding.   Education regarding symptom management and diagnosis given to patient on AVS.   Continue to follow with Mable Paris, FNP for routine health maintenance.   Deborah Mcconnell and I agreed with plan.   Mable Paris, FNP

## 2016-04-02 NOTE — Assessment & Plan Note (Signed)
DEXA scan from this year is stable for osteopenia. 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 She is concerned as her mother had severe osteoporosis and her sister has osteopenia. We discussed at great length the importance of weightbearing exercise which patient is not doing this time largely because of arthritic knee pain. We discussed working her way back into exercise as well as eating a diet high in calcium and supplement with vitamin D. We discussed the CVD risks of taking too much calcium supplement

## 2016-04-02 NOTE — Assessment & Plan Note (Signed)
Stable. Discussed topical treatment including Voltaren gel. Advised patient if she's currently taking naproxen every day she needs to be taking over-the-counter Prilosec as well. We also discussed Cymbalta for arthritis. Patient politely declined starting medication today.

## 2016-04-02 NOTE — Assessment & Plan Note (Signed)
Follows with [facility]. Normal mammogram this year.

## 2016-04-02 NOTE — Patient Instructions (Addendum)
   We need to follow this and monitor again in 2 more years to see if improved or worsened.  For post menopausal women, guidelines recommend a diet with 1200 mg of Calcium per day. If you are eating calcium rich foods, you do not need a calcium supplement. The body better absorbs the calcium that you eat over supplementation. If you do supplement, I recommend not supplementing the full 1200 mg/ day as this can lead to increased risk of cardiovascular disease. I recommend Calcium Citrate over the counter, and you may take a total of 600 to 800 mg per day in divided doses with meals for best absorption.   For bone health, you need adequate vitamin D, and I recommend you supplement as it is harder to do so with diet alone. I recommend cholecalciferol 800 units daily.  Also, please ensure you are following a diet high in calcium -- research shows better outcomes with dietary sources including kale, yogurt, broccolii, cheese, okra, almonds- to name a few.     Also remember that exercise is a great medicine for maintain and preserve bone health. Advise moderate exercise for 30 minutes , 3 times per week.    Let's treat conservatively as we discussed.   Over-the-counter medications you may try for arthritic pain include:   ThermaCare patches   Capsaicin cream   Icy hot  Home exercises as we discussed below/handout.  If conservative treatment doesn't yield results, we will consider physical therapy, consult to Sports Medicine/Orthopedics for further evaluation, and imaging.   If there is no improvement in your symptoms, or if there is any worsening of symptoms, or if you have any additional concerns, please return for re-evaluation; or, if we are closed, consider going to the Emergency Room for evaluation if symptoms urgent.

## 2016-04-02 NOTE — Progress Notes (Signed)
Pre visit review using our clinic review tool, if applicable. No additional management support is needed unless otherwise documented below in the visit note. 

## 2016-04-02 NOTE — Assessment & Plan Note (Signed)
Patient is up-to-date on colonoscopy and mammogram. She is also up-to-date on her Pap smear and has no history of abnormal Pap smears. She DEXA scan done this year which is stable. Immunizations are up-to-date. Screening labs to be done today including HIV and hepatitis C. Encouraged starting an exercise regimen.

## 2016-04-02 NOTE — Assessment & Plan Note (Signed)
Following with urology. Pending cytoscope.

## 2016-04-03 ENCOUNTER — Encounter: Payer: Self-pay | Admitting: Family

## 2016-04-03 LAB — HIV ANTIBODY (ROUTINE TESTING W REFLEX): HIV: NONREACTIVE

## 2016-04-03 LAB — HEPATITIS C ANTIBODY: HCV AB: NEGATIVE

## 2016-04-04 ENCOUNTER — Other Ambulatory Visit: Payer: Self-pay | Admitting: Family

## 2016-04-04 DIAGNOSIS — E782 Mixed hyperlipidemia: Secondary | ICD-10-CM

## 2016-04-04 MED ORDER — SIMVASTATIN 20 MG PO TABS: 20.0000 mg | ORAL_TABLET | Freq: Every evening | ORAL | 3 refills | Status: DC

## 2016-04-16 ENCOUNTER — Ambulatory Visit (INDEPENDENT_AMBULATORY_CARE_PROVIDER_SITE_OTHER): Payer: Medicare Other | Admitting: Urology

## 2016-04-16 VITALS — BP 135/82 | HR 76 | Ht 72.0 in | Wt 176.0 lb

## 2016-04-16 DIAGNOSIS — R3129 Other microscopic hematuria: Secondary | ICD-10-CM | POA: Diagnosis not present

## 2016-04-16 LAB — URINALYSIS, COMPLETE
Bilirubin, UA: NEGATIVE
GLUCOSE, UA: NEGATIVE
Ketones, UA: NEGATIVE
LEUKOCYTES UA: NEGATIVE
Nitrite, UA: NEGATIVE
PROTEIN UA: NEGATIVE
RBC, UA: NEGATIVE
Specific Gravity, UA: 1.015 (ref 1.005–1.030)
Urobilinogen, Ur: 0.2 mg/dL (ref 0.2–1.0)
pH, UA: 7 (ref 5.0–7.5)

## 2016-04-16 LAB — MICROSCOPIC EXAMINATION: Bacteria, UA: NONE SEEN

## 2016-04-16 MED ORDER — DOXYCYCLINE HYCLATE 100 MG PO TABS
100.0000 mg | ORAL_TABLET | Freq: Once | ORAL | Status: AC
  Administered 2016-04-16: 100 mg via ORAL

## 2016-04-16 MED ORDER — DOXYCYCLINE HYCLATE 100 MG PO TABS: 100.0000 mg | ORAL_TABLET | Freq: Two times a day (BID) | ORAL | Status: DC

## 2016-04-16 MED ORDER — LIDOCAINE HCL 2 % EX GEL
1.0000 "application " | Freq: Once | CUTANEOUS | Status: AC
  Administered 2016-04-16: 1 via URETHRAL

## 2016-04-16 NOTE — Progress Notes (Signed)
04/16/2016 9:43 AM   Deborah Mcconnell 1950/07/21 [id number]  Referring provider: Burnard Hawthorne, FNP [address] [place], [place] [address]  Chief Complaint  Patient presents with  . Cysto    Ctscan results    HPI: 65 yo referred for further evaluation of microscopic hematuria Who returns today for follow-up renal ultrasound and cystoscopy.  On several occasions, she is developed episodes of dysuria, lower abdominal and pelvic pain, and urinary frequency which she felt was most likely an infection.  Point-of-care urinalyses at the time mostly appear unremarkable other than occasional trace or small leukocytes. Urine culture sent associated with these episodes are negative and she is been told to stop her antibiotics each time.  This has happened 2-3x over the past year.  No recurrent episodes of dysuria since her last visit.  Most recent urinalysis on 03/11/2016 shows 3- 6 red blood cells per high-powered field with an otherwise fairly unremarkable UA.  Repeat UA in our office today shows persistent microscopic hematuria with 3-10 red blood cells per high-powered field, otherwise negative.  She does have one documented urinary tract infection in 08/2015 at which time she grew Escherichia coli.  She is postmenopausal. She denies any dyspareunia but does have some vaginal dryness. No symptoms of pelvic organ prolapse or incontinence.    She tries to drink plenty of water.  She does have a personal history of breast cancer currently on letrizole.  She is a never smoker.  Most recent cross-sectional imaging on 01/2013 CT abdomen and pelvis with contrast shows no GU abnormalities other than an likely artifactual right renal attenuation. No stones. She denies a personal history of kidney stones.  RUS unremarkable.     PMH: Past Medical History:  Diagnosis Date  . Cancer ([place])   . Hyperlipidemia   . Osteopenia    previously taking Evista and Actonel  .  Osteoporosis   . Vertigo     Surgical History: Past Surgical History:  Procedure Laterality Date  . CHOLECYSTECTOMY    . lt breast lumpectomy      Home Medications:    Medication List       Accurate as of 04/16/16  9:43 AM. Always use your most recent med list.          CALCIUM 600 600 MG Tabs tablet Generic drug:  calcium carbonate Take by mouth. Take 1,200 mg by mouth.   cholecalciferol 1000 units tablet Commonly known as:  VITAMIN D Take 1,000 Units by mouth daily.   diclofenac sodium 1 % Gel Commonly known as:  VOLTAREN Apply 4 g topically 4 (four) times daily.   ibuprofen 200 MG tablet Commonly known as:  ADVIL,MOTRIN Take 600 mg by mouth 2 (two) times daily. prn   letrozole 2.5 MG tablet Commonly known as:  FEMARA TAKE ONE TABLET BY MOUTH ONCE DAILY   meclizine 25 MG tablet Commonly known as:  ANTIVERT Take 1 tablet (25 mg total) by mouth 3 (three) times daily as needed.   naproxen sodium 220 MG tablet Commonly known as:  ANAPROX Take by mouth.   simvastatin 20 MG tablet Commonly known as:  ZOCOR Take 1 tablet (20 mg total) by mouth every evening.       Allergies:  Allergies  Allergen Reactions  . Ciprofloxacin Anaphylaxis    Causes death  . Ofloxacin Anaphylaxis    Causes Death  . Quinolones Anaphylaxis  . Actonel [Risedronate]     Stomach pain  . Augmentin [  Amoxicillin-Pot Clavulanate] Nausea And Vomiting    Family History: Family History  Problem Relation Age of Onset  . Osteoporosis Mother   . Hypertension Mother   . Osteoarthritis Mother   . Osteoporosis Sister   . Osteoarthritis Sister   . Osteoarthritis Maternal Grandmother   . Diabetes Maternal Grandfather   . Cancer Paternal Grandfather     unknown  . Prostate cancer Neg Hx   . Bladder Cancer Neg Hx   . Kidney cancer Neg Hx     Social History:  reports that she has never smoked. She has never used smokeless tobacco. She reports that she drinks alcohol. She reports  that she does not use drugs.   Physical Exam: BP 135/82   Pulse 76   Ht 6' (1.829 m)   Wt 176 lb (79.8 kg)   BMI 23.87 kg/m   Constitutional:  Alert and oriented, No acute distress. HEENT: [place] AT, moist mucus membranes.  Trachea midline, no masses. Cardiovascular: No clubbing, cyanosis, or edema. Respiratory: Normal respiratory effort, no increased work of breathing. GI: Abdomen is soft, nontender, nondistended, no abdominal masses GU:  Normal urethral meatus with atrophic vaginal tissue surrounding.  Anterior vaginal wall varices appreciated. Normal external genitalia. Mild stage I cystocele with Valsalva, no demonstrable stress urinary incontinence. No apical descent or rectocele appreciated. Skin: No rashes, bruises or suspicious lesions. Neurologic: Grossly intact, no focal deficits, moving all 4 extremities. Psychiatric: Normal mood and affect.  Laboratory Data: Lab Results  Component Value Date   WBC 4.9 04/02/2016   HGB 14.8 04/02/2016   HCT 43.3 04/02/2016   MCV 94.2 04/02/2016   PLT 273.0 04/02/2016    Lab Results  Component Value Date   CREATININE 0.74 04/02/2016   Urinalysis UA reviewed, see EPI  Pertinent Imaging: CLINICAL DATA:  Microscopic hematuria.  EXAM: RENAL / URINARY TRACT ULTRASOUND COMPLETE  COMPARISON:  CT scan of February 25, 2013.  FINDINGS: Right Kidney:  Length: 12.3 cm. Parapelvic cysts are noted with the largest measuring 1.6 cm. Echogenicity within normal limits. No mass or hydronephrosis visualized.  Left Kidney:  Length: 11.5 cm. Parapelvic cysts are noted with the largest measuring 2.1 cm. Echogenicity within normal limits. No mass or hydronephrosis visualized.  Bladder:  Appears normal for degree of bladder distention. Calculated prevoid volume of 595 mL. Calculated postvoid volume of 20 mL.  IMPRESSION: Bilateral parapelvic cysts. No other significant abnormality seen in the kidneys.   Electronically  Signed   By: Marijo Conception, M.D.   On: 03/19/2016 16:48  Renal ultrasound personally reviewed.   Cystoscopy Procedure Note  Patient identification was confirmed, informed consent was obtained, and patient was prepped using Betadine solution.  Lidocaine jelly was administered per urethral meatus.    Preoperative abx where received prior to procedure.    Procedure: - Flexible cystoscope introduced, without any difficulty.   - Thorough search of the bladder revealed:    normal urethral meatus    normal urothelium    no stones    no ulcers     no tumors    no urethral polyps    no trabeculation  - Ureteral orifices were normal in position and appearance.  Post-Procedure: - Patient tolerated the procedure well  Assessment/ Plan:    [name], MD   Assessment & Plan:    1. Microscopic hematuria Status post negative workup including renal ultrasound (previous cross-sectional imaging with delayed 3 years ago normal, low risk, nonsmoker) and negative cystoscopy today -  Urinalysis, Complete - US Renal; Future  2. Dysuria Recurrent episodes of pelvic pain, dysuria with no documented infections associated with these episodes. Encouraged adequate hydration. I also explained that there are other etiologies for pelvic pain/dysuria.    Recommend azo as needed.  If dysuria fails to resolve after 24 hours, advised to stop her office for nurse/lap UA/urine culture and we'll treat as needed.  3. Atrophic vaginitis May be contributing to episodes of self-limited dysuria, not a candidate for estrogen cream, currently on Letrizole  No Follow-up on file.  [name], MD  [facility] [address] [place], [place] [address] [phone]

## 2016-05-02 ENCOUNTER — Encounter: Payer: Self-pay | Admitting: Urology

## 2016-05-08 ENCOUNTER — Ambulatory Visit: Payer: Medicare Other

## 2016-05-08 ENCOUNTER — Other Ambulatory Visit: Payer: Self-pay | Admitting: Urology

## 2016-05-08 VITALS — BP 133/85 | HR 83 | Ht 72.0 in | Wt 174.9 lb

## 2016-05-08 DIAGNOSIS — R3129 Other microscopic hematuria: Secondary | ICD-10-CM | POA: Diagnosis not present

## 2016-05-08 LAB — URINALYSIS, COMPLETE
BILIRUBIN UA: NEGATIVE
Glucose, UA: NEGATIVE
Ketones, UA: NEGATIVE
Nitrite, UA: NEGATIVE
PH UA: 6 (ref 5.0–7.5)
PROTEIN UA: NEGATIVE
Specific Gravity, UA: 1.015 (ref 1.005–1.030)
Urobilinogen, Ur: 0.2 mg/dL (ref 0.2–1.0)

## 2016-05-08 LAB — MICROSCOPIC EXAMINATION: WBC, UA: >30 /hpf — AB (ref 0–?)

## 2016-05-08 MED ORDER — SULFAMETHOXAZOLE-TRIMETHOPRIM 800-160 MG PO TABS: 1.0000 | ORAL_TABLET | Freq: Two times a day (BID) | ORAL | 0 refills | Status: DC

## 2016-05-08 NOTE — Progress Notes (Unsigned)
Please let the patient know that Bactrim was sent to [facility] on [address].

## 2016-05-08 NOTE — Progress Notes (Signed)
Pt presents with c/o urinary frequency and urgency, hard to postpone urination, dysuria, gross hematuria, and back pain. A clean catch urine was obtained for u/a and cx.   Blood pressure 133/85, pulse 83, height 6' (1.829 m), weight 174 lb 14.4 oz (79.3 kg).

## 2016-05-12 ENCOUNTER — Telehealth: Payer: Self-pay

## 2016-05-12 LAB — CULTURE, URINE COMPREHENSIVE

## 2016-05-12 NOTE — Telephone Encounter (Signed)
Spoke with pt in reference to ucx results and abx. Pt voiced understanding. 

## 2016-05-12 NOTE — Telephone Encounter (Signed)
-----   Message from [name], MD sent at 05/12/2016  7:23 AM EST ----- Please let her know she did grow E. colia and Bactrim was a good choice.    [name], MD

## 2016-06-20 ENCOUNTER — Encounter: Payer: Self-pay | Admitting: Family

## 2016-07-02 ENCOUNTER — Ambulatory Visit (INDEPENDENT_AMBULATORY_CARE_PROVIDER_SITE_OTHER): Payer: Medicare Other

## 2016-07-02 ENCOUNTER — Telehealth: Payer: Self-pay | Admitting: Urology

## 2016-07-02 VITALS — BP 130/76 | HR 82 | Ht 72.0 in | Wt 180.3 lb

## 2016-07-02 DIAGNOSIS — N390 Urinary tract infection, site not specified: Secondary | ICD-10-CM | POA: Diagnosis not present

## 2016-07-02 LAB — URINALYSIS, COMPLETE
BILIRUBIN UA: NEGATIVE
GLUCOSE, UA: NEGATIVE
KETONES UA: NEGATIVE
Nitrite, UA: NEGATIVE
PROTEIN UA: NEGATIVE
Specific Gravity, UA: <1.005 — ABNORMAL LOW (ref 1.005–1.030)
Urobilinogen, Ur: 0.2 mg/dL (ref 0.2–1.0)
pH, UA: 5.5 (ref 5.0–7.5)

## 2016-07-02 LAB — MICROSCOPIC EXAMINATION
Bacteria, UA: NONE SEEN
Epithelial Cells (non renal): NONE SEEN /hpf (ref 0–10)

## 2016-07-02 NOTE — Progress Notes (Signed)
Pt presented today with c/o urinary frequency and urgency, hard to postpone urination, dysuria and lower abd pain. A clean catch specimen was obtained for u/a and cx.   Blood pressure 130/76, pulse 82, height 6' (1.829 m), weight 180 lb 4.8 oz (81.8 kg).

## 2016-07-02 NOTE — Telephone Encounter (Signed)
Pt wants you to give her a call.  She thinks she has a UTI and wants to drop off a sample.

## 2016-07-06 LAB — CULTURE, URINE COMPREHENSIVE

## 2016-07-07 ENCOUNTER — Telehealth: Payer: Self-pay

## 2016-07-07 DIAGNOSIS — N390 Urinary tract infection, site not specified: Secondary | ICD-10-CM

## 2016-07-07 MED ORDER — SULFAMETHOXAZOLE-TRIMETHOPRIM 800-160 MG PO TABS: 1.0000 | ORAL_TABLET | Freq: Two times a day (BID) | ORAL | 0 refills | Status: DC

## 2016-07-07 NOTE — Telephone Encounter (Signed)
Spoke with pt in reference to +ucx. Made aware abx were sent to pharmacy. Pt voiced understanding.  

## 2016-07-07 NOTE — Telephone Encounter (Signed)
-----   Message from [name], MD sent at 07/07/2016  7:52 AM EST ----- This patient did grow E. Coli, lets treat with Bactrim DS bid x 7 days.    More importantly, she was supposed to come back for a cystoscopy (per my note in 02/2016), please have her schedule this and stress its importance.    [name], MD

## 2016-07-10 ENCOUNTER — Telehealth: Payer: Self-pay

## 2016-07-10 DIAGNOSIS — N390 Urinary tract infection, site not specified: Secondary | ICD-10-CM

## 2016-07-10 MED ORDER — NITROFURANTOIN MONOHYD MACRO 100 MG PO CAPS
100.0000 mg | ORAL_CAPSULE | Freq: Two times a day (BID) | ORAL | 0 refills | Status: AC
Start: 2016-07-10 — End: 2016-07-20

## 2016-07-10 NOTE — Telephone Encounter (Signed)
Spoke with pt in reference to macrobid. Pt voiced understanding. Bactrim was added to pt allergies with reactions noted.

## 2016-07-10 NOTE — Telephone Encounter (Signed)
LMOM-medication sent to pharmacy 

## 2016-07-10 NOTE — Telephone Encounter (Signed)
Switch to Macrobid 100 mg bid x 10 days.  [name], MD

## 2016-07-10 NOTE — Telephone Encounter (Signed)
Pt called stating she started bactrim on Monday for a UTI. Pt stated that she feels as though she is having an allergic reaction. She has developed hives, itching, and GI distress. Please advise.

## 2016-07-23 ENCOUNTER — Encounter: Payer: Self-pay | Admitting: Urology

## 2016-07-23 ENCOUNTER — Ambulatory Visit (INDEPENDENT_AMBULATORY_CARE_PROVIDER_SITE_OTHER): Payer: Medicare Other | Admitting: Urology

## 2016-07-23 VITALS — BP 125/80 | HR 78 | Ht 72.0 in | Wt 177.1 lb

## 2016-07-23 DIAGNOSIS — N952 Postmenopausal atrophic vaginitis: Secondary | ICD-10-CM | POA: Diagnosis not present

## 2016-07-23 DIAGNOSIS — N390 Urinary tract infection, site not specified: Secondary | ICD-10-CM | POA: Diagnosis not present

## 2016-07-23 DIAGNOSIS — R3129 Other microscopic hematuria: Secondary | ICD-10-CM | POA: Diagnosis not present

## 2016-07-23 LAB — URINALYSIS, COMPLETE
Bilirubin, UA: NEGATIVE
Glucose, UA: NEGATIVE
Ketones, UA: NEGATIVE
LEUKOCYTES UA: NEGATIVE
NITRITE UA: NEGATIVE
Protein, UA: NEGATIVE
RBC, UA: NEGATIVE
Specific Gravity, UA: 1.01 (ref 1.005–1.030)
Urobilinogen, Ur: 0.2 mg/dL (ref 0.2–1.0)
pH, UA: 5.5 (ref 5.0–7.5)

## 2016-07-23 LAB — MICROSCOPIC EXAMINATION: BACTERIA UA: NONE SEEN

## 2016-07-23 NOTE — Progress Notes (Signed)
07/23/2016 3:08 PM   Deborah Mcconnell Jun 10, 1951 [id number]  Referring provider: Burnard Hawthorne, FNP [address] [place], [place] [address]  Chief Complaint  Patient presents with  . Recurrent UTI    HPI: Patient is a 66 -year-old Caucasian female who is presents today for further evaluation for recurrent UTI's.   Patient states that she has had 3 urinary tract infections over the last year.  Her symptoms with a urinary tract infection consist of dysuria and urgency.  She denies suprapubic pain, back pain, abdominal pain or flank pain.  She has not had any recent fevers, chills, nausea or vomiting.   She does not have a history of nephrolithiasis, GU surgery or GU trauma.     Reviewing her records,  she has had two documented UTI's with E. Coli.      She is sexually active.  She has noted a correlation with her urinary tract infections and sexual intercourse.  She is post menopausal.   She does not engage in anal sex.   She is voiding before and after sex.     She denies constipation and/or diarrhea.   She does engage in good perineal hygiene. She does not take tub baths.   She does not have incontinence.  She is not having pain with bladder filling.    She had a renal ultrasound on 03/19/2016 noted bilateral parapelvic cysts. No other significant abnormality seen in the kidneys.  I have independently reviewed the films.  Cystoscopy performed on 04/16/2016 was negative.    She is drinking 1 glass of water daily.  One cup of coffee.    PMH: Past Medical History:  Diagnosis Date  . Cancer ([place])   . Hyperlipidemia   . Osteopenia    previously taking Evista and Actonel  . Osteoporosis   . Vertigo     Surgical History: Past Surgical History:  Procedure Laterality Date  . CHOLECYSTECTOMY    . lt breast lumpectomy      Home Medications:  Allergies as of 07/23/2016      Reactions   Ciprofloxacin Anaphylaxis   Causes death   Ofloxacin  Anaphylaxis   Causes Death   Quinolones Anaphylaxis   Actonel [risedronate]    Stomach pain   Augmentin [amoxicillin-pot Clavulanate] Nausea And Vomiting   Bactrim [sulfamethoxazole-trimethoprim] Hives   Itching, hives, and GI distress      Medication List       Accurate as of 07/23/16  3:08 PM. Always use your most recent med list.          CALCIUM 600 600 MG Tabs tablet Generic drug:  calcium carbonate Take by mouth. Take 1,200 mg by mouth.   cholecalciferol 1000 units tablet Commonly known as:  VITAMIN D Take 1,000 Units by mouth daily.   diclofenac sodium 1 % Gel Commonly known as:  VOLTAREN Apply 4 g topically 4 (four) times daily.   ibuprofen 200 MG tablet Commonly known as:  ADVIL,MOTRIN Take 600 mg by mouth 2 (two) times daily. prn   letrozole 2.5 MG tablet Commonly known as:  FEMARA TAKE ONE TABLET BY MOUTH ONCE DAILY   meclizine 25 MG tablet Commonly known as:  ANTIVERT Take 1 tablet (25 mg total) by mouth 3 (three) times daily as needed.   naproxen sodium 220 MG tablet Commonly known as:  ANAPROX Take by mouth.   simvastatin 20 MG tablet Commonly known as:  ZOCOR Take 1 tablet (20 mg total) by  mouth every evening.   sulfamethoxazole-trimethoprim 800-160 MG tablet Commonly known as:  BACTRIM DS,SEPTRA DS Take 1 tablet by mouth every 12 (twelve) hours.   SUPER B COMPLEX/C Caps Take by mouth.   vitamin C 1000 MG tablet Take 1,000 mg by mouth daily.       Allergies:  Allergies  Allergen Reactions  . Ciprofloxacin Anaphylaxis    Causes death  . Ofloxacin Anaphylaxis    Causes Death  . Quinolones Anaphylaxis  . Actonel [Risedronate]     Stomach pain  . Augmentin [Amoxicillin-Pot Clavulanate] Nausea And Vomiting  . Bactrim [Sulfamethoxazole-Trimethoprim] Hives    Itching, hives, and GI distress    Family History: Family History  Problem Relation Age of Onset  . Osteoporosis Mother   . Hypertension Mother   . Osteoarthritis Mother     . Osteoporosis Sister   . Osteoarthritis Sister   . Osteoarthritis Maternal Grandmother   . Diabetes Maternal Grandfather   . Cancer Paternal Grandfather     unknown  . Prostate cancer Neg Hx   . Bladder Cancer Neg Hx   . Kidney cancer Neg Hx     Social History:  reports that she has never smoked. She has never used smokeless tobacco. She reports that she drinks alcohol. She reports that she does not use drugs.  ROS: UROLOGY Frequent Urination?: No Hard to postpone urination?: No Burning/pain with urination?: No Get up at night to urinate?: No Leakage of urine?: No Urine stream starts and stops?: No Trouble starting stream?: No Do you have to strain to urinate?: No Blood in urine?: No Urinary tract infection?: No Sexually transmitted disease?: No Injury to kidneys or bladder?: No Painful intercourse?: No Weak stream?: No Currently pregnant?: No Vaginal bleeding?: No Last menstrual period?: n  Gastrointestinal Nausea?: No Vomiting?: No Indigestion/heartburn?: No Diarrhea?: No Constipation?: No  Constitutional Fever: No Night sweats?: No Weight loss?: No Fatigue?: No  Skin Skin rash/lesions?: No Itching?: No  Eyes Blurred vision?: No Double vision?: No  Ears/Nose/Throat Sore throat?: No Sinus problems?: No  Hematologic/Lymphatic Swollen glands?: No Easy bruising?: No  Cardiovascular Leg swelling?: No Chest pain?: No  Respiratory Cough?: No Shortness of breath?: No  Endocrine Excessive thirst?: No  Musculoskeletal Back pain?: No Joint pain?: No  Neurological Headaches?: No Dizziness?: No  Psychologic Depression?: No Anxiety?: No  Physical Exam: BP 125/80 (BP Location: Right Arm, Patient Position: Sitting, Cuff Size: Normal)   Pulse 78   Ht 6' (1.829 m)   Wt 177 lb 1.6 oz (80.3 kg)   BMI 24.02 kg/m   Constitutional: Well nourished. Alert and oriented, No acute distress. HEENT: [place] AT, moist mucus membranes. Trachea midline,  no masses. Cardiovascular: No clubbing, cyanosis, or edema. Respiratory: Normal respiratory effort, no increased work of breathing. GI: Abdomen is soft, non tender, non distended, no abdominal masses. Liver and spleen not palpable.  No hernias appreciated.  Stool sample for occult testing is not indicated.   GU: No CVA tenderness.  No bladder fullness or masses.   Skin: No rashes, bruises or suspicious lesions. Lymph: No cervical or inguinal adenopathy. Neurologic: Grossly intact, no focal deficits, moving all 4 extremities. Psychiatric: Normal mood and affect.  Laboratory Data: Lab Results  Component Value Date   WBC 4.9 04/02/2016   HGB 14.8 04/02/2016   HCT 43.3 04/02/2016   MCV 94.2 04/02/2016   PLT 273.0 04/02/2016    Lab Results  Component Value Date   CREATININE 0.74 04/02/2016  Lab Results  Component Value Date   HGBA1C 5.3 04/02/2016    Lab Results  Component Value Date   TSH 1.87 04/02/2016       Component Value Date/Time   CHOL 229 (H) 04/02/2016 1039   HDL 64.90 04/02/2016 1039   CHOLHDL 4 04/02/2016 1039   VLDL 13.8 04/02/2016 1039   LDLCALC 150 (H) 04/02/2016 1039    Lab Results  Component Value Date   AST 18 04/02/2016   Lab Results  Component Value Date   ALT 12 04/02/2016     Urinalysis Unremarkable.  See EPIC.    Assessment & Plan:    1. Recurrent UTI's  - Patient is instructed to increase her water intake until the urine is pale yellow or clear.  I have advised her to take probiotics (yogurt, oral pills or vaginal suppositories), take cranberry pills or drink the juice and use the estrogen cream.  She is to take Vitamin C 1,000 mg daily to acidify the urine.  She should also avoid soaking in tubs and wipe front to back after urinating.  She may benefit from core strengthening exercises.  We can refer her to PT if she desires.    - Because of her history of recurrent UTIs, I have asked the patient to contact our office if she should  experience symptoms of urinary tract infection so that we can CATH her for an urine specimen for urinalysis and culture. This is to prevent a skin contaminant from showing up in the urine culture.  If she should have her symptoms after hours or cannot get to our office, she should notify her other providers that she needs a catheterized specimen for UA and culture.   - I reviewed the symptoms of a urinary tract infection, such as a worsening of urinary urgency and frequency, dysuria, which is painful urination and not the pain of urine hitting sensitive perineal skin, hematuria, foul-smelling urine, suprapubic pain or mental status changes. Fevers, chills, nausea and or vomiting can also be signs of a possible UTI.  Positive urinalyses and positive urine cultures that are not associated with urinary symptoms should not be treated with antibiotics.    - I explained to the patient that being exposed to unnecessary antibiotics can put her at risk for increasing resistance of the bacteria to antibiotics, C. difficile and the side effects of the antibiotics.                           - Urinalysis, Complete  2. Vaginal atrophy  - not a candidate for vaginal estrogen cream due to breast cancer  3. Microscopic hematuria Status post negative workup including renal ultrasound (previous cross-sectional imaging with delayed 3 years ago normal, low risk, nonsmoker) and negative cystoscopy  - Urinalysis, Complete no blood today  Return in about 3 months (around 10/21/2016) for PVR and OAB questionnaire.  These notes generated with voice recognition software. I apologize for typographical errors.  Zara Council, [facility] [address] [place], [place] [address] [phone]

## 2016-07-23 NOTE — Patient Instructions (Signed)

## 2016-07-29 ENCOUNTER — Encounter: Payer: Self-pay | Admitting: Family

## 2016-07-29 ENCOUNTER — Ambulatory Visit (INDEPENDENT_AMBULATORY_CARE_PROVIDER_SITE_OTHER): Payer: Medicare Other

## 2016-07-29 ENCOUNTER — Ambulatory Visit (INDEPENDENT_AMBULATORY_CARE_PROVIDER_SITE_OTHER): Payer: Medicare Other | Admitting: Family

## 2016-07-29 VITALS — BP 118/76 | HR 79 | Temp 97.5°F | Ht 71.0 in | Wt 179.6 lb

## 2016-07-29 DIAGNOSIS — M25551 Pain in right hip: Secondary | ICD-10-CM | POA: Insufficient documentation

## 2016-07-29 MED ORDER — PREDNISONE 10 MG PO TABS: ORAL_TABLET | ORAL | 0 refills | Status: DC

## 2016-07-29 NOTE — Assessment & Plan Note (Addendum)
New. Symptoms most consistent with trochanteric bursitis with associated SI joint dysfunction. Trial of prednisone. Xray hip. Referral to physical therapy. Home exercises given to patient. Will follow

## 2016-07-29 NOTE — Progress Notes (Signed)
Subjective:    Patient ID: Deborah Mcconnell, female    DOB: 10-16-1950, 66 y.o.   MRN: [id number]  CC: Deborah Mcconnell is a 66 y.o. female who presents today for an acute visit.    HPI: CC: right hip pain, radiates to low back and down right leg x 5 months, unchanged.   No injuryWorse when walking up steps or sittng for period.  cannot lay on right side. Relieved with exercise however some days overdoes it and has right hip pain. No saddle anesthesia, urinary incontinentine. Aleve helps.   H/o breast cancer     Follows with Dr Gale Journey endocrine for osteopenia. On aromatase inhibitor. Stable BMD. Plans to start prolia 02/2016.   HISTORY:  Past Medical History:  Diagnosis Date  . Cancer ([place])   . Hyperlipidemia   . Osteopenia    previously taking Evista and Actonel  . Osteoporosis   . Vertigo    Past Surgical History:  Procedure Laterality Date  . CHOLECYSTECTOMY    . lt breast lumpectomy     Family History  Problem Relation Age of Onset  . Osteoporosis Mother   . Hypertension Mother   . Osteoarthritis Mother   . Osteoporosis Sister   . Osteoarthritis Sister   . Osteoarthritis Maternal Grandmother   . Diabetes Maternal Grandfather   . Cancer Paternal Grandfather     unknown  . Prostate cancer Neg Hx   . Bladder Cancer Neg Hx   . Kidney cancer Neg Hx     Allergies: Ciprofloxacin; Ofloxacin; Quinolones; Actonel [risedronate]; Augmentin [amoxicillin-pot clavulanate]; and Bactrim [sulfamethoxazole-trimethoprim] Current Outpatient Prescriptions on File Prior to Visit  Medication Sig Dispense Refill  . Ascorbic Acid (VITAMIN C) 1000 MG tablet Take 1,000 mg by mouth daily.    . calcium carbonate (CALCIUM 600) 600 MG TABS tablet Take by mouth. Take 1,200 mg by mouth.    . cholecalciferol (VITAMIN D) 1000 UNITS tablet Take 1,000 Units by mouth daily.    . diclofenac sodium (VOLTAREN) 1 % GEL Apply 4 g topically 4 (four) times daily. 1 Tube 3  . ibuprofen  (ADVIL,MOTRIN) 200 MG tablet Take 600 mg by mouth 2 (two) times daily. prn    . letrozole (FEMARA) 2.5 MG tablet TAKE ONE TABLET BY MOUTH ONCE DAILY 30 tablet 0  . meclizine (ANTIVERT) 25 MG tablet Take 1 tablet (25 mg total) by mouth 3 (three) times daily as needed. 90 tablet 6  . naproxen sodium (ANAPROX) 220 MG tablet Take by mouth.    . simvastatin (ZOCOR) 20 MG tablet Take 1 tablet (20 mg total) by mouth every evening. 90 tablet 3  . SUPER B COMPLEX/C CAPS Take by mouth.     No current facility-administered medications on file prior to visit.     Social History  Substance Use Topics  . Smoking status: Never Smoker  . Smokeless tobacco: Never Used  . Alcohol use 0.0 oz/week     Comment: rare    Review of Systems  Constitutional: Negative for chills and fever.  Respiratory: Negative for cough.   Cardiovascular: Negative for chest pain and palpitations.  Gastrointestinal: Negative for nausea and vomiting.  Musculoskeletal: Negative for back pain.  Neurological: Negative for numbness.      Objective:    BP 118/76   Pulse 79   Temp 97.5 F (36.4 C) (Oral)   Ht 5\' 11"  (1.803 m)   Wt 179 lb 9.6 oz (81.5 kg)   SpO2 98%  BMI 25.05 kg/m    Physical Exam  Constitutional: She appears well-developed and well-nourished.  Eyes: Conjunctivae are normal.  Cardiovascular: Normal rate, regular rhythm, normal heart sounds and normal pulses.   Pulmonary/Chest: Effort normal and breath sounds normal. She has no wheezes. She has no rhonchi. She has no rales.  Musculoskeletal:       Right hip: She exhibits tenderness. She exhibits normal range of motion, normal strength and no bony tenderness.       Lumbar back: She exhibits normal range of motion, no tenderness, no bony tenderness, no swelling, no edema, no pain and no spasm.  Full range of motion with flexion, tension, lateral side bends. No bony tenderness. No pain, numbness, tingling elicited with single leg raise bilaterally.    Right Hip: Slight limp. No waddling gait. Full ROM with flexion and hip rotation in flexion.  Pain of lateral hip with  (flexion-abduction-external rotation) test. Pain with deep palpation of greater trochanter. Pain with palpation of SI joint.    Neurological: She is alert. She has normal strength. No sensory deficit.  Reflex Scores:      Patellar reflexes are 2+ on the right side and 2+ on the left side. Sensation and strength intact bilateral lower extremities.  Skin: Skin is warm and dry.  Psychiatric: She has a normal mood and affect. Her speech is normal and behavior is normal. Thought content normal.  Vitals reviewed.      Assessment & Plan:   Problem List Items Addressed This Visit      Other   Right hip pain - Primary    New. Symptoms most consistent with trochanteric bursitis with associated SI joint dysfunction. Trial of prednisone. Xray hip. Referral to physical therapy. Home exercises given to patient. Will follow      Relevant Medications   predniSONE (DELTASONE) 10 MG tablet   Other Relevant Orders   Ambulatory referral to Physical Therapy   DG HIP UNILAT WITH PELVIS MIN 4 VIEWS RIGHT (Completed)        I have discontinued Ms. Musick's sulfamethoxazole-trimethoprim. I am also having her start on predniSONE. Additionally, I am having her maintain her cholecalciferol, ibuprofen, meclizine, calcium carbonate, letrozole, naproxen sodium, diclofenac sodium, simvastatin, SUPER B COMPLEX/C, and vitamin C.   Meds ordered this encounter  Medications  . predniSONE (DELTASONE) 10 MG tablet    Sig: Take 4 tablets ( total 40 mg) by mouth for 2 days; take 3 tablets ( total 30 mg) by mouth for 2 days; take 2 tablets ( total 20 mg) by mouth for 1 day; take 1 tablet ( total 10 mg) by mouth for 1 day.    Dispense:  17 tablet    Refill:  0    Order Specific Question:   Supervising Provider    Answer:   Crecencio Mc [[id number]]    Return precautions given.   Risks,  benefits, and alternatives of the medications and treatment plan prescribed today were discussed, and patient expressed understanding.   Education regarding symptom management and diagnosis given to patient on AVS.  Continue to follow with Mable Paris, FNP for routine health maintenance.   Deborah Mcconnell and I agreed with plan.   Mable Paris, FNP

## 2016-07-29 NOTE — Progress Notes (Signed)
Pre visit review using our clinic review tool, if applicable. No additional management support is needed unless otherwise documented below in the visit note. 

## 2016-07-29 NOTE — Patient Instructions (Addendum)
Referral to PT  Xray of hip  Trial of prednisone.    Trochanteric Bursitis Rehab Ask your health care provider which exercises are safe for you. Do exercises exactly as told by your health care provider and adjust them as directed. It is normal to feel mild stretching, pulling, tightness, or discomfort as you do these exercises, but you should stop right away if you feel sudden pain or your pain gets worse.Do not begin these exercises until told by your health care provider. Stretching exercises These exercises warm up your muscles and joints and improve the movement and flexibility of your hip. These exercises also help to relieve pain and stiffness. Exercise A: Iliotibial band stretch 1. Lie on your side with your left / right leg in the top position. 2. Bend your left / right knee and grab your ankle. 3. Slowly bring your knee back so your thigh is behind your body. 4. Slowly lower your knee toward the floor until you feel a gentle stretch on the outside of your left / right thigh. If you do not feel a stretch and your knee will not fall farther, place the heel of your other foot on top of your outer knee and pull your thigh down farther. 5. Hold this position for __________ seconds. 6. Slowly return to the starting position. Repeat __________ times. Complete this exercise __________ times a day. Strengthening exercises These exercises build strength and endurance in your hip and pelvis. Endurance is the ability to use your muscles for a long time, even after they get tired. Exercise B: Bridge (hip extensors) 1. Lie on your back on a firm surface with your knees bent and your feet flat on the floor. 2. Tighten your buttocks muscles and lift your buttocks off the floor until your trunk is level with your thighs. You should feel the muscles working in your buttocks and the back of your thighs. If this exercise is too easy, try doing it with your arms crossed over your chest. 3. Hold this  position for __________ seconds. 4. Slowly return to the starting position. 5. Let your muscles relax completely between repetitions. Repeat __________ times. Complete this exercise __________ times a day. Exercise C: Squats (knee extensors and  quadriceps) 1. Stand in front of a table, with your feet and knees pointing straight ahead. You may rest your hands on the table for balance but not for support. 2. Slowly bend your knees and lower your hips like you are going to sit in a chair.  Keep your weight over your heels, not over your toes.  Keep your lower legs upright so they are parallel with the table legs.  Do not let your hips go lower than your knees.  Do not bend lower than told by your health care provider.  If your hip pain increases, do not bend as low. 3. Hold this position for __________ seconds. 4. Slowly push with your legs to return to standing. Do not use your hands to pull yourself to standing. Repeat __________ times. Complete this exercise __________ times a day. Exercise D: Hip hike 1. Stand sideways on a bottom step. Stand on your left / right leg with your other foot unsupported next to the step. You can hold onto the railing or wall if needed for balance. 2. Keeping your knees straight and your torso square, lift your left / right hip up toward the ceiling. 3. Hold this position for __________ seconds. 4. Slowly let your left / right  hip lower toward the floor, past the starting position. Your foot should get closer to the floor. Do not lean or bend your knees. Repeat __________ times. Complete this exercise __________ times a day. Exercise E: Single leg stand 1. Stand near a counter or door frame that you can hold onto for balance as needed. It is helpful to stand in front of a mirror for this exercise so you can watch your hip. 2. Squeeze your left / right buttock muscles then lift up your other foot. Do not let your left / right hip push out to the  side. 3. Hold this position for __________ seconds. Repeat __________ times. Complete this exercise __________ times a day. This information is not intended to replace advice given to you by your health care provider. Make sure you discuss any questions you have with your health care provider. Document Released: 07/24/2004 Document Revised: 02/21/2016 Document Reviewed: 06/01/2015 Elsevier Interactive Patient Education  2017 [employer]. Sacroiliac Joint Dysfunction Introduction Sacroiliac joint dysfunction is a condition that causes inflammation on one or both sides of the sacroiliac (SI) joint. The SI joint connects the lower part of the spine (sacrum) with the two upper portions of the pelvis (ilium). This condition causes deep aching or burning pain in the low back. In some cases, the pain may also spread into one or both buttocks or hips or spread down the legs. What are the causes? This condition may be caused by:  Pregnancy. During pregnancy, extra stress is put on the SI joints because the pelvis widens.  Injury, such as:  Car accidents.  Sport-related injuries.  Work-related injuries.  Having one leg that is shorter than the other.  Conditions that affect the joints, such as:  Rheumatoid arthritis.  Gout.  Psoriatic arthritis.  Joint infection (septic arthritis). Sometimes, the cause of SI joint dysfunction is not known. What are the signs or symptoms? Symptoms of this condition include:  Aching or burning pain in the lower back. The pain may also spread to other areas, such as:  Buttocks.  Groin.  Thighs and legs.  Muscle spasms in or around the painful areas.  Increased pain when standing, walking, running, stair climbing, bending, or lifting. How is this diagnosed? Your health care provider will do a physical exam and take your medical history. During the exam, the health care provider may move one or both of your legs to different positions to check for  pain. Various tests may be done to help verify the diagnosis, including:  Imaging tests to look for other causes of pain. These may include:  MRI.  CT scan.  Bone scan.  Diagnostic injection. A numbing medicine is injected into the SI joint using a needle. If the pain is temporarily improved or stopped after the injection, this can indicate that SI joint dysfunction is the problem. How is this treated? Treatment may vary depending on the cause and severity of your condition. Treatment options may include:  Applying ice or heat to the lower back area. This can help to reduce pain and muscle spasms.  Medicines to relieve pain or inflammation or to relax the muscles.  Wearing a back brace (sacroiliac brace) to help support the joint while your back is healing.  Physical therapy to increase muscle strength around the joint and flexibility at the joint. This may also involve learning proper body positions and ways of moving to relieve stress on the joint.  Direct manipulation of the SI joint.  Injections of  steroid medicine into the joint in order to reduce pain and swelling.  Radiofrequency ablation to burn away nerves that are carrying pain messages from the joint.  Use of a device that provides electrical stimulation in order to reduce pain at the joint.  Surgery to put in screws and plates that limit or prevent joint motion. This is rare. Follow these instructions at home:  Rest as needed. Limit your activities as directed by your health care provider.  Take medicines only as directed by your health care provider.  If directed, apply ice to the affected area:  Put ice in a plastic bag.  Place a towel between your skin and the bag.  Leave the ice on for 20 minutes, 2-3 times per day.  Use a heating pad or a moist heat pack as directed by your health care provider.  Exercise as directed by your health care provider or physical therapist.  Keep all follow-up visits as  directed by your health care provider. This is important. Contact a health care provider if:  Your pain is not controlled with medicine.  You have a fever.  You have increasingly severe pain. Get help right away if:  You have weakness, numbness, or tingling in your legs or feet.  You lose control of your bladder or bowel. This information is not intended to replace advice given to you by your health care provider. Make sure you discuss any questions you have with your health care provider. Document Released: 09/12/2008 Document Revised: 11/22/2015 Document Reviewed: 02/21/2014  2017 Elsevier

## 2016-08-04 ENCOUNTER — Ambulatory Visit: Payer: Medicare Other | Attending: Family

## 2016-08-04 DIAGNOSIS — M25551 Pain in right hip: Secondary | ICD-10-CM

## 2016-08-04 DIAGNOSIS — M6281 Muscle weakness (generalized): Secondary | ICD-10-CM | POA: Diagnosis not present

## 2016-08-04 NOTE — Therapy (Signed)
[facility] [address] [place], [address] Phone: [phone]   Fax:  [phone]  Physical Therapy Evaluation 66  Patient Details  Name: Deborah Mcconnell MRN: [id number] Date of Birth: 66/19/1952 Referring Provider: Burnard Hawthorne, FNP  Encounter Date: 66/10/2016      PT End of Session - 08/04/16 1434    Visit Number 1   Number of Visits 9   Date for PT Re-Evaluation 09/04/16   Authorization Type 1   Authorization Time Period of 10 g code   PT Start Time 1435   PT Stop Time 1534   PT Time Calculation (min) 59 min   Activity Tolerance Patient tolerated treatment well   Behavior During Therapy [facility] for tasks assessed/performed      Past Medical History:  Diagnosis Date  . Arthritis    osteoarthritis  . Cancer ([place])   . Hyperlipidemia   . Osteopenia    previously taking Evista and Actonel  . Osteoporosis   . Vertigo     Past Surgical History:  Procedure Laterality Date  . CHOLECYSTECTOMY    . lt breast lumpectomy  2014    There were no vitals filed for this visit.       Subjective Assessment - 08/04/16 1440    Subjective R hip pain: 3/10 currently (pt took prednisone and is sitting), 8/10 at worst for the past month.    Pertinent History R hip pain. Pt states she is currently taking prednisone which relevied a lot of her R hip pain. Last dose was yesterday.  R hip pain began around November 2017. Felt like her pain was going to go away at that time but it continued. Pain is located from her R PSIS to R lateral hip and proximal lateral thigh. Feels like her pain started at her R PSIS. Difficulty lying on her R side (feels really sore).  Feels a burning sensation in her R sacroiliac area and a steady ache in her R hip (greater trochanter area).    Patient Stated Goals Go back to walking up stairs with less pain, be able to sleep better.    Currently in Pain? Yes   Pain Score 3    Pain  Location Hip   Pain Orientation Right   Pain Descriptors / Indicators Aching;Sore;Burning   Pain Type Chronic pain   Pain Onset More than a month ago   Pain Frequency Occasional   Aggravating Factors  R S/L (15-30 seconds), standing right after sitting for about half an hour (has to stretch, takes her a few minutes to take her first step), going up steps (worst aggravating factor; can't bear weight onto her R hip when she does it). Not bad going down strairs.    Pain Relieving Factors standing, walking           [facility] PT Assessment - 08/04/16 1453      Assessment   Medical Diagnosis R hip pain   Referring Provider Burnard Hawthorne, FNP   Onset Date/Surgical Date 04/30/16  November 2017. No specific date provided.   Prior Therapy No known physical therapy for current condition.      Precautions   Precaution Comments Hx of breast CA; current osteopenia     Restrictions   Other Position/Activity Restrictions No known restrictions     Balance Screen   Has the patient fallen in the past 6 months No   Has the patient had a decrease in  activity level because of a fear of falling?  No   Is the patient reluctant to leave their home because of a fear of falling?  No     Home Environment   Additional Comments Pt lives in a 2 story home with her husband. 4 steps to enter without rails. Did not specify how many steps inside.      Prior Function   Vocation Full time employment  Director/Preschool   Vocation Requirements PLOF: better able to stand after prolonged sitting, negotiate stairs, and lay on her R side without R hip pain.    Leisure sew, read, walk, photography     Observation/Other Assessments   Observations reprduction of symptoms with R hip quadrant (flexion, abduction, ER), decreases with distraction   Lower Extremity Functional Scale  39/80     Posture/Postural Control   Posture Comments bilaterally protracted shoulders and neck, bilaterally pronated feet,      AROM    Overall AROM Comments bilateral hip extension PROM in S/L [facility]   Lumbar Flexion WFL   Lumbar Extension WFL   Lumbar - Right Side Bend WFL   Lumbar - Left Side [facility] with R Sacroiliac pain but different from her R hip pain.   symptoms with overpressure   Lumbar - Right Rotation WFL with reproduction of R sacroiliac pain. Eases gradually with rest.   in sitting   Lumbar - Left Rotation WFL with R sacroiliac symptoms, eases quickly with rest.   in sitting     PROM   Overall PROM Comments R hip IR in 90/90 position reproduced ache, eased with rest.       Strength   Right Hip Flexion 4-/5   Right Hip Extension 4-/5   Right Hip ABduction 4-/5   Left Hip Flexion 4-/5   Left Hip Extension 4/5   Left Hip ABduction 4/5   Right Knee Flexion 4+/5   Right Knee Extension 4+/5   Left Knee Flexion 4+/5   Left Knee Extension 5/5     Palpation   Palpation comment TTP R lateral hip at the greater trochanter area and R Sacroiliac joint with reproduction of pain     Ambulation/Gait   Gait Comments Bilateral pelvic drop, bilateral femoral adduction and IR during stance phase.   Stair negotiation: bilateral femoral IR and adduction.  Pain with going up. Hip pain decreased with femoral control when ascending stairs.      Objectives   There-ex  Ascending 4 regular steps with bilatearl UE assist 2x with femoral control   Slight decreased R hip pain with going up stairs  R hip manual IR stretch by PT 2x in supine, R foot to the outside of L knee  Decreased R hip symptoms with supine IR at 90/90  Pt states she usually walks a mile. Pt was recommended to start walking for 1/4 mile followed by ice to R hip for 15 min and see how her hip tolerates it. Pt to either walk a little more but gradually if her hip feels good, or less if her hip bothers her. Pt verbalized understanding.   Improved exercise technique, movement at target joints, use of target muscles after min to mod verbal, visual, tactile  cues.                           PT Education - 08/04/16 1832    Education provided Yes   Education Details ther-ex, start off walking  1/4 mile to see how she tolerates it, plan of care   Person(s) Educated Patient   Methods Demonstration;Explanation;Tactile cues;Verbal cues   Comprehension Verbalized understanding;Returned demonstration             PT Long Term Goals - 08/04/16 1753      PT LONG TERM GOAL #1   Title Pt will have a decrease in R lateral hip and SI joint pain to 4/10 or less at worst to promote ability negotiate stairs, stand up after prologed sitting, and tolerate lying on her R side when sleeping.   Baseline 8/10 R lateral hip and SI joint pain at worst (08/04/2016)   Time 4   Period Weeks   Status New     PT LONG TERM GOAL #2   Title Patient will improve bilateral glute med and max strength by at least 1/2 MMT to promote ability to negotiate stairs with less pain.    Time 4   Period Weeks   Status New     PT LONG TERM GOAL #3   Title Patient will improve her LEFS score by at least 9 points as a demonstration of improved function.    Baseline 39/80 (08/04/2016)   Time 4   Period Weeks   Status New               Plan - 08/04/16 1735    Clinical Impression Statement Patient is a 66 year old female who came to physical therapy secondary to R hip pain. She also presents with R sacroiliac joint pain as well, TTP to R lateral hip and R SI joint, bilateral hip weakness, decreased femoral control, and difficulty performing functional tasks such as negotiating stairs, standing up after prolonged sitting, and tolerating positions such as lying on her R side to sleep. Pt will benefit from skilled physical therapy services to address the aforementioned deficits.    Rehab Potential Good   Clinical Impairments Affecting Rehab Potential Chronicity of condition, bilateral knee pain   PT Frequency 2x / week   PT Duration 4 weeks   PT  Treatment/Interventions Manual techniques;Therapeutic activities;Therapeutic exercise;Dry needling;Patient/family education;Neuromuscular re-education;Ultrasound;Iontophoresis 4mg /ml Dexamethasone;Electrical Stimulation;Aquatic Therapy  Modalities if appropriate   PT Next Visit Plan hip strengthening, femoral control, lumbopelvic strengthening and control   Consulted and Agree with Plan of Care Patient      Patient will benefit from skilled therapeutic intervention in order to improve the following deficits and impairments:  Pain, Improper body mechanics, Decreased strength  Visit Diagnosis: Pain in right hip - Plan: PT plan of care cert/re-cert  Muscle weakness (generalized) - Plan: PT plan of care cert/re-cert      G-Codes - [id number]    Functional Assessment Tool Used LEFS, clinical presentation, patient interview   Functional Limitation Mobility: Walking and moving around   Mobility: Walking and Moving Around Current Status [phone]) At least 40 percent but less than 60 percent impaired, limited or restricted   Mobility: Walking and Moving Around Goal Status [phone]) At least 20 percent but less than 40 percent impaired, limited or restricted       Problem List Patient Active Problem List   Diagnosis Date Noted  . Right hip pain 07/29/2016  . Osteoarthritis 04/02/2016  . Screening for cervical cancer 10/08/2015  . Pelvic pain in female 09/10/2015  . Midline thoracic back pain 08/15/2015  . Microscopic hematuria 05/08/2015  . Vertigo 10/27/2014  . Malignant neoplasm of breast (female) ([place]) 11/19/2012  . Routine physical examination  10/27/2012  . Screening for colon cancer 10/27/2012  . Hyperlipidemia 02/17/2011  . Osteopenia 02/17/2011     Joneen Boers PT, DPT  08/04/2016, 6:42 PM  [facility] [address], [place], [address] Phone: [phone]   Fax:  [phone]  Name: Priscille Coiner MRN: [id number] Date of Birth: 08-13-1950

## 2016-08-06 ENCOUNTER — Ambulatory Visit: Payer: Medicare Other

## 2016-08-06 DIAGNOSIS — M6281 Muscle weakness (generalized): Secondary | ICD-10-CM

## 2016-08-06 DIAGNOSIS — M25551 Pain in right hip: Secondary | ICD-10-CM

## 2016-08-06 NOTE — Patient Instructions (Addendum)
  Gave seated R hip ER pain free range 10x for 3 sets daily. Pain free range. Pt demonstrated and verbalized understanding.

## 2016-08-06 NOTE — Therapy (Signed)
[facility] [address] [place], [address] Phone: [phone]   Fax:  [phone]  Physical Therapy Treatment  Patient Details  Name: Deborah Mcconnell MRN: [id number] Date of Birth: 02-Aug-1950 Referring Provider: Burnard Hawthorne, FNP  Encounter Date: 08/06/2016      PT End of Session - 08/06/16 1705    Visit Number 2   Number of Visits 9   Date for PT Re-Evaluation 09/04/16   Authorization Type 2   Authorization Time Period of 10 g code   PT Start Time 1705   PT Stop Time 1737   PT Time Calculation (min) 32 min   Activity Tolerance Patient tolerated treatment well   Behavior During Therapy [facility] for tasks assessed/performed      Past Medical History:  Diagnosis Date  . Arthritis    osteoarthritis  . Cancer ([place])   . Hyperlipidemia   . Osteopenia    previously taking Evista and Actonel  . Osteoporosis   . Vertigo     Past Surgical History:  Procedure Laterality Date  . CHOLECYSTECTOMY    . lt breast lumpectomy  2014    There were no vitals filed for this visit.      Subjective Assessment - 08/06/16 1706    Subjective R hip is sore. I can tell I've been done with the prednisone. 5-6/10 currently.    Pertinent History R hip pain. Pt states she is currently taking prednisone which relevied a lot of her R hip pain. Last dose was yesterday.  R hip pain began around November 2017. Felt like her pain was going to go away at that time but it continued. Pain is located from her R PSIS to R lateral hip and proximal lateral thigh. Feels like her pain started at her R PSIS. Difficulty lying on her R side (feels really sore).  Feels a burning sensation in her R sacroiliac area and a steady ache in her R hip (greater trochanter area).    Patient Stated Goals Go back to walking up stairs with less pain, be able to sleep better.    Currently in Pain? Yes   Pain Score 6    Pain Orientation Right   Pain  Onset More than a month ago                                 PT Education - 08/06/16 1736    Education provided Yes   Education Details ther-ex, HEP   Person(s) Educated Patient   Methods Explanation;Demonstration;Tactile cues;Verbal cues   Comprehension Verbalized understanding;Returned demonstration        Objectives   Manual therapy  Prone P to A R hip grade 3-  Decreased R hip pain with supine 90/90 ER  Supine hooklying position: gentle caudal glide R hip   Decreased R hip pain to 3-4/10     There-ex  Directed patient with seated R hip ER AROM pain free range 10x2. Increased ache after 2nd set.  Then 10x2 again after supine caudal glide to R hip in hooklying. No increased symptoms.   Supine R hip ER in 90/90 multiple times.   Pt was recommended to use ice for her R hip 15 min at a time 4-5x daily to help with symptoms. Pt demonstrated and verbalized understanding.    Improved exercise technique, movement at target joints, use of target muscles after  mod verbal, visual, tactile cues.    Symptom reproduction with supine R hip ER at 90/90. Decreased symptoms with aforementioned motion following manual therapy to R hip with pain decreasing to 3-4/10 (from 6/10 starting pain level). Gave seated R hip ER AROM 10x3 daily pain free range as part of her HEP to maintain ROM gained. Pt was also recommended to use ice to her R hip at home to help with symptoms. Light session today to not overwork pt R hip.         PT Long Term Goals - 08/04/16 1753      PT LONG TERM GOAL #1   Title Pt will have a decrease in R lateral hip and SI joint pain to 4/10 or less at worst to promote ability negotiate stairs, stand up after prologed sitting, and tolerate lying on her R side when sleeping.   Baseline 8/10 R lateral hip and SI joint pain at worst (08/04/2016)   Time 4   Period Weeks   Status New     PT LONG TERM GOAL #2   Title Patient will improve  bilateral glute med and max strength by at least 1/2 MMT to promote ability to negotiate stairs with less pain.    Time 4   Period Weeks   Status New     PT LONG TERM GOAL #3   Title Patient will improve her LEFS score by at least 9 points as a demonstration of improved function.    Baseline 39/80 (08/04/2016)   Time 4   Period Weeks   Status New               Plan - 08/06/16 1743    Clinical Impression Statement Symptom reproduction with supine R hip ER at 90/90. Decreased symptoms with aforementioned motion following manual therapy to R hip with pain decreasing to 3-4/10 (from 6/10 starting pain level). Gave seated R hip ER AROM 10x3 daily pain free range as part of her HEP to maintain ROM gained. Pt was also recommended to use ice to her R hip at home to help with symptoms. Light session today to not overwork pt R hip.    Rehab Potential Good   Clinical Impairments Affecting Rehab Potential Chronicity of condition, bilateral knee pain   PT Frequency 2x / week   PT Duration 4 weeks   PT Treatment/Interventions Manual techniques;Therapeutic activities;Therapeutic exercise;Dry needling;Patient/family education;Neuromuscular re-education;Ultrasound;Iontophoresis 4mg /ml Dexamethasone;Electrical Stimulation;Aquatic Therapy  Modalities if appropriate   PT Next Visit Plan hip strengthening, femoral control, lumbopelvic strengthening and control   Consulted and Agree with Plan of Care Patient      Patient will benefit from skilled therapeutic intervention in order to improve the following deficits and impairments:  Pain, Improper body mechanics, Decreased strength  Visit Diagnosis: Pain in right hip  Muscle weakness (generalized)     Problem List Patient Active Problem List   Diagnosis Date Noted  . Right hip pain 07/29/2016  . Osteoarthritis 04/02/2016  . Screening for cervical cancer 10/08/2015  . Pelvic pain in female 09/10/2015  . Midline thoracic back pain 08/15/2015   . Microscopic hematuria 05/08/2015  . Vertigo 10/27/2014  . Malignant neoplasm of breast (female) ([place]) 11/19/2012  . Routine physical examination 10/27/2012  . Screening for colon cancer 10/27/2012  . Hyperlipidemia 02/17/2011  . Osteopenia 02/17/2011    Joneen Boers PT, DPT   08/06/2016, 6:50 PM  [facility] [address],  [place], [address] Phone: [phone]   Fax:  [phone]  Name: Deborah Mcconnell MRN: [id number] Date of Birth: [birth year]

## 2016-08-07 ENCOUNTER — Ambulatory Visit: Payer: Medicare Other

## 2016-08-13 ENCOUNTER — Ambulatory Visit: Payer: Medicare Other

## 2016-08-13 DIAGNOSIS — M25551 Pain in right hip: Secondary | ICD-10-CM

## 2016-08-13 DIAGNOSIS — M6281 Muscle weakness (generalized): Secondary | ICD-10-CM | POA: Diagnosis not present

## 2016-08-13 NOTE — Therapy (Signed)
[facility] [address] [place], [address] Phone: [phone]   Fax:  [phone]  Physical Therapy Treatment  Patient Details  Name: Deborah Mcconnell MRN: [id number] Date of Birth: 18-Sep-1950 Referring Provider: Burnard Hawthorne, FNP  Encounter Date: 08/13/2016      PT End of Session - 08/13/16 1710    Visit Number 3   Number of Visits 9   Date for PT Re-Evaluation 09/04/16   Authorization Type 3   Authorization Time Period of 10 g code   PT Start Time [id number]   PT Stop Time 1801   PT Time Calculation (min) 51 min   Activity Tolerance Patient tolerated treatment well   Behavior During Therapy [facility] for tasks assessed/performed      Past Medical History:  Diagnosis Date  . Arthritis    osteoarthritis  . Cancer ([place])   . Hyperlipidemia   . Osteopenia    previously taking Evista and Actonel  . Osteoporosis   . Vertigo     Past Surgical History:  Procedure Laterality Date  . CHOLECYSTECTOMY    . lt breast lumpectomy  2014    There were no vitals filed for this visit.      Subjective Assessment - 08/13/16 1710    Subjective R hip has not been good the whole week. Getting really frustrated with it. No changes after last session. 6-7/10 R hip currently. Rolling over in bed is an 8/10, climbing the steps is a 9/10.  Hurts in her R low back area.    Pertinent History R hip pain. Pt states she is currently taking prednisone which relevied a lot of her R hip pain. Last dose was yesterday.  R hip pain began around November 2017. Felt like her pain was going to go away at that time but it continued. Pain is located from her R PSIS to R lateral hip and proximal lateral thigh. Feels like her pain started at her R PSIS. Difficulty lying on her R side (feels really sore).  Feels a burning sensation in her R sacroiliac area and a steady ache in her R hip (greater trochanter area).    Patient Stated Goals Go  back to walking up stairs with less pain, be able to sleep better.    Currently in Pain? Yes   Pain Score 7    Pain Onset More than a month ago                                 PT Education - 08/13/16 1749    Education provided Yes   Education Details ther-ex, HEP   Person(s) Educated Patient   Methods Explanation;Demonstration;Tactile cues;Verbal cues;Handout   Comprehension Returned demonstration;Verbalized understanding        Objectives  There-ex  Pt was recommended to not shift her weight onto her R LE when standing to not place stress to the lateral side of her R hip. Pt demonstrates and verbalized understanding.  Sit <> stand from regular chair with emphasis on femoral control with bilateral UE assist 10x2  Pt was recommend to use an eggshell crate type mattress topper to help her sleep when she accidentally lies down onto her R side. Pt verbalized undrstanding.   Long sit test suggests anterior nutation of R innominate  Supine manual muscle energy technique to correct anterior nutation R hip  Decreased R PSIS area  pain in sitting and when getting from supine to sit.   Seated R hip extension isometrics with R foot on 3 inch step 10x5 seconds for 2 sets. No change in symptoms.   Self supine muscle energy technique to promote posterior nutation of R innominate 5x5 seconds for 2 sets    Reviewed HEP. Pt demonstrated and verbalized understanding.   Seated hip clamshell isometric 50% effort, thighs in neutral 1 min hold, 1 min rest x 3. Slight decrease symptoms.  Ascending and descending 4 regular steps with R rail assist to simulate stairs at home. Cues of use of abdomen and R glute max muscles. Still has R lateral hip pain.    Improved exercise technique, movement at target joints, use of target muscles after mod verbal, visual, tactile cues.     Decreased R posterior hip/PSIS pain with increased use of R glute max muscle. Added gentle hip  abduction isometrics to help with lateral hip pain. 5-6/10 R hip pain after session (except with stairs).        PT Long Term Goals - 08/04/16 1753      PT LONG TERM GOAL #1   Title Pt will have a decrease in R lateral hip and SI joint pain to 4/10 or less at worst to promote ability negotiate stairs, stand up after prologed sitting, and tolerate lying on her R side when sleeping.   Baseline 8/10 R lateral hip and SI joint pain at worst (08/04/2016)   Time 4   Period Weeks   Status New     PT LONG TERM GOAL #2   Title Patient will improve bilateral glute med and max strength by at least 1/2 MMT to promote ability to negotiate stairs with less pain.    Time 4   Period Weeks   Status New     PT LONG TERM GOAL #3   Title Patient will improve her LEFS score by at least 9 points as a demonstration of improved function.    Baseline 39/80 (08/04/2016)   Time 4   Period Weeks   Status New               Plan - 08/13/16 1750    Clinical Impression Statement Decreased R posterior hip/PSIS pain with increased use of R glute max muscle. Added gentle hip abduction isometrics to help with lateral hip pain. 5-6/10 R hip pain after session (except with stairs).    Rehab Potential Good   Clinical Impairments Affecting Rehab Potential Chronicity of condition, bilateral knee pain   PT Frequency 2x / week   PT Duration 4 weeks   PT Treatment/Interventions Manual techniques;Therapeutic activities;Therapeutic exercise;Dry needling;Patient/family education;Neuromuscular re-education;Ultrasound;Iontophoresis 4mg /ml Dexamethasone;Electrical Stimulation;Aquatic Therapy  Modalities if appropriate   PT Next Visit Plan hip strengthening, femoral control, lumbopelvic strengthening and control   Consulted and Agree with Plan of Care Patient      Patient will benefit from skilled therapeutic intervention in order to improve the following deficits and impairments:  Pain, Improper body mechanics, Decreased  strength  Visit Diagnosis: Pain in right hip  Muscle weakness (generalized)     Problem List Patient Active Problem List   Diagnosis Date Noted  . Right hip pain 07/29/2016  . Osteoarthritis 04/02/2016  . Screening for cervical cancer 10/08/2015  . Pelvic pain in female 09/10/2015  . Midline thoracic back pain 08/15/2015  . Microscopic hematuria 05/08/2015  . Vertigo 10/27/2014  . Malignant neoplasm of breast (female) ([place]) 11/19/2012  . Routine physical  examination 10/27/2012  . Screening for colon cancer 10/27/2012  . Hyperlipidemia 02/17/2011  . Osteopenia 02/17/2011    Joneen Boers PT, DPT   08/13/2016, 6:06 PM  [facility] [address] [place], [address] Phone: [phone]   Fax:  [phone]  Name: Malee Juniper MRN: [id number] Date of Birth: [birth year]

## 2016-08-13 NOTE — Patient Instructions (Addendum)
    Pull right knee toward chest. 1. Gently push against your hands (your hands do not let your thigh move) for 5 seconds. Rest for 5 seconds. 2. Gently pull right knee to your chest again.   Repeat steps 1 and 2 for 4 more times.   Activate your abdominal muscles as your return your right leg to neutral.      Repeat _3___ times.     http://gt2.exer.us/225   Copyright  VHI. All rights reserved.      Seated clamshell isometrics    Sitting on your bed (hips less than 90 degrees flexion)   Belt around thighs, which are shoulder width apart,    Press knees out against belt with 40 % effort for 1 minute   Rest for 1-2 minutes   Perform 5 times per set.     Do 3 sets per day. Each set to be performed a few hours apart.

## 2016-08-15 ENCOUNTER — Ambulatory Visit (INDEPENDENT_AMBULATORY_CARE_PROVIDER_SITE_OTHER): Payer: Medicare Other

## 2016-08-15 VITALS — BP 110/62 | HR 73 | Temp 97.9°F | Resp 12 | Ht 72.0 in | Wt 181.4 lb

## 2016-08-15 DIAGNOSIS — Z0000 Encounter for general adult medical examination without abnormal findings: Secondary | ICD-10-CM

## 2016-08-15 NOTE — Patient Instructions (Addendum)
  Ms. Lenker , Thank you for taking time to come for your Medicare Wellness Visit. I appreciate your ongoing commitment to your health goals. Please review the following plan we discussed and let me know if I can assist you in the future.   Follow up with Mable Paris, FNP as needed.  These are the goals we discussed: Goals    . Increase physical activity          Walk for exercise as tolerated     . Increase water intake          Stay hydrated and drink plenty of water Finish the entire cup of water when taking medication       This is a list of the screening recommended for you and due dates:  Health Maintenance  Topic Date Due  . Mammogram  11/09/2016  . Pneumonia vaccines (2 of 2 - PPSV23) 04/28/2017  . Colon Cancer Screening  07/24/2024  . Tetanus Vaccine  10/04/2024  . Flu Shot  Completed  . DEXA scan (bone density measurement)  Completed  . Shingles Vaccine  Completed  .  Hepatitis C: One time screening is recommended by Center for Disease Control  (CDC) for  adults born from [birth year] through 1965.   Completed

## 2016-08-15 NOTE — Progress Notes (Signed)
Subjective:   Deborah Mcconnell is a 66 y.o. female who presents for an Initial Medicare Annual Wellness Visit.  Review of Systems    No ROS.  Medicare Wellness Visit.  Cardiac Risk Factors include: advanced age (>61men, >44 women)     Objective:    Today's Vitals   08/15/16 0815  BP: 110/62  Pulse: 73  Resp: 12  Temp: 97.9 F (36.6 C)  TempSrc: Oral  SpO2: 95%  Weight: 181 lb 6.4 oz (82.3 kg)  Height: 6' (1.829 m)   Body mass index is 24.6 kg/m.   Current Medications (verified) Outpatient Encounter Prescriptions as of 08/15/2016  Medication Sig  . Ascorbic Acid (VITAMIN C) 1000 MG tablet Take 1,000 mg by mouth daily.  . calcium carbonate (CALCIUM 600) 600 MG TABS tablet Take by mouth. Take 1,200 mg by mouth.  . cholecalciferol (VITAMIN D) 1000 UNITS tablet Take 1,000 Units by mouth daily.  . diclofenac sodium (VOLTAREN) 1 % GEL Apply 4 g topically 4 (four) times daily.  Marland Kitchen ibuprofen (ADVIL,MOTRIN) 200 MG tablet Take 600 mg by mouth 2 (two) times daily. prn  . letrozole (FEMARA) 2.5 MG tablet TAKE ONE TABLET BY MOUTH ONCE DAILY  . meclizine (ANTIVERT) 25 MG tablet Take 1 tablet (25 mg total) by mouth 3 (three) times daily as needed.  . naproxen sodium (ANAPROX) 220 MG tablet Take by mouth.  . simvastatin (ZOCOR) 20 MG tablet Take 1 tablet (20 mg total) by mouth every evening.  . SUPER B COMPLEX/C CAPS Take by mouth.  . [DISCONTINUED] predniSONE (DELTASONE) 10 MG tablet Take 4 tablets ( total 40 mg) by mouth for 2 days; take 3 tablets ( total 30 mg) by mouth for 2 days; take 2 tablets ( total 20 mg) by mouth for 1 day; take 1 tablet ( total 10 mg) by mouth for 1 day.   No facility-administered encounter medications on file as of 08/15/2016.     Allergies (verified) Ciprofloxacin; Ofloxacin; Quinolones; Actonel [risedronate]; Augmentin [amoxicillin-pot clavulanate]; and Bactrim [sulfamethoxazole-trimethoprim]   History: Past Medical History:  Diagnosis Date  .  Arthritis    osteoarthritis  . Cancer ([place])   . Hyperlipidemia   . Osteopenia    previously taking Evista and Actonel  . Osteoporosis   . Vertigo    Past Surgical History:  Procedure Laterality Date  . CHOLECYSTECTOMY    . lt breast lumpectomy  2014   Family History  Problem Relation Age of Onset  . Osteoporosis Mother   . Hypertension Mother   . Osteoarthritis Mother   . Osteoporosis Sister   . Osteoarthritis Sister   . Osteoarthritis Maternal Grandmother   . Diabetes Maternal Grandfather   . Cancer Paternal Grandfather     unknown  . Prostate cancer Neg Hx   . Bladder Cancer Neg Hx   . Kidney cancer Neg Hx    Social History   Occupational History  . Not on file.   Social History Main Topics  . Smoking status: Never Smoker  . Smokeless tobacco: Never Used  . Alcohol use 0.0 oz/week     Comment: rare  . Drug use: No  . Sexual activity: Yes    Tobacco Counseling Counseling given: Not Answered   Activities of Daily Living In your present state of health, do you have any difficulty performing the following activities: 08/15/2016  Hearing? N  Vision? N  Difficulty concentrating or making decisions? N  Walking or climbing stairs? Y  Dressing or  bathing? N  Doing errands, shopping? N  Preparing Food and eating ? N  Using the Toilet? N  In the past six months, have you accidently leaked urine? N  Do you have problems with loss of bowel control? N  Managing your Medications? N  Managing your Finances? N  Housekeeping or managing your Housekeeping? N  Some recent data might be hidden    Immunizations and Health Maintenance Immunization History  Administered Date(s) Administered  . Influenza Whole 04/13/2013  . Influenza, High Dose Seasonal PF 02/27/2016  . Influenza,inj,Quad PF,36+ Mos 06/13/2015  . Influenza-Unspecified 04/03/2014  . Pneumococcal Conjugate-13 09/10/2015  . Pneumococcal Polysaccharide-23 04/28/2012  . Tdap 10/05/2014  . Zoster  05/08/2014   There are no preventive care reminders to display for this patient.  Patient Care Team: Burnard Hawthorne, FNP as PCP - General (Family Medicine)  Indicate any recent Medical Services you may have received from other than [facility] providers in the past year (date may be approximate).     Assessment:   This is a routine wellness examination for [place]. The goal of the wellness visit is to assist the patient how to close the gaps in care and create a preventative care plan for the patient.   Taking calcium VIT D as appropriate/Osteoporosis risk reviewed.  Medications reviewed; taking without issues or barriers.  Safety issues reviewed; smoke detectors in the home. No firearms in the home. Wears seatbelts when driving or riding with others. No violence in the home.  No identified risk were noted; The patient was oriented x 3; appropriate in dress and manner and no objective failures at ADL's or IADL's.   BMI; discussed the importance of a healthy diet, water intake and exercise. Educational material provided.  Health maintenance gaps; closed.  Patient Concerns: None at this time. Follow up with PCP as needed.  Hearing/Vision screen Hearing Screening Comments: Passes the whisper test Vision Screening Comments: Followed by [facility] Wears corrective lenses Last 11/2015 Visual acuity not assessed per patient preference since she has regular follow up with her ophthalmologist  Dietary issues and exercise activities discussed: Current Exercise Habits: Structured exercise class, Type of exercise: stretching (Physical therapy), Time (Minutes): 60, Frequency (Times/Week): 1, Weekly Exercise (Minutes/Week): 60, Intensity: Mild  Goals    . Increase physical activity          Walk for exercise as tolerated     . Increase water intake          Stay hydrated and drink plenty of water Finish the entire cup of water when taking medication      Depression  Screen PHQ 2/9 Scores 08/15/2016 07/29/2016 04/02/2016 02/27/2016 05/15/2015  PHQ - 2 Score 0 0 0 0 0    Fall Risk Fall Risk  08/15/2016 07/29/2016 04/02/2016 02/27/2016  Falls in the past year? No No No No    Cognitive Function: MMSE - Mini Mental State Exam 08/15/2016  Orientation to time 5  Orientation to Place 5  Registration 3  Attention/ Calculation 5  Recall 3  Language- name 2 objects 2  Language- repeat 1  Language- follow 3 step command 3  Language- read & follow direction 1  Write a sentence 1  Copy design 1  Total score 30     6CIT Screen 08/15/2016  What Year? 0 points  What month? 0 points  What time? 0 points  Count back from 20 0 points  Months in reverse 0 points  Repeat phrase 0  points  Total Score 0    Screening Tests Health Maintenance  Topic Date Due  . MAMMOGRAM  11/09/2016  . PNA vac Low Risk Adult (2 of 2 - PPSV23) 04/28/2017  . COLONOSCOPY  07/24/2024  . TETANUS/TDAP  10/04/2024  . INFLUENZA VACCINE  Completed  . DEXA SCAN  Completed  . ZOSTAVAX  Completed  . Hepatitis C Screening  Completed      Plan:    End of life planning; Advance aging; Advanced directives discussed. Copy of current HCPOA/Living Will requested.  Medicare Attestation I have personally reviewed: The patient's medical and social history Their use of alcohol, tobacco or illicit drugs Their current medications and supplements The patient's functional ability including ADLs,fall risks, home safety risks, cognitive, and hearing and visual impairment Diet and physical activities Evidence for depression   The patient's weight, height, BMI, and visual acuity have been recorded in the chart.  I have made referrals and provided education to the patient based on review of the above and I have provided the patient with a written personalized care plan for preventive services.    During the course of the visit, Kyesha was educated and counseled about the following appropriate  screening and preventive services:   Vaccines to include Pneumoccal, Influenza, Hepatitis B, Td, Zostavax, HCV  Electrocardiogram  Cardiovascular disease screening  Colorectal cancer screening  Bone density screening  Diabetes screening  Glaucoma screening  Mammography/PAP  Nutrition counseling  Smoking cessation counseling  Patient Instructions (the written plan) were given to the patient.    Varney Biles, LPN   [phone]

## 2016-08-17 NOTE — Progress Notes (Signed)
  I have reviewed the above information and agree with above.   Kingsley Herandez, MD 

## 2016-08-20 ENCOUNTER — Ambulatory Visit: Payer: Medicare Other

## 2016-08-20 DIAGNOSIS — M25551 Pain in right hip: Secondary | ICD-10-CM | POA: Diagnosis not present

## 2016-08-20 DIAGNOSIS — M6281 Muscle weakness (generalized): Secondary | ICD-10-CM

## 2016-08-20 NOTE — Patient Instructions (Signed)
Strengthening: Resisted Extension    Tighten abdominal and pelvic floor muscles.   Hold band, one in each hand, arms forward. Pull arms back, elbow straight. Hold for 5 seconds Repeat ___10_ times per set. Do _3___ sets per session. Do _1__ sessions per day.  http://orth.exer.us/832   Copyright  VHI. All rights reserved.

## 2016-08-20 NOTE — Therapy (Signed)
[facility] [address] [place], [address] Phone: [phone]   Fax:  [phone]  Physical Therapy Treatment  Patient Details  Name: Deborah Mcconnell MRN: [id number] Date of Birth: December 29, 1950 Referring Provider: Burnard Hawthorne, FNP  Encounter Date: 08/20/2016      PT End of Session - 08/20/16 1303    Visit Number 4   Number of Visits 9   Date for PT Re-Evaluation 09/04/16   Authorization Type 4   Authorization Time Period of 10 g code   PT Start Time 1303   PT Stop Time 1401   PT Time Calculation (min) 58 min   Activity Tolerance Patient tolerated treatment well   Behavior During Therapy [facility] for tasks assessed/performed      Past Medical History:  Diagnosis Date  . Arthritis    osteoarthritis  . Cancer ([place])   . Hyperlipidemia   . Osteopenia    previously taking Evista and Actonel  . Osteoporosis   . Vertigo     Past Surgical History:  Procedure Laterality Date  . CHOLECYSTECTOMY    . lt breast lumpectomy  2014    There were no vitals filed for this visit.      Subjective Assessment - 08/20/16 1303    Subjective R hip does not bother her unless she goes up steps. Has to get up 3 steps to get into her home. No R hip pain currently when sitting. Aware of an ache in her R low back. Better than last week.  4/10 R PSIS, 2-3/10 R hip currently. Had a hard time getting comfortable in bed. Was able to get an eggshell crate mattress topper.  Has a hx of L breast CA 4 years ago in May, and had surgery December 09, 2012 L breast to remove CA. Currently cancer free as far she knows.    Pertinent History R hip pain. Pt states she is currently taking prednisone which relevied a lot of her R hip pain. Last dose was yesterday.  R hip pain began around November 2017. Felt like her pain was going to go away at that time but it continued. Pain is located from her R PSIS to R lateral hip and proximal lateral  thigh. Feels like her pain started at her R PSIS. Difficulty lying on her R side (feels really sore).  Feels a burning sensation in her R sacroiliac area and a steady ache in her R hip (greater trochanter area).    Patient Stated Goals Go back to walking up stairs with less pain, be able to sleep better.    Currently in Pain? Yes   Pain Score 4    Pain Onset More than a month ago                                 PT Education - 08/20/16 1315    Education provided Yes   Education Details ther-ex   [employer]) Educated Patient   Methods Explanation;Demonstration;Tactile cues;Verbal cues   Comprehension Returned demonstration;Verbalized understanding        Objectives  Manual therapy   Supine manual muscle energy technique to correct anterior nutation R hip  No change in PSIS symptoms in sitting. No R hip symptoms in sitting aftewreards  L S/L: grade 1 lumbar rotational mobilization  Slight decrease R low back/PSIS area symptoms.     There-ex  Seated hip clamshell isometric 50% effort, thighs in neutral 1 min hold, 1 min rest x 4 contractions  Standing bilateral shoulder extension resisting yellow band 10x5 seconds for 2 sets resisting red band with abdominal contraction to promote trunk muscle use.   Reviewed and given as part of her HEP. Pt demonstrated and verbalized understanding.   Improved exercise technique, movement at target joints, use of target muscles after mod verbal, visual, tactile cues.    Electrical stimulation:  High Volt E-Stim to R hip in L S/L, pillows between knees for neutral thighs x 15 min. 45 V.  Decreased R lateral hip paint to 1-2/10   Decreased R lateral hip pain with muscle energy technique to promote posterior nutation of R innominate as well as with gentle lumbar rotational mobilization grade 1 for pain control. No increase in symptoms with exercises or treatment. Also worked on using trunk muscles to help promote  better control and positioning of her pelvis. Improved R low back and R lateral hip pain since last session.              PT Long Term Goals - 08/04/16 1753      PT LONG TERM GOAL #1   Title Pt will have a decrease in R lateral hip and SI joint pain to 4/10 or less at worst to promote ability negotiate stairs, stand up after prologed sitting, and tolerate lying on her R side when sleeping.   Baseline 8/10 R lateral hip and SI joint pain at worst (08/04/2016)   Time 4   Period Weeks   Status New     PT LONG TERM GOAL #2   Title Patient will improve bilateral glute med and max strength by at least 1/2 MMT to promote ability to negotiate stairs with less pain.    Time 4   Period Weeks   Status New     PT LONG TERM GOAL #3   Title Patient will improve her LEFS score by at least 9 points as a demonstration of improved function.    Baseline 39/80 (08/04/2016)   Time 4   Period Weeks   Status New               Plan - 08/20/16 1316    Clinical Impression Statement Decreased R lateral hip pain with muscle energy technique to promote posterior nutation of R innominate as well as with gentle lumbar rotational mobilization grade 1 for pain control. No increase in symptoms with exercises or treatment. Also worked on using trunk muscles to help promote better control and positioning of her pelvis. Improved R low back and R lateral hip pain since last session.    Rehab Potential Good   Clinical Impairments Affecting Rehab Potential Chronicity of condition, bilateral knee pain   PT Frequency 2x / week   PT Duration 4 weeks   PT Treatment/Interventions Manual techniques;Therapeutic activities;Therapeutic exercise;Dry needling;Patient/family education;Neuromuscular re-education;Ultrasound;Iontophoresis 4mg /ml Dexamethasone;Electrical Stimulation;Aquatic Therapy  Modalities if appropriate   PT Next Visit Plan hip strengthening, femoral control, lumbopelvic strengthening and control    Consulted and Agree with Plan of Care Patient      Patient will benefit from skilled therapeutic intervention in order to improve the following deficits and impairments:  Pain, Improper body mechanics, Decreased strength  Visit Diagnosis: Pain in right hip  Muscle weakness (generalized)     Problem List Patient Active Problem List   Diagnosis Date Noted  . Right hip pain 07/29/2016  . Osteoarthritis 04/02/2016  .  Screening for cervical cancer 10/08/2015  . Pelvic pain in female 09/10/2015  . Midline thoracic back pain 08/15/2015  . Microscopic hematuria 05/08/2015  . Vertigo 10/27/2014  . Malignant neoplasm of breast (female) ([place]) 11/19/2012  . Routine physical examination 10/27/2012  . Screening for colon cancer 10/27/2012  . Hyperlipidemia 02/17/2011  . Osteopenia 02/17/2011    Joneen Boers PT, DPT   08/20/2016, 2:10 PM  [facility] [address] [place], [address] Phone: [phone]   Fax:  [phone]  Name: Deborah Mcconnell MRN: [id number] Date of Birth: July 01, 1950

## 2016-08-26 ENCOUNTER — Ambulatory Visit: Payer: Medicare Other

## 2016-08-26 DIAGNOSIS — M25551 Pain in right hip: Secondary | ICD-10-CM | POA: Diagnosis not present

## 2016-08-26 DIAGNOSIS — M6281 Muscle weakness (generalized): Secondary | ICD-10-CM | POA: Diagnosis not present

## 2016-08-26 NOTE — Therapy (Signed)
[facility] [address] [place], [address] Phone: [phone]   Fax:  [phone]  Physical Therapy Treatment  Patient Details  Name: Deborah Mcconnell MRN: [id number] Date of Birth: Jun 20, 1951 Referring Provider: Burnard Hawthorne, FNP  Encounter Date: 08/26/2016      PT End of Session - 08/26/16 1706    Visit Number 4   Number of Visits 9   Date for PT Re-Evaluation 09/04/16   Authorization Type 4   Authorization Time Period of 10 g code   PT Start Time 1706   PT Stop Time 1748   PT Time Calculation (min) 42 min   Activity Tolerance Patient tolerated treatment well   Behavior During Therapy [facility] for tasks assessed/performed      Past Medical History:  Diagnosis Date  . Arthritis    osteoarthritis  . Cancer ([place])   . Hyperlipidemia   . Osteopenia    previously taking Evista and Actonel  . Osteoporosis   . Vertigo     Past Surgical History:  Procedure Laterality Date  . CHOLECYSTECTOMY    . lt breast lumpectomy  2014    There were no vitals filed for this visit.      Subjective Assessment - 08/26/16 1707    Subjective R hip aches in the R low back area (4/10 ache). The R side of her hip does not bother her (0/10 currently) unless she does stairs.  Better after last session (wednesday). Bothered her Saturday, does not know why. Could not walk. Had to take Aleve.    Pertinent History R hip pain. Pt states she is currently taking prednisone which relevied a lot of her R hip pain. Last dose was yesterday.  R hip pain began around November 2017. Felt like her pain was going to go away at that time but it continued. Pain is located from her R PSIS to R lateral hip and proximal lateral thigh. Feels like her pain started at her R PSIS. Difficulty lying on her R side (feels really sore).  Feels a burning sensation in her R sacroiliac area and a steady ache in her R hip (greater trochanter area).     Patient Stated Goals Go back to walking up stairs with less pain, be able to sleep better.    Currently in Pain? Yes   Pain Score 4   R low back   Pain Descriptors / Indicators Aching   Pain Onset More than a month ago                                 PT Education - 08/26/16 1731    Education provided Yes   Education Details ther-ex, HEP   Person(s) Educated Patient   Methods Explanation;Demonstration;Tactile cues;Verbal cues;Handout   Comprehension Verbalized understanding;Returned demonstration        Objectives  There-ex  Seated R hip extension isometrics with R foot on stool 5x10 seconds for 3 sets  Decreased R posterior low back/PSIS pain to 3/10   SLS on R LE with L tip toe assist and bilateral UE assist 10x3 for 5 seconds   Supine transversus abdominis contraction 10x5 seconds  Then with pelvic floor contraction 10x5 seconds  Then with supine hip fallouts 5x 3 each LE. Difficulty with pelvic control with R hip fallout resulting in R pelvic rotation. Pain free range. Increased R posterior back discomfort  further into the R hip fallout position.   Increased time secondary to emphasis on quality of movement.   Standing forward weight shifting onto R LE in the static mini forward lunge position 10x5 seconds to help prepare for stair negotiation   Seated posterior pelvic tilts 10x3  Decreased R posterior low back/PSIS ache to 2/10  Reviewed and given as part of her HEP.    Improved exercise technique, movement at target joints, use of target muscles after mod verbal, visual, tactile cues.    Decreased symptoms with seated R hip extension isometrics, and posterior pelvic tilting. Also worked on trunk muscle activation, and lumbopelvic control to help maintain good lumbopelvic position. Decreased R low back/PSIS ache to 2/10 after session. No aggravation of R lateral hip pain which continued to be 0/10 sitting rest (not stairs) at end of session.           PT Long Term Goals - 08/04/16 1753      PT LONG TERM GOAL #1   Title Pt will have a decrease in R lateral hip and SI joint pain to 4/10 or less at worst to promote ability negotiate stairs, stand up after prologed sitting, and tolerate lying on her R side when sleeping.   Baseline 8/10 R lateral hip and SI joint pain at worst (08/04/2016)   Time 4   Period Weeks   Status New     PT LONG TERM GOAL #2   Title Patient will improve bilateral glute med and max strength by at least 1/2 MMT to promote ability to negotiate stairs with less pain.    Time 4   Period Weeks   Status New     PT LONG TERM GOAL #3   Title Patient will improve her LEFS score by at least 9 points as a demonstration of improved function.    Baseline 39/80 (08/04/2016)   Time 4   Period Weeks   Status New               Plan - 08/26/16 1740    Clinical Impression Statement Decreased symptoms with seated R hip extension isometrics, and posterior pelvic tilting. Also worked on trunk muscle activation, and lumbopelvic control to help maintain good lumbopelvic position. Decreased R low back/PSIS ache to 2/10 after session. No aggravation of R lateral hip pain which continued to be 0/10 sitting rest (not stairs) at end of session.    Rehab Potential Good   Clinical Impairments Affecting Rehab Potential Chronicity of condition, bilateral knee pain   PT Frequency 2x / week   PT Duration 4 weeks   PT Treatment/Interventions Manual techniques;Therapeutic activities;Therapeutic exercise;Dry needling;Patient/family education;Neuromuscular re-education;Ultrasound;Iontophoresis 4mg /ml Dexamethasone;Electrical Stimulation;Aquatic Therapy  Modalities if appropriate   PT Next Visit Plan hip strengthening, femoral control, lumbopelvic strengthening and control   Consulted and Agree with Plan of Care Patient      Patient will benefit from skilled therapeutic intervention in order to improve the following deficits  and impairments:  Pain, Improper body mechanics, Decreased strength  Visit Diagnosis: Pain in right hip  Muscle weakness (generalized)     Problem List Patient Active Problem List   Diagnosis Date Noted  . Right hip pain 07/29/2016  . Osteoarthritis 04/02/2016  . Screening for cervical cancer 10/08/2015  . Pelvic pain in female 09/10/2015  . Midline thoracic back pain 08/15/2015  . Microscopic hematuria 05/08/2015  . Vertigo 10/27/2014  . Malignant neoplasm of breast (female) ([place]) 11/19/2012  . Routine physical examination 10/27/2012  .  Screening for colon cancer 10/27/2012  . Hyperlipidemia 02/17/2011  . Osteopenia 02/17/2011    Joneen Boers PT, DPT   08/26/2016, 5:55 PM  [facility] [address], [place], [address] Phone: [phone]   Fax:  [phone]  Name: Paisyn Youngdahl MRN: [id number] Date of Birth: 1951-02-16

## 2016-08-26 NOTE — Patient Instructions (Addendum)
   Transversus abdominis contraction.    Pretend that there is a string attached from one side of your pelvis to the other.    Tighten that "string."   Also tighten pelvic floor muscles   Hold for 5 seconds.   Perform throughout the day (at least 3 sets of 10 daily)    Gave seated R hip extension isometrics with R foot on stool 5x10 seconds for 3 sets daily and SLS on R LE with L tip toe assist and bilateral UE assist 10x3 for 5 seconds daily as part of her HEP. Pt demonstrated and verbalized understanding. Handout provided    Gave seated posterior pelvic tilts 10x3 daily as part of HEP. Pt demonstrated and verbalized understanding.

## 2016-09-01 ENCOUNTER — Ambulatory Visit: Payer: Medicare Other | Attending: Family

## 2016-09-01 DIAGNOSIS — M6281 Muscle weakness (generalized): Secondary | ICD-10-CM

## 2016-09-01 DIAGNOSIS — M25551 Pain in right hip: Secondary | ICD-10-CM | POA: Diagnosis not present

## 2016-09-01 NOTE — Therapy (Signed)
[facility] [address], [place], [address] Phone: [phone]   Fax:  [phone]  Physical Therapy Treatment  Patient Details  Name: Deborah Mcconnell MRN: [id number] Date of Birth: 12-01-1950 Referring Provider: Burnard Hawthorne, FNP  Encounter Date: 09/01/2016      PT End of Session - 09/01/16 1648    Visit Number 6   Number of Visits 17   Date for PT Re-Evaluation 10/02/16   Authorization Type 6   Authorization Time Period of 10 g code   PT Start Time 1648   PT Stop Time 1731   PT Time Calculation (min) 43 min   Activity Tolerance Patient tolerated treatment well   Behavior During Therapy [facility] for tasks assessed/performed      Past Medical History:  Diagnosis Date  . Arthritis    osteoarthritis  . Cancer ([place])   . Hyperlipidemia   . Osteopenia    previously taking Evista and Actonel  . Osteoporosis   . Vertigo     Past Surgical History:  Procedure Laterality Date  . CHOLECYSTECTOMY    . lt breast lumpectomy  2014    There were no vitals filed for this visit.      Subjective Assessment - 09/01/16 1650    Subjective R PSIS area is so much better. 2-3/10 currently (took an Aleve because it hurt so bad a few nights ago). The R lateral hip has not bothered her much at all (0/10 currently when just sitting).  The steps are not as excruciating as it has been. Has 4 steps (bilateral rail)  at work and there is no avoiding them.  4/10 R PSIS and lateral hip pain at most for the past 7 days, other than the steps.    Pertinent History R hip pain. Pt states she is currently taking prednisone which relevied a lot of her R hip pain. Last dose was yesterday.  R hip pain began around November 2017. Felt like her pain was going to go away at that time but it continued. Pain is located from her R PSIS to R lateral hip and proximal lateral thigh. Feels like her pain started at her R PSIS. Difficulty lying  on her R side (feels really sore).  Feels a burning sensation in her R sacroiliac area and a steady ache in her R hip (greater trochanter area).    Patient Stated Goals Go back to walking up stairs with less pain, be able to sleep better.    Currently in Pain? Yes   Pain Score 3   R PSIS area   Pain Onset More than a month ago                                 PT Education - 09/01/16 1706    Education provided Yes   Education Details ther-ex, HEP   Person(s) Educated Patient   Methods Explanation;Demonstration;Tactile cues;Verbal cues;Handout   Comprehension Verbalized understanding;Returned demonstration        Objectives  4/10 R PSIS and lateral hip pain at most for the past 7 days, other than the steps.   Minimal to no TTP R lateral hip    There-ex  Static mini forward lunge 5x3 with R LE, L UE assist. Cues for femoral and pelvic control.   Reviewed and given as part of her HEP. Pt demonstrated and verbalized understanding.  Forward step up onto Air Ex pad with R LE, L UE assist 5x3, emphasis on femoral and pelvic control   Seated R hip extension isometrics with R foot on 6 inch step 10x5 seconds  Seated posterior pelvic tilts 10x2  Supine transversus abdominis contraction with pelvic floor contraction 10x5 seconds             Then with supine hip fallouts 5x 3 each LE.    Reviewed and given as part of her HEP. Pt demonstrated and verbalized understanding.     Decreased R PSIS pain to 1-2/10.    Supine bridge 10x3 with transversus abdominis, pelvic floor, and glute max contraction     Improved exercise technique, movement at target joints, use of target muscles after min to mod verbal, visual, tactile cues.    Pt demonstrates decreased R PSIS and lateral hip pain levels at worst since initial evaluation with pain levels decreasing from 8/10 to 4/10. Pt also demonstrates decreased TTP R lateral hip and improved LEFS score suggesting  improved function. Pt making good progress towards goals. Pt still has difficulty with stair negotiation due to symptoms, difficulty with femoral and pelvic control, R lateral hip and PSIS pain, and difficulty performing functional tasks and would benefit from continued skilled physical therapy services to address the aforementioned deficits.           PT Long Term Goals - 09/01/16 1858      PT LONG TERM GOAL #1   Title Pt will have a decrease in R lateral hip and SI joint pain to 4/10 or less at worst to promote ability negotiate stairs, stand up after prologed sitting, and tolerate lying on her R side when sleeping.   Baseline 8/10 R lateral hip and SI joint pain at worst (08/04/2016); 4/10 R lateral hip and SI/PSIS joint pain at worst (09/01/2016)   Time 4   Period Weeks   Status On-going     PT LONG TERM GOAL #2   Title Patient will improve bilateral glute med and max strength by at least 1/2 MMT to promote ability to negotiate stairs with less pain.    Time 4   Period Weeks   Status On-going     PT LONG TERM GOAL #3   Title Patient will improve her LEFS score by at least 9 points as a demonstration of improved function.    Baseline 39/80 (08/04/2016); 47/80 (09/01/2016)   Time 4   Period Weeks   Status Partially Met               Plan - 09/01/16 1708    Clinical Impression Statement Pt demonstrates decreased R PSIS and lateral hip pain levels at worst since initial evaluation with pain levels decreasing from 8/10 to 4/10. Pt also demonstrates decreased TTP R lateral hip and improved LEFS score suggesting improved function. Pt making good progress towards goals. Pt still has difficulty with stair negotiation due to symptoms, difficulty with femoral and pelvic control, R lateral hip and PSIS pain, and difficulty performing functional tasks and would benefit from continued skilled physical therapy services to address the aforementioned deficits.    Rehab Potential Good   Clinical  Impairments Affecting Rehab Potential Chronicity of condition, bilateral knee pain   PT Frequency 2x / week   PT Duration 4 weeks   PT Treatment/Interventions Manual techniques;Therapeutic activities;Therapeutic exercise;Dry needling;Patient/family education;Neuromuscular re-education;Ultrasound;Iontophoresis 64m/ml Dexamethasone;Electrical Stimulation;Aquatic Therapy  Modalities if appropriate   PT Next Visit Plan hip strengthening, femoral  control, lumbopelvic strengthening and control   Consulted and Agree with Plan of Care Patient      Patient will benefit from skilled therapeutic intervention in order to improve the following deficits and impairments:  Pain, Improper body mechanics, Decreased strength  Visit Diagnosis: Pain in right hip - Plan: PT plan of care cert/re-cert  Muscle weakness (generalized) - Plan: PT plan of care cert/re-cert     Problem List Patient Active Problem List   Diagnosis Date Noted  . Right hip pain 07/29/2016  . Osteoarthritis 04/02/2016  . Screening for cervical cancer 10/08/2015  . Pelvic pain in female 09/10/2015  . Midline thoracic back pain 08/15/2015  . Microscopic hematuria 05/08/2015  . Vertigo 10/27/2014  . Malignant neoplasm of breast (female) ([place]) 11/19/2012  . Routine physical examination 10/27/2012  . Screening for colon cancer 10/27/2012  . Hyperlipidemia 02/17/2011  . Osteopenia 02/17/2011     Joneen Boers PT, DPT  09/01/2016, 7:11 PM  [facility] [address] [place], [address] Phone: [phone]   Fax:  [phone]  Name: Velisa Regnier MRN: [id number] Date of Birth: 09/21/1950

## 2016-09-01 NOTE — Patient Instructions (Addendum)
  Static mini forward lunge 5x3 daily with R LE, L UE assist, emphasis on femoral and pelvic control.  Reviewed and given as part of her HEP. Pt demonstrated and verbalized understanding.    Gave supine hip fallouts 10x3 with 5 seconds for 3 sets daily, 5 days per week as part of her HEP. Pt demonstrated and verbalized understanding. Handout provided.

## 2016-09-02 DIAGNOSIS — Z170 Estrogen receptor positive status [ER+]: Secondary | ICD-10-CM | POA: Diagnosis not present

## 2016-09-02 DIAGNOSIS — C50312 Malignant neoplasm of lower-inner quadrant of left female breast: Secondary | ICD-10-CM | POA: Diagnosis not present

## 2016-09-03 ENCOUNTER — Ambulatory Visit: Payer: Medicare Other

## 2016-09-03 DIAGNOSIS — M6281 Muscle weakness (generalized): Secondary | ICD-10-CM | POA: Diagnosis not present

## 2016-09-03 DIAGNOSIS — M25551 Pain in right hip: Secondary | ICD-10-CM

## 2016-09-03 NOTE — Therapy (Signed)
[facility] [address] [place], [address] Phone: [phone]   Fax:  [phone]  Physical Therapy Treatment  Patient Details  Name: Deborah Mcconnell MRN: [id number] Date of Birth: 07-21-1950 Referring Provider: Burnard Hawthorne, FNP  Encounter Date: 09/03/2016      PT End of Session - 09/03/16 1437    Visit Number 7   Number of Visits 17   Date for PT Re-Evaluation 10/02/16   Authorization Type 7   Authorization Time Period of 10 g code   PT Start Time 7371   PT Stop Time 1520   PT Time Calculation (min) 43 min   Activity Tolerance Patient tolerated treatment well   Behavior During Therapy [facility] for tasks assessed/performed      Past Medical History:  Diagnosis Date  . Arthritis    osteoarthritis  . Cancer ([place])   . Hyperlipidemia   . Osteopenia    previously taking Evista and Actonel  . Osteoporosis   . Vertigo     Past Surgical History:  Procedure Laterality Date  . CHOLECYSTECTOMY    . lt breast lumpectomy  2014    There were no vitals filed for this visit.      Subjective Assessment - 09/03/16 1438    Subjective R PSIS is a 3/10 currently, 4/10 R lateral hip pain currently. Getting out of the car bothers her R hip (including hip flexor area) on the side and all the way across her R thigh.  Bothered her more yesterday. Felt good after last session.  Walking and moving around helps.    Pertinent History R hip pain. Pt states she is currently taking prednisone which relevied a lot of her R hip pain. Last dose was yesterday.  R hip pain began around November 2017. Felt like her pain was going to go away at that time but it continued. Pain is located from her R PSIS to R lateral hip and proximal lateral thigh. Feels like her pain started at her R PSIS. Difficulty lying on her R side (feels really sore).  Feels a burning sensation in her R sacroiliac area and a steady ache in her R hip  (greater trochanter area).    Patient Stated Goals Go back to walking up stairs with less pain, be able to sleep better.    Currently in Pain? Yes   Pain Score 4    Pain Orientation Right   Pain Onset More than a month ago                                 PT Education - 09/03/16 1507    Education provided Yes   Education Details there-ex   [employer]) Educated Patient   Methods Explanation;Demonstration;Tactile cues;Verbal cues   Comprehension Returned demonstration;Verbalized understanding        Objectives  R LE symptoms occur around R L1 or 2 dermatome or hip joint area  Per pt. Pain increases after standing up from sitting a while. Pt states that the exercises feel good.     There-ex  Seated trunk rotation to the R and L 1x each with over pressure. No symptoms  Prone press-ups 1x. No symptoms   R hip supine quadrant movement. Reproduces symptoms. Eases with gentle traction.   hooklying gentle R hip distraction. Decreased R lateral and anterior hip symptoms initially then symptoms increased  hooklying gentle  R hip compression. Decreased R lateral hip symptoms.   Decreased R PSIS symptoms. No pain in sitting.   Standing forward weight shift onto R LE in front of the mirror 10x with cues for pelvic and femoral control 10x3 with 5 second holds   Supine transversus abdominis contraction, pelvic floor contraction, and posterior pelvic tilt with supine hip fallouts 10x, then 5x each LE. Difficulty with pelvic control with L hip fallout  Increased time secondary to emphasis on quality of movement.   Sit <> stand with activation of abdominal muscles. Decreased R PSIS symptoms.    Improved exercise technique, movement at target joints, use of target muscles after mod verbal, visual, tactile cues.       Pt demonstrates difficulty with pelvic and femoral control during forward weight shifting onto R LE. Mod to max verbal, visual, tactile cues to  correct. Decreased R PSIS symptoms with manual compression to R hip joint in hooklying. Pt also demonstrates difficulty with pelvic control with L hip fallout resulting in ipsilateral pelvic rotation. Cues needed to correct. Decreased R PSIS symptoms with <> stand with activation of abdominal muscles.             PT Long Term Goals - 09/01/16 1858      PT LONG TERM GOAL #1   Title Pt will have a decrease in R lateral hip and SI joint pain to 4/10 or less at worst to promote ability negotiate stairs, stand up after prologed sitting, and tolerate lying on her R side when sleeping.   Baseline 8/10 R lateral hip and SI joint pain at worst (08/04/2016); 4/10 R lateral hip and SI/PSIS joint pain at worst (09/01/2016)   Time 4   Period Weeks   Status On-going     PT LONG TERM GOAL #2   Title Patient will improve bilateral glute med and max strength by at least 1/2 MMT to promote ability to negotiate stairs with less pain.    Time 4   Period Weeks   Status On-going     PT LONG TERM GOAL #3   Title Patient will improve her LEFS score by at least 9 points as a demonstration of improved function.    Baseline 39/80 (08/04/2016); 47/80 (09/01/2016)   Time 4   Period Weeks   Status Partially Met               Plan - 09/03/16 1508    Clinical Impression Statement Pt demonstrates difficulty with pelvic and femoral control during forward weight shifting onto R LE. Mod to max verbal, visual, tactile cues to correct. Decreased R PSIS symptoms with manual compression to R hip joint in hooklying. Pt also demonstrates difficulty with pelvic control with L hip fallout resulting in ipsilateral pelvic rotation. Cues needed to correct. Decreased R PSIS symptoms with <> stand with activation of abdominal muscles.    Rehab Potential Good   Clinical Impairments Affecting Rehab Potential Chronicity of condition, bilateral knee pain   PT Frequency 2x / week   PT Duration 4 weeks   PT Treatment/Interventions  Manual techniques;Therapeutic activities;Therapeutic exercise;Dry needling;Patient/family education;Neuromuscular re-education;Ultrasound;Iontophoresis 66m/ml Dexamethasone;Electrical Stimulation;Aquatic Therapy  Modalities if appropriate   PT Next Visit Plan hip strengthening, femoral control, lumbopelvic strengthening and control   Consulted and Agree with Plan of Care Patient      Patient will benefit from skilled therapeutic intervention in order to improve the following deficits and impairments:  Pain, Improper body mechanics, Decreased strength  Visit Diagnosis:  Pain in right hip  Muscle weakness (generalized)     Problem List Patient Active Problem List   Diagnosis Date Noted  . Right hip pain 07/29/2016  . Osteoarthritis 04/02/2016  . Screening for cervical cancer 10/08/2015  . Pelvic pain in female 09/10/2015  . Midline thoracic back pain 08/15/2015  . Microscopic hematuria 05/08/2015  . Vertigo 10/27/2014  . Malignant neoplasm of breast (female) ([place]) 11/19/2012  . Routine physical examination 10/27/2012  . Screening for colon cancer 10/27/2012  . Hyperlipidemia 02/17/2011  . Osteopenia 02/17/2011    Joneen Boers PT, DPT   09/03/2016, 7:41 PM  [facility] [address] [place], [address] Phone: [phone]   Fax:  [phone]  Name: Thersa Mohiuddin MRN: [id number] Date of Birth: [birth year]

## 2016-09-08 ENCOUNTER — Ambulatory Visit: Payer: Medicare Other

## 2016-09-10 ENCOUNTER — Ambulatory Visit: Payer: Medicare Other

## 2016-09-10 DIAGNOSIS — M6281 Muscle weakness (generalized): Secondary | ICD-10-CM | POA: Diagnosis not present

## 2016-09-10 DIAGNOSIS — M25551 Pain in right hip: Secondary | ICD-10-CM | POA: Diagnosis not present

## 2016-09-10 NOTE — Patient Instructions (Addendum)
  ABDUCTION: Standing (Active)    Hold onto something sturdy for support. Stand, feet flat. Lift left leg out to side to pain-free range.   Keep your pelvis in neutral. Tighten your abdominal muscle.  Complete _3__ sets of __10_ repetitions. Perform __1_ sessions per day.  http://gtsc.exer.us/111   Copyright  VHI. All rights reserved.        Gave forward step up onto Air Ex Pad with R LE with L UE assist 10x with 5 second holds for 2 sessions daily. To be performed in front of a mirror, emphasis on pelvic and femoral control. Stop if symptoms worsen. Pt demonstrated and verbalized understanding.

## 2016-09-10 NOTE — Therapy (Signed)
[facility] [address] [place], [address] Phone: [phone]   Fax:  [phone]  Physical Therapy Treatment  Patient Details  Name: Deborah Mcconnell MRN: [id number] Date of Birth: May 03, 1951 Referring Provider: Burnard Hawthorne, FNP  Encounter Date: 09/10/2016      PT End of Session - 09/10/16 1605    Visit Number 8   Number of Visits 17   Date for PT Re-Evaluation 10/02/16   Authorization Type 8   Authorization Time Period of 10 g code   PT Start Time 1605   PT Stop Time 1646   PT Time Calculation (min) 41 min   Activity Tolerance Patient tolerated treatment well   Behavior During Therapy [facility] for tasks assessed/performed      Past Medical History:  Diagnosis Date  . Arthritis    osteoarthritis  . Cancer ([place])   . Hyperlipidemia   . Osteopenia    previously taking Evista and Actonel  . Osteoporosis   . Vertigo     Past Surgical History:  Procedure Laterality Date  . CHOLECYSTECTOMY    . lt breast lumpectomy  2014    There were no vitals filed for this visit.      Subjective Assessment - 09/10/16 1606    Subjective R hip symptoms feels better when she does not do anything. Feels like a 2/10 currently. Movement and weight bearing bothers her.  Feels so much better after exercises. Steps bother her.   The pain in her R lateral hip has improved. Does not bother her as much when she sleeps. Getting up after sitting for a while and steps bother her. Feels like a stiffness, I can't explain it. The home exercises help.    Pertinent History R hip pain. Pt states she is currently taking prednisone which relevied a lot of her R hip pain. Last dose was yesterday.  R hip pain began around November 2017. Felt like her pain was going to go away at that time but it continued. Pain is located from her R PSIS to R lateral hip and proximal lateral thigh. Feels like her pain started at her R PSIS.  Difficulty lying on her R side (feels really sore).  Feels a burning sensation in her R sacroiliac area and a steady ache in her R hip (greater trochanter area).    Patient Stated Goals Go back to walking up stairs with less pain, be able to sleep better.    Currently in Pain? Yes   Pain Score 2    Pain Onset More than a month ago                                 PT Education - 09/10/16 1628    Education provided Yes   Education Details ther-ex, HEP   Person(s) Educated Patient   Methods Explanation;Demonstration;Tactile cues;Verbal cues;Handout   Comprehension Verbalized understanding;Returned demonstration        Objectives  There-ex  Sit <> stand from mat table 2x  Then with red band resisting hip abduction/ER 5x3 with emphasis on femoral control  R lateral thigh discomfort at first which eased with repetition. No PSIS symptoms  Standing hip machine: R hip extension plate 25 for 32D,   Then plate 40 for 92E2, with PT assist to decrease lumbar extension compensation to promote glute max strengthening    Standing forward weight shift  onto R LE in front of the mirror 10x3 with 5 second holds with cues for pelvic and femoral control  Then with forward step up onto Air Ex pad 10x2 with 5 second holds  Forward step up onto 3 inch step in front of mirror and L UE assist 5x. Slight R lateral hip discomfort.   Standing bilateral shoulder extension resisting red band 10x5 seconds    Standing L hip abduction with bilateral UE assist 10x3 with emphasis on R glute med isometric muscle use for pelvic control   Reviewed HEP. Pt demonstrated and verbalized understanding.    Improved exercise technique, movement at target joints, use of target muscles after mod verbal, visual, tactile cues.     Continued working on femoral and pelvic control with closed chain activities to decrease pressure to R lateral hip in addition to cues to increase use of abdominal  muscles to help decrease R PSIS symptoms. Working towards gradually stepping up with R LE from forward weight shifting onto R LE, then onto Air Ex pad, then stepping up onto 3 inch step. R lateral hip discomfort when stepping up onto 3 inch step which eased with rest.         PT Long Term Goals - 09/01/16 1858      PT LONG TERM GOAL #1   Title Pt will have a decrease in R lateral hip and SI joint pain to 4/10 or less at worst to promote ability negotiate stairs, stand up after prologed sitting, and tolerate lying on her R side when sleeping.   Baseline 8/10 R lateral hip and SI joint pain at worst (08/04/2016); 4/10 R lateral hip and SI/PSIS joint pain at worst (09/01/2016)   Time 4   Period Weeks   Status On-going     PT LONG TERM GOAL #2   Title Patient will improve bilateral glute med and max strength by at least 1/2 MMT to promote ability to negotiate stairs with less pain.    Time 4   Period Weeks   Status On-going     PT LONG TERM GOAL #3   Title Patient will improve her LEFS score by at least 9 points as a demonstration of improved function.    Baseline 39/80 (08/04/2016); 47/80 (09/01/2016)   Time 4   Period Weeks   Status Partially Met               Plan - 09/10/16 1629    Clinical Impression Statement Continued working on femoral and pelvic control with closed chain activities to decrease pressure to R lateral hip in addition to cues to increase use of abdominal muscles to help decrease R PSIS symptoms. Working towards gradually stepping up with R LE from forward weight shifting onto R LE, then onto Air Ex pad, then stepping up onto 3 inch step. R lateral hip discomfort when stepping up onto 3 inch step which eased with rest.    Rehab Potential Good   Clinical Impairments Affecting Rehab Potential Chronicity of condition, bilateral knee pain   PT Frequency 2x / week   PT Duration 4 weeks   PT Treatment/Interventions Manual techniques;Therapeutic activities;Therapeutic  exercise;Dry needling;Patient/family education;Neuromuscular re-education;Ultrasound;Iontophoresis 74m/ml Dexamethasone;Electrical Stimulation;Aquatic Therapy  Modalities if appropriate   PT Next Visit Plan hip strengthening, femoral control, lumbopelvic strengthening and control   Consulted and Agree with Plan of Care Patient      Patient will benefit from skilled therapeutic intervention in order to improve the following deficits and impairments:  Pain, Improper body mechanics, Decreased strength  Visit Diagnosis: Pain in right hip  Muscle weakness (generalized)     Problem List Patient Active Problem List   Diagnosis Date Noted  . Right hip pain 07/29/2016  . Osteoarthritis 04/02/2016  . Screening for cervical cancer 10/08/2015  . Pelvic pain in female 09/10/2015  . Midline thoracic back pain 08/15/2015  . Microscopic hematuria 05/08/2015  . Vertigo 10/27/2014  . Malignant neoplasm of breast (female) ([place]) 11/19/2012  . Routine physical examination 10/27/2012  . Screening for colon cancer 10/27/2012  . Hyperlipidemia 02/17/2011  . Osteopenia 02/17/2011   Joneen Boers PT, DPT   09/10/2016, 7:09 PM   [facility] [address] [place], [address] Phone: [phone]   Fax:  [phone]  Name: Indira Sorenson MRN: [id number] Date of Birth: Jul 21, 1950

## 2016-09-15 ENCOUNTER — Ambulatory Visit: Payer: Medicare Other

## 2016-09-15 DIAGNOSIS — M25551 Pain in right hip: Secondary | ICD-10-CM

## 2016-09-15 DIAGNOSIS — M6281 Muscle weakness (generalized): Secondary | ICD-10-CM

## 2016-09-15 NOTE — Therapy (Signed)
[facility] [address], [place], [address] Phone: [phone]   Fax:  [phone]  Physical Therapy Treatment  Patient Details  Name: Deborah Mcconnell MRN: [id number] Date of Birth: 02/10/1951 Referring Provider: Burnard Hawthorne, FNP  Encounter Date: 09/15/2016      PT End of Session - 09/15/16 1559    Visit Number 9   Number of Visits 17   Date for PT Re-Evaluation 10/02/16   Authorization Type 9   Authorization Time Period of 10 g code   PT Start Time 1559   PT Stop Time 1646   PT Time Calculation (min) 47 min   Activity Tolerance Patient tolerated treatment well   Behavior During Therapy [facility] for tasks assessed/performed      Past Medical History:  Diagnosis Date  . Arthritis    osteoarthritis  . Cancer ([place])   . Hyperlipidemia   . Osteopenia    previously taking Evista and Actonel  . Osteoporosis   . Vertigo     Past Surgical History:  Procedure Laterality Date  . CHOLECYSTECTOMY    . lt breast lumpectomy  2014    There were no vitals filed for this visit.      Subjective Assessment - 09/15/16 1601    Subjective Was not able to do her exercises this weekend secondary to being out of town.  Went to the mountains in [place] this past weekend.  R PSIS is 2-3/10 currently (5-6/10 when getting up after sitting for a while, fine after movement). Rolling over is not easy because of her symptoms. The R lateral hip only bothers her when she goes up the steps or rolls over on her R side. 0/10 R lateral hip pain currently (4/10 at most for the past 7 days not incliuding steps; 6/10 at most with steps).    Pertinent History R hip pain. Pt states she is currently taking prednisone which relevied a lot of her R hip pain. Last dose was yesterday.  R hip pain began around November 2017. Felt like her pain was going to go away at that time but it continued. Pain is located from her R PSIS to R  lateral hip and proximal lateral thigh. Feels like her pain started at her R PSIS. Difficulty lying on her R side (feels really sore).  Feels a burning sensation in her R sacroiliac area and a steady ache in her R hip (greater trochanter area).    Patient Stated Goals Go back to walking up stairs with less pain, be able to sleep better.    Currently in Pain? Yes   Pain Score 3    Pain Onset More than a month ago                                 PT Education - 09/15/16 1619    Education provided Yes   Education Details ther-ex   [employer]) Educated Patient   Methods Explanation;Demonstration;Tactile cues;Verbal cues   Comprehension Verbalized understanding;Returned demonstration        Objectives  There-ex  Seated trunk flexion isometrics with manual resistance from PT while pt using PVC rod 10x with 5 seconds. Pt states feeling more work in low back  Then resisting shoulder extension isometrics 10x3 with 5 seconds    Increased abdominal muscle use. Decreased R low back symptoms  Seated R hip extension isometrics 10x5  seconds for 3 sets  Standing hip machine: hip extension plate 40 for 75I4 for glute max strengthening   TRX mini lunge 10x3, emphasis on femoral control  Standing bilateral shoulder extension resisting plate 10 for 33I9 seconds   Then plate 15 for 51O8 seconds to promote trunk muscle use  Total gym single leg squats height 16 for 10x3, emphasis on femoral control. Good glute max muscle use felt  Single leg stands on R LE with emphasis on pelvic control, bilateral UE and L tip toe assist (R glute med muscle use felt without hip pain with addition of L tip to assist)   Improved exercise technique, movement at target joints, use of target muscles after mod verbal, visual, tactile cues.     Good glute max, glue med and trunk muscle use felt with exercises. No pain at end of session, mainly a feeling of work at her PSIS and R lateral hip.          PT Long Term Goals - 09/01/16 1858      PT LONG TERM GOAL #1   Title Pt will have a decrease in R lateral hip and SI joint pain to 4/10 or less at worst to promote ability negotiate stairs, stand up after prologed sitting, and tolerate lying on her R side when sleeping.   Baseline 8/10 R lateral hip and SI joint pain at worst (08/04/2016); 4/10 R lateral hip and SI/PSIS joint pain at worst (09/01/2016)   Time 4   Period Weeks   Status On-going     PT LONG TERM GOAL #2   Title Patient will improve bilateral glute med and max strength by at least 1/2 MMT to promote ability to negotiate stairs with less pain.    Time 4   Period Weeks   Status On-going     PT LONG TERM GOAL #3   Title Patient will improve her LEFS score by at least 9 points as a demonstration of improved function.    Baseline 39/80 (08/04/2016); 47/80 (09/01/2016)   Time 4   Period Weeks   Status Partially Met               Plan - 09/15/16 1558    Clinical Impression Statement Good glute max, glue med and trunk muscle use felt with exercises. No pain at end of session, mainly a feeling of work at her PSIS and R lateral hip.    Rehab Potential Good   Clinical Impairments Affecting Rehab Potential Chronicity of condition, bilateral knee pain   PT Frequency 2x / week   PT Duration 4 weeks   PT Treatment/Interventions Manual techniques;Therapeutic activities;Therapeutic exercise;Dry needling;Patient/family education;Neuromuscular re-education;Ultrasound;Iontophoresis 4m/ml Dexamethasone;Electrical Stimulation;Aquatic Therapy  Modalities if appropriate   PT Next Visit Plan hip strengthening, femoral control, lumbopelvic strengthening and control   Consulted and Agree with Plan of Care Patient      Patient will benefit from skilled therapeutic intervention in order to improve the following deficits and impairments:  Pain, Improper body mechanics, Decreased strength  Visit Diagnosis: Pain in right  hip  Muscle weakness (generalized)     Problem List Patient Active Problem List   Diagnosis Date Noted  . Right hip pain 07/29/2016  . Osteoarthritis 04/02/2016  . Screening for cervical cancer 10/08/2015  . Pelvic pain in female 09/10/2015  . Midline thoracic back pain 08/15/2015  . Microscopic hematuria 05/08/2015  . Vertigo 10/27/2014  . [place] 11/19/2012  . Routine physical examination  10/27/2012  . Screening for colon cancer 10/27/2012  . Hyperlipidemia 02/17/2011  . Osteopenia 02/17/2011    Joneen Boers PT, DPT   09/15/2016, 6:36 PM  [facility] [address], [place], [address] Phone: [phone]   Fax:  [phone]  Name: Deborah Mcconnell MRN: [id number] Date of Birth: [birth year]

## 2016-09-17 ENCOUNTER — Ambulatory Visit: Payer: Medicare Other

## 2016-09-17 DIAGNOSIS — M6281 Muscle weakness (generalized): Secondary | ICD-10-CM | POA: Diagnosis not present

## 2016-09-17 DIAGNOSIS — M25551 Pain in right hip: Secondary | ICD-10-CM | POA: Diagnosis not present

## 2016-09-17 NOTE — Patient Instructions (Signed)
  Seated bilateral shoulder extension isometrics, hands on thighs with glute max squeeze and posterior pelvic tilting reviewed and given as part of her HEP 10x3 with 5 second holds daily. Pt demonstrated and verbalized undrstanding.

## 2016-09-17 NOTE — Therapy (Signed)
[facility] [address] [place], [address] Phone: [phone]   Fax:  [phone]  Physical Therapy Treatment And Progress Report  Patient Details  Name: Deborah Mcconnell MRN: [id number] Date of Birth: 27-Jun-1951 Referring Provider: Burnard Hawthorne, FNP  Encounter Date: 09/17/2016      PT End of Session - 09/17/16 1604    Visit Number 10   Number of Visits 17   Date for PT Re-Evaluation 10/02/16   Authorization Type 1   Authorization Time Period of 10 g code   PT Start Time 1604   PT Stop Time 1646   PT Time Calculation (min) 42 min   Activity Tolerance Patient tolerated treatment well   Behavior During Therapy [facility] for tasks assessed/performed      Past Medical History:  Diagnosis Date  . Arthritis    osteoarthritis  . Cancer ([place])   . Hyperlipidemia   . Osteopenia    previously taking Evista and Actonel  . Osteoporosis   . Vertigo     Past Surgical History:  Procedure Laterality Date  . CHOLECYSTECTOMY    . lt breast lumpectomy  2014    There were no vitals filed for this visit.      Subjective Assessment - 09/17/16 1605    Subjective The last 24 hours has been fabulous. Was sore (work out sore) yesterday when she woke up. Feels better when getting up the stairs.  2/10 R PSIS and R lateral hip currently. Soo much improved.     Pertinent History R hip pain. Pt states she is currently taking prednisone which relevied a lot of her R hip pain. Last dose was yesterday.  R hip pain began around November 2017. Felt like her pain was going to go away at that time but it continued. Pain is located from her R PSIS to R lateral hip and proximal lateral thigh. Feels like her pain started at her R PSIS. Difficulty lying on her R side (feels really sore).  Feels a burning sensation in her R sacroiliac area and a steady ache in her R hip (greater trochanter area).    Patient Stated Goals Go back  to walking up stairs with less pain, be able to sleep better.    Currently in Pain? Yes   Pain Score 2    Pain Onset More than a month ago            [facility] PT Assessment - 09/17/16 1610      Observation/Other Assessments   Lower Extremity Functional Scale  56/80     Strength   Right Hip Extension 4/5   Right Hip ABduction 4/5   Left Hip Extension 4+/5   Left Hip ABduction 4/5                             PT Education - 09/17/16 1803    Education provided Yes   Education Details ther-ex, progress/current status with hip strength, HEP   Person(s) Educated Patient   Methods Explanation;Demonstration;Tactile cues;Verbal cues   Comprehension Verbalized understanding;Returned demonstration         Objectives  There-ex  Manually resisted prone hip extension, S/L hip abduction 1x each way for each LE  Reviewed progress/current status with hip strength with pt.   Seated bilateral shoulder extension isometrics with manual resistance from PT while pt using PVC rod 10x5 seconds for 3 sets  to promote abdominal muscle use    Standing hip machine: hip extension plate 40 for 16X0 for glute max strengthening    Single leg stands on R LE with emphasis on pelvic control, bilateral UE and L tip toe assist (R glute med muscle use felt without hip pain with addition of L tip toe assist) 10x5 seconds for 2 sets   Total gym single leg squats height 16 for 10x3, emphasis on femoral control.  Seated bilateral shoulder extension isometrics, hands on thighs with glute max squeeze and posterior pelvic tilting 10x3 with 5 second holds  Reviewed and given as part of her HEP 10x3 with 5 second holds daily as part of her HEP. Pt demonstrated and verbalized undrstanding.    TRX mini lunge 1x. L knee discomfort today.  Eases with sitting rest.   Seated R hip ER 10x2 with glute max squeeze     Improved exercise technique, movement at target joints, use of target muscles  after mod verbal, visual, tactile cues.     Continued working on abdominial and R glute max strengthening to decrease pain in R PSIS as well as glute med strengthening and femoral and pelvic control to decrease R lateral hip and R PSIS symptoms.     Pt demonstrates decreased R PSIS and R lateral hip pain with exercises focusing on glute max, glute med and abdominal muscle use, pelvic, and femoral control. Good carry over of decreased R PSIS and lateral hip symptoms from previous session. Pt also demonstrates overall improved hip strength since initial evaluation. Pt still has R PSIS and lateral hip pain, difficulty with pelvic and femoral control (but improving), and difficulty performing functional tasks such as stair negotiation and would benefit from continued skilled physical therapy services to address the aforementioned deficits.              PT Long Term Goals - 09/17/16 1614      PT LONG TERM GOAL #1   Title Pt will have a decrease in R lateral hip and SI joint pain to 4/10 or less at worst to promote ability negotiate stairs, stand up after prologed sitting, and tolerate lying on her R side when sleeping.   Baseline 8/10 R lateral hip and SI joint pain at worst (08/04/2016); 4/10 R lateral hip and SI/PSIS joint pain at worst (09/01/2016); 6/10 R PSIS and R lateral hip pain at worst for the past 7 days (09/17/2016)   Time 4   Period Weeks   Status On-going     PT LONG TERM GOAL #2   Title Patient will improve bilateral glute med and max strength by at least 1/2 MMT to promote ability to negotiate stairs with less pain.    Time 4   Period Weeks   Status Partially Met     PT LONG TERM GOAL #3   Title Patient will improve her LEFS score by at least 9 points as a demonstration of improved function.    Baseline 39/80 (08/04/2016); 47/80 (09/01/2016); 56/80 (09/17/2016)   Time 4   Period Weeks   Status Achieved               Plan - 09/17/16 1633    Clinical Impression  Statement Pt demonstrates decreased R PSIS and R lateral hip pain with exercises focusing on glute max, glute med and abdominal muscle use, pelvic, and femoral control. Good carry over of decreased R PSIS and lateral hip symptoms from previous session. Pt also demonstrates overall  improved hip strength since initial evaluation. Pt still has R PSIS and lateral hip pain, difficulty with pelvic and femoral control (but improving), and difficulty performing functional tasks such as stair negotiation and would benefit from continued skilled physical therapy services to address the aforementioned deficits.    Rehab Potential Good   Clinical Impairments Affecting Rehab Potential Chronicity of condition, bilateral knee pain   PT Frequency 2x / week   PT Duration 4 weeks   PT Treatment/Interventions Manual techniques;Therapeutic activities;Therapeutic exercise;Dry needling;Patient/family education;Neuromuscular re-education;Ultrasound;Iontophoresis 49m/ml Dexamethasone;Electrical Stimulation;Aquatic Therapy  Modalities if appropriate   PT Next Visit Plan hip strengthening, femoral control, lumbopelvic strengthening and control   Consulted and Agree with Plan of Care Patient      Patient will benefit from skilled therapeutic intervention in order to improve the following deficits and impairments:  Pain, Improper body mechanics, Decreased strength  Visit Diagnosis: Pain in right hip  Muscle weakness (generalized)       G-Codes - 0April 05, 20181816    Functional Assessment Tool Used (Outpatient Only) LEFS, clinical presentation, patient interview   Functional Limitation Mobility: Walking and moving around   Mobility: Walking and Moving Around Current Status (([id number] At least 20 percent but less than 40 percent impaired, limited or restricted   Mobility: Walking and Moving Around Goal Status [phone] At least 1 percent but less than 20 percent impaired, limited or restricted  pt demonstrates potential for  improvement      Problem List Patient Active Problem List   Diagnosis Date Noted  . Right hip pain 07/29/2016  . Osteoarthritis 04/02/2016  . Screening for cervical cancer 10/08/2015  . Pelvic pain in female 09/10/2015  . Midline thoracic back pain 08/15/2015  . Microscopic hematuria 05/08/2015  . Vertigo 10/27/2014  . [place] 11/19/2012  . Routine physical examination 10/27/2012  . Screening for colon cancer 10/27/2012  . Hyperlipidemia 02/17/2011  . Osteopenia 02/17/2011    Thank you for your referral.    MJoneen BoersPT, DPT   32018/04/05 6:19 PM  [facility] [address] [place] [address]Phone: [phone]  Fax:  [phone] Name: LLasharn BufkinMRN: [id number] of Birth: [birth year]

## 2016-09-22 ENCOUNTER — Ambulatory Visit: Payer: Medicare Other

## 2016-09-22 DIAGNOSIS — M25551 Pain in right hip: Secondary | ICD-10-CM | POA: Diagnosis not present

## 2016-09-22 DIAGNOSIS — M6281 Muscle weakness (generalized): Secondary | ICD-10-CM | POA: Diagnosis not present

## 2016-09-22 NOTE — Therapy (Signed)
[facility] [address] [place], [address] Phone: [phone]   Fax:  [phone]  Physical Therapy Treatment  Patient Details  Name: Deborah Mcconnell MRN: [id number] Date of Birth: 1950-10-02 Referring Provider: Burnard Hawthorne, FNP  Encounter Date: 09/22/2016      PT End of Session - 09/22/16 1611    Visit Number 11   Number of Visits 17   Date for PT Re-Evaluation 10/02/16   Authorization Type 2   Authorization Time Period of 10 g code   PT Start Time 1611  pt arrived late   PT Stop Time 1647   PT Time Calculation (min) 36 min   Activity Tolerance Patient tolerated treatment well   Behavior During Therapy [facility] for tasks assessed/performed      Past Medical History:  Diagnosis Date  . Arthritis    osteoarthritis  . Cancer ([place])   . Hyperlipidemia   . Osteopenia    previously taking Evista and Actonel  . Osteoporosis   . Vertigo     Past Surgical History:  Procedure Laterality Date  . CHOLECYSTECTOMY    . lt breast lumpectomy  2014    There were no vitals filed for this visit.      Subjective Assessment - 09/22/16 1613    Subjective L hip and low back feels good just sitting. Hurts when she gets out of a car, or climbs steps. No pain in sitting. Sleeping better.  No pain currently.   5/10  at most for the past 7 days (stair negotiation)    Pertinent History R hip pain. Pt states she is currently taking prednisone which relevied a lot of her R hip pain. Last dose was yesterday.  R hip pain began around November 2017. Felt like her pain was going to go away at that time but it continued. Pain is located from her R PSIS to R lateral hip and proximal lateral thigh. Feels like her pain started at her R PSIS. Difficulty lying on her R side (feels really sore).  Feels a burning sensation in her R sacroiliac area and a steady ache in her R hip (greater trochanter area).    Patient Stated  Goals Go back to walking up stairs with less pain, be able to sleep better.    Currently in Pain? No/denies   Pain Score 0-No pain   Pain Onset More than a month ago                                 PT Education - 09/22/16 1628    Education provided Yes   Education Details ther-ex, HEP, plan of care   Person(s) Educated Patient   Methods Demonstration;Tactile cues;Explanation;Verbal cues;Handout   Comprehension Verbalized understanding;Returned demonstration        Objectives  There-ex  Seated R hip IR isometrics 40% effort 1 min x 5 with 1 min rest breaks   Single leg stands on R LE with emphasis on pelvic control, bilateral UE and L tip toe assist 10x5 seconds for 2 sets  Seated bilateral shoulder extension isometrics, hands on thighs with glute max squeeze and posterior pelvic tilting 10x5 seconds    Total gym single leg squats height 16 for 10x3, emphasis on femoral control.   Standing hip machine: hip extension plate 40 for 47M5 for glute max strengthening   Reviewed plan of care: 2x/week  for 4 weeks after next week.    Improved exercise technique, movement at target joints, use of target muscles after min to mod verbal, visual, tactile cues.    Pt demonstrates very good carry over of decreased resting hip and PSIS pain from one session to the next. Gradually working on increasing load tolerance to L glute med muscle and tendon. No complain of increased R PSIS and R lateral hip pain during session.           PT Long Term Goals - 09/17/16 1614      PT LONG TERM GOAL #1   Title Pt will have a decrease in R lateral hip and SI joint pain to 4/10 or less at worst to promote ability negotiate stairs, stand up after prologed sitting, and tolerate lying on her R side when sleeping.   Baseline 8/10 R lateral hip and SI joint pain at worst (08/04/2016); 4/10 R lateral hip and SI/PSIS joint pain at worst (09/01/2016); 6/10 R PSIS and R lateral hip  pain at worst for the past 7 days (09/17/2016)   Time 4   Period Weeks   Status On-going     PT LONG TERM GOAL #2   Title Patient will improve bilateral glute med and max strength by at least 1/2 MMT to promote ability to negotiate stairs with less pain.    Time 4   Period Weeks   Status Partially Met     PT LONG TERM GOAL #3   Title Patient will improve her LEFS score by at least 9 points as a demonstration of improved function.    Baseline 39/80 (08/04/2016); 47/80 (09/01/2016); 56/80 (09/17/2016)   Time 4   Period Weeks   Status Achieved               Plan - 09/22/16 1642    Clinical Impression Statement Pt demonstrates very good carry over of decreased resting hip and PSIS pain from one session to the next. Gradually working on increasing load tolerance to L glute med muscle and tendon. No complain of increased R PSIS and R lateral hip pain during session.    Rehab Potential Good   Clinical Impairments Affecting Rehab Potential Chronicity of condition, bilateral knee pain   PT Frequency 2x / week   PT Duration 4 weeks   PT Treatment/Interventions Manual techniques;Therapeutic activities;Therapeutic exercise;Dry needling;Patient/family education;Neuromuscular re-education;Ultrasound;Iontophoresis 58m/ml Dexamethasone;Electrical Stimulation;Aquatic Therapy  Modalities if appropriate   PT Next Visit Plan hip strengthening, femoral control, lumbopelvic strengthening and control   Consulted and Agree with Plan of Care Patient      Patient will benefit from skilled therapeutic intervention in order to improve the following deficits and impairments:  Pain, Improper body mechanics, Decreased strength  Visit Diagnosis: Pain in right hip  Muscle weakness (generalized)     Problem List Patient Active Problem List   Diagnosis Date Noted  . Right hip pain 07/29/2016  . Osteoarthritis 04/02/2016  . Screening for cervical cancer 10/08/2015  . Pelvic pain in female 09/10/2015   . Midline thoracic back pain 08/15/2015  . Microscopic hematuria 05/08/2015  . Vertigo 10/27/2014  . Malignant ne[place] 11/19/2012  . Routine physical examination 10/27/2012  . Screening for colon cancer 10/27/2012  . Hyperlipidemia 02/17/2011  . Osteopenia 02/17/2011   MJoneen BoersPT, DPT   09/22/2016, 4:54 PM  [facility] [address] [place] [address]Phone: [phone]  Fax:  [phone]  Name: Deborah Mcconnell MRN: [id number] Date of Birth: 02/26/1951

## 2016-09-22 NOTE — Patient Instructions (Signed)
    Sitting on a chair   Thighs shoulder width apart    Press your right foot out to the side against the chair leg.     40 % effort for 1 minute   Rest for 1-2 minutes   Perform 5 times per set.     Do 3 - 5 sets per day. Each set to be performed a few hours apart.

## 2016-09-24 ENCOUNTER — Ambulatory Visit: Payer: Medicare Other

## 2016-09-24 DIAGNOSIS — M6281 Muscle weakness (generalized): Secondary | ICD-10-CM

## 2016-09-24 DIAGNOSIS — M25551 Pain in right hip: Secondary | ICD-10-CM | POA: Diagnosis not present

## 2016-09-24 NOTE — Therapy (Signed)
[facility] [address] [place], [address] Phone: [phone]   Fax:  [phone]  Physical Therapy Treatment  Patient Details  Name: Deborah Mcconnell MRN: [id number] Date of Birth: 19-Oct-1950 Referring Provider: Burnard Hawthorne, FNP  Encounter Date: 09/24/2016      PT End of Session - 09/24/16 1609    Visit Number 12   Number of Visits 17   Date for PT Re-Evaluation 10/02/16   Authorization Type 3   Authorization Time Period of 10 g code   PT Start Time 0712   PT Stop Time 1655   PT Time Calculation (min) 46 min   Activity Tolerance Patient tolerated treatment well   Behavior During Therapy [facility] for tasks assessed/performed      Past Medical History:  Diagnosis Date  . Arthritis    osteoarthritis  . Cancer ([place])   . Hyperlipidemia   . Osteopenia    previously taking Evista and Actonel  . Osteoporosis   . Vertigo     Past Surgical History:  Procedure Laterality Date  . CHOLECYSTECTOMY    . lt breast lumpectomy  2014    There were no vitals filed for this visit.      Subjective Assessment - 09/24/16 1610    Subjective R hip feels a little sore today and I don't know why. Does not feel good as it did Monday.  Was sore yesterday morning but the soreness did not let up.  The R PSIS is not bothering her.  3-4/10 R hip soreness currently. No R PSIS pain.  Might have slept on her R side the wrong way.  Pt has also been a little more active the last couple of days.    Pertinent History R hip pain. Pt states she is currently taking prednisone which relevied a lot of her R hip pain. Last dose was yesterday.  R hip pain began around November 2017. Felt like her pain was going to go away at that time but it continued. Pain is located from her R PSIS to R lateral hip and proximal lateral thigh. Feels like her pain started at her R PSIS. Difficulty lying on her R side (feels really sore).  Feels a  burning sensation in her R sacroiliac area and a steady ache in her R hip (greater trochanter area).    Patient Stated Goals Go back to walking up stairs with less pain, be able to sleep better.    Currently in Pain? Yes   Pain Score 4    Pain Onset More than a month ago                                 PT Education - 09/24/16 1617    Education provided Yes   Education Details ther-ex   [employer]) Educated Patient   Methods Explanation;Demonstration;Tactile cues;Verbal cues   Comprehension Verbalized understanding;Returned demonstration        Objectives  Pain: R lateral hip and around L1 and L 2 dermatome area   There-ex  Seated R hip IR isometrics 40% effort 1 min x 5 with 1 min rest breaks  Seated R hip ER 10x3 with glute max squeeze   Standing hip machine: hip extension plate 40 for 19X5 for glute max strengthening   Decreased R lateral hip soreness.   Single leg stands on R LE with emphasis on pelvic  control, bilateral UE and L tip toe assist 10x5 seconds for 3 sets  Slight increased R lateral hip soreness after addition of 3rd set today  Seated hip adduction ball squeeze (25 cm physioball) 10x5 seconds with glute max squeeze     Seated bilateral shoulder extension isometrics with manual resistance from PT while pt using PVC rod 10x5 seconds for 3 sets to promote abdominal muscle use. Decreased anterior lateral thigh (around L1 and L2 dermatome) discomfort.   Sit <> stand with femoral control 5x secondary to pt observed to perform transfers with bilateral femoral IR and adduction.   Pt education to continue to keep her R thigh from adducting throughout the day to help decrease pressure to R lateral thigh as best as she can. Pt verbalized understanding.   Might try high volt E-stim and/or hold off seated R hip IR isometrics with thigh at neutral and return to her seated clamshell isometric HEP (hips less than 90 degrees flexion) if R lateral  hip soreness increases.      Improved exercise technique, movement at target joints, use of target muscles after min to mod verbal, visual, tactile cues.     Decreased soreness to R lateral hip to 2-3/10 after session, especially after activation of R glute max muscle. No complain of R PSIS pain. Decreased R anterolateral hip symptoms (around L1 and L2 dermatome) with activation of abdominal muscles.          PT Long Term Goals - 09/17/16 1614      PT LONG TERM GOAL #1   Title Pt will have a decrease in R lateral hip and SI joint pain to 4/10 or less at worst to promote ability negotiate stairs, stand up after prologed sitting, and tolerate lying on her R side when sleeping.   Baseline 8/10 R lateral hip and SI joint pain at worst (08/04/2016); 4/10 R lateral hip and SI/PSIS joint pain at worst (09/01/2016); 6/10 R PSIS and R lateral hip pain at worst for the past 7 days (09/17/2016)   Time 4   Period Weeks   Status On-going     PT LONG TERM GOAL #2   Title Patient will improve bilateral glute med and max strength by at least 1/2 MMT to promote ability to negotiate stairs with less pain.    Time 4   Period Weeks   Status Partially Met     PT LONG TERM GOAL #3   Title Patient will improve her LEFS score by at least 9 points as a demonstration of improved function.    Baseline 39/80 (08/04/2016); 47/80 (09/01/2016); 56/80 (09/17/2016)   Time 4   Period Weeks   Status Achieved               Plan - 09/24/16 1617    Clinical Impression Statement Decreased soreness to R lateral hip to 2-3/10 after session, especially after activation of R glute max muscle. No complain of R PSIS pain. Decreased R anterolateral hip symptoms (around L1 and L2 dermatome) with activation of abdominal muscles.    Rehab Potential Good   Clinical Impairments Affecting Rehab Potential Chronicity of condition, bilateral knee pain   PT Frequency 2x / week   PT Duration 4 weeks   PT Treatment/Interventions  Manual techniques;Therapeutic activities;Therapeutic exercise;Dry needling;Patient/family education;Neuromuscular re-education;Ultrasound;Iontophoresis 39m/ml Dexamethasone;Electrical Stimulation;Aquatic Therapy  Modalities if appropriate   PT Next Visit Plan hip strengthening, femoral control, lumbopelvic strengthening and control   Consulted and Agree with Plan of Care Patient  Patient will benefit from skilled therapeutic intervention in order to improve the following deficits and impairments:  Pain, Improper body mechanics, Decreased strength  Visit Diagnosis: Pain in right hip  Muscle weakness (generalized)     Problem List Patient Active Problem List   Diagnosis Date Noted  . Right hip pain 07/29/2016  . Osteoarthritis 04/02/2016  . Screening for cervical cancer 10/08/2015  . Pelvic pain in female 09/10/2015  . Midline thoracic back pain 08/15/2015  . Microscopic hematuria 05/08/2015  . Vertigo 10/27/2014  . Malignant neoplasm of breast (female) ([place]) 11/19/2012  . Routine physical examination 10/27/2012  . Screening for colon cancer 10/27/2012  . Hyperlipidemia 02/17/2011  . Osteopenia 02/17/2011    Joneen Boers PT, DPT   09/24/2016, 5:07 PM  [place] [facility] [address], [place], [address] Phone: [phone]   Fax:  [phone]  Name: Zilah Villaflor MRN: [id number] Date of Birth: [birth year]

## 2016-09-30 ENCOUNTER — Ambulatory Visit: Payer: Medicare Other | Attending: Family

## 2016-09-30 DIAGNOSIS — M6281 Muscle weakness (generalized): Secondary | ICD-10-CM | POA: Diagnosis not present

## 2016-09-30 DIAGNOSIS — M25551 Pain in right hip: Secondary | ICD-10-CM | POA: Insufficient documentation

## 2016-09-30 NOTE — Therapy (Signed)
[facility] [address], [place], [address] Phone: [phone]   Fax:  [phone]  Physical Therapy Treatment  Patient Details  Name: Deborah Mcconnell MRN: [id number] Date of Birth: 10-31-1950 Referring Provider: Burnard Hawthorne, FNP  Encounter Date: 09/30/2016      PT End of Session - 09/30/16 0746    Visit Number 13   Number of Visits 25   Date for PT Re-Evaluation 10/30/16   Authorization Type 4   Authorization Time Period of 10 g code   PT Start Time 0746   PT Stop Time 0830   PT Time Calculation (min) 44 min   Activity Tolerance Patient tolerated treatment well   Behavior During Therapy [facility] for tasks assessed/performed      Past Medical History:  Diagnosis Date  . Arthritis    osteoarthritis  . Cancer ([place])   . Hyperlipidemia   . Osteopenia    previously taking Evista and Actonel  . Osteoporosis   . Vertigo     Past Surgical History:  Procedure Laterality Date  . CHOLECYSTECTOMY    . lt breast lumpectomy  2014    There were no vitals filed for this visit.      Subjective Assessment - 09/30/16 0747    Subjective R hip feels the best in the morning. 3/10 R PSIS and 2/10 R lateral hip this morning. Woke up this morning when she rolled over to her R side. Had to go up steps this weekend due to family member taking her downstairs bedroom.  4/10 R lateral hip and 3/10 R PSIS at most for the past 7 days. Did a lot of walking this weekend and it did not bother her R side much.  Pt also states having a home over the counter TENS unit.    Pertinent History R hip pain. Pt states she is currently taking prednisone which relevied a lot of her R hip pain. Last dose was yesterday.  R hip pain began around November 2017. Felt like her pain was going to go away at that time but it continued. Pain is located from her R PSIS to R lateral hip and proximal lateral thigh. Feels like her pain started at  her R PSIS. Difficulty lying on her R side (feels really sore).  Feels a burning sensation in her R sacroiliac area and a steady ache in her R hip (greater trochanter area).    Patient Stated Goals Go back to walking up stairs with less pain, be able to sleep better.    Currently in Pain? Yes   Pain Score 3    Pain Onset More than a month ago            [facility] PT Assessment - 09/30/16 2123      Strength   Overall Strength Comments Measured on 09/17/2016   Right Hip Extension 4/5   Right Hip ABduction 4/5   Left Hip Extension 4+/5   Left Hip ABduction 4/5                             PT Education - 09/30/16 0752    Education provided Yes   Education Details ther-ex   Person(s) Educated Patient   Methods Explanation;Demonstration;Tactile cues;Verbal cues   Comprehension Verbalized understanding;Returned demonstration        Objectives  Pt states having an over the counter TENS unit at home  There-ex  Seated R hip ER 10x3 with 5 second holds with glute max squeeze   Seated bilateral shoulder extension isometrics with manual resistance from PT while pt using PVC rod 10x5 seconds for 3 sets to promote abdominal muscle use.  Standing hip machine: hip extension plate 40 for 67M0 for glute max strengthening                Total gym single leg squats height 16 for 10x3, emphasis on femoral control.  Forward step up with R LE and with L UE assist, in front of the mirror for visual cues for pelvic and femoral control:  3 inch step 4x. R lateral hip symptoms.  Demonstrates L pelvic drop and R femoral adduction and IR  Air Ex pad 5x3  Single leg stands on R LE with emphasis on pelvic control, bilateral UE and L tip toe assist 10x5 seconds for 1 set today. Felt muscle work, no increase in pain.    Sit <> stand with femoral control 5x2    Improved exercise technique, movement at target joints, use of target muscles after min to mod verbal, visual,  tactile cues.    Decreased R PSIS and lateral hip symptoms in sitting to 0/10 and 1-2/10 R lateral hip pain with sit <> stand at end of session. Pt demonstrates overall decreased hip pain levels at worst, improved hip strength and function since initial evaluation. Pt still has R hip symptoms, difficulty with pelvic and femoral control with more challenging tasks such as step ups, and difficulty performing functional tasks such as stair negotiation due to pain and would benefit from continued skilled physical therapy intervention to address the aforementioned deficits.             PT Long Term Goals - 09/30/16 2119      PT LONG TERM GOAL #1   Title Pt will have a decrease in R lateral hip and SI joint pain to 4/10 or less at worst to promote ability negotiate stairs, stand up after prologed sitting, and tolerate lying on her R side when sleeping.   Baseline 8/10 R lateral hip and SI joint pain at worst (08/04/2016); 4/10 R lateral hip and SI/PSIS joint pain at worst (09/01/2016); 6/10 R PSIS and R lateral hip pain at worst for the past 7 days (09/17/2016);  4/10 R lateral hip and 3/10 R PSIS pain at most for the past 7 days (09/30/2016)   Time 4   Period Weeks   Status Partially Met     PT LONG TERM GOAL #2   Title Patient will improve bilateral glute med and max strength by at least 1/2 MMT to promote ability to negotiate stairs with less pain.    Time 4   Period Weeks   Status Partially Met     PT LONG TERM GOAL #3   Title Patient will improve her LEFS score by at least 9 points as a demonstration of improved function.    Baseline 39/80 (08/04/2016); 47/80 (09/01/2016); 56/80 (09/17/2016)   Time 4   Period Weeks   Status Achieved     PT LONG TERM GOAL #4   Title Patient will be able to ascend and descend 4 regular steps at least 3 times with bilateral rail assist with 2/10 or less R lateral hip and PSIS pain to promote ability to negotiate stairs at home.    Baseline At least 3/10 R  PSIS and 4/10 R lateral hip pain when negotiating stairs  at home (09/30/2016)   Time 4   Period Weeks   Status New               Plan - 09/30/16 0745    Clinical Impression Statement Decreased R PSIS and lateral hip symptoms in sitting to 0/10 and 1-2/10 R lateral hip pain with sit <> stand at end of session. Pt demonstrates overall decreased hip pain levels at worst, improved hip strength and function since initial evaluation. Pt still has R hip symptoms, difficulty with pelvic and femoral control with more challenging tasks such as step ups, and difficulty performing functional tasks such as stair negotiation due to pain and would benefit from continued skilled physical therapy intervention to address the aforementioned deficits.    Rehab Potential Good   Clinical Impairments Affecting Rehab Potential Chronicity of condition, bilateral knee pain   PT Frequency 2x / week   PT Duration 4 weeks   PT Treatment/Interventions Manual techniques;Therapeutic activities;Therapeutic exercise;Dry needling;Patient/family education;Neuromuscular re-education;Ultrasound;Iontophoresis 92m/ml Dexamethasone;Electrical Stimulation;Aquatic Therapy  Modalities if appropriate   PT Next Visit Plan hip strengthening, femoral control, lumbopelvic strengthening and control   Consulted and Agree with Plan of Care Patient      Patient will benefit from skilled therapeutic intervention in order to improve the following deficits and impairments:  Pain, Improper body mechanics, Decreased strength  Visit Diagnosis: Pain in right hip - Plan: PT plan of care cert/re-cert  Muscle weakness (generalized) - Plan: PT plan of care cert/re-cert     Problem List Patient Active Problem List   Diagnosis Date Noted  . Right hip pain 07/29/2016  . Osteoarthritis 04/02/2016  . Screening for cervical cancer 10/08/2015  . Pelvic pain in female 09/10/2015  . Midline thoracic back pain 08/15/2015  . Microscopic hematuria  05/08/2015  . Vertigo 10/27/2014  . [place]19[place]  . Routine physical examination 10/27/2012  . Screening for colon cancer 10/27/2012  . Hyperlipidemia 02/17/2011  . Osteopenia 02/17/2011    MJoneen BoersPT, DPT   09/30/2016, 9:42 PM  [facility] [address] [place] [address]Phone: [phone]  Fax:  [phone] Name: LLiliani BoboMRN: [id number] of Birth: 217-May-1952

## 2016-10-02 ENCOUNTER — Ambulatory Visit: Payer: Medicare Other

## 2016-10-02 DIAGNOSIS — M25551 Pain in right hip: Secondary | ICD-10-CM

## 2016-10-02 DIAGNOSIS — M6281 Muscle weakness (generalized): Secondary | ICD-10-CM

## 2016-10-02 NOTE — Therapy (Signed)
[facility] [address], [place], [address] Phone: [phone]   Fax:  [phone]  Physical Therapy Treatment  Patient Details  Name: Deborah Mcconnell MRN: [id number] Date of Birth: [birth year] Referring Provider: Burnard Hawthorne, FNP  Encounter Date: 10/02/2016      PT End of Session - 10/02/16 0733    Visit Number 14   Number of Visits 25   Date for PT Re-Evaluation 10/30/16   Authorization Type 5   Authorization Time Period of 10 g code   PT Start Time 0733   PT Stop Time 0823   PT Time Calculation (min) 50 min   Activity Tolerance Patient tolerated treatment well   Behavior During Therapy [facility] for tasks assessed/performed      Past Medical History:  Diagnosis Date  . Arthritis    osteoarthritis  . Cancer ([place])   . Hyperlipidemia   . Osteopenia    previously taking Evista and Actonel  . Osteoporosis   . Vertigo     Past Surgical History:  Procedure Laterality Date  . CHOLECYSTECTOMY    . lt breast lumpectomy  2014    There were no vitals filed for this visit.      Subjective Assessment - 10/02/16 0735    Subjective It has been better. Tuesday and Wednesday (pretty good). Wore the TENS after last session. Did not use it yesterday.  Does some R hip exercises before standing up whch helps.  Just a sensation/awareness R PSIS, not a pain.  The R lateral hip is not bothering her at all right now.    Pertinent History R hip pain. Pt states she is currently taking prednisone which relevied a lot of her R hip pain. Last dose was yesterday.  R hip pain began around November 2017. Felt like her pain was going to go away at that time but it continued. Pain is located from her R PSIS to R lateral hip and proximal lateral thigh. Feels like her pain started at her R PSIS. Difficulty lying on her R side (feels really sore).  Feels a burning sensation in her R sacroiliac area and a steady ache in her R  hip (greater trochanter area).    Patient Stated Goals Go back to walking up stairs with less pain, be able to sleep better.    Currently in Pain? No/denies   Pain Score 0-No pain   Pain Onset More than a month ago                                 PT Education - 10/02/16 0736    Education provided Yes   Education Details ther-ex   [employer]) Educated Patient   Methods Explanation;Demonstration;Tactile cues;Verbal cues   Comprehension Verbalized understanding;Returned demonstration        Objectives   There-ex   Seated bilateral shoulder extension isometrics with manual resistance from PT while pt using PVC rod 10x5 seconds for 3 sets to promote abdominal muscle use.  Standing hip machine: hip extension plate 40 for 52D7 for glute max strengthening   T-band (red) low rows for abdominal muscle use 10x3 with 5 second holds  Then with single leg stands on R LE with emphasis on pelvic control, bilateral UE and L tip toe assist 10x5 seconds for 1 set  Total gym single leg squats height 16 for 10x3, emphasis on femoral control.  Forward step up with R LE and with L UE assist, in front of the mirror for visual cues for pelvic and femoral control:          Air Ex pad 10x2. Difficulty with femoral control when stepping back down backwards  Side step with emphasis on no R hip adduction 20 ft to the R and 20 ft to the L for 2 x each with mirror visual cues.   SLS on R LE with L foot on bosu: 1 kg ball toss to trampoline 20 throws x 3  CGA to min A for pelvic control during first set. Pt able to perform with good control with the last 2 sets    Improved exercise technique, movement at target joints, use of target muscles after min to mod verbal, visual, tactile cues.   No complain or R lateral hip or R PSIS pain throughout session. Continued working on R glute med and max strengthening not going past midline with hip adduction and to promote increase load  bearing tolerance to R lateral hip structures. Continued working on step up movements in preparation for stair negotiation. Pt still demonstrates difficulty with femoral control with step ups and step downs needing cues both verbal and visual to correct and consistently maintain.         PT Long Term Goals - 09/30/16 2119      PT LONG TERM GOAL #1   Title Pt will have a decrease in R lateral hip and SI joint pain to 4/10 or less at worst to promote ability negotiate stairs, stand up after prologed sitting, and tolerate lying on her R side when sleeping.   Baseline 8/10 R lateral hip and SI joint pain at worst (08/04/2016); 4/10 R lateral hip and SI/PSIS joint pain at worst (09/01/2016); 6/10 R PSIS and R lateral hip pain at worst for the past 7 days (09/17/2016);  4/10 R lateral hip and 3/10 R PSIS pain at most for the past 7 days (09/30/2016)   Time 4   Period Weeks   Status Partially Met     PT LONG TERM GOAL #2   Title Patient will improve bilateral glute med and max strength by at least 1/2 MMT to promote ability to negotiate stairs with less pain.    Time 4   Period Weeks   Status Partially Met     PT LONG TERM GOAL #3   Title Patient will improve her LEFS score by at least 9 points as a demonstration of improved function.    Baseline 39/80 (08/04/2016); 47/80 (09/01/2016); 56/80 (09/17/2016)   Time 4   Period Weeks   Status Achieved     PT LONG TERM GOAL #4   Title Patient will be able to ascend and descend 4 regular steps at least 3 times with bilateral rail assist with 2/10 or less R lateral hip and PSIS pain to promote ability to negotiate stairs at home.    Baseline At least 3/10 R PSIS and 4/10 R lateral hip pain when negotiating stairs at home (09/30/2016)   Time 4   Period Weeks   Status New               Plan - 10/02/16 0735    Clinical Impression Statement No complain or R lateral hip or R PSIS pain throughout session. Continued working on R glute med and max  strengthening not going past midline with hip adduction and to promote increase load bearing tolerance to  R lateral hip structures. Continued working on step up movements in preparation for stair negotiation. Pt still demonstrates difficulty with femoral control with step ups and step downs needing cues both verbal and visual to correct and consistently maintain.   Rehab Potential Good   Clinical Impairments Affecting Rehab Potential Chronicity of condition, bilateral knee pain   PT Frequency 2x / week   PT Duration 4 weeks   PT Treatment/Interventions Manual techniques;Therapeutic activities;Therapeutic exercise;Dry needling;Patient/family education;Neuromuscular re-education;Ultrasound;Iontophoresis 60m/ml Dexamethasone;Electrical Stimulation;Aquatic Therapy  Modalities if appropriate   PT Next Visit Plan hip strengthening, femoral control, lumbopelvic strengthening and control   Consulted and Agree with Plan of Care Patient      Patient will benefit from skilled therapeutic intervention in order to improve the following deficits and impairments:  Pain, Improper body mechanics, Decreased strength  Visit Diagnosis: Pain in right hip  Muscle weakness (generalized)     Problem List Patient Active Problem List   Diagnosis Date Noted  . Right hip pain 07/29/2016  . Osteoarthritis 04/02/2016  . Screening for cervical cancer 10/08/2015  . Pelvic pain in female 09/10/2015  . Midline thoracic back pain 08/15/2015  . Microscopic hematuria 05/08/2015  . Vertigo 10/27/2014  . [place] 11/19/2012  . Routine physical examination 10/27/2012  . Screening for colon cancer 10/27/2012  . Hyperlipidemia 02/17/2011  . Osteopenia 02/17/2011    MJoneen BoersPT, DPT   10/02/2016, 8:31 AM  [facility] [address] [place] [address]Phone: [phone]  Fax:  [phone] Name: Deborah AnfinsonMRN: [id number] of Birth: [birth year]

## 2016-10-06 ENCOUNTER — Ambulatory Visit: Payer: Medicare Other

## 2016-10-06 DIAGNOSIS — M6281 Muscle weakness (generalized): Secondary | ICD-10-CM

## 2016-10-06 DIAGNOSIS — M25551 Pain in right hip: Secondary | ICD-10-CM | POA: Diagnosis not present

## 2016-10-06 NOTE — Therapy (Signed)
[facility] [address], [place], [address] Phone: [phone]   Fax:  [phone]  Physical Therapy Treatment  Patient Details  Name: Deborah Mcconnell MRN: [id number] Date of Birth: [birth year] Referring Provider: Burnard Hawthorne, FNP  Encounter Date: 10/06/2016      PT End of Session - 10/06/16 1353    Visit Number 15   Number of Visits 25   Date for PT Re-Evaluation 10/30/16   Authorization Type 6   Authorization Time Period of 10 g code   PT Start Time 2111   PT Stop Time 1441   PT Time Calculation (min) 47 min   Activity Tolerance Patient tolerated treatment well   Behavior During Therapy [facility] for tasks assessed/performed      Past Medical History:  Diagnosis Date  . Arthritis    osteoarthritis  . Cancer ([place])   . Hyperlipidemia   . Osteopenia    previously taking Evista and Actonel  . Osteoporosis   . Vertigo     Past Surgical History:  Procedure Laterality Date  . CHOLECYSTECTOMY    . lt breast lumpectomy  2014    There were no vitals filed for this visit.      Subjective Assessment - 10/06/16 1355    Subjective Busy at work. 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 Got out of the car earlier which was uncomfortable for her R hip. Felt frozen up. Moving helps. R hip was sore earlier.  Did some seated exercises which helped with her soreness.  Feels a burning sensation R PSIS 1/10 currently (pt sitting). 3/10 R lateral hip when standing up from sitting after a few minutes. Not sore when sitting.    Pertinent History R hip pain. Pt states she is currently taking prednisone which relevied a lot of her R hip pain. Last dose was yesterday.  R hip pain began around November 2017. Felt like her pain was going to go away at that time but it continued. Pain is located from her R PSIS to R lateral hip and proximal lateral thigh. Feels like her pain started at her R PSIS. Difficulty  lying on her R side (feels really sore).  Feels a burning sensation in her R sacroiliac area and a steady ache in her R hip (greater trochanter area).    Patient Stated Goals Go back to walking up stairs with less pain, be able to sleep better.    Currently in Pain? Yes   Pain Score 3   when standing up from sitting a few minutes    Pain Onset More than a month ago                                 PT Education - 10/06/16 1405    Education provided Yes   Education Details ther-ex   [employer]) Educated Patient   Methods Explanation;Demonstration;Tactile cues;Verbal cues   Comprehension Verbalized understanding;Returned demonstration        Objectives   There-ex  Seated T-band (red) low row10x2 with 5 second holds  Seated pallof press straight resisting red band 10x5 second holds for 2 sets  T-band (red) low rows for abdominal muscle use with single leg stands on R LE with emphasis on pelvic control, and L tip toe assist 10x5 seconds for 2 sets. Difficulty with posterior pelvic tilt and pelvic control  R LE SLS with L tip toe assist 10x5 second holds   Standing hip machine: hip extension plate 40 for 90W4 for glute max strengthening    SLS on R LE with L foot on bosu: 1 kg ball toss to trampoline 20 throws x 3             CGA to min A for pelvic control during first set.  Pt was recommended to continue using her TENs especially after PT sessions to help decrease R hip soreness.   Total gym single leg squats height 16 for 10x3, emphasis on femoral control.  Increase height next time if appropriate secondary to exercise getting easier.   Side step with emphasis on no R hip adduction 20 ft to the R and 20 ft to the L for 2 x each. Felt good for pt.      Improved exercise technique, movement at target joints, use of target muscles after min to mod verbal, visual, tactile cues.       Difficulty with low back and pelvic control during SLS on R  LE with L tip toe assist and bilateral low rows. Continued with isometric use of R glute med for pelvic control and increasing load tolerance. Difficulty with pelvic, lumbar, and femoral control with more advanced activities. Pt states R hip feeling better after session.            PT Long Term Goals - 09/30/16 2119      PT LONG TERM GOAL #1   Title Pt will have a decrease in R lateral hip and SI joint pain to 4/10 or less at worst to promote ability negotiate stairs, stand up after prologed sitting, and tolerate lying on her R side when sleeping.   Baseline 8/10 R lateral hip and SI joint pain at worst (08/04/2016); 4/10 R lateral hip and SI/PSIS joint pain at worst (09/01/2016); 6/10 R PSIS and R lateral hip pain at worst for the past 7 days (09/17/2016);  4/10 R lateral hip and 3/10 R PSIS pain at most for the past 7 days (09/30/2016)   Time 4   Period Weeks   Status Partially Met     PT LONG TERM GOAL #2   Title Patient will improve bilateral glute med and max strength by at least 1/2 MMT to promote ability to negotiate stairs with less pain.    Time 4   Period Weeks   Status Partially Met     PT LONG TERM GOAL #3   Title Patient will improve her LEFS score by at least 9 points as a demonstration of improved function.    Baseline 39/80 (08/04/2016); 47/80 (09/01/2016); 56/80 (09/17/2016)   Time 4   Period Weeks   Status Achieved     PT LONG TERM GOAL #4   Title Patient will be able to ascend and descend 4 regular steps at least 3 times with bilateral rail assist with 2/10 or less R lateral hip and PSIS pain to promote ability to negotiate stairs at home.    Baseline At least 3/10 R PSIS and 4/10 R lateral hip pain when negotiating stairs at home (09/30/2016)   Time 4   Period Weeks   Status New               Plan - 10/06/16 1407    Clinical Impression Statement Difficulty with low back and pelvic control during SLS on R LE with L tip toe assist and bilateral low rows.  Continued with isometric use of R glute med for pelvic control and increasing load tolerance. Difficulty with pelvic, lumbar, and femoral control with more advanced activities. Pt states R hip feeling better after session.    Rehab Potential Good   Clinical Impairments Affecting Rehab Potential Chronicity of condition, bilateral knee pain   PT Frequency 2x / week   PT Duration 4 weeks   PT Treatment/Interventions Manual techniques;Therapeutic activities;Therapeutic exercise;Dry needling;Patient/family education;Neuromuscular re-education;Ultrasound;Iontophoresis 90m/ml Dexamethasone;Electrical Stimulation;Aquatic Therapy  Modalities if appropriate   PT Next Visit Plan hip strengthening, femoral control, lumbopelvic strengthening and control   Consulted and Agree with Plan of Care Patient      Patient will benefit from skilled therapeutic intervention in order to improve the following deficits and impairments:  Pain, Improper body mechanics, Decreased strength  Visit Diagnosis: Pain in right hip  Muscle weakness (generalized)     Problem List Patient Active Problem List   Diagnosis Date Noted  . Right hip pain 07/29/2016  . Osteoarthritis 04/02/2016  . Screening for cervical cancer 10/08/2015  . Pelvic pain in female 09/10/2015  . Midline thoracic back pain 08/15/2015  . Microscopic hematuria 05/08/2015  . Vertigo 10/27/2014  . [place]  . Routine physical examination 10/27/2012  . Screening for colon cancer 10/27/2012  . Hyperlipidemia 02/17/2011  . Osteopenia 02/17/2011    MJoneen BoersPT, DPT   10/06/2016, 2:52 PM  [place] [facility] [facility] [address] [place] [address]Phone: [phone]  Fax:  [phone] Name: LLudie HudonMRN: [id number] of Birth: [birth year]

## 2016-10-08 ENCOUNTER — Ambulatory Visit: Payer: Medicare Other

## 2016-10-08 DIAGNOSIS — M6281 Muscle weakness (generalized): Secondary | ICD-10-CM

## 2016-10-08 DIAGNOSIS — M25551 Pain in right hip: Secondary | ICD-10-CM | POA: Diagnosis not present

## 2016-10-08 NOTE — Patient Instructions (Addendum)
Pt was recommended to gradually increase the hold times or the number of repetitions with her home exercises as tolerated to promote better carry over of decreased symptoms from one session to the next. Pt verbalized understanding.

## 2016-10-08 NOTE — Therapy (Signed)
[facility] [address] [place], [address] Phone: [phone]   Fax:  [phone]  Physical Therapy Treatment  Patient Details  Name: Deborah Mcconnell MRN: [id number] Date of Birth: Jul 11, 1950 Referring Provider: Burnard Hawthorne, FNP  Encounter Date: 10/08/2016      PT End of Session - 10/08/16 1121    Visit Number 16   Number of Visits 25   Date for PT Re-Evaluation 10/30/16   Authorization Type 7   Authorization Time Period of 10 g code   PT Start Time 1122   PT Stop Time 1206   PT Time Calculation (min) 44 min   Activity Tolerance Patient tolerated treatment well   Behavior During Therapy [facility] for tasks assessed/performed      Past Medical History:  Diagnosis Date  . Arthritis    osteoarthritis  . Cancer ([place])   . Hyperlipidemia   . Osteopenia    previously taking Evista and Actonel  . Osteoporosis   . Vertigo     Past Surgical History:  Procedure Laterality Date  . CHOLECYSTECTOMY    . lt breast lumpectomy  2014    There were no vitals filed for this visit.      Subjective Assessment - 10/08/16 1123    Subjective The PSIS and the R lateral hip is a little sore today since Monday. Has been trying to do her HEP which helps.  1/10 soreness in sitting, 3-4/10 soreness with stairs, getting up after sitting.  Usually feels better for her hip after she leaves PT.  Feels like there is no carry over. The good feeling after last session lasted until the evening.  3/10 yesterday with the getting up, rolling over, and stairs.    Pertinent History R hip pain. Pt states she is currently taking prednisone which relevied a lot of her R hip pain. Last dose was yesterday.  R hip pain began around November 2017. Felt like her pain was going to go away at that time but it continued. Pain is located from her R PSIS to R lateral hip and proximal lateral thigh. Feels like her pain started at her R PSIS.  Difficulty lying on her R side (feels really sore).  Feels a burning sensation in her R sacroiliac area and a steady ache in her R hip (greater trochanter area).    Patient Stated Goals Go back to walking up stairs with less pain, be able to sleep better.    Currently in Pain? Yes   Pain Score 1   soreness, sitting.    Pain Onset More than a month ago                                 PT Education - 10/08/16 1133    Education provided Yes   Education Details ther-ex, HEP   Person(s) Educated Patient   Methods Explanation;Demonstration;Tactile cues;Verbal cues   Comprehension Returned demonstration;Verbalized understanding        Objectives   There-ex   Seated pallof press straight resisting red band 10x5 second holds for 2 sets   Seated T-band (red) low row10x3 with 5 second holds  Seated R hip ER resisting 2 lbs 10x2  Decreased R lateral hip soreness with standing up from sitting    Standing hip machine: R hip extension plate 40 for 14H7 for glute max strengthening   R LE SLS  with L tip toe assist 10x5 second holds  Seated bilateral shoulder extension isometrics with manual resistance from PT while pt using PVC rod 10x5 seconds for 3 sets to promote abdominal muscle use.   Seated R hip extension isometrics 10x5 seconds for 3 sets with R foot on 3 inch step  Total gym single leg squats height 16 for 10x3, emphasis on femoral control. Good glute max muscle use felt   Pt was recommended to gradually increase the hold times or the number of repetitions with her home exercises as tolerated to promote better carry over of decreased symptoms from one session to the next. Pt verbalized understanding.    Improved exercise technique, movement at target joints, use of target muscles after min to mod verbal, visual, tactile cues.      Minimal R PSIS and lateral hip soreness after session per pt reports. Symptoms tend to improve with R femoral  ER, femoral control, glute max muscle and abdominal muscle use. Continued working on femoral and pelvic control when on R LE to promote ability to step up onto a step outside of therapy.               PT Long Term Goals - 09/30/16 2119      PT LONG TERM GOAL #1   Title Pt will have a decrease in R lateral hip and SI joint pain to 4/10 or less at worst to promote ability negotiate stairs, stand up after prologed sitting, and tolerate lying on her R side when sleeping.   Baseline 8/10 R lateral hip and SI joint pain at worst (08/04/2016); 4/10 R lateral hip and SI/PSIS joint pain at worst (09/01/2016); 6/10 R PSIS and R lateral hip pain at worst for the past 7 days (09/17/2016);  4/10 R lateral hip and 3/10 R PSIS pain at most for the past 7 days (09/30/2016)   Time 4   Period Weeks   Status Partially Met     PT LONG TERM GOAL #2   Title Patient will improve bilateral glute med and max strength by at least 1/2 MMT to promote ability to negotiate stairs with less pain.    Time 4   Period Weeks   Status Partially Met     PT LONG TERM GOAL #3   Title Patient will improve her LEFS score by at least 9 points as a demonstration of improved function.    Baseline 39/80 (08/04/2016); 47/80 (09/01/2016); 56/80 (09/17/2016)   Time 4   Period Weeks   Status Achieved     PT LONG TERM GOAL #4   Title Patient will be able to ascend and descend 4 regular steps at least 3 times with bilateral rail assist with 2/10 or less R lateral hip and PSIS pain to promote ability to negotiate stairs at home.    Baseline At least 3/10 R PSIS and 4/10 R lateral hip pain when negotiating stairs at home (09/30/2016)   Time 4   Period Weeks   Status New               Plan - 10/08/16 1133    Clinical Impression Statement Minimal R PSIS and lateral hip soreness after session per pt reports. Symptoms tend to improve with R femoral ER, femoral control, glute max muscle and abdominal muscle use. Continued working on  femoral and pelvic control when on R LE to promote ability to step up onto a step outside of therapy.    Rehab Potential  Good   Clinical Impairments Affecting Rehab Potential Chronicity of condition, bilateral knee pain   PT Frequency 2x / week   PT Duration 4 weeks   PT Treatment/Interventions Manual techniques;Therapeutic activities;Therapeutic exercise;Dry needling;Patient/family education;Neuromuscular re-education;Ultrasound;Iontophoresis 52m/ml Dexamethasone;Electrical Stimulation;Aquatic Therapy  Modalities if appropriate   PT Next Visit Plan hip strengthening, femoral control, lumbopelvic strengthening and control   Consulted and Agree with Plan of Care Patient      Patient will benefit from skilled therapeutic intervention in order to improve the following deficits and impairments:  Pain, Improper body mechanics, Decreased strength  Visit Diagnosis: Pain in right hip  Muscle weakness (generalized)     Problem List Patient Active Problem List   Diagnosis Date Noted  . Right hip pain 07/29/2016  . Osteoarthritis 04/02/2016  . Screening for cervical cancer 10/08/2015  . Pelvic pain in female 09/10/2015  . Midline thoracic back pain 08/15/2015  . Microscopic hematuria 05/08/2015  . Vertigo 10/27/2014  . [place]19[place]  . Routine physical examination 10/27/2012  . Screening for colon cancer 10/27/2012  . Hyperlipidemia 02/17/2011  . Osteopenia 02/17/2011    MJoneen BoersPT, DPT   10/08/2016, 12:15 PM  [facility] [address] [place] [address]Phone: [phone]  Fax:  [phone] Name: Deborah FolgerMRN: [id number] of Birth: [birth year]

## 2016-10-14 ENCOUNTER — Ambulatory Visit: Payer: Medicare Other

## 2016-10-14 DIAGNOSIS — M25551 Pain in right hip: Secondary | ICD-10-CM

## 2016-10-14 DIAGNOSIS — M6281 Muscle weakness (generalized): Secondary | ICD-10-CM

## 2016-10-14 NOTE — Patient Instructions (Signed)
  Bridge   Lie on back, legs bent. Tighten your abdominal muscles Press your hands onto the bed to make a slight indentation Squeeze your rear end muscles  Keep your thighs at neutral at all times.    Lift hips up. Repeat __10__ times. Do __3__ sessions per day.  Copyright  VHI. All rights reserved.

## 2016-10-14 NOTE — Therapy (Signed)
[facility] [address] [place], [address] Phone: [phone]   Fax:  [phone]  Physical Therapy Treatment  Patient Details  Name: Deborah Mcconnell MRN: [id number] Date of Birth: 1950-09-01 Referring Provider: Burnard Hawthorne, FNP  Encounter Date: 10/14/2016      PT End of Session - 10/14/16 1633    Visit Number 17   Number of Visits 25   Date for PT Re-Evaluation 10/30/16   Authorization Type 8   Authorization Time Period of 10 g code   PT Start Time 1632   PT Stop Time 1719   PT Time Calculation (min) 47 min   Activity Tolerance Patient tolerated treatment well   Behavior During Therapy [facility] for tasks assessed/performed      Past Medical History:  Diagnosis Date  . Arthritis    osteoarthritis  . Cancer ([place])   . Hyperlipidemia   . Osteopenia    previously taking Evista and Actonel  . Osteoporosis   . Vertigo     Past Surgical History:  Procedure Laterality Date  . CHOLECYSTECTOMY    . lt breast lumpectomy  2014    There were no vitals filed for this visit.      Subjective Assessment - 10/14/16 1635    Subjective R side is about the same, 2-3/10 R PSIS currently. 0/10 R lateral hip. (Pt currently sitting). R hip held up pretty well during her busy weekend.  Pt is sleeping better.    Pertinent History R hip pain. Pt states she is currently taking prednisone which relevied a lot of her R hip pain. Last dose was yesterday.  R hip pain began around November 2017. Felt like her pain was going to go away at that time but it continued. Pain is located from her R PSIS to R lateral hip and proximal lateral thigh. Feels like her pain started at her R PSIS. Difficulty lying on her R side (feels really sore).  Feels a burning sensation in her R sacroiliac area and a steady ache in her R hip (greater trochanter area).    Patient Stated Goals Go back to walking up stairs with less pain, be able  to sleep better.    Currently in Pain? Yes   Pain Score 3   R PSIS in sitting   Pain Onset More than a month ago                                 PT Education - 10/14/16 1654    Education provided Yes   Education Details ther-ex, HEP   Person(s) Educated Patient   Methods Explanation;Demonstration;Tactile cues;Verbal cues;Handout   Comprehension Returned demonstration;Verbalized understanding        Objectives  TTP R lateral hip at greater trochanter    There-ex  Supine muscle energy with PT to decrease anterior nutation R innominate  Supine bridge 10x3 with abdominal contraction, bilateral shoulder extension isometrics, glute max squeeze, thighs neutral  Reviewed and given as part of her HEP. Pt demonstrated and verbalized understanding.   Seated R hip ER resisting 2 lbs 10x3          Standing T-band (red) low row10x3 with 5 second holds  Forward step up onto Air Ex pad with R LE with L UE assist 5x2. Mod to max cues for femoral control  Seated hip adductor ball squeeze with glute max  squeeze 10x5 seconds   Decreased R thigh discomfort with forward step ups onto Air Ex pad  Seated bilateral shoulder extension isometrics with manual resistance from PT while pt using PVC rod 10x5 seconds for 1 sets to promote abdominal muscle use.   Pt was recommended to perform 4 sessions of 1 min x 5 R hip isometrics in sitting at 40 % effort HEP instead of just 3. Pt verbalized understanding.    Improved exercise technique, movement at target joints, use of target muscles after min to mod verbal, visual, tactile cues.    TTP R greater trochanter. Overall, pt notices improvements in her hip and PSIS area with pt being able to tolerate a busy weekend and accidentally rolling over onto her R side while sleeping is not as bad. Continued working on glute and core strengthening and femoral control. Still has difficulty with preventing R femoral adduction  and IR and L pelvic drop with step ups, resulting in discomfort.            PT Long Term Goals - 09/30/16 2119      PT LONG TERM GOAL #1   Title Pt will have a decrease in R lateral hip and SI joint pain to 4/10 or less at worst to promote ability negotiate stairs, stand up after prologed sitting, and tolerate lying on her R side when sleeping.   Baseline 8/10 R lateral hip and SI joint pain at worst (08/04/2016); 4/10 R lateral hip and SI/PSIS joint pain at worst (09/01/2016); 6/10 R PSIS and R lateral hip pain at worst for the past 7 days (09/17/2016);  4/10 R lateral hip and 3/10 R PSIS pain at most for the past 7 days (09/30/2016)   Time 4   Period Weeks   Status Partially Met     PT LONG TERM GOAL #2   Title Patient will improve bilateral glute med and max strength by at least 1/2 MMT to promote ability to negotiate stairs with less pain.    Time 4   Period Weeks   Status Partially Met     PT LONG TERM GOAL #3   Title Patient will improve her LEFS score by at least 9 points as a demonstration of improved function.    Baseline 39/80 (08/04/2016); 47/80 (09/01/2016); 56/80 (09/17/2016)   Time 4   Period Weeks   Status Achieved     PT LONG TERM GOAL #4   Title Patient will be able to ascend and descend 4 regular steps at least 3 times with bilateral rail assist with 2/10 or less R lateral hip and PSIS pain to promote ability to negotiate stairs at home.    Baseline At least 3/10 R PSIS and 4/10 R lateral hip pain when negotiating stairs at home (09/30/2016)   Time 4   Period Weeks   Status New               Plan - 10/14/16 1634    Clinical Impression Statement TTP R greater trochanter. Overall, pt notices improvements in her hip and PSIS area with pt being able to tolerate a busy weekend and accidentally rolling over onto her R side while sleeping is not as bad. Continued working on glute and core strengthening and femoral control. Still has difficulty with preventing R femoral  adduction and IR and L pelvic drop with step ups, resulting in discomfort.    Rehab Potential Good   Clinical Impairments Affecting Rehab Potential Chronicity of condition, bilateral knee  pain   PT Frequency 2x / week   PT Duration 4 weeks   PT Treatment/Interventions Manual techniques;Therapeutic activities;Therapeutic exercise;Dry needling;Patient/family education;Neuromuscular re-education;Ultrasound;Iontophoresis 26m/ml Dexamethasone;Electrical Stimulation;Aquatic Therapy  Modalities if appropriate   PT Next Visit Plan hip strengthening, femoral control, lumbopelvic strengthening and control   Consulted and Agree with Plan of Care Patient      Patient will benefit from skilled therapeutic intervention in order to improve the following deficits and impairments:  Pain, Improper body mechanics, Decreased strength  Visit Diagnosis: Pain in right hip  Muscle weakness (generalized)     Problem List Patient Active Problem List   Diagnosis Date Noted  . Right hip pain 07/29/2016  . Osteoarthritis 04/02/2016  . Screening for cervical cancer 10/08/2015  . Pelvic pain in female 09/10/2015  . Midline thoracic back pain 08/15/2015  . Microscopic hematuria 05/08/2015  . Vertigo 10/27/2014  . [place]19[place]  . Routine physical examination 10/27/2012  . Screening for colon cancer 10/27/2012  . Hyperlipidemia 02/17/2011  . Osteopenia 02/17/2011    MJoneen BoersPT, DPT  10/14/2016, 6:40 PM  [facility] [address] [place] [address]Phone: [phone]  Fax:  [phone] Name: LJacia SickmanMRN: [id number] of Birth: [birth year]

## 2016-10-16 ENCOUNTER — Ambulatory Visit: Payer: Medicare Other

## 2016-10-16 DIAGNOSIS — M6281 Muscle weakness (generalized): Secondary | ICD-10-CM

## 2016-10-16 DIAGNOSIS — M25551 Pain in right hip: Secondary | ICD-10-CM | POA: Diagnosis not present

## 2016-10-16 NOTE — Patient Instructions (Signed)
Pt was recommended to hold off on the seated R hip ER exercise. Pt verbalized understanding.

## 2016-10-16 NOTE — Therapy (Signed)
[facility] [address] [place], [address] Phone: [phone]   Fax:  [phone]  Physical Therapy Treatment  Patient Details  Name: Deborah Mcconnell MRN: [id number] Date of Birth: 10/14/1950 Referring Provider: Burnard Hawthorne, FNP  Encounter Date: 10/16/2016      PT End of Session - 10/16/16 0817    Visit Number 18   Number of Visits 25   Date for PT Re-Evaluation 10/30/16   Authorization Type 9   Authorization Time Period of 10 g code   PT Start Time 0817   PT Stop Time 0859   PT Time Calculation (min) 42 min   Activity Tolerance Patient tolerated treatment well   Behavior During Therapy [facility] for tasks assessed/performed      Past Medical History:  Diagnosis Date  . Arthritis    osteoarthritis  . Cancer ([place])   . Hyperlipidemia   . Osteopenia    previously taking Evista and Actonel  . Osteoporosis   . Vertigo     Past Surgical History:  Procedure Laterality Date  . CHOLECYSTECTOMY    . lt breast lumpectomy  2014    There were no vitals filed for this visit.      Subjective Assessment - 10/16/16 0818    Subjective R side is good. Picking her L leg up gives her a pulling sensation to R PSIS. No pain in neutral sitting. Putting her R shoe on (seated R hip ER) causes discomfort.    Pertinent History R hip pain. Pt states she is currently taking prednisone which relevied a lot of her R hip pain. Last dose was yesterday.  R hip pain began around November 2017. Felt like her pain was going to go away at that time but it continued. Pain is located from her R PSIS to R lateral hip and proximal lateral thigh. Feels like her pain started at her R PSIS. Difficulty lying on her R side (feels really sore).  Feels a burning sensation in her R sacroiliac area and a steady ache in her R hip (greater trochanter area).    Patient Stated Goals Go back to walking up stairs with less pain, be able to  sleep better.    Currently in Pain? No/denies   Pain Score 0-No pain   Pain Onset More than a month ago                                 PT Education - 10/16/16 0845    Education provided Yes   Education Details ther-ex   [employer]) Educated Patient   Methods Explanation;Demonstration;Tactile cues;Verbal cues   Comprehension Returned demonstration;Verbalized understanding        Objectives   There-ex   Seated hip adductor ball squeeze 10x10 seconds  Improved ease of putting R shoe on.   Pt was recommended to hold off on the seated R hip ER exercise. Pt verbalized understanding.    Seated bilateral shoulder extension isometrics with manual resistance from PT while pt using PVC rod 10x5 seconds for 3 sets to promote abdominal muscle use.  SLS on R LE with ball toss to trampoline 1 kg with L foot on bosu 20 throws  Then with 2 kg ball 20 throws x 2   Standing hip machine: R hip extension plate 40 for 86V6 for glute max strengthening    Side step with emphasis on  no R hip adduction 20 ft to the R and 20 ft to the L for 2 x each. Good glute med muscle use felt. No symptoms.    Forward step up onto Air Ex Pad with bilateral UE assist from TRX strap 5x3   Standing T-band (red) low row10x3with 5 second holds  Total gym single leg squats height 17 for 10x2, emphasis on femoral control.             Improved exercise technique, movement at target joints, use of target muscles after min to mod verbal, visual, tactile cues.    Good glute med muscle use felt with exercises. No complain of pain throughout session. Decreased R posterior hip/back discomfort with donning her R shoe following seated hip adductor ball squeeze with glute max squeeze exercise.                PT Long Term Goals - 09/30/16 2119      PT LONG TERM GOAL #1   Title Pt will have a decrease in R lateral hip and SI joint pain to 4/10 or less at worst to promote  ability negotiate stairs, stand up after prologed sitting, and tolerate lying on her R side when sleeping.   Baseline 8/10 R lateral hip and SI joint pain at worst (08/04/2016); 4/10 R lateral hip and SI/PSIS joint pain at worst (09/01/2016); 6/10 R PSIS and R lateral hip pain at worst for the past 7 days (09/17/2016);  4/10 R lateral hip and 3/10 R PSIS pain at most for the past 7 days (09/30/2016)   Time 4   Period Weeks   Status Partially Met     PT LONG TERM GOAL #2   Title Patient will improve bilateral glute med and max strength by at least 1/2 MMT to promote ability to negotiate stairs with less pain.    Time 4   Period Weeks   Status Partially Met     PT LONG TERM GOAL #3   Title Patient will improve her LEFS score by at least 9 points as a demonstration of improved function.    Baseline 39/80 (08/04/2016); 47/80 (09/01/2016); 56/80 (09/17/2016)   Time 4   Period Weeks   Status Achieved     PT LONG TERM GOAL #4   Title Patient will be able to ascend and descend 4 regular steps at least 3 times with bilateral rail assist with 2/10 or less R lateral hip and PSIS pain to promote ability to negotiate stairs at home.    Baseline At least 3/10 R PSIS and 4/10 R lateral hip pain when negotiating stairs at home (09/30/2016)   Time 4   Period Weeks   Status New               Plan - 10/16/16 6553    Clinical Impression Statement Good glute med muscle use felt with exercises. No complain of pain throughout session. Decreased R posterior hip/back discomfort with donning her R shoe following seated hip adductor ball squeeze with glute max squeeze exercise.    Rehab Potential Good   Clinical Impairments Affecting Rehab Potential Chronicity of condition, bilateral knee pain   PT Frequency 2x / week   PT Duration 4 weeks   PT Treatment/Interventions Manual techniques;Therapeutic activities;Therapeutic exercise;Dry needling;Patient/family education;Neuromuscular  re-education;Ultrasound;Iontophoresis 66m/ml Dexamethasone;Electrical Stimulation;Aquatic Therapy  Modalities if appropriate   PT Next Visit Plan hip strengthening, femoral control, lumbopelvic strengthening and control   Consulted and Agree with Plan of Care  Patient      Patient will benefit from skilled therapeutic intervention in order to improve the following deficits and impairments:  Pain, Improper body mechanics, Decreased strength  Visit Diagnosis: Pain in right hip  Muscle weakness (generalized)     Problem List Patient Active Problem List   Diagnosis Date Noted  . Right hip pain 07/29/2016  . Osteoarthritis 04/02/2016  . Screening for cervical cancer 10/08/2015  . Pelvic pain in female 09/10/2015  . Midline thoracic back pain 08/15/2015  . Microscopic hematuria 05/08/2015  . Vertigo 10/27/2014  . Malignant neoplasm of breast (female) ([place]) 11/19/2012  . Routine physical examination 10/27/2012  . Screening for colon cancer 10/27/2012  . Hyperlipidemia 02/17/2011  . Osteopenia 02/17/2011    Joneen Boers PT, DPT   10/16/2016, 6:01 PM  [facility] [address] [place], [address] Phone: [phone]   Fax:  [phone]  Name: Tawanda Schall MRN: [id number] Date of Birth: 1950-10-07

## 2016-10-20 ENCOUNTER — Ambulatory Visit: Payer: Medicare Other

## 2016-10-20 NOTE — Progress Notes (Signed)
10/21/2016 9:26 AM   Deborah Mcconnell 04-21-1951 [id number]  Referring provider: Burnard Hawthorne, FNP [address] [place], [place] [address]  Chief Complaint  Patient presents with  . Follow-up    3 month   micro heme vaginal atrophy    HPI: 66 yo WF with a recurrent UTI's who presents today for a 3 months follow up after initiating dietary and behavioral changes in an effort to stave off her infections.  Background history Patient with recurrent UTI's.  Patient states that she has had 3 urinary tract infections over the last year.  Her symptoms with a urinary tract infection consist of dysuria and urgency.  She denies suprapubic pain, back pain, abdominal pain or flank pain.  She has not had any recent fevers, chills, nausea or vomiting.  She does not have a history of nephrolithiasis, GU surgery or GU trauma.   Reviewing her records,  she has had two documented UTI's with E. Coli.    She is sexually active.  She has noted a correlation with her urinary tract infections and sexual intercourse.  She is post menopausal.   She does not engage in anal sex.   She is voiding before and after sex.   She denies constipation and/or diarrhea.   She does engage in good perineal hygiene. She does not take tub baths.    She does not have incontinence.  She is not having pain with bladder filling.  She had a renal ultrasound on 03/19/2016 noted bilateral parapelvic cysts. No other significant abnormality seen in the kidneys.  I have independently reviewed the films.  Cystoscopy performed on 04/16/2016 was negative.  She is drinking 1 glass of water daily.  One cup of coffee.    At her visit 3 months ago, her risk factors for recurrent urinary tract infections were identified as sexual activity, post menopausal state and limited fluid intake.  She was instructed to increase her liquid intake to 8 cups of water daily, start cranberry tablets or drink cranberry juice, start probiotics and  vitamin C 1000 mg daily.  She was not a candidate for vaginal estrogen cream due to her history of breast cancer.   The signs and symptoms of urinary tract infection were discussed with the patient and she was asked to contact us with any of these symptoms so that we may collect a catheterized specimen for analysis and possible culture.  Today, she states that she has not had any further urinary tract infections since she was last seen by [place] 3 months ago.  She did note that she has started to experience dysuria and a foul odor to her urine.  She has not had any hematuria, suprapubic pain or flank pain.  She has not had fevers, chills, nausea or vomiting.  The patient has been experiencing urgency x 0-3, frequency x 0-3, not restricting fluids to avoid visits to the restroom, not engaging in toilet mapping, incontinence x 0-3 and nocturia x 0-3.  Her UA today demonstrates 11-30 WBCs, 3-10 RBC's, many bacteria and nitrite positive.     PMH: Past Medical History:  Diagnosis Date  . Arthritis    osteoarthritis  . Cancer ([facility])   . Hyperlipidemia   . Osteopenia    previously taking Evista and Actonel  . Osteoporosis   . Vertigo     Surgical History: Past Surgical History:  Procedure Laterality Date  . CHOLECYSTECTOMY    . lt breast lumpectomy  2014  Home Medications:  Allergies as of 10/21/2016      Reactions   Ciprofloxacin Anaphylaxis   Causes death   Ofloxacin Anaphylaxis   Causes Death   Quinolones Anaphylaxis   Actonel [risedronate]    Stomach pain   Augmentin [amoxicillin-pot Clavulanate] Nausea And Vomiting   Bactrim [sulfamethoxazole-trimethoprim] Hives   Itching, hives, and GI distress      Medication List       Accurate as of 10/21/16  9:26 AM. Always use your most recent med list.          aspirin EC 81 MG tablet Take by mouth.   CALCIUM 600 600 MG Tabs tablet Generic drug:  calcium carbonate Take by mouth. Take 1,200 mg by mouth.   calcium-vitamin D  250-125 MG-UNIT tablet Commonly known as:  OSCAL WITH D Take by mouth.   cephALEXin 500 MG capsule Commonly known as:  KEFLEX Take 1 capsule (500 mg total) by mouth 2 (two) times daily.   cholecalciferol 1000 units tablet Commonly known as:  VITAMIN D Take 1,000 Units by mouth daily.   CRANBERRY PO Take by mouth.   diclofenac sodium 1 % Gel Commonly known as:  VOLTAREN Apply 4 g topically 4 (four) times daily.   ergocalciferol 50000 units capsule Commonly known as:  VITAMIN D2 Take by mouth.   ibuprofen 200 MG tablet Commonly known as:  ADVIL,MOTRIN Take 600 mg by mouth 2 (two) times daily. prn   letrozole 2.5 MG tablet Commonly known as:  FEMARA TAKE ONE TABLET BY MOUTH ONCE DAILY   meclizine 25 MG tablet Commonly known as:  ANTIVERT Take 1 tablet (25 mg total) by mouth 3 (three) times daily as needed.   naproxen sodium 220 MG tablet Commonly known as:  ANAPROX Take by mouth.   simvastatin 20 MG tablet Commonly known as:  ZOCOR Take 1 tablet (20 mg total) by mouth every evening.   SUPER B COMPLEX/C Caps Take by mouth.   VITAMIN B COMPLEX PO Take by mouth.   vitamin C 1000 MG tablet Take 1,000 mg by mouth daily.       Allergies:  Allergies  Allergen Reactions  . Ciprofloxacin Anaphylaxis    Causes death  . Ofloxacin Anaphylaxis    Causes Death  . Quinolones Anaphylaxis  . Actonel [Risedronate]     Stomach pain  . Augmentin [Amoxicillin-Pot Clavulanate] Nausea And Vomiting  . Bactrim [Sulfamethoxazole-Trimethoprim] Hives    Itching, hives, and GI distress    Family History: Family History  Problem Relation Age of Onset  . Osteoporosis Mother   . Hypertension Mother   . Osteoarthritis Mother   . Osteoporosis Sister   . Osteoarthritis Sister   . Osteoarthritis Maternal Grandmother   . Diabetes Maternal Grandfather   . Cancer Paternal Grandfather     unknown  . Prostate cancer Neg Hx   . Bladder Cancer Neg Hx   . Kidney cancer Neg Hx      Social History:  reports that she has never smoked. She has never used smokeless tobacco. She reports that she drinks alcohol. She reports that she does not use drugs.  ROS: UROLOGY Frequent Urination?: No Hard to postpone urination?: No Burning/pain with urination?: No Get up at night to urinate?: No Leakage of urine?: No Urine stream starts and stops?: No Trouble starting stream?: No Do you have to strain to urinate?: No Blood in urine?: No Urinary tract infection?: Yes Sexually transmitted disease?: No Injury to kidneys or bladder?: No  Painful intercourse?: No Weak stream?: No Currently pregnant?: No Vaginal bleeding?: No Last menstrual period?: n  Gastrointestinal Nausea?: No Vomiting?: No Indigestion/heartburn?: No Diarrhea?: No Constipation?: No  Constitutional Fever: No Night sweats?: No Weight loss?: No Fatigue?: No  Skin Skin rash/lesions?: No Itching?: No  Eyes Blurred vision?: No Double vision?: No  Ears/Nose/Throat Sore throat?: No Sinus problems?: No  Hematologic/Lymphatic Swollen glands?: No Easy bruising?: No  Cardiovascular Leg swelling?: No Chest pain?: No  Respiratory Cough?: No Shortness of breath?: No  Endocrine Excessive thirst?: No  Musculoskeletal Back pain?: No Joint pain?: Yes  Neurological Headaches?: No Dizziness?: No  Psychologic Depression?: No Anxiety?: No  Physical Exam: BP 123/74   Pulse 79   Ht 5' 11.5" (1.816 m)   Wt 183 lb 3.2 oz (83.1 kg)   BMI 25.20 kg/m   Constitutional: Well nourished. Alert and oriented, No acute distress. HEENT: [place] AT, moist mucus membranes. Trachea midline, no masses. Cardiovascular: No clubbing, cyanosis, or edema. Respiratory: Normal respiratory effort, no increased work of breathing. GI: Abdomen is soft, non tender, non distended, no abdominal masses. Liver and spleen not palpable.  No hernias appreciated.  Stool sample for occult testing is not indicated.   GU:  No CVA tenderness.  No bladder fullness or masses.   Skin: No rashes, bruises or suspicious lesions. Lymph: No cervical or inguinal adenopathy. Neurologic: Grossly intact, no focal deficits, moving all 4 extremities. Psychiatric: Normal mood and affect.  Laboratory Data: Lab Results  Component Value Date   WBC 4.9 04/02/2016   HGB 14.8 04/02/2016   HCT 43.3 04/02/2016   MCV 94.2 04/02/2016   PLT 273.0 04/02/2016    Lab Results  Component Value Date   CREATININE 0.74 04/02/2016     Lab Results  Component Value Date   HGBA1C 5.3 04/02/2016    Lab Results  Component Value Date   TSH 1.87 04/02/2016       Component Value Date/Time   CHOL 229 (H) 04/02/2016 1039   HDL 64.90 04/02/2016 1039   CHOLHDL 4 04/02/2016 1039   VLDL 13.8 04/02/2016 1039   LDLCALC 150 (H) 04/02/2016 1039    Lab Results  Component Value Date   AST 18 04/02/2016   Lab Results  Component Value Date   ALT 12 04/02/2016     Urinalysis 11-30 WBCs, 3-10 RBCs, many bacteria and nitrite positive  See EPIC.   Assessment & Plan:    1. Recurrent UTI's  - UA is suspicious for infection and patient symptomatic - will start Keflex 500 mg, bid until cultures are available  - urine culture  - Reviewed UTI prevention strategies  - Reminded patient to contact our office with symptoms of urinary tract infection or requests catheterize specimens for analysis her culture if we are unavailable                   - Urinalysis, Complete  2. Vaginal atrophy  - not a candidate for vaginal estrogen cream due to breast cancer  3. Microscopic hematuria  - UA today demonstrates 3-10 RBC's, but it is suspicious for infection - sent for culture - if culture is negative, will repeat hematuria workup Status post negative workup including renal ultrasound (previous cross-sectional imaging with delayed in 2014 normal, low risk, nonsmoker) and negative cystoscopy    Return for pending urine culture results.  These  notes generated with voice recognition software. I apologize for typographical errors.  Zara Council, [facility]  [address] [place], [place] [address] [phone]

## 2016-10-21 ENCOUNTER — Ambulatory Visit (INDEPENDENT_AMBULATORY_CARE_PROVIDER_SITE_OTHER): Payer: Medicare Other | Admitting: Urology

## 2016-10-21 ENCOUNTER — Encounter: Payer: Self-pay | Admitting: Urology

## 2016-10-21 VITALS — BP 123/74 | HR 79 | Ht 71.5 in | Wt 183.2 lb

## 2016-10-21 DIAGNOSIS — N390 Urinary tract infection, site not specified: Secondary | ICD-10-CM

## 2016-10-21 DIAGNOSIS — N952 Postmenopausal atrophic vaginitis: Secondary | ICD-10-CM | POA: Diagnosis not present

## 2016-10-21 DIAGNOSIS — R3129 Other microscopic hematuria: Secondary | ICD-10-CM | POA: Diagnosis not present

## 2016-10-21 LAB — URINALYSIS, COMPLETE
BILIRUBIN UA: NEGATIVE
GLUCOSE, UA: NEGATIVE
Nitrite, UA: POSITIVE — AB
Specific Gravity, UA: 1.02 (ref 1.005–1.030)
UUROB: 1 mg/dL (ref 0.2–1.0)
pH, UA: 6 (ref 5.0–7.5)

## 2016-10-21 LAB — MICROSCOPIC EXAMINATION

## 2016-10-21 MED ORDER — CEPHALEXIN 500 MG PO CAPS: 500.0000 mg | ORAL_CAPSULE | Freq: Two times a day (BID) | ORAL | 0 refills | Status: DC

## 2016-10-22 ENCOUNTER — Ambulatory Visit: Payer: Medicare Other

## 2016-10-22 DIAGNOSIS — M25551 Pain in right hip: Secondary | ICD-10-CM

## 2016-10-22 DIAGNOSIS — M6281 Muscle weakness (generalized): Secondary | ICD-10-CM | POA: Diagnosis not present

## 2016-10-22 NOTE — Therapy (Signed)
[facility] [address], [place], [address] Phone: [phone]   Fax:  [phone]  Physical Therapy Treatment And Progress Report  Patient Details  Name: Deborah Mcconnell MRN: [id number] Date of Birth: 11/26/1950 Referring Provider: Burnard Hawthorne, FNP  Encounter Date: 10/22/2016      PT End of Session - 10/22/16 1638    Visit Number 19   Number of Visits 35   Date for PT Re-Evaluation 11/27/16   Authorization Type 1   Authorization Time Period of 10 g code   PT Start Time 5726   PT Stop Time 1719   PT Time Calculation (min) 40 min   Activity Tolerance Patient tolerated treatment well   Behavior During Therapy [facility] for tasks assessed/performed      Past Medical History:  Diagnosis Date  . Arthritis    osteoarthritis  . Cancer ([facility])   . Hyperlipidemia   . Osteopenia    previously taking Evista and Actonel  . Osteoporosis   . Vertigo     Past Surgical History:  Procedure Laterality Date  . CHOLECYSTECTOMY    . lt breast lumpectomy  2014    There were no vitals filed for this visit.      Subjective Assessment - 10/22/16 1639    Subjective 2/10 R PSIS and R lateral hip currently. Has been pretty good this week. Had a bad day one day, took and Aleve which helped. The stairs are not a picnic  but in general much better.  2/10 at most for the past 7 days (not including stairs), 5-6/10 R hip/PSIS pain at most including stairs fot the past 7 days. Getting into and out of the car is easier.    Pertinent History R hip pain. Pt states she is currently taking prednisone which relevied a lot of her R hip pain. Last dose was yesterday.  R hip pain began around November 2017. Felt like her pain was going to go away at that time but it continued. Pain is located from her R PSIS to R lateral hip and proximal lateral thigh. Feels like her pain started at her R PSIS. Difficulty lying on her R side (feels  really sore).  Feels a burning sensation in her R sacroiliac area and a steady ache in her R hip (greater trochanter area).    Patient Stated Goals Go back to walking up stairs with less pain, be able to sleep better.    Currently in Pain? Yes   Pain Score 2    Pain Onset More than a month ago            [facility] PT Assessment - 10/22/16 1645      Strength   Right Hip Extension 4+/5   Right Hip ABduction 4+/5  slight sensation   Left Hip Extension 4+/5   Left Hip ABduction 4+/5                             PT Education - 10/22/16 1643    Education provided Yes   Education Details ther-ex   [employer]) Educated Patient   Methods Demonstration;Explanation;Tactile cues;Verbal cues   Comprehension Returned demonstration;Verbalized understanding        Objectives  No R hip discomfort putting R shoe on. Not as much symptoms with first few steps with one UE assist    There-ex  Manually resisted S/L hip abduction, prone  glute extension 1x each way for each LE  Reviewed progress/current status with hip strength with pt  Seated hip adductor ball squeeze 10x10 seconds             Seated bilateral shoulder extension isometrics with manual resistance from PT while pt using PVC rod 10x5 seconds for 3sets to promote abdominal muscle use.  Forward step up onto 3 inch step with R LE with L UE assist, emphasis on femoral control. Minimal to no pain  Forward mini static lunge onto 3 inch step 5x3 to promote load tolerance.   No pain  SLS on R LE with ball toss to trampoline 2 kg with L foot on bosu 20 throws x 3   Side step with emphasis on no R hip adduction 32 ft to the R and 32 ft to the L for 2 x each.    Standing hip machine: R hip extension plate 55 for 71I4 for glute max strengthening  Forward step up onto Air Ex Pad with bilateral UE assist from TRX strap 5x3  Reviewed plan of care: 2x/week for 4 weeks after next week to continue  progress   Improved exercise technique, movement at target joints, use of target muscles after min to mod verbal, visual, tactile cues.      Pt demonstrates decreasing R PSIS and greater trochanter pain, improved bilateral glute max and med strength, and decreased pain with stepping up onto a 3 inch step (half the height of a regular step). Improving ability to bear load with less discomfort at R lateral hip. Pt still demonstrates R PSIS and lateral hip pain, difficulty with femoral and pelvic control (but improving), and difficulty performing functional tasks such as stair negotiation and will benefit from continued skilled physical therapy services to address the aforementioned deficits.               PT Long Term Goals - 10/22/16 1644      PT LONG TERM GOAL #1   Title Pt will have a decrease in R lateral hip and SI joint pain to 4/10 or less at worst to promote ability negotiate stairs, stand up after prologed sitting, and tolerate lying on her R side when sleeping.   Baseline 8/10 R lateral hip and SI joint pain at worst (08/04/2016); 4/10 R lateral hip and SI/PSIS joint pain at worst (09/01/2016); 6/10 R PSIS and R lateral hip pain at worst for the past 7 days (09/17/2016);  4/10 R lateral hip and 3/10 R PSIS pain at most for the past 7 days (09/30/2016)   5-6/10 with stairs, 2/10 without stairs (10/22/2016)     Time 4   Period Weeks   Status Partially Met     PT LONG TERM GOAL #2   Title Patient will improve bilateral glute med and max strength by at least 1/2 MMT to promote ability to negotiate stairs with less pain.    Time 4   Period Weeks   Status Achieved     PT LONG TERM GOAL #3   Title Patient will improve her LEFS score by at least 9 points as a demonstration of improved function.    Baseline 39/80 (08/04/2016); 47/80 (09/01/2016); 56/80 (09/17/2016)   Time 4   Period Weeks   Status Achieved     PT LONG TERM GOAL #4   Title Patient will be able to ascend and descend 4 regular  steps at least 3 times with bilateral rail assist with 2/10 or less  R lateral hip and PSIS pain to promote ability to negotiate stairs at home.    Baseline At least 3/10 R PSIS and 4/10 R lateral hip pain when negotiating stairs at home (09/30/2016); 5-6/10 when trying to negotiate stairs at home 2016/11/20.   Time 4   Period Weeks   Status On-going               Plan - 2016-11-20 1651    Clinical Impression Statement  Pt demonstrates decreasing R PSIS and greater trochanter pain, improved bilateral glute max and med strength, and decreased pain with stepping up onto a 3 inch step (half the height of a regular step). Improving ability to bear load with less discomfort at R lateral hip. Pt still demonstrates R PSIS and lateral hip pain, difficulty with femoral and pelvic control (but improving), and difficulty performing functional tasks such as stair negotiation and will benefit from continued skilled physical therapy services to address the aforementioned deficits.    Rehab Potential Good   Clinical Impairments Affecting Rehab Potential Chronicity of condition, bilateral knee pain   PT Frequency 2x / week   PT Duration Other (comment)  5 weeks   PT Treatment/Interventions Manual techniques;Therapeutic activities;Therapeutic exercise;Dry needling;Patient/family education;Neuromuscular re-education;Ultrasound;Iontophoresis 13m/ml Dexamethasone;Electrical Stimulation;Aquatic Therapy  Modalities if appropriate   PT Next Visit Plan hip strengthening, femoral control, lumbopelvic strengthening and control   Consulted and Agree with Plan of Care Patient      Patient will benefit from skilled therapeutic intervention in order to improve the following deficits and impairments:  Pain, Improper body mechanics, Decreased strength  Visit Diagnosis: Pain in right hip - Plan: PT plan of care cert/re-cert  Muscle weakness (generalized) - Plan: PT plan of care cert/re-cert       G-Codes - 005-24-2018 1914    Functional Assessment Tool Used (Outpatient Only)  clinical presentation, patient interview   Functional Limitation Mobility: Walking and moving around   Mobility: Walking and Moving Around Current Status [phone] At least 20 percent but less than 40 percent impaired, limited or restricted   Mobility: Walking and Moving Around Goal Status [phone] At least 1 percent but less than 20 percent impaired, limited or restricted      Problem List Patient Active Problem List   Diagnosis Date Noted  . Right hip pain 07/29/2016  . Osteoarthritis 04/02/2016  . Screening for cervical cancer 10/08/2015  . Pelvic pain in female 09/10/2015  . Midline thoracic back pain 08/15/2015  . Microscopic hematuria 05/08/2015  . Vertigo 10/27/2014  . [place] 11/19/2012  . Routine physical examination 10/27/2012  . Screening for colon cancer 10/27/2012  . Hyperlipidemia 02/17/2011  . Osteopenia 02/17/2011    Thank you for your referral.   MJoneen BoersPT, DPT   4May 24, 2018 7:20 PM  [facility] [address] [place] [address]Phone: [phone]  Fax:  [phone] Name: LJordanna HendrieMRN: [id number] of Birth: [birth year]

## 2016-10-23 LAB — CULTURE, URINE COMPREHENSIVE

## 2016-10-24 ENCOUNTER — Telehealth: Payer: Self-pay

## 2016-10-24 DIAGNOSIS — R300 Dysuria: Secondary | ICD-10-CM

## 2016-10-24 MED ORDER — CEPHALEXIN 250 MG PO CAPS: 250.0000 mg | ORAL_CAPSULE | ORAL | 0 refills | Status: DC | PRN

## 2016-10-24 NOTE — Telephone Encounter (Signed)
Spoke with pt in reference to keflex post intercourse. Pt voiced understanding.

## 2016-10-24 NOTE — Telephone Encounter (Signed)
-----   Message from Nori Riis, PA-C sent at 10/23/2016  8:38 PM EDT ----- Please let Mrs. Mergen know that her urine culture was positive and the Keflex is the correct antibiotic.  We will need to check an UA after she completes the antibiotics to make sure the blood clears from the urine.

## 2016-10-24 NOTE — Telephone Encounter (Signed)
You can send in Keflex 250 mg, # 90, take one after intercourse to her pharmacy.

## 2016-10-24 NOTE — Telephone Encounter (Signed)
Spoke with pt in reference to ucx results and staying on abx. Also made pt aware will need a u/a post abx therapy. Pt voiced understanding. Pt then stated that she was under the understanding that she was supposed to be getting a preventative abx post intercourse, but medication was not at the pharmacy. Please advise.

## 2016-10-27 ENCOUNTER — Ambulatory Visit: Payer: Medicare Other

## 2016-10-27 DIAGNOSIS — M25551 Pain in right hip: Secondary | ICD-10-CM

## 2016-10-27 DIAGNOSIS — M6281 Muscle weakness (generalized): Secondary | ICD-10-CM | POA: Diagnosis not present

## 2016-10-27 NOTE — Therapy (Signed)
[facility] [address], [place], [address] Phone: [phone]   Fax:  [phone]  Physical Therapy Treatment  Patient Details  Name: Deborah Mcconnell MRN: [id number] Date of Birth: March 18, 1951 Referring Provider: Burnard Hawthorne, FNP  Encounter Date: 10/27/2016      PT End of Session - 10/27/16 1634    Visit Number 20   Number of Visits 35   Date for PT Re-Evaluation 11/27/16   Authorization Type 2   Authorization Time Period of 10 g code   PT Start Time 1634   PT Stop Time 1718   PT Time Calculation (min) 44 min   Activity Tolerance Patient tolerated treatment well   Behavior During Therapy [facility] for tasks assessed/performed      Past Medical History:  Diagnosis Date  . Arthritis    osteoarthritis  . Cancer ([place])   . Hyperlipidemia   . Osteopenia    previously taking Evista and Actonel  . Osteoporosis   . Vertigo     Past Surgical History:  Procedure Laterality Date  . CHOLECYSTECTOMY    . lt breast lumpectomy  2014    There were no vitals filed for this visit.      Subjective Assessment - 10/27/16 1634    Subjective The R PSIS and R lateral hip are about a 2/10 currently. Noticed waking up rolling onto her R side, but went back to her L side. Getting up from a chair, getting out of the car is easier. Less stiff standing up.    Pertinent History R hip pain. Pt states she is currently taking prednisone which relevied a lot of her R hip pain. Last dose was yesterday.  R hip pain began around November 2017. Felt like her pain was going to go away at that time but it continued. Pain is located from her R PSIS to R lateral hip and proximal lateral thigh. Feels like her pain started at her R PSIS. Difficulty lying on her R side (feels really sore).  Feels a burning sensation in her R sacroiliac area and a steady ache in her R hip (greater trochanter area).    Patient Stated Goals Go back to  walking up stairs with less pain, be able to sleep better.    Currently in Pain? Yes   Pain Score 2    Pain Onset More than a month ago                                 PT Education - 10/27/16 1640    Education provided Yes   Education Details ther-ex   [employer]) Educated Patient   Methods Explanation;Demonstration;Tactile cues;Verbal cues   Comprehension Returned demonstration;Verbalized understanding        Objectives   There-ex  Seated bilateral shoulder extension isometrics with manual resistance from PT while pt using PVC rod 10x5 seconds for 3sets to promote abdominal muscle use.   SLS on R LE with ball toss to trampoline 2 kg with Lfoot on bosu 20 throws x 3  Side step with emphasis on no R hip adduction 32 ft to the R and 32 ft to the L for 2 x each. Cues for preventing R hip adduction past midline. Good glute med muscle use felt.   Standing hip machine: R hip extension plate 55 for 69G2 for glute max strengthening  Forward mini static lunge  onto 3 inch step 5x3 to promote load tolerance.   Forward step up onto Air Ex Pad with bilateral UE assist from TRX strap 5x3  L toe tap onto treadmill platform with emphasis on level pelvis to promote R glute med strengthening 10x2  Forward step up onto 3 inch step with R LE with L UE assist, emphasis on femoral control. 5x2. Difficulty with R femoral and pelvic control. R lateral hip discomfort which eases with rest. Slight stiffness when standing up from the seated position.   Seated hip adductor ball squeeze 10x10 seconds   StandingT-band (red) low row10xwith 5 second holds  Then 10x with 10 second holds  Seated pallof press straight resisting double red band 10x10 seconds  Pt was recommended to use her TENS unit and Ice to her R hip at home x 15 minutes to help with R hip discomfort. Pt verbalized understanding.   Improved exercise technique, movement at target joints, use of  target muscles after min to mod to max (max cues for forward step ups) verbal, visual, tactile cues.    Difficulty with femoral control with stepping up onto 3 inch step, needing max verbal, visual, tactile cues. Slight increase in R lateral hip discomfort which eases with rest. Overall, pt making progress with function and decreased hip pain.          PT Long Term Goals - 10/22/16 1644      PT LONG TERM GOAL #1   Title Pt will have a decrease in R lateral hip and SI joint pain to 4/10 or less at worst to promote ability negotiate stairs, stand up after prologed sitting, and tolerate lying on her R side when sleeping.   Baseline 8/10 R lateral hip and SI joint pain at worst (08/04/2016); 4/10 R lateral hip and SI/PSIS joint pain at worst (09/01/2016); 6/10 R PSIS and R lateral hip pain at worst for the past 7 days (09/17/2016);  4/10 R lateral hip and 3/10 R PSIS pain at most for the past 7 days (09/30/2016)   5-6/10 with stairs, 2/10 without stairs (10/22/2016)     Time 4   Period Weeks   Status Partially Met     PT LONG TERM GOAL #2   Title Patient will improve bilateral glute med and max strength by at least 1/2 MMT to promote ability to negotiate stairs with less pain.    Time 4   Period Weeks   Status Achieved     PT LONG TERM GOAL #3   Title Patient will improve her LEFS score by at least 9 points as a demonstration of improved function.    Baseline 39/80 (08/04/2016); 47/80 (09/01/2016); 56/80 (09/17/2016)   Time 4   Period Weeks   Status Achieved     PT LONG TERM GOAL #4   Title Patient will be able to ascend and descend 4 regular steps at least 3 times with bilateral rail assist with 2/10 or less R lateral hip and PSIS pain to promote ability to negotiate stairs at home.    Baseline At least 3/10 R PSIS and 4/10 R lateral hip pain when negotiating stairs at home (09/30/2016); 5-6/10 when trying to negotiate stairs at home 10/22/2016.   Time 4   Period Weeks   Status On-going                Plan - 10/27/16 1633    Clinical Impression Statement Difficulty with femoral control with stepping up onto 3 inch  step, needing max verbal, visual, tactile cues. Slight increase in R lateral hip discomfort which eases with rest. Overall, pt making progress with function and decreased hip pain.   Rehab Potential Good   Clinical Impairments Affecting Rehab Potential Chronicity of condition, bilateral knee pain   PT Frequency 2x / week   PT Duration Other (comment)  5 weeks   PT Treatment/Interventions Manual techniques;Therapeutic activities;Therapeutic exercise;Dry needling;Patient/family education;Neuromuscular re-education;Ultrasound;Iontophoresis 23m/ml Dexamethasone;Electrical Stimulation;Aquatic Therapy  Modalities if appropriate   PT Next Visit Plan hip strengthening, femoral control, lumbopelvic strengthening and control   Consulted and Agree with Plan of Care Patient      Patient will benefit from skilled therapeutic intervention in order to improve the following deficits and impairments:  Pain, Improper body mechanics, Decreased strength  Visit Diagnosis: Pain in right hip  Muscle weakness (generalized)     Problem List Patient Active Problem List   Diagnosis Date Noted  . Right hip pain 07/29/2016  . Osteoarthritis 04/02/2016  . Screening for cervical cancer 10/08/2015  . Pelvic pain in female 09/10/2015  . Midline thoracic back pain 08/15/2015  . Microscopic hematuria 05/08/2015  . Vertigo 10/27/2014  . [place] 11/19[place]2012  . Routine physical examination 10/27/2012  . Screening for colon cancer 10/27/2012  . Hyperlipidemia 02/17/2011  . Osteopenia 02/17/2011    MJoneen BoersPT, DPT   10/27/2016, 6:25 PM  [facility] [address] [place] [address]Phone: [phone]  Fax:  [phone] Name: LEvangelyne LojaMRN:  [id number] of Birth: [birth year]

## 2016-10-30 ENCOUNTER — Ambulatory Visit: Payer: Medicare Other | Attending: Family

## 2016-10-30 DIAGNOSIS — M25551 Pain in right hip: Secondary | ICD-10-CM | POA: Insufficient documentation

## 2016-10-30 DIAGNOSIS — M6281 Muscle weakness (generalized): Secondary | ICD-10-CM | POA: Diagnosis not present

## 2016-10-30 NOTE — Therapy (Signed)
[place] [facility] [address] [place], [address] Phone: [phone]   Fax:  [phone]  Physical Therapy Treatment  Patient Details  Name: Deborah Mcconnell MRN: [id number] Date of Birth: 14-Oct-1950 Referring Provider: Burnard Hawthorne, FNP  Encounter Date: 10/30/2016      PT End of Session - 10/30/16 1635    Visit Number 21   Number of Visits 35   Date for PT Re-Evaluation 11/27/16   Authorization Type 3   Authorization Time Period of 10 g code   PT Start Time 1635   PT Stop Time 1721   PT Time Calculation (min) 46 min   Activity Tolerance Patient tolerated treatment well   Behavior During Therapy [facility] for tasks assessed/performed      Past Medical History:  Diagnosis Date  . Arthritis    osteoarthritis  . Cancer ([place])   . Hyperlipidemia   . Osteopenia    previously taking Evista and Actonel  . Osteoporosis   . Vertigo     Past Surgical History:  Procedure Laterality Date  . CHOLECYSTECTOMY    . lt breast lumpectomy  2014    There were no vitals filed for this visit.      Subjective Assessment - 10/30/16 1636    Subjective The R PSIS is not bothering her too much. Feels an ache R lateral hip. Feels like it is still heading in the right direction.  2/10 R lateral hip ache and nagging thing.    Pertinent History R hip pain. Pt states she is currently taking prednisone which relevied a lot of her R hip pain. Last dose was yesterday.  R hip pain began around November 2017. Felt like her pain was going to go away at that time but it continued. Pain is located from her R PSIS to R lateral hip and proximal lateral thigh. Feels like her pain started at her R PSIS. Difficulty lying on her R side (feels really sore).  Feels a burning sensation in her R sacroiliac area and a steady ache in her R hip (greater trochanter area).    Patient Stated Goals Go back to walking up stairs with less pain, be able to  sleep better.    Currently in Pain? Yes   Pain Score 2    Pain Orientation Right   Pain Descriptors / Indicators Aching;Nagging   Pain Onset More than a month ago                                 PT Education - 10/30/16 1642    Education provided Yes   Education Details ther-ex    [employer]) Educated Patient   Methods Explanation;Demonstration;Tactile cues;Verbal cues   Comprehension Verbalized understanding;Returned demonstration        Objectives   There-ex  Sit <> stand from regular chair with red band resisting hip abduction/ER 10x2  Bilateral UE assist to stand, no UE assist to sit.   Seated hip adductor ball squeeze with glute max squeeze 10x10 seconds  Side step with emphasis on no R hip adduction 45f to the R and 333fto the L for 2 x each.   Total gym single leg squats height 18 for 10x  Then with red band resisting pallof press to the R 10x  Then with red band resisting pallow press to the L 10x   To promote trunk muscle  use.   Good femoral and pelvic control with single leg squats at total gym probably due to back being supported.    Single leg stands with opposite tip toe assist and with low rows resisting red band 10x5 seconds each LE to promote trunk, and pelvic control at the same time.   SLS on R LE with ball toss to trampoline 2kg with Lfoot on upside down bosu 20 throws x 3   Forward mini static lunge onto 3 inch step 10x2 to promote load tolerance.    Standing hip machine: R hip extension plate 55 for 38S5 for glute max strengthening   Seated pallof press (straight) resisting double red band 10x10 seconds  Seated pallof press (regular) resisting double red band 5x5 seconds R and L sides   Improved exercise technique, movement at target joints, use of target muscles after min to mod verbal, visual, tactile cues.    Improving R PSIS symptoms. R lateral hip symptoms stabilizing to 2/10. Pt demonstrates good  pelvic and femoral control with single leg squats on R LE at total gym probably due to back being supported. Demonstrates weakness with lumbopelvic and femoral control when stepping up onto a 3 inch step. Worked on trunk strengthening to help address control deficit.              PT Long Term Goals - 10/22/16 1644      PT LONG TERM GOAL #1   Title Pt will have a decrease in R lateral hip and SI joint pain to 4/10 or less at worst to promote ability negotiate stairs, stand up after prologed sitting, and tolerate lying on her R side when sleeping.   Baseline 8/10 R lateral hip and SI joint pain at worst (08/04/2016); 4/10 R lateral hip and SI/PSIS joint pain at worst (09/01/2016); 6/10 R PSIS and R lateral hip pain at worst for the past 7 days (09/17/2016);  4/10 R lateral hip and 3/10 R PSIS pain at most for the past 7 days (09/30/2016)   5-6/10 with stairs, 2/10 without stairs (10/22/2016)     Time 4   Period Weeks   Status Partially Met     PT LONG TERM GOAL #2   Title Patient will improve bilateral glute med and max strength by at least 1/2 MMT to promote ability to negotiate stairs with less pain.    Time 4   Period Weeks   Status Achieved     PT LONG TERM GOAL #3   Title Patient will improve her LEFS score by at least 9 points as a demonstration of improved function.    Baseline 39/80 (08/04/2016); 47/80 (09/01/2016); 56/80 (09/17/2016)   Time 4   Period Weeks   Status Achieved     PT LONG TERM GOAL #4   Title Patient will be able to ascend and descend 4 regular steps at least 3 times with bilateral rail assist with 2/10 or less R lateral hip and PSIS pain to promote ability to negotiate stairs at home.    Baseline At least 3/10 R PSIS and 4/10 R lateral hip pain when negotiating stairs at home (09/30/2016); 5-6/10 when trying to negotiate stairs at home 10/22/2016.   Time 4   Period Weeks   Status On-going               Plan - 10/30/16 1643    Clinical Impression Statement  Improving R PSIS symptoms. R lateral hip symptoms stabilizing to 2/10. Pt demonstrates good  pelvic and femoral control with single leg squats on R LE at total gym probably due to back being supported. Demonstrates weakness with lumbopelvic and femoral control when stepping up onto a 3 inch step. Worked on trunk strengthening to help address control deficit.      Rehab Potential Good   Clinical Impairments Affecting Rehab Potential Chronicity of condition, bilateral knee pain   PT Frequency 2x / week   PT Duration Other (comment)  5 weeks   PT Treatment/Interventions Manual techniques;Therapeutic activities;Therapeutic exercise;Dry needling;Patient/family education;Neuromuscular re-education;Ultrasound;Iontophoresis 57m/ml Dexamethasone;Electrical Stimulation;Aquatic Therapy  Modalities if appropriate   PT Next Visit Plan hip strengthening, femoral control, lumbopelvic strengthening and control   Consulted and Agree with Plan of Care Patient      Patient will benefit from skilled therapeutic intervention in order to improve the following deficits and impairments:  Pain, Improper body mechanics, Decreased strength  Visit Diagnosis: Pain in right hip  Muscle weakness (generalized)     Problem List Patient Active Problem List   Diagnosis Date Noted  . Right hip pain 07/29/2016  . Osteoarthritis 04/02/2016  . Screening for cervical cancer 10/08/2015  . Pelvic pain in female 09/10/2015  . Midline thoracic back pain 08/15/2015  . Microscopic hematuria 05/08/2015  . Vertigo 10/27/2014  . [place] 11/19/2012  . Routine physical examination 10/27/2012  . Screening for colon cancer 10/27/2012  . Hyperlipidemia 02/17/2011  . Osteopenia 02/17/2011    MJoneen BoersPT, DPT   10/30/2016, 5:31 PM  [facility] [address] [place] [address]Phone: [phone]  Fax:   [phone] Name: Deborah GudielMRN: [id number] of Birth: [birth year]

## 2016-11-06 ENCOUNTER — Other Ambulatory Visit: Payer: Medicare Other

## 2016-11-06 ENCOUNTER — Ambulatory Visit: Payer: Medicare Other

## 2016-11-06 ENCOUNTER — Telehealth: Payer: Self-pay

## 2016-11-06 DIAGNOSIS — R3129 Other microscopic hematuria: Secondary | ICD-10-CM | POA: Diagnosis not present

## 2016-11-06 DIAGNOSIS — M6281 Muscle weakness (generalized): Secondary | ICD-10-CM | POA: Diagnosis not present

## 2016-11-06 DIAGNOSIS — M25551 Pain in right hip: Secondary | ICD-10-CM | POA: Diagnosis not present

## 2016-11-06 LAB — URINALYSIS, COMPLETE
Bilirubin, UA: NEGATIVE
GLUCOSE, UA: NEGATIVE
KETONES UA: NEGATIVE
Leukocytes, UA: NEGATIVE
NITRITE UA: NEGATIVE
PROTEIN UA: NEGATIVE
SPEC GRAV UA: 1.025 (ref 1.005–1.030)
UUROB: 0.2 mg/dL (ref 0.2–1.0)
pH, UA: 5.5 (ref 5.0–7.5)

## 2016-11-06 LAB — MICROSCOPIC EXAMINATION

## 2016-11-06 NOTE — Telephone Encounter (Signed)
LMOM

## 2016-11-06 NOTE — Telephone Encounter (Signed)
-----   Message from Nori Riis, PA-C sent at 11/06/2016  1:21 PM EDT ----- Please let Deborah Mcconnell known blood is still present in her urine. It does look like the infection has cleared. I suggest we repeat a hematuria workup at this time with CT urogram and cystoscopy.

## 2016-11-06 NOTE — Therapy (Signed)
[place] [facility] [address], [place], [address] Phone: [phone]   Fax:  [phone]  Physical Therapy Treatment  Patient Details  Name: Deborah Mcconnell MRN: [id number] Date of Birth: 1950-09-14 Referring Provider: Burnard Hawthorne, FNP  Encounter Date: 11/06/2016      PT End of Session - 11/06/16 0945    Visit Number 22   Number of Visits 35   Date for PT Re-Evaluation 11/27/16   Authorization Type 4   Authorization Time Period of 10 g code   PT Start Time 0945   PT Stop Time 1021   PT Time Calculation (min) 36 min   Activity Tolerance Patient tolerated treatment well   Behavior During Therapy [facility] for tasks assessed/performed      Past Medical History:  Diagnosis Date  . Arthritis    osteoarthritis  . Cancer ([place])   . Hyperlipidemia   . Osteopenia    previously taking Evista and Actonel  . Osteoporosis   . Vertigo     Past Surgical History:  Procedure Laterality Date  . CHOLECYSTECTOMY    . lt breast lumpectomy  2014    There were no vitals filed for this visit.      Subjective Assessment - 11/06/16 0946    Subjective Pt states that she is good today but could hardly move on Friday. The sit <> stand from the chair exercise bothered her knees.  The pallof press (lateral) made her thoracic area sore but it went away. Better today.  Went away on Sunday.  The R hip and PSIS is pretty good. No pain currently. Woke her up when she rolled onto it but was able to go back to sleep.  Feels more of a weakness now at her hip in the morning instead of weakness.    Pertinent History R hip pain. Pt states she is currently taking prednisone which relevied a lot of her R hip pain. Last dose was yesterday.  R hip pain began around November 2017. Felt like her pain was going to go away at that time but it continued. Pain is located from her R PSIS to R lateral hip and proximal lateral thigh. Feels like her  pain started at her R PSIS. Difficulty lying on her R side (feels really sore).  Feels a burning sensation in her R sacroiliac area and a steady ache in her R hip (greater trochanter area).    Patient Stated Goals Go back to walking up stairs with less pain, be able to sleep better.    Currently in Pain? No/denies   Pain Score 0-No pain   Pain Onset More than a month ago                                 PT Education - 11/06/16 0953    Education provided Yes   Education Details ther-ex   [employer]) Educated Patient   Methods Explanation;Demonstration;Tactile cues;Verbal cues   Comprehension Verbalized understanding;Returned demonstration        Objectives  Hold off regular pallof press exercise (lateral). Can perform straight version.     There-ex   Side step with emphasis on no R hip adduction 86f to the R and 3104fto the L for 2 x each.   SLS on R LE with ball toss to trampoline 2kg with Lfoot on upside down bosu 20 throws x 3  Forward mini static lunge onto bosu 10x2 to promote load tolerance.   Single leg stands with opposite tip toe assist and with low rows resisting red band 10x5 seconds for 2 sets each LE to promote trunk, and pelvic control at the same time.   Total gym single leg squats height 18 with Air Ex pad onf foot pad for 10x3  Standing hip machine: R hip extension plate 21fr [id number] max strengthening  Forward step up half way onto 3 inch step with R LE with L tip toe assist and TRX strap assist 5x3 to promote lumbopelvic and femoral control to gradually step up onto a step with less pain.        Improved exercise technique, movement at target joints, use of target muscles after min to mod verbal, visual, tactile cues.    No increase in R hip or PSIS symptoms today. Continued working on increasing glute med and max strength, improve load tolerance to R hip, and gradually improve lumbopelvic and femoral  control with stepping up onto a step.           PT Long Term Goals - 10/22/16 1644      PT LONG TERM GOAL #1   Title Pt will have a decrease in R lateral hip and SI joint pain to 4/10 or less at worst to promote ability negotiate stairs, stand up after prologed sitting, and tolerate lying on her R side when sleeping.   Baseline 8/10 R lateral hip and SI joint pain at worst (08/04/2016); 4/10 R lateral hip and SI/PSIS joint pain at worst (09/01/2016); 6/10 R PSIS and R lateral hip pain at worst for the past 7 days (09/17/2016);  4/10 R lateral hip and 3/10 R PSIS pain at most for the past 7 days (09/30/2016)   5-6/10 with stairs, 2/10 without stairs (10/22/2016)     Time 4   Period Weeks   Status Partially Met     PT LONG TERM GOAL #2   Title Patient will improve bilateral glute med and max strength by at least 1/2 MMT to promote ability to negotiate stairs with less pain.    Time 4   Period Weeks   Status Achieved     PT LONG TERM GOAL #3   Title Patient will improve her LEFS score by at least 9 points as a demonstration of improved function.    Baseline 39/80 (08/04/2016); 47/80 (09/01/2016); 56/80 (09/17/2016)   Time 4   Period Weeks   Status Achieved     PT LONG TERM GOAL #4   Title Patient will be able to ascend and descend 4 regular steps at least 3 times with bilateral rail assist with 2/10 or less R lateral hip and PSIS pain to promote ability to negotiate stairs at home.    Baseline At least 3/10 R PSIS and 4/10 R lateral hip pain when negotiating stairs at home (09/30/2016); 5-6/10 when trying to negotiate stairs at home 10/22/2016.   Time 4   Period Weeks   Status On-going               Plan - 11/06/16 04540   Clinical Impression Statement No increase in R hip or PSIS symptoms today. Continued working on increasing glute med and max strength, improve load tolerance to R hip, and gradually improve lumbopelvic and femoral control with stepping up onto a step.    Rehab  Potential Good   Clinical Impairments Affecting Rehab Potential Chronicity of  condition, bilateral knee pain   PT Frequency 2x / week   PT Duration Other (comment)  5 weeks   PT Treatment/Interventions Manual techniques;Therapeutic activities;Therapeutic exercise;Dry needling;Patient/family education;Neuromuscular re-education;Ultrasound;Iontophoresis 2m/ml Dexamethasone;Electrical Stimulation;Aquatic Therapy  Modalities if appropriate   PT Next Visit Plan hip strengthening, femoral control, lumbopelvic strengthening and control   Consulted and Agree with Plan of Care Patient      Patient will benefit from skilled therapeutic intervention in order to improve the following deficits and impairments:  Pain, Improper body mechanics, Decreased strength  Visit Diagnosis: Pain in right hip  Muscle weakness (generalized)     Problem List Patient Active Problem List   Diagnosis Date Noted  . Right hip pain 07/29/2016  . Osteoarthritis 04/02/2016  . Screening for cervical cancer 10/08/2015  . Pelvic pain in female 09/10/2015  . Midline thoracic back pain 08/15/2015  . Microscopic hematuria 05/08/2015  . Vertigo 10/27/2014  . Malignant [place]) [place] 11/19/2012  . Routine physical examination 10/27/2012  . Screening for colon cancer 10/27/2012  . Hyperlipidemia 02/17/2011  . Osteopenia 02/17/2011    MJoneen BoersPT, DPT   11/06/2016, 10:29 AM  [facility] [address] [place] [address]Phone: [phone]  Fax:  [phone] Name: LKamyiah ColantonioMRN: [id number] of Birth: 205-19-1952

## 2016-11-10 NOTE — Telephone Encounter (Signed)
LMOM

## 2016-11-11 ENCOUNTER — Other Ambulatory Visit: Payer: Self-pay

## 2016-11-11 ENCOUNTER — Ambulatory Visit: Payer: Medicare Other

## 2016-11-11 DIAGNOSIS — M25551 Pain in right hip: Secondary | ICD-10-CM | POA: Diagnosis not present

## 2016-11-11 DIAGNOSIS — M6281 Muscle weakness (generalized): Secondary | ICD-10-CM | POA: Diagnosis not present

## 2016-11-11 DIAGNOSIS — R3129 Other microscopic hematuria: Secondary | ICD-10-CM

## 2016-11-11 NOTE — Patient Instructions (Signed)
Pt was recommended to use a lumbar towel roll whenever sitting to help her with her back symptoms. Pt demonstrated and verbalized understanding.

## 2016-11-11 NOTE — Therapy (Signed)
[place] [facility] [address] [place], [address] Phone: [phone]   Fax:  [phone]  Physical Therapy Treatment  Patient Details  Name: Deborah Mcconnell MRN: [id number] Date of Birth: 01-Nov-1950 Referring Provider: Burnard Hawthorne, FNP  Encounter Date: 11/11/2016      PT End of Session - 11/11/16 0818    Visit Number 23   Number of Visits 35   Date for PT Re-Evaluation 11/27/16   Authorization Type 5   Authorization Time Period of 10 g code   PT Start Time 0818   PT Stop Time 0901   PT Time Calculation (min) 43 min   Activity Tolerance Patient tolerated treatment well   Behavior During Therapy [facility] for tasks assessed/performed      Past Medical History:  Diagnosis Date  . Arthritis    osteoarthritis  . Cancer ([place])   . Hyperlipidemia   . Osteopenia    previously taking Evista and Actonel  . Osteoporosis   . Vertigo     Past Surgical History:  Procedure Laterality Date  . CHOLECYSTECTOMY    . lt breast lumpectomy  2014    There were no vitals filed for this visit.      Subjective Assessment - 11/11/16 0819    Subjective Things are still status quo. So much easier to get up and get out of the car. Rolling over in bed still bothers her. Feels a low intensity burning sensation in R PSIS since her R hip symptoms started which comes and go. Everything is so much better. Not gone. Steps still bother her. When she does have to try them, she notices that it's better than it was.    Pertinent History R hip pain. Pt states she is currently taking prednisone which relevied a lot of her R hip pain. Last dose was yesterday.  R hip pain began around November 2017. Felt like her pain was going to go away at that time but it continued. Pain is located from her R PSIS to R lateral hip and proximal lateral thigh. Feels like her pain started at her R PSIS. Difficulty lying on her R side (feels really sore).   Feels a burning sensation in her R sacroiliac area and a steady ache in her R hip (greater trochanter area).    Patient Stated Goals Go back to walking up stairs with less pain, be able to sleep better.    Currently in Pain? Yes   Pain Score --  No pain level provided.    Pain Onset More than a month ago                                 PT Education - 11/11/16 6606    Education provided Yes   Education Details ther-ex   [employer]) Educated Patient   Methods Explanation;Demonstration;Tactile cues;Verbal cues   Comprehension Verbalized understanding;Returned demonstration        Objectives  Hold off regular pallof press exercise (lateral). Can perform straight version.   Less TTP R greater trochanter.    There-ex   Seated manually resisted bilateral shoulder extension isometrics with pt holding PVC bar 10x2 with 5 second holds  Sitting with lumbar towel roll x 2 min  Decreased R PSIS burning sensation.    Single leg stands with opposite tip toe assist and with low rows resisting red band 10x5 seconds for  2 sets R LE, and 10x5 seconds for L LE to promote trunk, and pelvic control at the same time.   Lateral step up onto Air Ex pad 10x3  Standing hip machine: R hip extension plate 26fr [id number] max strengthening  Forward step up half way onto 3 inch step with R LE with L tip toe assist and TRX strap assist 5x3 with 5 second holds to promote lumbopelvic and femoral control to gradually step up onto a step with less pain.    Side step with emphasis on no R hip adduction 363fto the R and 3236fo the L for 2 x each.    Standing leg press (to simulate stepping up onto a step) resisting double blue band 10x2. Cues for R LE control   Improved exercise technique, movement at target joints, use of target muscles after min to mod verbal, visual, tactile cues.    Continued working on R LE strengthening to promote lumbo pelvic and femoral  control with stepping up onto a step. Still demonstrates difficulty with controlling aforementioned areas with stairs. No complain of R hip pain throughout session. Decreased R burning sensation to low back after sitting with lumbar towel roll.         PT Long Term Goals - 10/22/16 1644      PT LONG TERM GOAL #1   Title Pt will have a decrease in R lateral hip and SI joint pain to 4/10 or less at worst to promote ability negotiate stairs, stand up after prologed sitting, and tolerate lying on her R side when sleeping.   Baseline 8/10 R lateral hip and SI joint pain at worst (08/04/2016); 4/10 R lateral hip and SI/PSIS joint pain at worst (09/01/2016); 6/10 R PSIS and R lateral hip pain at worst for the past 7 days (09/17/2016);  4/10 R lateral hip and 3/10 R PSIS pain at most for the past 7 days (09/30/2016)   5-6/10 with stairs, 2/10 without stairs (10/22/2016)     Time 4   Period Weeks   Status Partially Met     PT LONG TERM GOAL #2   Title Patient will improve bilateral glute med and max strength by at least 1/2 MMT to promote ability to negotiate stairs with less pain.    Time 4   Period Weeks   Status Achieved     PT LONG TERM GOAL #3   Title Patient will improve her LEFS score by at least 9 points as a demonstration of improved function.    Baseline 39/80 (08/04/2016); 47/80 (09/01/2016); 56/80 (09/17/2016)   Time 4   Period Weeks   Status Achieved     PT LONG TERM GOAL #4   Title Patient will be able to ascend and descend 4 regular steps at least 3 times with bilateral rail assist with 2/10 or less R lateral hip and PSIS pain to promote ability to negotiate stairs at home.    Baseline At least 3/10 R PSIS and 4/10 R lateral hip pain when negotiating stairs at home (09/30/2016); 5-6/10 when trying to negotiate stairs at home 10/22/2016.   Time 4   Period Weeks   Status On-going               Plan - 11/11/16 0816    Clinical Impression Statement Continued working on R LE  strengthening to promote lumbo pelvic and femoral control with stepping up onto a step. Still demonstrates difficulty with controlling aforementioned areas with stairs.  No complain of R hip pain throughout session. Decreased R burning sensation to low back after sitting with lumbar towel roll.    Rehab Potential Good   Clinical Impairments Affecting Rehab Potential Chronicity of condition, bilateral knee pain   PT Frequency 2x / week   PT Duration Other (comment)  5 weeks   PT Treatment/Interventions Manual techniques;Therapeutic activities;Therapeutic exercise;Dry needling;Patient/family education;Neuromuscular re-education;Ultrasound;Iontophoresis 25m/ml Dexamethasone;Electrical Stimulation;Aquatic Therapy  Modalities if appropriate   PT Next Visit Plan hip strengthening, femoral control, lumbopelvic strengthening and control   Consulted and Agree with Plan of Care Patient      Patient will benefit from skilled therapeutic intervention in order to improve the following deficits and impairments:  Pain, Improper body mechanics, Decreased strength  Visit Diagnosis: Pain in right hip  Muscle weakness (generalized)     Problem List Patient Active Problem List   Diagnosis Date Noted  . Right hip pain 07/29/2016  . Osteoarthritis 04/02/2016  . Screening for cervical cancer 10/08/2015  . Pelvic pain in female 09/10/2015  . Midline thoracic back pain 08/15/2015  . Microscopic hematuria 05/08/2015  . Vertigo 10/27/2014  . Mal[place] 11/19[place]2012  . Routine physical examination 10/27/2012  . Screening for colon cancer 10/27/2012  . Hyperlipidemia 02/17/2011  . Osteopenia 02/17/2011    MJoneen BoersPT, DPT   11/11/2016, 9:22 AM  [facility] [place] [facility] [address] [place] [address]Phone: [phone]  Fax:  [phone] Name: LQuanasia DefinoMRN: [id number] of Birth:  [birth year]

## 2016-11-11 NOTE — Telephone Encounter (Signed)
LMOM- will send a letter.  

## 2016-11-12 ENCOUNTER — Ambulatory Visit: Payer: Medicare Other

## 2016-11-13 ENCOUNTER — Ambulatory Visit: Payer: Medicare Other

## 2016-11-13 DIAGNOSIS — M6281 Muscle weakness (generalized): Secondary | ICD-10-CM

## 2016-11-13 DIAGNOSIS — M25551 Pain in right hip: Secondary | ICD-10-CM

## 2016-11-13 NOTE — Therapy (Signed)
[place] [facility] [address] [place], [address] Phone: [phone]   Fax:  [phone]  Physical Therapy Treatment  Patient Details  Name: Deborah Mcconnell MRN: [id number] Date of Birth: [birth year] Referring Provider: Burnard Hawthorne, FNP  Encounter Date: 11/13/2016      PT End of Session - 11/13/16 1701    Visit Number 24   Number of Visits 35   Date for PT Re-Evaluation 11/27/16   Authorization Type 6   Authorization Time Period of 10 g code   PT Start Time 1701   PT Stop Time 1744   PT Time Calculation (min) 43 min   Activity Tolerance Patient tolerated treatment well   Behavior During Therapy [facility] for tasks assessed/performed      Past Medical History:  Diagnosis Date  . Arthritis    osteoarthritis  . Cancer ([place])   . Hyperlipidemia   . Osteopenia    previously taking Evista and Actonel  . Osteoporosis   . Vertigo     Past Surgical History:  Procedure Laterality Date  . CHOLECYSTECTOMY    . lt breast lumpectomy  2014    There were no vitals filed for this visit.      Subjective Assessment - 11/13/16 1703    Subjective The burning is gone. Just has a little bit of discomfort R thigh when getting up from the car but went away. Had to go up a flight of stairs but it did not hurt as much as it used to.  The last couple of days were pretty good.    Pertinent History R hip pain. Pt states she is currently taking prednisone which relevied a lot of her R hip pain. Last dose was yesterday.  R hip pain began around November 2017. Felt like her pain was going to go away at that time but it continued. Pain is located from her R PSIS to R lateral hip and proximal lateral thigh. Feels like her pain started at her R PSIS. Difficulty lying on her R side (feels really sore).  Feels a burning sensation in her R sacroiliac area and a steady ache in her R hip (greater trochanter area).    Patient Stated  Goals Go back to walking up stairs with less pain, be able to sleep better.    Currently in Pain? No/denies   Pain Score 0-No pain   Pain Onset More than a month ago                                 PT Education - 11/13/16 1706    Education provided Yes   Education Details ther-ex   [employer]) Educated Patient   Methods Explanation;Demonstration;Tactile cues;Verbal cues   Comprehension Returned demonstration;Verbalized understanding        Objectives  Hold off regular pallof press exercise (lateral). Can perform straight version.      There-ex   Side step with emphasis on no R hip adduction 14f to the R and 36fto the L for 2 x each.   SLS on R LE with L foot on upside down bosu 20 throws x 3 with 2 kg ball  Lateral step up onto Air Ex pad 10x3   Single leg stands with opposite tip toe assist and with low rows resisting red band 10x5 seconds for 2 sets R LE, and 10x5 seconds for L LE  to promote trunk, and pelvic control at the same time.   Standing hip machine: R hip extension plate 22fr [id number] max strengthening  Forward step up half way onto 3 inch step with R LE with L tip toe assist and TRX strap assist 5x4 with 5 second holds to promote lumbopelvic and femoral control to gradually step up onto a step with less pain.   Upgraded number of sets  Standing R hip abduction 10x2. No pain  Standing leg press (to simulate stepping up onto a step) resisting double blue band 10x2. Cues for R LE control  SLS on R LE 5x2 with 5 second holds. Able to maintain pelvic control.   Seated manually resisted bilateral shoulder extension isometrics with pt holding PVC bar 10x2 with 5 second holds    Improved exercise technique, movement at target joints, use of target muscles after min to mod verbal, visual, tactile cues.     Pt making very good progress with tolerating load onto her R hip based on pt subjective reports. Improved  R glute med muscle use with SLS on R LE for pelvic control. Continued working on femoral and pelvic control and increasing trunk muscle strength to promote ability to step up onto a step with less pain.          PT Long Term Goals - 10/22/16 1644      PT LONG TERM GOAL #1   Title Pt will have a decrease in R lateral hip and SI joint pain to 4/10 or less at worst to promote ability negotiate stairs, stand up after prologed sitting, and tolerate lying on her R side when sleeping.   Baseline 8/10 R lateral hip and SI joint pain at worst (08/04/2016); 4/10 R lateral hip and SI/PSIS joint pain at worst (09/01/2016); 6/10 R PSIS and R lateral hip pain at worst for the past 7 days (09/17/2016);  4/10 R lateral hip and 3/10 R PSIS pain at most for the past 7 days (09/30/2016)   5-6/10 with stairs, 2/10 without stairs (10/22/2016)     Time 4   Period Weeks   Status Partially Met     PT LONG TERM GOAL #2   Title Patient will improve bilateral glute med and max strength by at least 1/2 MMT to promote ability to negotiate stairs with less pain.    Time 4   Period Weeks   Status Achieved     PT LONG TERM GOAL #3   Title Patient will improve her LEFS score by at least 9 points as a demonstration of improved function.    Baseline 39/80 (08/04/2016); 47/80 (09/01/2016); 56/80 (09/17/2016)   Time 4   Period Weeks   Status Achieved     PT LONG TERM GOAL #4   Title Patient will be able to ascend and descend 4 regular steps at least 3 times with bilateral rail assist with 2/10 or less R lateral hip and PSIS pain to promote ability to negotiate stairs at home.    Baseline At least 3/10 R PSIS and 4/10 R lateral hip pain when negotiating stairs at home (09/30/2016); 5-6/10 when trying to negotiate stairs at home 10/22/2016.   Time 4   Period Weeks   Status On-going               Plan - 11/13/16 1701    Clinical Impression Statement Pt making very good progress with tolerating load onto her R hip based on pt  subjective reports.  Improved R glute med muscle use with SLS on R LE for pelvic control. Continued working on femoral and pelvic control and increasing trunk muscle strength to promote ability to step up onto a step with less pain.     Rehab Potential Good   Clinical Impairments Affecting Rehab Potential Chronicity of condition, bilateral knee pain   PT Frequency 2x / week   PT Duration Other (comment)  5 weeks   PT Treatment/Interventions Manual techniques;Therapeutic activities;Therapeutic exercise;Dry needling;Patient/family education;Neuromuscular re-education;Ultrasound;Iontophoresis 18m/ml Dexamethasone;Electrical Stimulation;Aquatic Therapy  Modalities if appropriate   PT Next Visit Plan hip strengthening, femoral control, lumbopelvic strengthening and control   Consulted and Agree with Plan of Care Patient      Patient will benefit from skilled therapeutic intervention in order to improve the following deficits and impairments:  Pain, Improper body mechanics, Decreased strength  Visit Diagnosis: Pain in right hip  Muscle weakness (generalized)     Problem List Patient Active Problem List   Diagnosis Date Noted  . Right hip pain 07/29/2016  . Osteoarthritis 04/02/2016  . Screening for cervical cancer 10/08/2015  . Pelvic pain in female 09/10/2015  . Midline thoracic back pain 08/15/2015  . Microscopic hematuria 05/08/2015  . Vertigo 10/27/2014  . [place] 11/19[place]2012  . Routine physical examination 10/27/2012  . Screening for colon cancer 10/27/2012  . Hyperlipidemia 02/17/2011  . Osteopenia 02/17/2011    MJoneen BoersPT, DPT   11/13/2016, 5:55 PM  [place] [facility] [address] [place] [address]Phone: [phone]  Fax:  [phone] Name: LDinora HemmMRN: [id number] of Birth: [birth year]

## 2016-11-17 ENCOUNTER — Ambulatory Visit: Payer: Medicare Other

## 2016-11-17 DIAGNOSIS — M25551 Pain in right hip: Secondary | ICD-10-CM | POA: Diagnosis not present

## 2016-11-17 DIAGNOSIS — M6281 Muscle weakness (generalized): Secondary | ICD-10-CM

## 2016-11-17 NOTE — Therapy (Signed)
[place] [facility] [address], [place], [address] Phone: [phone]   Fax:  [phone]  Physical Therapy Treatment  Patient Details  Name: Deborah Mcconnell MRN: [id number] Date of Birth: [birth year] Referring Provider: Burnard Hawthorne, FNP  Encounter Date: 11/17/2016      PT End of Session - 11/17/16 1602    Visit Number 25   Number of Visits 35   Date for PT Re-Evaluation 11/27/16   Authorization Type 7   Authorization Time Period of 10 g code   PT Start Time 1602   PT Stop Time 1644   PT Time Calculation (min) 42 min   Activity Tolerance Patient tolerated treatment well   Behavior During Therapy [facility] for tasks assessed/performed      Past Medical History:  Diagnosis Date  . Arthritis    osteoarthritis  . Cancer ([place])   . Hyperlipidemia   . Osteopenia    previously taking Evista and Actonel  . Osteoporosis   . Vertigo     Past Surgical History:  Procedure Laterality Date  . CHOLECYSTECTOMY    . lt breast lumpectomy  2014    There were no vitals filed for this visit.      Subjective Assessment - 11/17/16 1603    Subjective Pt states that she feels very close to getting her R hip fixed. The steps are still the weak link for the whole thing. Feels tightness in the back of her knee which she felt when she woke up this morning and before that. Is more aware of it in the mornings.  No R hip pain unless rolling over onto it or when doing the stairs.   Pertinent History R hip pain. Pt states she is currently taking prednisone which relevied a lot of her R hip pain. Last dose was yesterday.  R hip pain began around November 2017. Felt like her pain was going to go away at that time but it continued. Pain is located from her R PSIS to R lateral hip and proximal lateral thigh. Feels like her pain started at her R PSIS. Difficulty lying on her R side (feels really sore).  Feels a burning sensation in her  R sacroiliac area and a steady ache in her R hip (greater trochanter area).    Patient Stated Goals Go back to walking up stairs with less pain, be able to sleep better.    Currently in Pain? No/denies   Pain Score 0-No pain   Pain Onset More than a month ago                                 PT Education - 11/17/16 1607    Education provided Yes   Education Details ther-ex   [employer]) Educated Patient   Methods Explanation;Demonstration;Tactile cues;Verbal cues   Comprehension Returned demonstration;Verbalized understanding         Objectives  Hold off regular pallof press exercise (lateral). Can perform straight version.      There-ex  Seated hip adductor ball squeeze with glute max squeeze 10x10 seconds  Slight decrease in R posterior knee symptoms  Standing hip machine: R hip extension plate 39fr [id number] max strengthening   Seated manually resisted bilateral shoulder extension isometrics with pt holding PVC bar 10x2 with 5 second holds   Single leg stands with opposite tip toe assist and with low rows resisting  red band 10x5 seconds for 2 sets R LE, and 10x5 seconds for L LEto promote trunk, and pelvic control at the same time.    Standing leg press (to simulate stepping up onto a step) resisting double blue band 10x3. Cues for R LE control. Upgraded number or sets  No R posterior knee discomfort afterwards   Forward step up half way onto 3 inch step with R LE with L tip toe assist and TRX strap assist 5x4 with 5 second holdsto promote lumbopelvic and femoral control to gradually step up onto a step with less pain.   Then forward step up onto 3 inch step with TRX strap assist 5x. Slight R hip sensation, no pain.     SLS on R LE 5x2 with 5 second holds. Able to maintain pelvic control.   Lateral step up onto Air Ex pad 10x3  Standing R hip abduction 10x2.   Static mini backwards lunge to work R LE, cues for  femoral control 5x2    Improved exercise technique, movement at target joints, use of target muscles after min to mod verbal, visual, tactile cues.      Able to step up onto a 3 inch step with TRX strap assist wihthout increase in pain. Improving load tolerance R lateral hip. Still demonstrates difficulty with pelvic and femoral control but improving.            PT Long Term Goals - 10/22/16 1644      PT LONG TERM GOAL #1   Title Pt will have a decrease in R lateral hip and SI joint pain to 4/10 or less at worst to promote ability negotiate stairs, stand up after prologed sitting, and tolerate lying on her R side when sleeping.   Baseline 8/10 R lateral hip and SI joint pain at worst (08/04/2016); 4/10 R lateral hip and SI/PSIS joint pain at worst (09/01/2016); 6/10 R PSIS and R lateral hip pain at worst for the past 7 days (09/17/2016);  4/10 R lateral hip and 3/10 R PSIS pain at most for the past 7 days (09/30/2016)   5-6/10 with stairs, 2/10 without stairs (10/22/2016)     Time 4   Period Weeks   Status Partially Met     PT LONG TERM GOAL #2   Title Patient will improve bilateral glute med and max strength by at least 1/2 MMT to promote ability to negotiate stairs with less pain.    Time 4   Period Weeks   Status Achieved     PT LONG TERM GOAL #3   Title Patient will improve her LEFS score by at least 9 points as a demonstration of improved function.    Baseline 39/80 (08/04/2016); 47/80 (09/01/2016); 56/80 (09/17/2016)   Time 4   Period Weeks   Status Achieved     PT LONG TERM GOAL #4   Title Patient will be able to ascend and descend 4 regular steps at least 3 times with bilateral rail assist with 2/10 or less R lateral hip and PSIS pain to promote ability to negotiate stairs at home.    Baseline At least 3/10 R PSIS and 4/10 R lateral hip pain when negotiating stairs at home (09/30/2016); 5-6/10 when trying to negotiate stairs at home 10/22/2016.   Time 4   Period Weeks   Status  On-going               Plan - 11/17/16 1601    Clinical Impression Statement Able  to step up onto a 3 inch step with TRX strap assist wihthout increase in pain. Improving load tolerance R lateral hip. Still demonstrates difficulty with pelvic and femoral control but improving.    Rehab Potential Good   Clinical Impairments Affecting Rehab Potential Chronicity of condition, bilateral knee pain   PT Frequency 2x / week   PT Duration Other (comment)  5 weeks   PT Treatment/Interventions Manual techniques;Therapeutic activities;Therapeutic exercise;Dry needling;Patient/family education;Neuromuscular re-education;Ultrasound;Iontophoresis 42m/ml Dexamethasone;Electrical Stimulation;Aquatic Therapy  Modalities if appropriate   PT Next Visit Plan hip strengthening, femoral control, lumbopelvic strengthening and control   Consulted and Agree with Plan of Care Patient      Patient will benefit from skilled therapeutic intervention in order to improve the following deficits and impairments:  Pain, Improper body mechanics, Decreased strength  Visit Diagnosis: Pain in right hip  Muscle weakness (generalized)     Problem List Patient Active Problem List   Diagnosis Date Noted  . Right hip pain 07/29/2016  . Osteoarthritis 04/02/2016  . Screening for cervical cancer 10/08/2015  . Pelvic pain in female 09/10/2015  . Midline thoracic back pain 08/15/2015  . Microscopic hematuria 05/08/2015  . Vertigo 10/27/2014  . Malignant ne[place] of [place] ([place]) ([place] 11/19/2012  . Routine physical examination 10/27/2012  . Screening for colon cancer 10/27/2012  . Hyperlipidemia 02/17/2011  . Osteopenia 02/17/2011    MJoneen BoersPT, DPT   11/17/2016, 4:49 PM  [facility] [address] [place] [address]Phone: [phone]  Fax:  [phone] Name: LYeng FrankieMRN: [id number] of Birth:  205-26-1952

## 2016-11-18 ENCOUNTER — Ambulatory Visit: Payer: Medicare Other

## 2016-11-20 ENCOUNTER — Ambulatory Visit
Admission: RE | Admit: 2016-11-20 | Discharge: 2016-11-20 | Disposition: A | Discharge status: Home / Self Care | Payer: Medicare Other | Source: Ambulatory Visit | Attending: Urology | Admitting: Urology

## 2016-11-20 DIAGNOSIS — N281 Cyst of kidney, acquired: Secondary | ICD-10-CM | POA: Insufficient documentation

## 2016-11-20 DIAGNOSIS — D259 Leiomyoma of uterus, unspecified: Secondary | ICD-10-CM | POA: Diagnosis not present

## 2016-11-20 DIAGNOSIS — R3129 Other microscopic hematuria: Secondary | ICD-10-CM | POA: Diagnosis not present

## 2016-11-20 HISTORY — DX: Malignant neoplasm of unspecified site of left female breast: C50.912

## 2016-11-20 LAB — POCT I-STAT CREATININE: Creatinine, Ser: 0.7 mg/dL (ref 0.44–1.00)

## 2016-11-20 MED ORDER — IOPAMIDOL (ISOVUE-300) INJECTION 61%
125.0000 mL | Freq: Once | INTRAVENOUS | Status: AC | PRN
  Administered 2016-11-20: 125 mL via INTRAVENOUS

## 2016-11-25 ENCOUNTER — Ambulatory Visit: Payer: Medicare Other

## 2016-11-25 DIAGNOSIS — M6281 Muscle weakness (generalized): Secondary | ICD-10-CM | POA: Diagnosis not present

## 2016-11-25 DIAGNOSIS — M25551 Pain in right hip: Secondary | ICD-10-CM

## 2016-11-25 NOTE — Therapy (Signed)
[facility] [address] [place], [address] Phone: [phone]   Fax:  [phone]  Physical Therapy Treatment And Progress Report  Patient Details  Name: Deborah Mcconnell MRN: [id number] Date of Birth: 05-04-1951 Referring Provider: Burnard Hawthorne, FNP  Encounter Date: 11/25/2016      PT End of Session - 11/25/16 0946    Visit Number 26   Number of Visits 47   Date for PT Re-Evaluation 01/08/17   Authorization Type 1   Authorization Time Period of 10 g code   PT Start Time 0948   PT Stop Time 1031   PT Time Calculation (min) 43 min   Activity Tolerance Patient tolerated treatment well   Behavior During Therapy [facility] for tasks assessed/performed      Past Medical History:  Diagnosis Date  . Arthritis    osteoarthritis  . Breast cancer, left ([place]) 2014   Hx Lumpectomy and Rad tx's.  Marland Kitchen Hyperlipidemia   . Osteopenia    previously taking Evista and Actonel  . Osteoporosis   . Vertigo     Past Surgical History:  Procedure Laterality Date  . CHOLECYSTECTOMY    . lt breast lumpectomy  2014    There were no vitals filed for this visit.      Subjective Assessment - 11/25/16 0949    Subjective R hip feels pretty good other than the steps (stairs). The first couple of steps she can manage but the more she does the steps, her pain increases. Had a CT of her abdomen and pelvis last week. Having another cancer screening tomorrow because she has elevated red blood cell count in her urine. Doctors do not know where it is coming from.   No SI pain, just a sensation and awareness.  No R lateral hip pain currently. Feels better because when she stands up from sitting, does not have to wait a couple of seconds. Right now, it does not hurt at all.  2/10 R lateral hip pain at worst overall, 4/10 R lateral hip pain at worst for the past 7 days with the stairs.   Wants to try PT 1x every 2 weeks while  performing HEP at home to see how she does.    Pertinent History R hip pain. Pt states she is currently taking prednisone which relevied a lot of her R hip pain. Last dose was yesterday.  R hip pain began around November 2017. Felt like her pain was going to go away at that time but it continued. Pain is located from her R PSIS to R lateral hip and proximal lateral thigh. Feels like her pain started at her R PSIS. Difficulty lying on her R side (feels really sore).  Feels a burning sensation in her R sacroiliac area and a steady ache in her R hip (greater trochanter area).    Patient Stated Goals Go back to walking up stairs with less pain, be able to sleep better.    Currently in Pain? No/denies   Pain Score 0-No pain   Pain Onset More than a month ago                                 PT Education - 11/25/16 1009    Education provided Yes   Education Details ther-ex, HEP   Person(s) Educated Patient   Methods Explanation;Demonstration;Tactile cues;Verbal cues;Handout   Comprehension  Returned demonstration;Verbalized understanding        Objectives  Hold off regular pallof press exercise (lateral). Can perform straight version.       There-ex  Reviewed plan of care: 1x every 2 weeks for 6 weeks (but 1-2x/week for 6 weeks just in case more sessions are needed) to make sure pt is heading in the right direction.   Seated bilateral shoulder extension resisting green band 10x3 with 5 second holds.   L hip hike to work R glute med muscle 10x3 with 5 second holds    Standing leg press (to simulate stepping up onto a step) resisting double blue band 10x3.    Standing hip machine: R hip extension plate 24fr [id number] max strengthening  Increase resistance next session appropriate   Forward step up half way onto 3 inch step with R LE with L tip toe assist and TRX strap assist 5x2with 5 second holdsto promote lumbopelvic and femoral control  to gradually step up onto a step with less pain.   Then half way onto 1st regular step with R LE with bilateral UE assist 10x5 seconds no pain.    Then forward step up onto 1st regular step 3x with R LE and bilateral UE assist, cues for femoral control. No increase in pain.    Improved exercise technique, movement at target joints, use of target muscles after min to mod verbal, visual, tactile cues.      Able to step up onto a regular step with bilateral UE assist without increase in pain today. Pt overall improving load tolerance to her R lateral hip since initial evaluation. Still demonstrates difficulty with femoral and pelvic control but improving. Improved pain level for R hip and R PSIS area compared to previous sessions. Pt still demonstrates R hip and R low back pain, difficulty with femoral and pelvic control (but improving), and difficulty with stair negotiation secondary to hip pain. Patient will benefit from continued skilled physical therapy services to address the aforementioned deficits.        PT Long Term Goals - 11/25/16 1923      PT LONG TERM GOAL #1   Title Pt will have a decrease in R lateral hip and SI joint pain to 4/10 or less at worst to promote ability negotiate stairs, stand up after prologed sitting, and tolerate lying on her R side when sleeping.   Baseline 8/10 R lateral hip and SI joint pain at worst (08/04/2016); 4/10 R lateral hip and SI/PSIS joint pain at worst (09/01/2016); 6/10 R PSIS and R lateral hip pain at worst for the past 7 days (09/17/2016);  4/10 R lateral hip and 3/10 R PSIS pain at most for the past 7 days (09/30/2016)   5-6/10 with stairs, 2/10 without stairs (10/22/2016); 4/10 at worst (11/25/2016)   Time 6   Period Weeks   Status Partially Met     PT LONG TERM GOAL #2   Title Patient will improve bilateral glute med and max strength by at least 1/2 MMT to promote ability to negotiate stairs with less pain.    Time 4   Period Weeks   Status  Achieved     PT LONG TERM GOAL #3   Title Patient will improve her LEFS score by at least 9 points as a demonstration of improved function.    Baseline 39/80 (08/04/2016); 47/80 (09/01/2016); 56/80 (09/17/2016)   Time 4   Period Weeks   Status Achieved     PT  LONG TERM GOAL #4   Title Patient will be able to ascend and descend 4 regular steps at least 3 times with bilateral rail assist with 2/10 or less R lateral hip and PSIS pain to promote ability to negotiate stairs at home.    Baseline At least 3/10 R PSIS and 4/10 R lateral hip pain when negotiating stairs at home (09/30/2016); 5-6/10 when trying to negotiate stairs at home 10/22/2016;  Pain with stairs (12-11-16)   Time 6   Period Weeks   Status On-going               Plan - 2016/12/11 0947    Clinical Impression Statement Able to step up onto a regular step with bilateral UE assist without increase in pain today. Pt overall improving load tolerance to her R lateral hip since initial evaluation. Still demonstrates difficulty with femoral and pelvic control but improving. Improved pain level for R hip and R PSIS area compared to previous sessions. Pt still demonstrates R hip and R low back pain, difficulty with femoral and pelvic control (but improving), and difficulty with stair negotiation secondary to hip pain. Patient will benefit from continued skilled physical therapy services to address the aforementioned deficits.    Clinical Presentation Stable   Clinical Decision Making Low   Rehab Potential Good   Clinical Impairments Affecting Rehab Potential Chronicity of condition, bilateral knee pain   PT Frequency 2x / week  1x every 2 weeks but can be 2x/week if needed   PT Duration 6 weeks   PT Treatment/Interventions Manual techniques;Therapeutic activities;Therapeutic exercise;Dry needling;Patient/family education;Neuromuscular re-education;Ultrasound;Iontophoresis 52m/ml Dexamethasone;Electrical Stimulation;Aquatic Therapy   Modalities if appropriate   PT Next Visit Plan hip strengthening, femoral control, lumbopelvic strengthening and control   Consulted and Agree with Plan of Care Patient      Patient will benefit from skilled therapeutic intervention in order to improve the following deficits and impairments:  Pain, Improper body mechanics, Decreased strength  Visit Diagnosis: Pain in right hip - Plan: PT plan of care cert/re-cert  Muscle weakness (generalized) - Plan: PT plan of care cert/re-cert       G-Codes - [id number]    Functional Assessment Tool Used (Outpatient Only)  clinical presentation, patient interview   Functional Limitation Mobility: Walking and moving around   Mobility: Walking and Moving Around Current Status [phone] At least 20 percent but less than 40 percent impaired, limited or restricted   Mobility: Walking and Moving Around Goal Status [phone] At least 1 percent but less than 20 percent impaired, limited or restricted      Problem List Patient Active Problem List   Diagnosis Date Noted  . Right hip pain 07/29/2016  . Osteoarthritis 04/02/2016  . Screening for cervical cancer 10/08/2015  . Pelvic pain in female 09/10/2015  . Midline thoracic back pain 08/15/2015  . Microscopic hematuria 05/08/2015  . Vertigo 10/27/2014  . [place]  . Routine physical examination 10/27/2012  . Screening for colon cancer 10/27/2012  . Hyperlipidemia 02/17/2011  . Osteopenia 02/17/2011     Thank you for your referral.  MJoneen BoersPT, DPT   5Jun 14, 2018 7:37 PM  [facility] [address] [place] [address]Phone: [phone]  Fax:  [phone] Name: Deborah BiltonMRN: [id number] of Birth: 206-12-1950

## 2016-11-25 NOTE — Patient Instructions (Addendum)
   Sitting on a chair,    Pull the green band with both hands to feel your abdominal muscles work.     Hold for 5 seconds    Repeat 10 times.     Perform 3 sets daily.     Standing with your right foot on a mat, with both hands holding onto something sturdy for support:   Hike your left hip up to feel the right side of your hip muscles work.    Hold for 5 seconds,    Then rest by placing your left tip toe onto the floor.      Repeat 10 times.    Perform 3 sets daily.     Standing and holding onto something sturdy for support    Press down with your right foot against a double blue band resistance to the floor.    Maintain neutral thigh   Repeat 10 times.    Perform 3 sets daily.

## 2016-11-26 ENCOUNTER — Ambulatory Visit (INDEPENDENT_AMBULATORY_CARE_PROVIDER_SITE_OTHER): Payer: Medicare Other | Admitting: Urology

## 2016-11-26 ENCOUNTER — Ambulatory Visit: Payer: Medicare Other

## 2016-11-26 ENCOUNTER — Encounter: Payer: Self-pay | Admitting: Urology

## 2016-11-26 VITALS — BP 134/72 | HR 76 | Ht 71.5 in | Wt 176.0 lb

## 2016-11-26 DIAGNOSIS — N390 Urinary tract infection, site not specified: Secondary | ICD-10-CM

## 2016-11-26 DIAGNOSIS — R3129 Other microscopic hematuria: Secondary | ICD-10-CM

## 2016-11-26 DIAGNOSIS — N952 Postmenopausal atrophic vaginitis: Secondary | ICD-10-CM

## 2016-11-26 LAB — URINALYSIS, COMPLETE
Bilirubin, UA: NEGATIVE
Glucose, UA: NEGATIVE
Ketones, UA: NEGATIVE
LEUKOCYTES UA: NEGATIVE
Nitrite, UA: NEGATIVE
PH UA: 7 (ref 5.0–7.5)
PROTEIN UA: NEGATIVE
Specific Gravity, UA: <1.005 — ABNORMAL LOW (ref 1.005–1.030)
Urobilinogen, Ur: 0.2 mg/dL (ref 0.2–1.0)

## 2016-11-26 MED ORDER — CEPHALEXIN 250 MG PO CAPS
500.0000 mg | ORAL_CAPSULE | Freq: Once | ORAL | Status: AC
  Administered 2016-11-26: 500 mg via ORAL

## 2016-11-26 MED ORDER — LIDOCAINE HCL 2 % EX GEL
1.0000 "application " | Freq: Once | CUTANEOUS | Status: AC
  Administered 2016-11-26: 1 via URETHRAL

## 2016-11-26 NOTE — Progress Notes (Signed)
11/26/16  CC:  Chief Complaint  Patient presents with  . Cysto    HPI: The patient is a 66 year old female presents today for persistent microscopic hematuria. She appears to have a renal ultrasound cystoscopy that was negative. She now underwent a formal hematuria workup with a CT urogram. She presents today for cystoscopy. CT urogram was unremarkable.  She is also on prophylactic Keflex 250 mg that she takes only after intercourse. Since starting this regimen, she has not had another urinary tract infection.  Background history Patient with recurrent UTI's.  Patient states that she has had 3 urinary tract infections over the last year.  Her symptoms with a urinary tract infection consist of dysuria and urgency.  She denies suprapubic pain, back pain, abdominal pain or flank pain.  She has not had any recent fevers, chills, nausea or vomiting.  She does not have a history of nephrolithiasis, GU surgery or GU trauma.   Reviewing her records,  she has had two documented UTI's with E. Coli.    She is sexually active.  She has noted a correlation with her urinary tract infections and sexual intercourse.  She is post menopausal.   She does not engage in anal sex.   She is voiding before and after sex.   She denies constipation and/or diarrhea.   She does engage in good perineal hygiene. She does not take tub baths.    She does not have incontinence.  She is not having pain with bladder filling.  She had a renal ultrasound on 03/19/2016 noted bilateral parapelvic cysts. No other significant abnormality seen in the kidneys.  I have independently reviewed the films.  Cystoscopy performed on 04/16/2016 was negative.  She is drinking 1 glass of water daily.  One cup of coffee.    At her visit 3 months ago, her risk factors for recurrent urinary tract infections were identified as sexual activity, post menopausal state and limited fluid intake.  She was instructed to increase her liquid intake to 8 cups  of water daily, start cranberry tablets or drink cranberry juice, start probiotics and vitamin C 1000 mg daily.  She was not a candidate for vaginal estrogen cream due to her history of breast cancer.   The signs and symptoms of urinary tract infection were discussed with the patient and she was asked to contact us with any of these symptoms so that we may collect a catheterized specimen for analysis and possible culture.   There were no vitals taken for this visit. NED. A&Ox3.   No respiratory distress   Abd soft, NT, ND Normal external genitalia with patent urethral meatus  Cystoscopy Procedure Note  Patient identification was confirmed, informed consent was obtained, and patient was prepped using Betadine solution.  Lidocaine jelly was administered per urethral meatus.    Preoperative abx where received prior to procedure.    Procedure: Vaginal atrophy noted prior to placement of cystoscope.  - Flexible cystoscope introduced, without any difficulty.   - Thorough search of the bladder revealed:    normal urethral meatus    normal urothelium    no stones    no ulcers     no tumors    no urethral polyps    no trabeculation  - Ureteral orifices were normal in position and appearance.  Post-Procedure: - Patient tolerated the procedure well  Assessment/ Plan:  1. Microscopic hematuria -Negative workup of this time. Repeat urinalysis in one year. She's had multiple negative hematuria workup  is at this point. Will likely only need repeat hematuria workup if her hematuria becomes gross.  2. Recurrent UTI's The patient will contact the office if she develops another symptomatic urinary tract infection for urine culture. Continue post intercourse Keflex 250 mg as needed. Follow up annually.  3. Vaginal atrophy - not a candidate for vaginal estrogen cream due to breast cancer   Nickie Retort, MD

## 2016-12-09 ENCOUNTER — Ambulatory Visit: Payer: Medicare Other | Attending: Family

## 2016-12-09 DIAGNOSIS — M25551 Pain in right hip: Secondary | ICD-10-CM | POA: Insufficient documentation

## 2016-12-09 DIAGNOSIS — M6281 Muscle weakness (generalized): Secondary | ICD-10-CM | POA: Insufficient documentation

## 2016-12-09 NOTE — Therapy (Signed)
[facility] [address], [place], [address] Phone: [phone]   Fax:  [phone]  Physical Therapy Treatment  Patient Details  Name: Deborah Mcconnell MRN: [id number] Date of Birth: Nov 24, 1950 Referring Provider: Burnard Hawthorne, FNP  Encounter Date: 12/09/2016      PT End of Session - 12/09/16 1435    Visit Number 27   Number of Visits 42   Date for PT Re-Evaluation 01/08/17   Authorization Type 2   Authorization Time Period of 10 g code   PT Start Time 1435   PT Stop Time 1500   PT Time Calculation (min) 25 min   Activity Tolerance Patient tolerated treatment well   Behavior During Therapy [facility] for tasks assessed/performed      Past Medical History:  Diagnosis Date  . Arthritis    osteoarthritis  . Breast cancer, left ([place]) 2014   Hx Lumpectomy and Rad tx's.  Marland Kitchen Hyperlipidemia   . Osteopenia    previously taking Evista and Actonel  . Osteoporosis   . Vertigo     Past Surgical History:  Procedure Laterality Date  . CHOLECYSTECTOMY    . lt breast lumpectomy  2014    There were no vitals filed for this visit.      Subjective Assessment - 12/09/16 1437    Subjective R hip is not bad. I'm pleased. It's so nice that I can cross my legs. The steps are easy in the morning but gets harder as the day goes on and the more she does stairs.  For the most part, I don't have a lot of pain unless going up 14 steps or at night. Goes up a set of 4 sets at work pretty easily. Still takes an elevator when she has to go up a whole flight of steps.  The R PSIS has not bothered her. Still a little sore R lateral hip.  No pain currently.  3/10 R hip pain at most, including stairs.     Pertinent History R hip pain. Pt states she is currently taking prednisone which relevied a lot of her R hip pain. Last dose was yesterday.  R hip pain began around November 2017. Felt like her pain was going to go away at that  time but it continued. Pain is located from her R PSIS to R lateral hip and proximal lateral thigh. Feels like her pain started at her R PSIS. Difficulty lying on her R side (feels really sore).  Feels a burning sensation in her R sacroiliac area and a steady ache in her R hip (greater trochanter area).    Patient Stated Goals Go back to walking up stairs with less pain, be able to sleep better.    Currently in Pain? No/denies   Pain Score 0-No pain   Pain Onset More than a month ago                                 PT Education - 12/09/16 1444    Education provided Yes   Education Details ther-ex   [employer]) Educated Patient   Methods Explanation;Demonstration;Tactile cues;Verbal cues   Comprehension Verbalized understanding;Returned demonstration         Objectives  Hold off regular pallof press exercise (lateral). Can perform straight version.       There-ex  L hip hike to work R glute med muscle 10  with 5 second holds   Static mini reverse lunge with bilateral UE assist 10x2 R LE    forward step up onto 3 inch step 5x with bilateral UE assist. No pain  Then onto 1st regular step 5x with R LE and bilateral UE assist. No pain but weak femoral control   Then 1/2 way onto 1st regular step with bilateral UE asist 5x5 seconds, cues for femoral control. R lateral thigh sensation felt.    Standing hip machine: R hip extension plate 38([id number] resistance) for 10x75for glute max strengthening            Standing L hip abduction at hip machine plate 40 for 70V. Good R glute med muscle use felt in closed chain position.    Improved exercise technique, movement at target joints, use of target muscles after min to mod verbal, visual, tactile cues.    Decreased R lateral hip pain to 3/10 at worst for the past 7 days. Pt overall improving function and ability to negotiate stairs based on pt subjective reports. Still demonstrates difficulty with  femoral control and endurance to do so and pt would benefit from continued skilled physical therapy services to address aforementioned deficit.            PT Long Term Goals - 11/25/16 1923      PT LONG TERM GOAL #1   Title Pt will have a decrease in R lateral hip and SI joint pain to 4/10 or less at worst to promote ability negotiate stairs, stand up after prologed sitting, and tolerate lying on her R side when sleeping.   Baseline 8/10 R lateral hip and SI joint pain at worst (08/04/2016); 4/10 R lateral hip and SI/PSIS joint pain at worst (09/01/2016); 6/10 R PSIS and R lateral hip pain at worst for the past 7 days (09/17/2016);  4/10 R lateral hip and 3/10 R PSIS pain at most for the past 7 days (09/30/2016)   5-6/10 with stairs, 2/10 without stairs (10/22/2016); 4/10 at worst (11/25/2016)   Time 6   Period Weeks   Status Partially Met     PT LONG TERM GOAL #2   Title Patient will improve bilateral glute med and max strength by at least 1/2 MMT to promote ability to negotiate stairs with less pain.    Time 4   Period Weeks   Status Achieved     PT LONG TERM GOAL #3   Title Patient will improve her LEFS score by at least 9 points as a demonstration of improved function.    Baseline 39/80 (08/04/2016); 47/80 (09/01/2016); 56/80 (09/17/2016)   Time 4   Period Weeks   Status Achieved     PT LONG TERM GOAL #4   Title Patient will be able to ascend and descend 4 regular steps at least 3 times with bilateral rail assist with 2/10 or less R lateral hip and PSIS pain to promote ability to negotiate stairs at home.    Baseline At least 3/10 R PSIS and 4/10 R lateral hip pain when negotiating stairs at home (09/30/2016); 5-6/10 when trying to negotiate stairs at home 10/22/2016;  Pain with stairs (11/25/2016)   Time 6   Period Weeks   Status On-going               Plan - 12/09/16 1444    Clinical Impression Statement Decreased R lateral hip pain to 3/10 at worst for the past 7 days. Pt  overall improving function and  ability to negotiate stairs based on pt subjective reports. Still demonstrates difficulty with femoral control and endurance to do so and pt would benefit from continued skilled physical therapy services to address aforementioned deficit.    Clinical Presentation Stable   Clinical Presentation due to: improving symptoms    Clinical Decision Making Low   Rehab Potential Good   Clinical Impairments Affecting Rehab Potential Chronicity of condition, bilateral knee pain   PT Frequency 2x / week  1x every 2 weeks but can be 2x/week if needed   PT Duration 6 weeks   PT Treatment/Interventions Manual techniques;Therapeutic activities;Therapeutic exercise;Dry needling;Patient/family education;Neuromuscular re-education;Ultrasound;Iontophoresis 29m/ml Dexamethasone;Electrical Stimulation;Aquatic Therapy  Modalities if appropriate   PT Next Visit Plan hip strengthening, femoral control, lumbopelvic strengthening and control   Consulted and Agree with Plan of Care Patient      Patient will benefit from skilled therapeutic intervention in order to improve the following deficits and impairments:  Pain, Improper body mechanics, Decreased strength  Visit Diagnosis: Pain in right hip  Muscle weakness (generalized)     Problem List Patient Active Problem List   Diagnosis Date Noted  . Right hip pain 07/29/2016  . Osteoarthritis 04/02/2016  . Screening for cervical cancer 10/08/2015  . Pelvic pain in female 09/10/2015  . Midline thoracic back pain 08/15/2015  . Microscopic hematuria 05/08/2015  . Vertigo 10/27/2014  . [place]  . Routine physical examination 10/27/2012  . Screening for colon cancer 10/27/2012  . Hyperlipidemia 02/17/2011  . Osteopenia 02/17/2011    MJoneen BoersPT, DPT   12/09/2016, 3:09 PM  [facility] [address] [place] [address]Phone: [phone]  Fax:  [phone] Name: LTashonna DescoteauxMRN: [id number] of Birth: [birth year]

## 2016-12-23 ENCOUNTER — Ambulatory Visit: Payer: Medicare Other

## 2016-12-23 DIAGNOSIS — M6281 Muscle weakness (generalized): Secondary | ICD-10-CM

## 2016-12-23 DIAGNOSIS — M25551 Pain in right hip: Secondary | ICD-10-CM

## 2016-12-23 NOTE — Therapy (Signed)
[place] [address] [place], [address] Phone: [phone]   Fax:  [phone]  Physical Therapy Treatment  Patient Details  Name: Deborah Mcconnell MRN: [id number] Date of Birth: July 19, 1950 Referring Provider: Burnard Hawthorne, FNP  Encounter Date: 12/23/2016      PT End of Session - 12/23/16 1435    Visit Number 28   Number of Visits 39   Date for PT Re-Evaluation 01/08/17   Authorization Type 3   Authorization Time Period of 10 g code   PT Start Time 1435   PT Stop Time 1516   PT Time Calculation (min) 41 min   Activity Tolerance Patient tolerated treatment well   Behavior During Therapy [facility] for tasks assessed/performed      Past Medical History:  Diagnosis Date  . Arthritis    osteoarthritis  . Breast cancer, left ([place]) 2014   Hx Lumpectomy and Rad tx's.  Marland Kitchen Hyperlipidemia   . Osteopenia    previously taking Evista and Actonel  . Osteoporosis   . Vertigo     Past Surgical History:  Procedure Laterality Date  . CHOLECYSTECTOMY    . lt breast lumpectomy  2014    There were no vitals filed for this visit.      Subjective Assessment - 12/23/16 1436    Subjective R hip and PSIS are about the same. Feels like a burning sensation feeling, no pain. Has been doing 4 steps at school which is going much better.  Still feels symptoms at the end of the day.  Not having any trouble getting up from a chair or out of the car. 3-4 steps are fine, Has a hard time with more steps. Ok doing the 3 steps at home in the morning.    Pertinent History R hip pain. Pt states she is currently taking prednisone which relevied a lot of her R hip pain. Last dose was yesterday.  R hip pain began around November 2017. Felt like her pain was going to go away at that time but it continued. Pain is located from her R PSIS to R lateral hip and proximal lateral thigh. Feels like her pain started at her R PSIS.  Difficulty lying on her R side (feels really sore).  Feels a burning sensation in her R sacroiliac area and a steady ache in her R hip (greater trochanter area).    Patient Stated Goals Go back to walking up stairs with less pain, be able to sleep better.    Currently in Pain? No/denies   Pain Score 0-No pain   Pain Onset More than a month ago                                 PT Education - 12/23/16 1504    Education provided Yes   Education Details ther-ex   [employer]) Educated Patient   Methods Explanation;Demonstration;Tactile cues;Verbal cues   Comprehension Returned demonstration;Verbalized understanding        Objectives  Hold off regular pallof press exercise (lateral). Can perform straight version.       There-ex   L hip hike to work R glute med muscle 10 with 5 second holds   Forward step up onto 1st regular step with bilateral UE assist, and with L hip hike and R femoral control 5x2. No R hip pain  Standing hip machine: R hip extension  plate 100(upgraded resistance) for 10x72fr glute max strengthening  Standing R hip abduction at hip machine plate 40 for 116X then 5x .   Static mini reverse lunge with bilateral UE assist 10x2 R LE  Forward mini lunge 32 ft x 2 with emphasis on femoral control   Side step with mini squat 32 ft to the R and 32 ft to the L  Forward step up onto bosu with R LE and L UE assist 5x2  Lateral step up onto 3 inch step with R LE and bilateral UE assist 5x2.   SLS on R LE with ball toss 10 throws with rest breaks. Difficulty with endurance of femoral and pelvic control  Improved exercise technique, movement at target joints, use of target muscles after mod verbal, visual, tactile cues.     Improved ability to step up onto a step with R LE with bilateral UE assist without complain R hip pain.  Improved pelvic control and femoral control with aforementioned activity after mod verbal and  visual cues. Improving pelvic and femoral control overall. No increase in symptoms throughout session. Pt making progress towards goals. Continue working on strength, control and endurance of hip muscles.              PT Long Term Goals - 11/25/16 1923      PT LONG TERM GOAL #1   Title Pt will have a decrease in R lateral hip and SI joint pain to 4/10 or less at worst to promote ability negotiate stairs, stand up after prologed sitting, and tolerate lying on her R side when sleeping.   Baseline 8/10 R lateral hip and SI joint pain at worst (08/04/2016); 4/10 R lateral hip and SI/PSIS joint pain at worst (09/01/2016); 6/10 R PSIS and R lateral hip pain at worst for the past 7 days (09/17/2016);  4/10 R lateral hip and 3/10 R PSIS pain at most for the past 7 days (09/30/2016)   5-6/10 with stairs, 2/10 without stairs (10/22/2016); 4/10 at worst (11/25/2016)   Time 6   Period Weeks   Status Partially Met     PT LONG TERM GOAL #2   Title Patient will improve bilateral glute med and max strength by at least 1/2 MMT to promote ability to negotiate stairs with less pain.    Time 4   Period Weeks   Status Achieved     PT LONG TERM GOAL #3   Title Patient will improve her LEFS score by at least 9 points as a demonstration of improved function.    Baseline 39/80 (08/04/2016); 47/80 (09/01/2016); 56/80 (09/17/2016)   Time 4   Period Weeks   Status Achieved     PT LONG TERM GOAL #4   Title Patient will be able to ascend and descend 4 regular steps at least 3 times with bilateral rail assist with 2/10 or less R lateral hip and PSIS pain to promote ability to negotiate stairs at home.    Baseline At least 3/10 R PSIS and 4/10 R lateral hip pain when negotiating stairs at home (09/30/2016); 5-6/10 when trying to negotiate stairs at home 10/22/2016;  Pain with stairs (11/25/2016)   Time 6   Period Weeks   Status On-going               Plan - 12/23/16 1504    Clinical Impression Statement Improved  ability to step up onto a step with R LE with bilateral UE assist without complain R hip  pain.  Improved pelvic control and femoral control with aforementioned activity after mod verbal and visual cues. Improving pelvic and femoral control overall. No increase in symptoms throughout session. Pt making progress towards goals. Continue working on strength, control and endurance of hip muscles.    Clinical Presentation Stable   Clinical Presentation due to: improving symptoms, improving ability to step up onto a step with less pain   Clinical Decision Making Low   Rehab Potential Good   Clinical Impairments Affecting Rehab Potential Chronicity of condition, bilateral knee pain   PT Frequency 2x / week  1x every 2 weeks but can be 2x/week if needed   PT Duration 6 weeks   PT Treatment/Interventions Manual techniques;Therapeutic activities;Therapeutic exercise;Dry needling;Patient/family education;Neuromuscular re-education;Ultrasound;Iontophoresis 55m/ml Dexamethasone;Electrical Stimulation;Aquatic Therapy  Modalities if appropriate   PT Next Visit Plan hip strengthening, femoral control, lumbopelvic strengthening and control   Consulted and Agree with Plan of Care Patient      Patient will benefit from skilled therapeutic intervention in order to improve the following deficits and impairments:  Pain, Improper body mechanics, Decreased strength  Visit Diagnosis: Pain in right hip  Muscle weakness (generalized)     Problem List Patient Active Problem List   Diagnosis Date Noted  . Right hip pain 07/29/2016  . Osteoarthritis 04/02/2016  . Screening for cervical cancer 10/08/2015  . Pelvic pain in female 09/10/2015  . Midline thoracic back pain 08/15/2015  . Microscopic hematuria 05/08/2015  . Vertigo 10/27/2014  . Malignant [place] of [place] ([place] 11/19/2012  . Routine physical examination 10/27/2012  . Screening for colon cancer 10/27/2012  . Hyperlipidemia 02/17/2011  .  Osteopenia 02/17/2011    MJoneen BoersPT, DPT   12/23/2016, 3:25 PM  [facility] [address] [place] [address]Phone: [phone]  Fax:  [phone] Name: LSaadiya WilfongMRN: [id number] of Birth: [birth year]

## 2017-01-13 ENCOUNTER — Ambulatory Visit: Payer: Medicare Other | Attending: Family

## 2017-01-13 DIAGNOSIS — M25551 Pain in right hip: Secondary | ICD-10-CM | POA: Diagnosis not present

## 2017-01-13 DIAGNOSIS — M6281 Muscle weakness (generalized): Secondary | ICD-10-CM

## 2017-01-13 NOTE — Therapy (Signed)
[place] [facility] [address] [place], [address] Phone: [phone]   Fax:  [phone]  Physical Therapy Treatment  Patient Details  Name: Deborah Mcconnell MRN: [id number] Date of Birth: 10-Dec-1950 Referring Provider: Burnard Hawthorne, FNP  Encounter Date: 01/13/2017      PT End of Session - 01/13/17 1348    Visit Number 29   Number of Visits 51   Date for PT Re-Evaluation 02/26/17   Authorization Type 4   Authorization Time Period of 10 g code   PT Start Time 1601   PT Stop Time 1433   PT Time Calculation (min) 44 min   Activity Tolerance Patient tolerated treatment well   Behavior During Therapy [facility] for tasks assessed/performed      Past Medical History:  Diagnosis Date  . Arthritis    osteoarthritis  . Breast cancer, left ([place]) 2014   Hx Lumpectomy and Rad tx's.  Marland Kitchen Hyperlipidemia   . Osteopenia    previously taking Evista and Actonel  . Osteoporosis   . Vertigo     Past Surgical History:  Procedure Laterality Date  . CHOLECYSTECTOMY    . lt breast lumpectomy  2014    There were no vitals filed for this visit.      Subjective Assessment - 01/13/17 1347    Subjective R hip and PSIS is not gone, its still there. It has held since the last 3 weeks. About the same. No pain in sitting. Bothers her with steps.  Felt somewhat of a different pain last weak at her R PSIS. Took ibuprofen which helped.  Lasted about 6 days. Does not know what caused it.  Pt was standing and working at her [employer].  Pain was predominantly on the R side (like a band).  2/10 R lateral hip pain at worst (4/10 at most with 4-5 steps at work) for the past 7 days   Pertinent History R hip pain. Pt states she is currently taking prednisone which relevied a lot of her R hip pain. Last dose was yesterday.  R hip pain began around November 2017. Felt like her pain was going to go away at that time but it continued. Pain  is located from her R PSIS to R lateral hip and proximal lateral thigh. Feels like her pain started at her R PSIS. Difficulty lying on her R side (feels really sore).  Feels a burning sensation in her R sacroiliac area and a steady ache in her R hip (greater trochanter area).    Patient Stated Goals Go back to walking up stairs with less pain, be able to sleep better.    Currently in Pain? No/denies   Pain Score 0-No pain   Pain Onset More than a month ago                                 PT Education - 01/13/17 1418    Education provided Yes   Education Details ther-ex   [employer]) Educated Patient   Methods Explanation;Demonstration;Tactile cues;Verbal cues   Comprehension Returned demonstration;Verbalized understanding        Objectives  Hold off regular pallof press exercise (lateral). Can perform straight version.       There-ex   (-) Long sit test  Forward step up onto 1st regular step with bilateral UE assist and green band resisting R hip ER and  abduction 5x3, emphasis on femoral and pelvic control   No R lateral hip pain with femoral control. Reviewed and given as part of her HEP. Pt demonstrated and verbalized understanding. Pt states she does not need a handout paper.   L hip hike to work R glute med muscle 10 second holds 5 times for 2 sets, cues for femoral control when stepping up and down from the step.    Standing hip machine: R hip extension plate 100(good resistance felt) for 10x47for glute max strengthening  Standing R hip abduction at hip machine plate 40 for 41D, then 5x .   Ascending and descending 3 regular steps with bilateral UE assist, emphasis on femoral control 6x. 1-2/10 R lateral hip pain when performing.   Forward step down from 3 inch step with R LE with L UE assist    Slight R lateral hip ache afterwards. Pt was recommended to use ice and TENs at home. Pt verbalized understanding.     Improved exercise technique, movement at target joints, use of target muscles after min to mod verbal, visual, tactile cues.    Slight R lateral hip ache after session. Difficulty with femoral and pelvic control with stepping down more than stepping up a step but overall improving. Able to ascend and descend 3 regular steps with bilateral UE assist about 6 times today with 1-2/10 R lateral hip pain which is significantly better compared to pt reports a few months ago. Pt still demonstrates R lateral hip and PSIS pain, and difficulty with stair negotiation due to her symptoms and would benefit from continued skilled physical therapy services to address the aforementioned deficits.           PT Long Term Goals - 01/13/17 1448      PT LONG TERM GOAL #1   Title Pt will have a decrease in R lateral hip and SI joint pain to 4/10 or less at worst to promote ability negotiate stairs, stand up after prologed sitting, and tolerate lying on her R side when sleeping.   Baseline 8/10 R lateral hip and SI joint pain at worst (08/04/2016); 4/10 R lateral hip and SI/PSIS joint pain at worst (09/01/2016); 6/10 R PSIS and R lateral hip pain at worst for the past 7 days (09/17/2016);  4/10 R lateral hip and 3/10 R PSIS pain at most for the past 7 days (09/30/2016)   5-6/10 with stairs, 2/10 without stairs (10/22/2016); 4/10 at worst (11/25/2016,  01/13/2017)   Time 6   Period Weeks   Status Achieved     PT LONG TERM GOAL #2   Title Patient will improve bilateral glute med and max strength by at least 1/2 MMT to promote ability to negotiate stairs with less pain.    Time 4   Period Weeks   Status Achieved     PT LONG TERM GOAL #3   Title Patient will improve her LEFS score by at least 9 points as a demonstration of improved function.    Baseline 39/80 (08/04/2016); 47/80 (09/01/2016); 56/80 (09/17/2016)   Time 4   Period Weeks   Status Achieved     PT LONG TERM GOAL #4   Title Patient will be able to ascend and  descend 4 regular steps at least 3 times with bilateral rail assist with 2/10 or less R lateral hip and PSIS pain to promote ability to negotiate stairs at home.    Baseline At least 3/10 R PSIS and 4/10 R lateral hip  pain when negotiating stairs at home (09/30/2016); 5-6/10 when trying to negotiate stairs at home 10/22/2016;  Pain with stairs (11/25/2016); Able to perform at clinic with 2/10 R lateral hip pain but pt states 4/10 when performing at home (01/14/2016)   Time 6   Period Weeks   Status On-going     PT LONG TERM GOAL #5   Title Pt will have a decrease in R lateral hip and SI joint pain to 2/10 or less at worst to promote ability negotiate stairs, stand up after prologed sitting, and tolerate lying on her R side when sleeping.   Baseline 4/10 R lateral hip and SI joint pain at worst for the past 7 days (01/13/2017)   Time 6   Period Weeks   Status New               Plan - 01/13/17 1346    Clinical Impression Statement Slight R lateral hip ache after session. Difficulty with femoral and pelvic control with stepping down more than stepping up a step but overall improving. Able to ascend and descend 3 regular steps with bilateral UE assist about 6 times today with 1-2/10 R lateral hip pain which is significantly better compared to pt reports a few months ago. Pt still demonstrates R lateral hip and PSIS pain, and difficulty with stair negotiation due to her symptoms and would benefit from continued skilled physical therapy services to address the aforementioned deficits.     History and Personal Factors relevant to plan of care: Chronicity of condition   Clinical Presentation Stable   Clinical Presentation due to: Pain not worsening. Pt able to negotiate stairs today with 1-2/10 R lateral hip pain.     Clinical Decision Making Low   Rehab Potential Good   Clinical Impairments Affecting Rehab Potential Chronicity of condition, bilateral knee pain   PT Frequency 2x / week  1x every 2  weeks but can be 2x/week if needed   PT Duration 6 weeks   PT Treatment/Interventions Manual techniques;Therapeutic activities;Therapeutic exercise;Dry needling;Patient/family education;Neuromuscular re-education;Ultrasound;Iontophoresis 4mg /ml Dexamethasone;Electrical Stimulation;Aquatic Therapy  Modalities if appropriate   PT Next Visit Plan hip strengthening, femoral control, lumbopelvic strengthening and control   Consulted and Agree with Plan of Care Patient      Patient will benefit from skilled therapeutic intervention in order to improve the following deficits and impairments:  Pain, Improper body mechanics, Decreased strength  Visit Diagnosis: Pain in right hip - Plan: PT plan of care cert/re-cert  Muscle weakness (generalized) - Plan: PT plan of care cert/re-cert     Problem List Patient Active Problem List   Diagnosis Date Noted  . Right hip pain 07/29/2016  . Osteoarthritis 04/02/2016  . Screening for cervical cancer 10/08/2015  . Pelvic pain in female 09/10/2015  . Midline thoracic back pain 08/15/2015  . Microscopic hematuria 05/08/2015  . Vertigo 10/27/2014  . Malignant neoplasm of breast (female) ([place]) 11/19/2012  . Routine physical examination 10/27/2012  . Screening for colon cancer 10/27/2012  . Hyperlipidemia 02/17/2011  . Osteopenia 02/17/2011    Joneen Boers PT, DPT   01/13/2017, 2:59 PM  [facility] [facility] [facility] [address], [place], [address] Phone: [phone]   Fax:  [phone]  Name: Deborah Mcconnell MRN: [id number] Date of Birth: 01/12/1951

## 2017-01-14 ENCOUNTER — Ambulatory Visit (INDEPENDENT_AMBULATORY_CARE_PROVIDER_SITE_OTHER): Payer: Medicare Other

## 2017-01-14 ENCOUNTER — Telehealth: Payer: Self-pay | Admitting: Urology

## 2017-01-14 VITALS — BP 120/72 | HR 89 | Ht 71.5 in | Wt 182.7 lb

## 2017-01-14 DIAGNOSIS — N390 Urinary tract infection, site not specified: Secondary | ICD-10-CM

## 2017-01-14 LAB — MICROSCOPIC EXAMINATION: Epithelial Cells (non renal): NONE SEEN /hpf (ref 0–10)

## 2017-01-14 LAB — URINALYSIS, COMPLETE
BILIRUBIN UA: NEGATIVE
Glucose, UA: NEGATIVE
KETONES UA: NEGATIVE
NITRITE UA: NEGATIVE
Protein, UA: NEGATIVE
SPEC GRAV UA: 1.015 (ref 1.005–1.030)
UUROB: 0.2 mg/dL (ref 0.2–1.0)
pH, UA: 7 (ref 5.0–7.5)

## 2017-01-14 NOTE — Telephone Encounter (Signed)
Noted see nurse visit for today

## 2017-01-14 NOTE — Telephone Encounter (Signed)
Pt called office stating that she is having burning with painful and frequent urination with foul odor, denies fever/nausea. I added her to the nurse sched @ 3:00 today.  She wanted to drop off urine sample as she has a hx of UTI

## 2017-01-14 NOTE — Progress Notes (Signed)
Patient present today with c/o UTI sx(s) x2 days:   Urinary Frequency Burning/painful urination Urinary urgency Lower abdominal pain  Vitals:   01/14/17 1507  BP: 120/72  Pulse: 89  Weight: 182 lb 11.2 oz (82.9 kg)  Height: 5' 11.5" (1.816 m)     Pt has not been on antibiotics in the last 30 days. Pt is not on suppressive antibiotics. Pt has not had urological surgery in the last 30 days.   Pt provided urine sample, advised pt on u/a and ucx turn-around times, and informed will be contacted when results reviewed by provider. Pt verbalized understanding.

## 2017-01-16 LAB — URINE CULTURE

## 2017-01-19 MED ORDER — CEPHALEXIN 500 MG PO CAPS: 500.0000 mg | ORAL_CAPSULE | Freq: Two times a day (BID) | ORAL | 0 refills | Status: DC

## 2017-01-19 NOTE — Telephone Encounter (Signed)
-----   Message from Nori Riis, PA-C sent at 01/16/2017  4:44 PM EDT ----- Please let Mrs. Rozman know that her urine culture grew out 2 bacteria.  We need to have her come in for a cath specimen.  In the meantime, she can start Keflex 500 mg, bid x 7 days.

## 2017-01-19 NOTE — Telephone Encounter (Signed)
Spoke to patient. Gave results and med instructions. Pt says her sx(s) have improved w/o taking med, so she says she will take antibiotic, monitor for 3-4 days and come for cath specimen if needed.

## 2017-01-27 ENCOUNTER — Ambulatory Visit: Payer: Medicare Other

## 2017-02-03 ENCOUNTER — Encounter: Payer: Self-pay | Admitting: Family

## 2017-02-03 ENCOUNTER — Ambulatory Visit (INDEPENDENT_AMBULATORY_CARE_PROVIDER_SITE_OTHER): Payer: Medicare Other | Admitting: Family

## 2017-02-03 VITALS — BP 132/78 | HR 72 | Temp 98.0°F | Ht 71.5 in | Wt 182.2 lb

## 2017-02-03 DIAGNOSIS — R42 Dizziness and giddiness: Secondary | ICD-10-CM

## 2017-02-03 DIAGNOSIS — H9201 Otalgia, right ear: Secondary | ICD-10-CM | POA: Diagnosis not present

## 2017-02-03 DIAGNOSIS — R300 Dysuria: Secondary | ICD-10-CM

## 2017-02-03 LAB — URINALYSIS, ROUTINE W REFLEX MICROSCOPIC
Bilirubin Urine: NEGATIVE
Ketones, ur: NEGATIVE
Nitrite: NEGATIVE
Specific Gravity, Urine: <=1.005 — AB (ref 1.000–1.030)
Total Protein, Urine: NEGATIVE
Urine Glucose: NEGATIVE
Urobilinogen, UA: 0.2 (ref 0.0–1.0)
pH: 6 (ref 5.0–8.0)

## 2017-02-03 MED ORDER — MECLIZINE HCL 25 MG PO TABS: 25.0000 mg | ORAL_TABLET | Freq: Three times a day (TID) | ORAL | 0 refills | Status: DC | PRN

## 2017-02-03 MED ORDER — PREDNISONE 10 MG PO TABS: ORAL_TABLET | ORAL | 0 refills | Status: DC

## 2017-02-03 NOTE — Assessment & Plan Note (Addendum)
Chronic, recurrent UTIs. Last UTI was 2 weeks ago. She follows with urology. Her last urine culture showed 2 bacteria. Advised today that I would draw urine culture however if it showed more than 1 bacteria, I would advise her to see urology for cath for optimal treatment. Patient verbalized agreement

## 2017-02-03 NOTE — Progress Notes (Signed)
Pre visit review using our clinic review tool, if applicable. No additional management support is needed unless otherwise documented below in the visit note. 

## 2017-02-03 NOTE — Progress Notes (Signed)
Subjective:    Patient ID: Deborah Mcconnell, female    DOB: December 05, 1950, 65 y.o.   MRN: [id number]  CC: Deborah Mcconnell is a 66 y.o. female who presents today for an acute visit.    HPI: CC: right ear pain, chronic per patient, 'always feel like right ear is clogged.'  One week ago, had ear ache which has improved and wanted to be sure no infection and also avoid any vertigo spell.   Notices symptom more in the morning. Endorses excessive clear nasal congestion.   Worsening. Using fluticasone regularly however ran out over weekend, used nasal decongestant for past couple of days. Tried claritin every other day and cannot tell difference.   Still continues to have vertigo, last episode was 7 months ago. Episodes occur every 6- 8 months, for past 8 years. No syncope. "feels similar to vertigo episodes '.  Has a flight coming up for wedding and worries about vertigo.   Had seen [facility] and tested for meniere's disease, told nothing wrong with her ears. Notes tinnitus in bilateral ears, sensitive to loud noises. Told mild hearing loss in right ear- notes trouble hearing in large crowd.   Seen by neurologist and told she may be having 'silent migraines' with vertigo.   Complains today of dysuria and urgency for past 2 days, unchanged.  No hematuria.  Stays on low dose keflex for preventative after intercourse which was helping. Felt better for 3-4 days on kelfex and then came back in 2 days.         2010 MRI head- no acute abnormality Urology 09/2016; last treated with keflex BID. Treated again 12/2016  HISTORY:  Past Medical History:  Diagnosis Date  . Arthritis    osteoarthritis  . Breast cancer, left ([place]) 2014   Hx Lumpectomy and Rad tx's.  Marland Kitchen Hyperlipidemia   . Osteopenia    previously taking Evista and Actonel  . Osteoporosis   . Vertigo    Past Surgical History:  Procedure Laterality Date  . CHOLECYSTECTOMY    . lt breast lumpectomy  2014   Family History    Problem Relation Age of Onset  . Osteoporosis Mother   . Hypertension Mother   . Osteoarthritis Mother   . Osteoporosis Sister   . Osteoarthritis Sister   . Osteoarthritis Maternal Grandmother   . Diabetes Maternal Grandfather   . Cancer Paternal Grandfather        unknown  . Prostate cancer Neg Hx   . Bladder Cancer Neg Hx   . Kidney cancer Neg Hx     Allergies: Ciprofloxacin; Ofloxacin; Quinolones; Actonel [risedronate]; Augmentin [amoxicillin-pot clavulanate]; and Bactrim [sulfamethoxazole-trimethoprim] Current Outpatient Prescriptions on File Prior to Visit  Medication Sig Dispense Refill  . Ascorbic Acid (VITAMIN C) 1000 MG tablet Take 1,000 mg by mouth daily.    Marland Kitchen aspirin EC 81 MG tablet Take by mouth.    . B Complex Vitamins (VITAMIN B COMPLEX PO) Take by mouth.    . calcium carbonate (CALCIUM 600) 600 MG TABS tablet Take by mouth. Take 1,200 mg by mouth.    . cephALEXin (KEFLEX) 250 MG capsule Take 1 capsule (250 mg total) by mouth as needed. 90 capsule 0  . cephALEXin (KEFLEX) 500 MG capsule Take 1 capsule (500 mg total) by mouth 2 (two) times daily. 14 capsule 0  . cholecalciferol (VITAMIN D) 1000 UNITS tablet Take 1,000 Units by mouth daily.    Marland Kitchen CRANBERRY PO Take by mouth.    Marland Kitchen  diclofenac sodium (VOLTAREN) 1 % GEL Apply 4 g topically 4 (four) times daily. 1 Tube 3  . ibuprofen (ADVIL,MOTRIN) 200 MG tablet Take 600 mg by mouth 2 (two) times daily. prn    . letrozole (FEMARA) 2.5 MG tablet TAKE ONE TABLET BY MOUTH ONCE DAILY 30 tablet 0  . naproxen sodium (ANAPROX) 220 MG tablet Take by mouth.    . simvastatin (ZOCOR) 20 MG tablet Take 1 tablet (20 mg total) by mouth every evening. 90 tablet 3   No current facility-administered medications on file prior to visit.     Social History  Substance Use Topics  . Smoking status: Never Smoker  . Smokeless tobacco: Never Used  . Alcohol use 0.0 oz/week     Comment: rare    Review of Systems  Constitutional: Negative for  chills and fever.  HENT: Positive for ear pain and postnasal drip. Negative for ear discharge, hearing loss, sinus pain and sinus pressure.   Respiratory: Negative for cough, shortness of breath and wheezing.   Cardiovascular: Negative for chest pain and palpitations.  Gastrointestinal: Negative for nausea and vomiting.  Genitourinary: Positive for dysuria and frequency. Negative for hematuria.  Neurological: Negative for dizziness and headaches.      Objective:    BP 132/78   Pulse 72   Temp 98 F (36.7 C) (Oral)   Ht 5' 11.5" (1.816 m)   Wt 182 lb 3.2 oz (82.6 kg)   SpO2 99%   BMI 25.06 kg/m    Physical Exam  Constitutional: She appears well-developed and well-nourished.  HENT:  Head: Normocephalic and atraumatic.  Right Ear: Hearing, tympanic membrane, external ear and ear canal normal. No drainage, swelling or tenderness. No foreign bodies. Tympanic membrane is not erythematous and not bulging. No middle ear effusion. No decreased hearing is noted.  Left Ear: Hearing, tympanic membrane, external ear and ear canal normal. No drainage, swelling or tenderness. No foreign bodies. Tympanic membrane is not erythematous and not bulging.  No middle ear effusion. No decreased hearing is noted.  Nose: Nose normal. No rhinorrhea. Right sinus exhibits no maxillary sinus tenderness and no frontal sinus tenderness. Left sinus exhibits no maxillary sinus tenderness and no frontal sinus tenderness.  Mouth/Throat: Uvula is midline, oropharynx is clear and moist and mucous membranes are normal. No oropharyngeal exudate, posterior oropharyngeal edema, posterior oropharyngeal erythema or tonsillar abscesses.  Eyes: Conjunctivae are normal.  Cardiovascular: Normal rate, regular rhythm, normal heart sounds and normal pulses.   Pulmonary/Chest: Effort normal and breath sounds normal. She has no wheezes. She has no rhonchi. She has no rales.  Abdominal: There is no CVA tenderness.  Lymphadenopathy:        Head (right side): No submental, no submandibular, no tonsillar, no preauricular, no posterior auricular and no occipital adenopathy present.       Head (left side): No submental, no submandibular, no tonsillar, no preauricular, no posterior auricular and no occipital adenopathy present.    She has no cervical adenopathy.  Neurological: She is alert.  Skin: Skin is warm and dry.  Psychiatric: She has a normal mood and affect. Her speech is normal and behavior is normal. Thought content normal.  Vitals reviewed.      Assessment & Plan:   Problem List Items Addressed This Visit      Other   Vertigo - Primary    Chronic. Reassured by normal neurologic exam. No recent episodes. Refilled meclizine as prior rx had expired and would like patient to  have on hand. Last MRI 2010. Patient declines any new imaging today as vertigo frequency/presentation are the same which I think is appropriate. Patient will stay hypervigilant and let me know if vertigo symptoms were to change; at that time, we would pursue further evaluation      Relevant Medications   meclizine (ANTIVERT) 25 MG tablet   predniSONE (DELTASONE) 10 MG tablet   Dysuria    Chronic, recurrent UTIs. Last UTI was 2 weeks ago. She follows with urology. Her last urine culture showed 2 bacteria. Advised today that I would draw urine culture however if it showed more than 1 bacteria, I would advise her to see urology for cath for optimal treatment. Patient verbalized agreement      Relevant Orders   Urinalysis, Routine w reflex microscopic   CULTURE, URINE COMPREHENSIVE   Right ear pain    Acute on chronic. Working diagnosis of eustachian tube dysfunction. Advised patient to take Claritin daily, short trial of prednisone. Patient and let me know if she is not better.            I am having Ms. Petit start on predniSONE. I am also having her maintain her cholecalciferol, ibuprofen, calcium carbonate, letrozole, naproxen  sodium, diclofenac sodium, simvastatin, vitamin C, aspirin EC, CRANBERRY PO, B Complex Vitamins (VITAMIN B COMPLEX PO), cephALEXin, cephALEXin, and meclizine.   Meds ordered this encounter  Medications  . meclizine (ANTIVERT) 25 MG tablet    Sig: Take 1 tablet (25 mg total) by mouth 3 (three) times daily as needed.    Dispense:  90 tablet    Refill:  0    Order Specific Question:   Supervising Provider    Answer:   Deborra Medina L [[id number]]  . predniSONE (DELTASONE) 10 MG tablet    Sig: Take 40 mg by mouth on day 1, then taper 10 mg daily until gone    Dispense:  10 tablet    Refill:  0    Order Specific Question:   Supervising Provider    Answer:   Crecencio Mc [[id number]]    Return precautions given.   Risks, benefits, and alternatives of the medications and treatment plan prescribed today were discussed, and patient expressed understanding.   Education regarding symptom management and diagnosis given to patient on AVS.  Continue to follow with Burnard Hawthorne, FNP for routine health maintenance.   Deborah Mcconnell and I agreed with plan.   Mable Paris, FNP

## 2017-02-03 NOTE — Assessment & Plan Note (Signed)
Acute on chronic. Working diagnosis of eustachian tube dysfunction. Advised patient to take Claritin daily, short trial of prednisone. Patient and let me know if she is not better.

## 2017-02-03 NOTE — Patient Instructions (Signed)
Trial of prednisone, claritin daily  Will call with urine culture

## 2017-02-03 NOTE — Assessment & Plan Note (Addendum)
Chronic. Reassured by normal neurologic exam. No recent episodes. Refilled meclizine as prior rx had expired and would like patient to have on hand. Last MRI 2010. Patient declines any new imaging today as vertigo frequency/presentation are the same which I think is appropriate. Patient will stay hypervigilant and let me know if vertigo symptoms were to change; at that time, we would pursue further evaluation

## 2017-02-05 LAB — CULTURE, URINE COMPREHENSIVE

## 2017-02-10 ENCOUNTER — Ambulatory Visit: Payer: Medicare Other | Attending: Family

## 2017-02-10 DIAGNOSIS — M25551 Pain in right hip: Secondary | ICD-10-CM | POA: Insufficient documentation

## 2017-02-10 DIAGNOSIS — M6281 Muscle weakness (generalized): Secondary | ICD-10-CM | POA: Diagnosis not present

## 2017-02-10 NOTE — Therapy (Signed)
[facility] [address], [place], [address] Phone: [phone]   Fax:  [phone]  Physical Therapy Treatment And Discharge Summary  Patient Details  Name: Deborah Mcconnell MRN: [id number] Date of Birth: 12-Feb-1951 Referring Provider: Burnard Hawthorne, FNP  Encounter Date: 02/10/2017      PT End of Session - 02/10/17 1350    Visit Number 30   Number of Visits 51   Date for PT Re-Evaluation 02/26/17   Authorization Type 5   Authorization Time Period of 10 g code   PT Start Time 1350   PT Stop Time 1414   PT Time Calculation (min) 24 min   Activity Tolerance Patient tolerated treatment well   Behavior During Therapy [facility] for tasks assessed/performed      Past Medical History:  Diagnosis Date  . Arthritis    osteoarthritis  . Breast cancer, left ([place]) 2014   Hx Lumpectomy and Rad tx's.  Marland Kitchen Hyperlipidemia   . Osteopenia    previously taking Evista and Actonel  . Osteoporosis   . Vertigo     Past Surgical History:  Procedure Laterality Date  . CHOLECYSTECTOMY    . lt breast lumpectomy  2014    There were no vitals filed for this visit.      Subjective Assessment - 02/10/17 1352    Subjective R low back is essentially pain-free. Has not had pain in that spot for the past couple of weeks. Day to day activities does not give her R hip pain (0-1/10). The only time she feels it is when she rolls onto her R side, and the steps at the end of the day.  Feels weakness in her knees and states that she has bad osteoarthritis.  1-2/10 R lateral hip pain at most for the past 7 days, 3/10 when performing stairs.  0-1/10 R low back pain at most for the past 7 days.  Feels good about graduating today and continuing with her HEP at home.    Pertinent History R hip pain. Pt states she is currently taking prednisone which relevied a lot of her R hip pain. Last dose was yesterday.  R hip pain began around  November 2017. Felt like her pain was going to go away at that time but it continued. Pain is located from her R PSIS to R lateral hip and proximal lateral thigh. Feels like her pain started at her R PSIS. Difficulty lying on her R side (feels really sore).  Feels a burning sensation in her R sacroiliac area and a steady ache in her R hip (greater trochanter area).    Patient Stated Goals Go back to walking up stairs with less pain, be able to sleep better.    Currently in Pain? No/denies   Pain Score 0-No pain   Pain Onset More than a month ago                                 PT Education - 02/10/17 1404    Education provided Yes   Education Details ther-ex, HEP, plan of care   Person(s) Educated Patient   Methods Explanation;Demonstration;Tactile cues;Verbal cues   Comprehension Returned demonstration;Verbalized understanding        Objectives   There-ex   Standing leg press double blue band 10x with R LE, bilateral UE assist  Standing L hip hikes 10x10 second holds  to improve R glute med muscle strength.   Ascending and descending 4 regular steps 3x with bilateral UE assist   Min cues for femoral control. No pain in clinic.    Reviewed HEP. Pt demonstrated and verbalized understanding.   Reviewed plan of care: continue progress with HEP.   Reviewed progress towards goals.   Improved exercise technique, movement at target joints, use of target muscles after min verbal, visual, tactile cues.      Pt has progressed very well with physical therapy towards goals with R PSIS pain level decreasing to 0-1/10 at worst and R lateral hip pain decreasing to 3/10 at worst. Pt is also better able to negotiate stairs, and perform functional tasks with minimal to no pain. Pt demonstrates independence with her HEP and continued to make progress during the follow up visits. Skilled physical therapy services discharged due to very good progress and HEP  independence with pt continuing her improvement with her exercises at home.          PT Long Term Goals - 02/10/17 1357      PT LONG TERM GOAL #1   Title Pt will have a decrease in R lateral hip and SI joint pain to 4/10 or less at worst to promote ability negotiate stairs, stand up after prologed sitting, and tolerate lying on her R side when sleeping.   Baseline 8/10 R lateral hip and SI joint pain at worst (08/04/2016); 4/10 R lateral hip and SI/PSIS joint pain at worst (09/01/2016); 6/10 R PSIS and R lateral hip pain at worst for the past 7 days (09/17/2016);  4/10 R lateral hip and 3/10 R PSIS pain at most for the past 7 days (09/30/2016)   5-6/10 with stairs, 2/10 without stairs (10/22/2016); 4/10 at worst (11/25/2016,  01/13/2017)   Time 6   Period Weeks   Status Achieved     PT LONG TERM GOAL #2   Title Patient will improve bilateral glute med and max strength by at least 1/2 MMT to promote ability to negotiate stairs with less pain.    Time 4   Period Weeks   Status Achieved     PT LONG TERM GOAL #3   Title Patient will improve her LEFS score by at least 9 points as a demonstration of improved function.    Baseline 39/80 (08/04/2016); 47/80 (09/01/2016); 56/80 (09/17/2016)   Time 4   Period Weeks   Status Achieved     PT LONG TERM GOAL #4   Title Patient will be able to ascend and descend 4 regular steps at least 3 times with bilateral rail assist with 2/10 or less R lateral hip and PSIS pain to promote ability to negotiate stairs at home.    Baseline At least 3/10 R PSIS and 4/10 R lateral hip pain when negotiating stairs at home (09/30/2016); 5-6/10 when trying to negotiate stairs at home 10/22/2016;  Pain with stairs (11/25/2016); Able to perform at clinic with 2/10 R lateral hip pain but pt states 4/10 when performing at home (01/14/2016); 0/10 pain when performed in clinic (02/10/2017)   Time 6   Period Weeks   Status Achieved     PT LONG TERM GOAL #5   Title Pt will have a decrease in  R lateral hip and SI joint pain to 2/10 or less at worst to promote ability negotiate stairs, stand up after prologed sitting, and tolerate lying on her R side when sleeping.   Baseline 4/10 R lateral  hip and SI joint pain at worst for the past 7 days (01/13/2017); 0-1/10 R PSIS, 3/10 R lateral hip with stairs. 22-Feb-2017   Time 6   Period Weeks   Status Partially Met               Plan - February 22, 2017 1424    Clinical Impression Statement Pt has progressed very well with physical therapy towards goals with R PSIS pain level decreasing to 0-1/10 at worst and R lateral hip pain decreasing to 3/10 at worst. Pt is also better able to negotiate stairs, and perform functional tasks with minimal to no pain. Pt demonstrates independence with her HEP and continued to make progress during the follow up visits. Skilled physical therapy services discharged due to very good progress and HEP independence with pt continuing her improvement with her exercises at home.    History and Personal Factors relevant to plan of care: Chronicity of condition   Clinical Presentation Stable   Clinical Presentation due to: Pt pain level continues to decrease   Clinical Decision Making Low   Rehab Potential Good   Clinical Impairments Affecting Rehab Potential Chronicity of condition, bilateral knee pain   PT Frequency --  1x every 2 weeks but can be 2x/week if needed   PT Treatment/Interventions Manual techniques;Therapeutic activities;Therapeutic exercise;Patient/family education;Neuromuscular re-education;Electrical Stimulation  Modalities if appropriate   PT Next Visit Plan Continue progress with her HEP   Consulted and Agree with Plan of Care Patient      Patient will benefit from skilled therapeutic intervention in order to improve the following deficits and impairments:  Pain, Improper body mechanics, Decreased strength  Visit Diagnosis: Pain in right hip  Muscle weakness (generalized)       G-Codes -  02-22-17 1426    Functional Assessment Tool Used (Outpatient Only)  clinical presentation, patient interview   Functional Limitation Mobility: Walking and moving around   Mobility: Walking and Moving Around Goal Status [id number]) At least 1 percent but less than 20 percent impaired, limited or restricted   Mobility: Walking and Moving Around Discharge Status [phone]) At least 1 percent but less than 20 percent impaired, limited or restricted      Problem List Patient Active Problem List   Diagnosis Date Noted  . Dysuria 02/03/2017  . Right ear pain 02/03/2017  . Right hip pain 07/29/2016  . Osteoarthritis 04/02/2016  . Screening for cervical cancer 10/08/2015  . Pelvic pain in female 09/10/2015  . Midline thoracic back pain 08/15/2015  . Microscopic hematuria 05/08/2015  . Vertigo 10/27/2014  . Malignant neoplasm of breast (female) ([place]) 11/19/2012  . Routine physical examination 10/27/2012  . Screening for colon cancer 10/27/2012  . Hyperlipidemia 02/17/2011  . Osteopenia 02/17/2011   Thank you for your referral.  Joneen Boers PT, DPT   02-22-17, 2:33 PM  [facility] [address] [place], [address] Phone: [phone]   Fax:  [phone]  Name: Deborah Mcconnell MRN: [id number] Date of Birth: 15-Nov-1950

## 2017-02-24 ENCOUNTER — Ambulatory Visit (INDEPENDENT_AMBULATORY_CARE_PROVIDER_SITE_OTHER): Payer: Medicare Other

## 2017-02-24 VITALS — BP 119/73 | HR 88 | Ht 71.0 in | Wt 181.2 lb

## 2017-02-24 DIAGNOSIS — N390 Urinary tract infection, site not specified: Secondary | ICD-10-CM | POA: Diagnosis not present

## 2017-02-24 LAB — URINALYSIS, COMPLETE
BILIRUBIN UA: NEGATIVE
GLUCOSE, UA: NEGATIVE
KETONES UA: NEGATIVE
Nitrite, UA: NEGATIVE
PROTEIN UA: NEGATIVE
UUROB: 0.2 mg/dL (ref 0.2–1.0)
pH, UA: 5 (ref 5.0–7.5)

## 2017-02-24 LAB — MICROSCOPIC EXAMINATION

## 2017-02-24 NOTE — Progress Notes (Signed)
Pt presents today with c/o urinary frequency and urgency, dysuria, and lower abd pain. A clean catch was obtained for u/a and cx.  Blood pressure 119/73, pulse 88, height 5\' 11"  (1.803 m), weight 181 lb 3.2 oz (82.2 kg).

## 2017-02-27 ENCOUNTER — Other Ambulatory Visit: Payer: Self-pay | Admitting: Urology

## 2017-02-27 LAB — CULTURE, URINE COMPREHENSIVE

## 2017-02-27 MED ORDER — NITROFURANTOIN MONOHYD MACRO 100 MG PO CAPS: 100.0000 mg | ORAL_CAPSULE | Freq: Two times a day (BID) | ORAL | 0 refills | Status: DC

## 2017-03-03 ENCOUNTER — Telehealth: Payer: Self-pay

## 2017-03-03 NOTE — Telephone Encounter (Signed)
Spoke with pt in reference to macrobid. Pt stated medication was picked up last week.

## 2017-03-03 NOTE — Telephone Encounter (Signed)
-----   Message from Nori Riis, PA-C sent at 02/27/2017  4:35 PM EDT ----- Please let Mrs. Blas know that urine culture was positive.  She needs to start Macrobid 100 mg bid for seven days.

## 2017-03-04 DIAGNOSIS — C50912 Malignant neoplasm of unspecified site of left female breast: Secondary | ICD-10-CM | POA: Diagnosis not present

## 2017-03-04 DIAGNOSIS — Z170 Estrogen receptor positive status [ER+]: Secondary | ICD-10-CM | POA: Diagnosis not present

## 2017-03-04 DIAGNOSIS — C50312 Malignant neoplasm of lower-inner quadrant of left female breast: Secondary | ICD-10-CM | POA: Diagnosis not present

## 2017-03-11 DIAGNOSIS — M8589 Other specified disorders of bone density and structure, multiple sites: Secondary | ICD-10-CM | POA: Diagnosis not present

## 2017-03-11 DIAGNOSIS — Z79811 Long term (current) use of aromatase inhibitors: Secondary | ICD-10-CM | POA: Diagnosis not present

## 2017-04-17 ENCOUNTER — Ambulatory Visit (INDEPENDENT_AMBULATORY_CARE_PROVIDER_SITE_OTHER): Payer: Medicare Other | Admitting: *Deleted

## 2017-04-17 DIAGNOSIS — Z23 Encounter for immunization: Secondary | ICD-10-CM | POA: Diagnosis not present

## 2017-05-15 ENCOUNTER — Other Ambulatory Visit: Payer: Self-pay | Admitting: Family

## 2017-05-15 DIAGNOSIS — E782 Mixed hyperlipidemia: Secondary | ICD-10-CM

## 2017-06-01 DIAGNOSIS — L578 Other skin changes due to chronic exposure to nonionizing radiation: Secondary | ICD-10-CM | POA: Diagnosis not present

## 2017-06-01 DIAGNOSIS — L821 Other seborrheic keratosis: Secondary | ICD-10-CM | POA: Diagnosis not present

## 2017-06-01 DIAGNOSIS — L812 Freckles: Secondary | ICD-10-CM | POA: Diagnosis not present

## 2017-06-01 DIAGNOSIS — I781 Nevus, non-neoplastic: Secondary | ICD-10-CM | POA: Diagnosis not present

## 2017-06-01 DIAGNOSIS — L570 Actinic keratosis: Secondary | ICD-10-CM | POA: Diagnosis not present

## 2017-06-01 DIAGNOSIS — I8393 Asymptomatic varicose veins of bilateral lower extremities: Secondary | ICD-10-CM | POA: Diagnosis not present

## 2017-06-01 DIAGNOSIS — D229 Melanocytic nevi, unspecified: Secondary | ICD-10-CM | POA: Diagnosis not present

## 2017-06-01 DIAGNOSIS — D1800 Hemangioma unspecified site: Secondary | ICD-10-CM | POA: Diagnosis not present

## 2017-06-01 DIAGNOSIS — Z1283 Encounter for screening for malignant neoplasm of skin: Secondary | ICD-10-CM | POA: Diagnosis not present

## 2017-08-18 ENCOUNTER — Ambulatory Visit (INDEPENDENT_AMBULATORY_CARE_PROVIDER_SITE_OTHER): Payer: Medicare Other

## 2017-08-18 VITALS — BP 116/68 | HR 80 | Temp 98.2°F | Resp 14 | Ht 71.5 in | Wt 190.8 lb

## 2017-08-18 DIAGNOSIS — Z0000 Encounter for general adult medical examination without abnormal findings: Secondary | ICD-10-CM | POA: Diagnosis not present

## 2017-08-18 DIAGNOSIS — Z23 Encounter for immunization: Secondary | ICD-10-CM | POA: Diagnosis not present

## 2017-08-18 NOTE — Progress Notes (Addendum)
Subjective:   Deborah Mcconnell is a 67 y.o. female who presents for Medicare Annual (Subsequent) preventive examination.  Review of Systems:  No ROS.  Medicare Wellness Visit. Additional risk factors are reflected in the social history. Cardiac Risk Factors include: advanced age (>4men, >67 women)     Objective:     Vitals: BP 116/68 (BP Location: Right Arm, Patient Position: Sitting, Cuff Size: Normal)   Pulse 80   Temp 98.2 F (36.8 C) (Oral)   Resp 14   Ht 5' 11.5" (1.816 m)   Wt 190 lb 12.8 oz (86.5 kg)   SpO2 98%   BMI 26.24 kg/m   Body mass index is 26.24 kg/m.  Advanced Directives 08/18/2017 08/15/2016 08/04/2016 08/28/2015 12/21/2014  Does Patient Have a Medical Advance Directive? Yes Yes Yes Yes Yes  Type of Paramedic of [place];Living will [place];Living will [place];Living will Living will;Healthcare Power of [place];Living will;Mental Health Advance Directive;Advance instruction for mental health treatment  Does patient want to make changes to medical advance directive? No - Patient declined No - Patient declined No - Patient declined No - Patient declined No - Patient declined  Copy of [place] in Chart? No - copy requested No - copy requested - - Yes    Tobacco Social History   Tobacco Use  Smoking Status Never Smoker  Smokeless Tobacco Never Used     Counseling given: Not Answered   Clinical Intake:  Pre-visit preparation completed: Yes  Pain : No/denies pain     Nutritional Status: BMI 25 -29 Overweight Diabetes: No  How often do you need to have someone help you when you read instructions, pamphlets, or other written materials from your doctor or pharmacy?: 1 - Never  Interpreter Needed?: No     Past Medical History:  Diagnosis Date  . Arthritis    osteoarthritis  . Breast cancer, left ([place]) 2014   Hx Lumpectomy and Rad  tx's.  Marland Kitchen Hyperlipidemia   . Osteopenia    previously taking Evista and Actonel  . Osteoporosis   . Vertigo    Past Surgical History:  Procedure Laterality Date  . CHOLECYSTECTOMY    . lt breast lumpectomy  2014   Family History  Problem Relation Age of Onset  . Osteoporosis Mother   . Hypertension Mother   . Osteoarthritis Mother   . Osteoporosis Sister   . Osteoarthritis Sister   . Osteoarthritis Maternal Grandmother   . Diabetes Maternal Grandfather   . Cancer Paternal Grandfather        unknown  . Prostate cancer Neg Hx   . Bladder Cancer Neg Hx   . Kidney cancer Neg Hx    Social History   Socioeconomic History  . Marital status: Married    Spouse name: None  . Number of children: None  . Years of education: None  . Highest education level: None  Social Needs  . Financial resource strain: None  . Food insecurity - worry: None  . Food insecurity - inability: None  . Transportation needs - medical: None  . Transportation needs - non-medical: None  Occupational History  . None  Tobacco Use  . Smoking status: Never Smoker  . Smokeless tobacco: Never Used  Substance and Sexual Activity  . Alcohol use: Yes    Alcohol/week: 0.0 oz    Comment: rare  . Drug use: No  . Sexual activity: Yes  Other Topics Concern  . None  Social History Narrative   'lindy'      Works at [employer] in [place].       Married.       Exercise- not excercising.           Outpatient Encounter Medications as of 08/18/2017  Medication Sig  . Ascorbic Acid (VITAMIN C) 1000 MG tablet Take 1,000 mg by mouth daily.  Marland Kitchen aspirin EC 81 MG tablet Take by mouth.  . B Complex Vitamins (VITAMIN B COMPLEX PO) Take by mouth.  . calcium carbonate (CALCIUM 600) 600 MG TABS tablet Take by mouth. Take 1,200 mg by mouth.  . cephALEXin (KEFLEX) 250 MG capsule Take 1 capsule (250 mg total) by mouth as needed.  . cholecalciferol (VITAMIN D) 1000 UNITS tablet Take 1,000 Units by mouth  daily.  Marland Kitchen CRANBERRY PO Take by mouth.  . diclofenac sodium (VOLTAREN) 1 % GEL Apply 4 g topically 4 (four) times daily.  Marland Kitchen ibuprofen (ADVIL,MOTRIN) 200 MG tablet Take 600 mg by mouth 2 (two) times daily. prn  . letrozole (FEMARA) 2.5 MG tablet TAKE ONE TABLET BY MOUTH ONCE DAILY  . meclizine (ANTIVERT) 25 MG tablet Take 1 tablet (25 mg total) by mouth 3 (three) times daily as needed.  . naproxen sodium (ANAPROX) 220 MG tablet Take by mouth.  . simvastatin (ZOCOR) 20 MG tablet TAKE ONE TABLET BY MOUTH IN THE EVENING  . [DISCONTINUED] cephALEXin (KEFLEX) 500 MG capsule Take 1 capsule (500 mg total) by mouth 2 (two) times daily.  . [DISCONTINUED] nitrofurantoin, macrocrystal-monohydrate, (MACROBID) 100 MG capsule Take 1 capsule (100 mg total) by mouth every 12 (twelve) hours.  . [DISCONTINUED] predniSONE (DELTASONE) 10 MG tablet Take 40 mg by mouth on day 1, then taper 10 mg daily until gone   No facility-administered encounter medications on file as of 08/18/2017.     Activities of Daily Living In your present state of health, do you have any difficulty performing the following activities: 08/18/2017  Hearing? N  Vision? N  Difficulty concentrating or making decisions? N  Walking or climbing stairs? N  Dressing or bathing? N  Doing errands, shopping? N  Preparing Food and eating ? N  Using the Toilet? N  In the past six months, have you accidently leaked urine? N  Comment Followed by Urologist  Do you have problems with loss of bowel control? N  Managing your Medications? N  Managing your Finances? N  Housekeeping or managing your Housekeeping? N  Some recent data might be hidden    Patient Care Team: Burnard Hawthorne, FNP as PCP - General (Family Medicine)    Assessment:   This is a routine wellness examination for [name].  The goal of the wellness visit is to assist the patient how to close the gaps in care and create a preventative care plan for the patient.   The roster of  all physicians providing medical care to patient is listed in the Snapshot section of the chart.  Taking calcium VIT D as appropriate/Osteoporosis reviewed.    Safety issues reviewed; Smoke and carbon monoxide detectors in the home. No firearms or firearms locked in a safe within the home. Wears seatbelts when driving or riding with others. No violence in the home.  They do not have excessive sun exposure.  Discussed the need for sun protection: hats, long sleeves and the use of sunscreen if there is significant sun exposure.  Patient is alert, normal appearance,  oriented to person/place/and time.  Correctly identified the president of the [place] and recalls of 3/3 words. Performs simple calculations and can read correct time from watch face.  Displays appropriate judgement.  No new identified risk were noted.  No failures at ADL's or IADL's.   BMI- discussed the importance of a healthy diet, water intake and the benefits of aerobic exercise. Educational material provided.   24 hour diet recall: Regular diet  Dental- every 6 months.  Eye- Visual acuity not assessed per patient preference since they have regular follow up with the ophthalmologist.  Wears corrective lenses.  Sleep patterns- Sleeps 6 hours at night.  Wakes feeling rested.    Pneumovax 23 vaccine administered, tolerated well. Educational material provided.  Health maintenance gaps- closed.  Patient Concerns: None at this time. Follow up with PCP as needed.  Exercise Activities and Dietary recommendations Current Exercise Habits: The patient does not participate in regular exercise at present  Goals    . Increase physical activity     Weight bearing exercises       Fall Risk Fall Risk  08/18/2017 02/03/2017 08/15/2016 07/29/2016 04/02/2016  Falls in the past year? No No No No No   Depression Screen PHQ 2/9 Scores 08/18/2017 02/03/2017 08/15/2016 07/29/2016  PHQ - 2 Score 0 0 0 0     Cognitive Function MMSE -  Mini Mental State Exam 08/18/2017 08/15/2016  Orientation to time 5 5  Orientation to Place 5 5  Registration 3 3  Attention/ Calculation 5 5  Recall 3 3  Language- name 2 objects 2 2  Language- repeat 1 1  Language- follow 3 step command 3 3  Language- read & follow direction 1 1  Write a sentence 1 1  Copy design 1 1  Total score 30 30     6CIT Screen 08/15/2016  What Year? 0 points  What month? 0 points  What time? 0 points  Count back from 20 0 points  Months in reverse 0 points  Repeat phrase 0 points  Total Score 0    Immunization History  Administered Date(s) Administered  . Influenza Whole 04/13/2013  . Influenza, High Dose Seasonal PF 02/27/2016, 04/17/2017  . Influenza,inj,Quad PF,6+ Mos 06/13/2015  . Influenza-Unspecified 04/03/2014  . Pneumococcal Conjugate-13 09/10/2015  . Pneumococcal Polysaccharide-23 04/28/2012, 08/18/2017  . Tdap 10/05/2014  . Zoster 05/08/2014    Screening Tests Health Maintenance  Topic Date Due  . MAMMOGRAM  03/05/2019  . COLONOSCOPY  07/24/2024  . TETANUS/TDAP  10/04/2024  . INFLUENZA VACCINE  Completed  . DEXA SCAN  Completed  . Hepatitis C Screening  Completed  . PNA vac Low Risk Adult  Completed       Plan:    End of life planning; Advance aging; Advanced directives discussed. Copy of current HCPOA/Living Will requested.    I have personally reviewed and noted the following in the patient's chart:   . Medical and social history . Use of alcohol, tobacco or illicit drugs  . Current medications and supplements . Functional ability and status . Nutritional status . Physical activity . Advanced directives . List of other physicians . Hospitalizations, surgeries, and ER visits in previous 12 months . Vitals . Screenings to include cognitive, depression, and falls . Referrals and appointments  In addition, I have reviewed and discussed with patient certain preventive protocols, quality metrics, and best practice  recommendations. A written personalized care plan for preventive services as well as general preventive health recommendations were  provided to patient.     Varney Biles, LPN  [id number]/61/9509    Agree with plan. Mable Paris, NP

## 2017-08-18 NOTE — Patient Instructions (Addendum)
  Ms. Cutbirth , Thank you for taking time to come for your Medicare Wellness Visit. I appreciate your ongoing commitment to your health goals. Please review the following plan we discussed and let me know if I can assist you in the future.   Follow up with Mable Paris as needed.    Bring a copy of your [place] and/or Living Will to be scanned into chart.  Have a great day!  These are the goals we discussed: Goals    . Increase physical activity     Weight bearing exercises       This is a list of the screening recommended for you and due dates:  Health Maintenance  Topic Date Due  . Mammogram  03/05/2019  . Colon Cancer Screening  07/24/2024  . Tetanus Vaccine  10/04/2024  . Flu Shot  Completed  . DEXA scan (bone density measurement)  Completed  .  Hepatitis C: One time screening is recommended by Center for Disease Control  (CDC) for  adults born from [birth year] through 1965.   Completed  . Pneumonia vaccines  Completed

## 2017-08-27 ENCOUNTER — Other Ambulatory Visit: Payer: Self-pay | Admitting: Internal Medicine

## 2017-08-27 DIAGNOSIS — E782 Mixed hyperlipidemia: Secondary | ICD-10-CM

## 2017-09-11 ENCOUNTER — Encounter: Payer: Self-pay | Admitting: Family

## 2017-09-11 ENCOUNTER — Ambulatory Visit (INDEPENDENT_AMBULATORY_CARE_PROVIDER_SITE_OTHER): Payer: Medicare Other | Admitting: Family

## 2017-09-11 VITALS — BP 138/78 | HR 79 | Temp 98.5°F | Resp 20 | Ht 71.5 in | Wt 185.1 lb

## 2017-09-11 DIAGNOSIS — R002 Palpitations: Secondary | ICD-10-CM | POA: Diagnosis not present

## 2017-09-11 DIAGNOSIS — J40 Bronchitis, not specified as acute or chronic: Secondary | ICD-10-CM | POA: Diagnosis not present

## 2017-09-11 LAB — POCT RAPID STREP A (OFFICE): RAPID STREP A SCREEN: NEGATIVE

## 2017-09-11 NOTE — Patient Instructions (Addendum)
Fasting labs when you can  Plain mucinex Continue allegra.  Plenty of water  Let me know if not better  Please see very close attention to palpitations and let me know if recurs.  Such a pleasure seeing you today

## 2017-09-11 NOTE — Assessment & Plan Note (Addendum)
Resolved at this time.  Reassured is no associated chest pain, shortness of breath.  Patient has regular exercise.  Discussed with ppatient possible triggers include anxiety- of note,  palpitations stopped after patient stress at work improved.  Pending lab panel today.  EKG compared to 2016.  No significant changes noted today.  We discussed the absence of her palpitations we will save hypervigilant at this time.  We discussed her radiation, particularly left side, due to h/o breast cancer. We jointly decided that we would not purse cardiology referral at this time however would consider in near future or certainly if palpitations recur- patient will let me know.

## 2017-09-11 NOTE — Progress Notes (Signed)
Subjective:    Patient ID: Deborah Mcconnell, female    DOB: 08/10/1950, 67 y.o.   MRN: [id number]  CC: Deborah Mcconnell is a 67 y.o. female who presents today for follow up.   HPI: Past 3-4 days, had sore throat, however 'felt great'. Thought laryngitis. Last night, productive cough, nasal congestion, ears hurting, fever 100 tmax last night. No myalgias, wheezing, sob.   No vertigo since 1 year ago.   Made follow up during medicare wellness as she noted that her palpiations over last . For a while, had them weekly. Has had then for years,hasn't had an episode in past month.  Notes anxiety 'kicks in when having them.' Last for one minute, resolves. Notes when coughing may feel palpitations. Can also feel when at rest. Notes being under 'huge amount of stress' during last episode. Things less stressful. Resigned this week from work.  No associated chest pain, sob, HA, dizziness, left arm numbness. Very little caffeine.   Per patient, normal stress test 10+ years ago from Dr Humphrey Rolls.    Mammogram UTD , 2018 in September.  dexa 2018 osteopenia Colonoscopy 2016; repeat in 10 years.   HISTORY:  Past Medical History:  Diagnosis Date  . Arthritis    osteoarthritis  . Breast cancer, left ([facility]) 2014   Hx Lumpectomy and Rad tx's.  Marland Kitchen Hyperlipidemia   . Osteopenia    previously taking Evista and Actonel  . Osteoporosis   . Vertigo    Past Surgical History:  Procedure Laterality Date  . CHOLECYSTECTOMY    . lt breast lumpectomy  2014   Family History  Problem Relation Age of Onset  . Osteoporosis Mother   . Hypertension Mother   . Osteoarthritis Mother   . Osteoporosis Sister   . Osteoarthritis Sister   . Osteoarthritis Maternal Grandmother   . Diabetes Maternal Grandfather   . Cancer Paternal Grandfather        unknown  . Prostate cancer Neg Hx   . Bladder Cancer Neg Hx   . Kidney cancer Neg Hx     Allergies: Ciprofloxacin; Ofloxacin; Quinolones; Actonel [risedronate];  Augmentin [amoxicillin-pot clavulanate]; and Bactrim [sulfamethoxazole-trimethoprim] Current Outpatient Medications on File Prior to Visit  Medication Sig Dispense Refill  . Ascorbic Acid (VITAMIN C) 1000 MG tablet Take 1,000 mg by mouth daily.    Marland Kitchen aspirin EC 81 MG tablet Take by mouth.    . B Complex Vitamins (VITAMIN B COMPLEX PO) Take by mouth.    . calcium carbonate (CALCIUM 600) 600 MG TABS tablet Take by mouth. Take 1,200 mg by mouth.    . cholecalciferol (VITAMIN D) 1000 UNITS tablet Take 1,000 Units by mouth daily.    Marland Kitchen CRANBERRY PO Take by mouth.    . diclofenac sodium (VOLTAREN) 1 % GEL Apply 4 g topically 4 (four) times daily. 1 Tube 3  . ibuprofen (ADVIL,MOTRIN) 200 MG tablet Take 600 mg by mouth 2 (two) times daily. prn    . letrozole (FEMARA) 2.5 MG tablet TAKE ONE TABLET BY MOUTH ONCE DAILY 30 tablet 0  . meclizine (ANTIVERT) 25 MG tablet Take 1 tablet (25 mg total) by mouth 3 (three) times daily as needed. 90 tablet 0  . naproxen sodium (ANAPROX) 220 MG tablet Take by mouth.    . simvastatin (ZOCOR) 20 MG tablet TAKE 1 TABLET BY MOUTH IN THE EVENING 90 tablet 1  . cephALEXin (KEFLEX) 250 MG capsule Take 1 capsule (250 mg total) by mouth as needed. (  Patient not taking: Reported on 09/11/2017) 90 capsule 0   No current facility-administered medications on file prior to visit.     Social History   Tobacco Use  . Smoking status: Never Smoker  . Smokeless tobacco: Never Used  Substance Use Topics  . Alcohol use: Yes    Alcohol/week: 0.0 oz    Comment: rare  . Drug use: No    Review of Systems  Constitutional: Negative for chills and fever.  HENT: Positive for congestion and sore throat. Negative for ear pain, sinus pressure and sinus pain.   Respiratory: Positive for cough. Negative for shortness of breath and wheezing.   Cardiovascular: Positive for palpitations. Negative for chest pain and leg swelling.  Gastrointestinal: Negative for nausea and vomiting.    Neurological: Negative for dizziness, syncope and headaches.  Psychiatric/Behavioral: Negative for confusion.      Objective:    BP 138/78 (BP Location: Right Arm, Patient Position: Sitting, Cuff Size: Normal)   Pulse 79   Temp 98.5 F (36.9 C) (Oral)   Resp 20   Ht 5' 11.5" (1.816 m)   Wt 185 lb 2 oz (84 kg)   SpO2 98%   BMI 25.46 kg/m  BP Readings from Last 3 Encounters:  09/11/17 138/78  08/18/17 116/68  02/24/17 119/73   Wt Readings from Last 3 Encounters:  09/11/17 185 lb 2 oz (84 kg)  08/18/17 190 lb 12.8 oz (86.5 kg)  02/24/17 181 lb 3.2 oz (82.2 kg)    Physical Exam  Constitutional: She appears well-developed and well-nourished.  HENT:  Head: Normocephalic and atraumatic.  Right Ear: Hearing, tympanic membrane, external ear and ear canal normal. No drainage, swelling or tenderness. No foreign bodies. Tympanic membrane is not erythematous and not bulging. No middle ear effusion. No decreased hearing is noted.  Left Ear: Hearing, tympanic membrane, external ear and ear canal normal. No drainage, swelling or tenderness. No foreign bodies. Tympanic membrane is not erythematous and not bulging.  No middle ear effusion. No decreased hearing is noted.  Nose: Rhinorrhea present. Right sinus exhibits no maxillary sinus tenderness and no frontal sinus tenderness. Left sinus exhibits no maxillary sinus tenderness and no frontal sinus tenderness.  Mouth/Throat: Uvula is midline, oropharynx is clear and moist and mucous membranes are normal. No oropharyngeal exudate, posterior oropharyngeal edema, posterior oropharyngeal erythema or tonsillar abscesses.  Eyes: Conjunctivae are normal.  Cardiovascular: Regular rhythm, normal heart sounds and normal pulses.  Pulmonary/Chest: Effort normal and breath sounds normal. She has no wheezes. She has no rhonchi. She has no rales.  Lymphadenopathy:       Head (right side): No submental, no submandibular, no tonsillar, no preauricular, no  posterior auricular and no occipital adenopathy present.       Head (left side): No submental, no submandibular, no tonsillar, no preauricular, no posterior auricular and no occipital adenopathy present.    She has no cervical adenopathy.  Neurological: She is alert.  Skin: Skin is warm and dry.  Psychiatric: She has a normal mood and affect. Her speech is normal and behavior is normal. Thought content normal.  Vitals reviewed.      Assessment & Plan:   Problem List Items Addressed This Visit      Respiratory   Bronchitis    Duration x one day. No fever, myalgia to suggest influenza. Strep negative. Conservative therapy at home- patient in agreement. She will let me know if not better.       Relevant Orders  POCT rapid strep A (Completed)     Other   Palpitations - Primary    Resolved at this time.  Reassured is no associated chest pain, shortness of breath.  Patient has regular exercise.  Discussed with ppatient possible triggers include anxiety- of note,  palpitations stopped after patient stress at work improved.  Pending lab panel today.  EKG compared to 2016.  No significant changes noted today.  We discussed the absence of her palpitations we will save hypervigilant at this time.  We discussed her radiation, particularly left side, due to h/o breast cancer. We jointly decided that we would not purse cardiology referral at this time however would consider in near future or certainly if palpitations recur- patient will let me know.      Relevant Orders   CBC with Differential/Platelet   Comprehensive metabolic panel   Hemoglobin A1c   Lipid panel   TSH   VITAMIN D 25 Hydroxy (Vit-D Deficiency, Fractures)   EKG 12-Lead (Completed)       I am having Trinidad Curet maintain her cholecalciferol, ibuprofen, calcium carbonate, letrozole, naproxen sodium, diclofenac sodium, vitamin C, aspirin EC, CRANBERRY PO, B Complex Vitamins (VITAMIN B COMPLEX PO), cephALEXin, meclizine,  and simvastatin.   No orders of the defined types were placed in this encounter.   Return precautions given.   Risks, benefits, and alternatives of the medications and treatment plan prescribed today were discussed, and patient expressed understanding.   Education regarding symptom management and diagnosis given to patient on AVS.  Continue to follow with Burnard Hawthorne, FNP for routine health maintenance.   Deborah Mcconnell and I agreed with plan.   Mable Paris, FNP

## 2017-09-11 NOTE — Progress Notes (Signed)
Pre-visit discussion using our clinic review tool. No additional management support is needed unless otherwise documented below in the visit note.  

## 2017-09-11 NOTE — Assessment & Plan Note (Addendum)
Duration x one day. No fever, myalgia to suggest influenza. Strep negative. Conservative therapy at home- patient in agreement. She will let me know if not better.

## 2017-09-16 ENCOUNTER — Other Ambulatory Visit: Payer: Medicare Other

## 2017-09-21 ENCOUNTER — Other Ambulatory Visit (INDEPENDENT_AMBULATORY_CARE_PROVIDER_SITE_OTHER): Payer: Medicare Other

## 2017-09-21 DIAGNOSIS — R002 Palpitations: Secondary | ICD-10-CM | POA: Diagnosis not present

## 2017-09-21 LAB — COMPREHENSIVE METABOLIC PANEL
ALT: 13 U/L (ref 0–35)
AST: 19 U/L (ref 0–37)
Albumin: 3.8 g/dL (ref 3.5–5.2)
Alkaline Phosphatase: 55 U/L (ref 39–117)
BUN: 13 mg/dL (ref 6–23)
CHLORIDE: 105 meq/L (ref 96–112)
CO2: 29 mEq/L (ref 19–32)
CREATININE: 0.67 mg/dL (ref 0.40–1.20)
Calcium: 9.4 mg/dL (ref 8.4–10.5)
GFR: 93.28 mL/min (ref >60.00)
Glucose, Bld: 107 mg/dL — ABNORMAL HIGH (ref 70–99)
Potassium: 4.3 mEq/L (ref 3.5–5.1)
SODIUM: 140 meq/L (ref 135–145)
Total Bilirubin: 0.6 mg/dL (ref 0.2–1.2)
Total Protein: 7 g/dL (ref 6.0–8.3)

## 2017-09-21 LAB — CBC WITH DIFFERENTIAL/PLATELET
BASOS PCT: 0.9 % (ref 0.0–3.0)
Basophils Absolute: 0 10*3/uL (ref 0.0–0.1)
EOS ABS: 0.2 10*3/uL (ref 0.0–0.7)
Eosinophils Relative: 4.2 % (ref 0.0–5.0)
HCT: 40.8 % (ref 36.0–46.0)
HEMOGLOBIN: 13.9 g/dL (ref 12.0–15.0)
Lymphocytes Relative: 20.2 % (ref 12.0–46.0)
Lymphs Abs: 1.1 10*3/uL (ref 0.7–4.0)
MCHC: 34 g/dL (ref 30.0–36.0)
MCV: 94.2 fl (ref 78.0–100.0)
MONO ABS: 0.5 10*3/uL (ref 0.1–1.0)
Monocytes Relative: 9.5 % (ref 3.0–12.0)
NEUTROS ABS: 3.4 10*3/uL (ref 1.4–7.7)
Neutrophils Relative %: 65.2 % (ref 43.0–77.0)
PLATELETS: 292 10*3/uL (ref 150.0–400.0)
RBC: 4.33 Mil/uL (ref 3.87–5.11)
RDW: 12.9 % (ref 11.5–15.5)
WBC: 5.2 10*3/uL (ref 4.0–10.5)

## 2017-09-21 LAB — LIPID PANEL
CHOL/HDL RATIO: 2
CHOLESTEROL: 144 mg/dL (ref 0–200)
HDL: 61.3 mg/dL (ref >39.00)
LDL Cholesterol: 70 mg/dL (ref 0–99)
NonHDL: 82.77
TRIGLYCERIDES: 63 mg/dL (ref 0.0–149.0)
VLDL: 12.6 mg/dL (ref 0.0–40.0)

## 2017-09-21 LAB — TSH: TSH: 2.31 u[IU]/mL (ref 0.35–4.50)

## 2017-09-21 LAB — VITAMIN D 25 HYDROXY (VIT D DEFICIENCY, FRACTURES): VITD: 26.61 ng/mL — AB (ref 30.00–100.00)

## 2017-09-21 LAB — HEMOGLOBIN A1C: Hgb A1c MFr Bld: 5.6 % (ref 4.6–6.5)

## 2017-09-25 ENCOUNTER — Ambulatory Visit (INDEPENDENT_AMBULATORY_CARE_PROVIDER_SITE_OTHER): Payer: Medicare Other | Admitting: Internal Medicine

## 2017-09-25 ENCOUNTER — Encounter: Payer: Self-pay | Admitting: Family

## 2017-09-25 ENCOUNTER — Other Ambulatory Visit: Payer: Self-pay | Admitting: Family

## 2017-09-25 ENCOUNTER — Encounter: Payer: Self-pay | Admitting: Internal Medicine

## 2017-09-25 VITALS — BP 114/60 | HR 84 | Temp 98.1°F | Resp 18 | Ht 71.5 in | Wt 188.5 lb

## 2017-09-25 DIAGNOSIS — R002 Palpitations: Secondary | ICD-10-CM

## 2017-09-25 DIAGNOSIS — H02823 Cysts of right eye, unspecified eyelid: Secondary | ICD-10-CM

## 2017-09-25 MED ORDER — DOXYCYCLINE HYCLATE 100 MG PO TABS: 100.0000 mg | ORAL_TABLET | Freq: Two times a day (BID) | ORAL | 0 refills | Status: DC

## 2017-09-25 MED ORDER — ERYTHROMYCIN 5 MG/GM OP OINT: 1.0000 "application " | TOPICAL_OINTMENT | Freq: Every day | OPHTHALMIC | 0 refills | Status: DC

## 2017-09-25 NOTE — Progress Notes (Signed)
Chief Complaint  Patient presents with  . Acute Visit    corner of right inner eye has a papule shaped bump. sore to touch.  . Palpitations   Acute visit  1. Right lower eyelid swollen and painful under left eye with touch since Weds. She notices a bump to right lower eyelid and feels like something deeper she wears contacts and has taken them out today and wearing glasses. Whites of eyes are not red. She tried a warm washcloth which makes it feel better some. She feels like underneath eyes are puffy and she did have yellow drainage  2. Palpitations addressed with PCP last visit had 1x today.    Review of Systems  Eyes:       +eyelid lesion   Respiratory: Negative for cough.   Cardiovascular: Positive for chest pain.   Past Medical History:  Diagnosis Date  . Arthritis    osteoarthritis  . Breast cancer, left ([place]) 2014   Hx Lumpectomy and Rad tx's.  Marland Kitchen Hyperlipidemia   . Osteopenia    previously taking Evista and Actonel  . Osteoporosis   . Vertigo    Past Surgical History:  Procedure Laterality Date  . CHOLECYSTECTOMY    . lt breast lumpectomy  2014   Family History  Problem Relation Age of Onset  . Osteoporosis Mother   . Hypertension Mother   . Osteoarthritis Mother   . Osteoporosis Sister   . Osteoarthritis Sister   . Osteoarthritis Maternal Grandmother   . Diabetes Maternal Grandfather   . Cancer Paternal Grandfather        unknown  . Prostate cancer Neg Hx   . Bladder Cancer Neg Hx   . Kidney cancer Neg Hx    Social History   Socioeconomic History  . Marital status: Married    Spouse name: Not on file  . Number of children: Not on file  . Years of education: Not on file  . Highest education level: Not on file  Occupational History  . Not on file  Social Needs  . Financial resource strain: Not on file  . Food insecurity:    Worry: Not on file    Inability: Not on file  . Transportation needs:    Medical: Not on file    Non-medical: Not on file    Tobacco Use  . Smoking status: Never Smoker  . Smokeless tobacco: Never Used  Substance and Sexual Activity  . Alcohol use: Yes    Alcohol/week: 0.0 oz    Comment: rare  . Drug use: No  . Sexual activity: Yes  Lifestyle  . Physical activity:    Days per week: Not on file    Minutes per session: Not on file  . Stress: Not on file  Relationships  . Social connections:    Talks on phone: Not on file    Gets together: Not on file    Attends religious service: Not on file    Active member of club or organization: Not on file    Attends meetings of clubs or organizations: Not on file    Relationship status: Not on file  . Intimate partner violence:    Fear of current or ex partner: Not on file    Emotionally abused: Not on file    Physically abused: Not on file    Forced sexual activity: Not on file  Other Topics Concern  . Not on file  Social History Narrative   'lindy'  Works at [employer] in [place].       Married.       Exercise- not excercising.          Current Meds  Medication Sig  . Ascorbic Acid (VITAMIN C) 1000 MG tablet Take 1,000 mg by mouth daily.  Marland Kitchen aspirin EC 81 MG tablet Take by mouth.  . B Complex Vitamins (VITAMIN B COMPLEX PO) Take by mouth.  . calcium carbonate (CALCIUM 600) 600 MG TABS tablet Take by mouth. Take 1,200 mg by mouth.  . cephALEXin (KEFLEX) 250 MG capsule Take 1 capsule (250 mg total) by mouth as needed.  . cholecalciferol (VITAMIN D) 1000 UNITS tablet Take 1,000 Units by mouth daily.  Marland Kitchen CRANBERRY PO Take by mouth.  . diclofenac sodium (VOLTAREN) 1 % GEL Apply 4 g topically 4 (four) times daily.  Marland Kitchen ibuprofen (ADVIL,MOTRIN) 200 MG tablet Take 600 mg by mouth 2 (two) times daily. prn  . letrozole (FEMARA) 2.5 MG tablet TAKE ONE TABLET BY MOUTH ONCE DAILY  . meclizine (ANTIVERT) 25 MG tablet Take 1 tablet (25 mg total) by mouth 3 (three) times daily as needed.  . naproxen sodium (ANAPROX) 220 MG tablet Take by mouth.  .  simvastatin (ZOCOR) 20 MG tablet TAKE 1 TABLET BY MOUTH IN THE EVENING   Allergies  Allergen Reactions  . Ciprofloxacin Anaphylaxis    Causes death  . Ofloxacin Anaphylaxis    Causes Death  . Quinolones Anaphylaxis  . Actonel [Risedronate]     Stomach pain  . Augmentin [Amoxicillin-Pot Clavulanate] Nausea And Vomiting  . Bactrim [Sulfamethoxazole-Trimethoprim] Hives    Itching, hives, and GI distress   Recent Results (from the past 2160 hour(s))  POCT rapid strep A     Status: None   Collection Time: 09/11/17 10:11 AM  Result Value Ref Range   Rapid Strep A Screen Negative Negative  VITAMIN D 25 Hydroxy (Vit-D Deficiency, Fractures)     Status: Abnormal   Collection Time: 09/21/17  8:37 AM  Result Value Ref Range   VITD 26.61 (L) 30.00 - 100.00 ng/mL  TSH     Status: None   Collection Time: 09/21/17  8:37 AM  Result Value Ref Range   TSH 2.31 0.35 - 4.50 uIU/mL  Lipid panel     Status: None   Collection Time: 09/21/17  8:37 AM  Result Value Ref Range   Cholesterol 144 0 - 200 mg/dL    Comment: ATP III Classification       Desirable:  < 200 mg/dL               Borderline High:  200 - 239 mg/dL          High:  > = 240 mg/dL   Triglycerides 63.0 0.0 - 149.0 mg/dL    Comment: Normal:  <150 mg/dLBorderline High:  150 - 199 mg/dL   HDL 61.30 >39.00 mg/dL   VLDL 12.6 0.0 - 40.0 mg/dL   LDL Cholesterol 70 0 - 99 mg/dL   Total CHOL/HDL Ratio 2     Comment:                Men          Women1/2 Average Risk     3.4          3.3Average Risk          5.0          4.42X Average Risk  9.6          7.13X Average Risk          15.0          11.0                       NonHDL 82.77     Comment: NOTE:  Non-HDL goal should be 30 mg/dL higher than patient's LDL goal (i.e. LDL goal of < 70 mg/dL, would have non-HDL goal of < 100 mg/dL)  Hemoglobin A1c     Status: None   Collection Time: 09/21/17  8:37 AM  Result Value Ref Range   Hgb A1c MFr Bld 5.6 4.6 - 6.5 %    Comment: Glycemic  Control Guidelines for People with Diabetes:Non Diabetic:  <6%Goal of Therapy: <7%Additional Action Suggested:  >8%   Comprehensive metabolic panel     Status: Abnormal   Collection Time: 09/21/17  8:37 AM  Result Value Ref Range   Sodium 140 135 - 145 mEq/L   Potassium 4.3 3.5 - 5.1 mEq/L   Chloride 105 96 - 112 mEq/L   CO2 29 19 - 32 mEq/L   Glucose, Bld 107 (H) 70 - 99 mg/dL   BUN 13 6 - 23 mg/dL   Creatinine, Ser 0.67 0.40 - 1.20 mg/dL   Total Bilirubin 0.6 0.2 - 1.2 mg/dL   Alkaline Phosphatase 55 39 - 117 U/L   AST 19 0 - 37 U/L   ALT 13 0 - 35 U/L   Total Protein 7.0 6.0 - 8.3 g/dL   Albumin 3.8 3.5 - 5.2 g/dL   Calcium 9.4 8.4 - 10.5 mg/dL   GFR 93.28 >60.00 mL/min  CBC with Differential/Platelet     Status: None   Collection Time: 09/21/17  8:37 AM  Result Value Ref Range   WBC 5.2 4.0 - 10.5 K/uL   RBC 4.33 3.87 - 5.11 Mil/uL   Hemoglobin 13.9 12.0 - 15.0 g/dL   HCT 40.8 36.0 - 46.0 %   MCV 94.2 78.0 - 100.0 fl   MCHC 34.0 30.0 - 36.0 g/dL   RDW 12.9 11.5 - 15.5 %   Platelets 292.0 150.0 - 400.0 K/uL   Neutrophils Relative % 65.2 43.0 - 77.0 %   Lymphocytes Relative 20.2 12.0 - 46.0 %   Monocytes Relative 9.5 3.0 - 12.0 %   Eosinophils Relative 4.2 0.0 - 5.0 %   Basophils Relative 0.9 0.0 - 3.0 %   Neutro Abs 3.4 1.4 - 7.7 K/uL   Lymphs Abs 1.1 0.7 - 4.0 K/uL   Monocytes Absolute 0.5 0.1 - 1.0 K/uL   Eosinophils Absolute 0.2 0.0 - 0.7 K/uL   Basophils Absolute 0.0 0.0 - 0.1 K/uL   Objective  Body mass index is 25.92 kg/m. Wt Readings from Last 3 Encounters:  09/25/17 188 lb 8 oz (85.5 kg)  09/11/17 185 lb 2 oz (84 kg)  08/18/17 190 lb 12.8 oz (86.5 kg)   Temp Readings from Last 3 Encounters:  09/25/17 98.1 F (36.7 C) (Oral)  09/11/17 98.5 F (36.9 C) (Oral)  08/18/17 98.2 F (36.8 C) (Oral)   BP Readings from Last 3 Encounters:  09/25/17 114/60  09/11/17 138/78  08/18/17 116/68   Pulse Readings from Last 3 Encounters:  09/25/17 84  09/11/17  79  08/18/17 80    Physical Exam  Constitutional: She is oriented to person, place, and time and well-developed, well-nourished, and in no distress. Vital signs are  normal.  HENT:  Head: Normocephalic and atraumatic.  Eyes: Pupils are equal, round, and reactive to light. Conjunctivae are normal.    Cardiovascular: Normal rate, regular rhythm and normal heart sounds.  Pulmonary/Chest: Effort normal and breath sounds normal.  Neurological: She is alert and oriented to person, place, and time. Gait normal. Gait normal.  Skin: Skin is warm and dry.  Psychiatric: Mood, memory, affect and judgment normal.  Nursing note and vitals reviewed.   Assessment   1. Cyst to right lower eyelid and interior eyelid larger cyst  2. Palpitations  Plan  1. Doxy bid x 1 week  Emycin topically to eye  Warm compress F/u with eye MD next week if not better 2. Either I or PCP consider referral to cards further w/u I.e holter, etc disc today pt agreeable to referral    Provider: Dr. Olivia Mackie McLean-Scocuzza-[facility]

## 2017-09-25 NOTE — Progress Notes (Signed)
Pre-visit discussion using our clinic review tool. No additional management support is needed unless otherwise documented below in the visit note.  

## 2017-09-25 NOTE — Patient Instructions (Addendum)
Next week if not better please call your eye doctor for appointment  Try warm compress Take care  F/u 02/2018 with PCP    Palpitations A palpitation is the feeling that your heartbeat is irregular or is faster than normal. It may feel like your heart is fluttering or skipping a beat. Palpitations are usually not a serious problem. They may be caused by many things, including smoking, caffeine, alcohol, stress, and certain medicines. Although most causes of palpitations are not serious, palpitations can be a sign of a serious medical problem. In some cases, you may need further medical evaluation. Follow these instructions at home: Pay attention to any changes in your symptoms. Take these actions to help with your condition:  Avoid the following: ? Caffeinated coffee, tea, soft drinks, diet pills, and energy drinks. ? Chocolate. ? Alcohol.  Do not use any tobacco products, such as cigarettes, chewing tobacco, and e-cigarettes. If you need help quitting, ask your health care provider.  Try to reduce your stress and anxiety. Things that can help you relax include: ? Yoga. ? Meditation. ? Physical activity, such as swimming, jogging, or walking. ? Biofeedback. This is a method that helps you learn to use your mind to control things in your body, such as your heartbeats.  Get plenty of rest and sleep.  Take over-the-counter and prescription medicines only as told by your health care provider.  Keep all follow-up visits as told by your health care provider. This is important.  Contact a health care provider if:  You continue to have a fast or irregular heartbeat after 24 hours.  Your palpitations occur more often. Get help right away if:  You have chest pain or shortness of breath.  You have a severe headache.  You feel dizzy or you faint. This information is not intended to replace advice given to you by your health care provider. Make sure you discuss any questions you have with  your health care provider. Document Released: 06/13/2000 Document Revised: 11/19/2015 Document Reviewed: 03/01/2015 Elsevier Interactive Patient Education  [employer].

## 2017-11-25 NOTE — Progress Notes (Signed)
11/26/2017 8:53 AM   Deborah Mcconnell Mar 14, 1951 [id number]  Referring provider: Burnard Hawthorne, FNP [address] [place], [place] [address]  Chief Complaint  Patient presents with  . Recurrent UTI    HPI: 67 yo WF with a history of recurrent UTI's and a history of hematuria who presents today for a one year follow up.    History of recurrent UTI's Urine culture  10/21/2016 Variable resistant E.Coli  02/03/2017  Pan-sensitive E. Coli  02/24/2017 Pan-sensitive E. Coli  Currently on post-coital Keflex Not a candidate for vaginal estrogen cream due to history of breast cancer Patient has increased her water intake and is taking cranberry tablets.    History of hematuria Patient has had several hematuria workups in the past with no worrisome findings.  She does not report any gross hematuria at this time.     PMH: Past Medical History:  Diagnosis Date  . Arthritis    osteoarthritis  . Breast cancer, left ([place]) 2014   Hx Lumpectomy and Rad tx's.  Deborah Mcconnell Hyperlipidemia   . Osteopenia    previously taking Evista and Actonel  . Osteoporosis   . Vertigo     Surgical History: Past Surgical History:  Procedure Laterality Date  . CHOLECYSTECTOMY    . lt breast lumpectomy  2014    Home Medications:  Allergies as of 11/26/2017      Reactions   Ciprofloxacin Anaphylaxis   Causes death   Ofloxacin Anaphylaxis   Causes Death   Quinolones Anaphylaxis   Actonel [risedronate]    Stomach pain   Augmentin [amoxicillin-pot Clavulanate] Nausea And Vomiting   Bactrim [sulfamethoxazole-trimethoprim] Hives   Itching, hives, and GI distress      Medication List        Accurate as of 11/26/17  8:53 AM. Always use your most recent med list.          aspirin EC 81 MG tablet Take by mouth.   CALCIUM 600 600 MG Tabs tablet Generic drug:  calcium carbonate Take by mouth. Take 1,200 mg by mouth.   cephALEXin 250 MG capsule Commonly known as:   KEFLEX Take 1 capsule (250 mg total) by mouth as needed.   cholecalciferol 1000 units tablet Commonly known as:  VITAMIN D Take 1,000 Units by mouth daily.   CRANBERRY PO Take by mouth.   diclofenac sodium 1 % Gel Commonly known as:  VOLTAREN Apply 4 g topically 4 (four) times daily.   ibuprofen 200 MG tablet Commonly known as:  ADVIL,MOTRIN Take 600 mg by mouth 2 (two) times daily. prn   letrozole 2.5 MG tablet Commonly known as:  FEMARA TAKE ONE TABLET BY MOUTH ONCE DAILY   meclizine 25 MG tablet Commonly known as:  ANTIVERT Take 1 tablet (25 mg total) by mouth 3 (three) times daily as needed.   naproxen sodium 220 MG tablet Commonly known as:  ALEVE Take by mouth.   simvastatin 20 MG tablet Commonly known as:  ZOCOR TAKE 1 TABLET BY MOUTH IN THE EVENING   VITAMIN B COMPLEX PO Take by mouth.   vitamin C 1000 MG tablet Take 1,000 mg by mouth daily.       Allergies:  Allergies  Allergen Reactions  . Ciprofloxacin Anaphylaxis    Causes death  . Ofloxacin Anaphylaxis    Causes Death  . Quinolones Anaphylaxis  . Actonel [Risedronate]     Stomach pain  . Augmentin [Amoxicillin-Pot Clavulanate] Nausea And Vomiting  .  Bactrim [Sulfamethoxazole-Trimethoprim] Hives    Itching, hives, and GI distress    Family History: Family History  Problem Relation Age of Onset  . Osteoporosis Mother   . Hypertension Mother   . Osteoarthritis Mother   . Osteoporosis Sister   . Osteoarthritis Sister   . Osteoarthritis Maternal Grandmother   . Diabetes Maternal Grandfather   . Cancer Paternal Grandfather        unknown  . Prostate cancer Neg Hx   . Bladder Cancer Neg Hx   . Kidney cancer Neg Hx     Social History:  reports that she has never smoked. She has never used smokeless tobacco. She reports that she drinks alcohol. She reports that she does not use drugs.  ROS: UROLOGY Frequent Urination?: No Hard to postpone urination?: No Burning/pain with urination?:  No Get up at night to urinate?: No Leakage of urine?: No Urine stream starts and stops?: No Trouble starting stream?: No Do you have to strain to urinate?: No Blood in urine?: No Urinary tract infection?: No Sexually transmitted disease?: No Injury to kidneys or bladder?: No Painful intercourse?: No Weak stream?: No Currently pregnant?: No Vaginal bleeding?: No Last menstrual period?: n  Gastrointestinal Nausea?: No Vomiting?: No Indigestion/heartburn?: No Diarrhea?: No Constipation?: No  Constitutional Fever: No Night sweats?: No Weight loss?: No Fatigue?: No  Skin Skin rash/lesions?: No Itching?: No  Eyes Blurred vision?: No Double vision?: No  Ears/Nose/Throat Sore throat?: No Sinus problems?: No  Hematologic/Lymphatic Swollen glands?: No Easy bruising?: No  Cardiovascular Leg swelling?: No Chest pain?: No  Respiratory Cough?: No Shortness of breath?: No  Endocrine Excessive thirst?: No  Musculoskeletal Back pain?: No Joint pain?: No  Neurological Headaches?: No Dizziness?: No  Psychologic Depression?: No Anxiety?: No  Physical Exam: BP 117/77   Pulse 77   Ht 5' 11.5" (1.816 m)   Wt 182 lb 6.4 oz (82.7 kg)   BMI 25.09 kg/m   Constitutional: Well nourished. Alert and oriented, No acute distress. HEENT: [place] AT, moist mucus membranes. Trachea midline, no masses. Cardiovascular: No clubbing, cyanosis, or edema. Respiratory: Normal respiratory effort, no increased work of breathing. Skin: No rashes, bruises or suspicious lesions. Lymph: No cervical or inguinal adenopathy. Neurologic: Grossly intact, no focal deficits, moving all 4 extremities. Psychiatric: Normal mood and affect.  Laboratory Data: Lab Results  Component Value Date   WBC 5.2 09/21/2017   HGB 13.9 09/21/2017   HCT 40.8 09/21/2017   MCV 94.2 09/21/2017   PLT 292.0 09/21/2017    Lab Results  Component Value Date   CREATININE 0.67 09/21/2017     Lab Results   Component Value Date   HGBA1C 5.6 09/21/2017    Lab Results  Component Value Date   TSH 2.31 09/21/2017       Component Value Date/Time   CHOL 144 09/21/2017 0837   HDL 61.30 09/21/2017 0837   CHOLHDL 2 09/21/2017 0837   VLDL 12.6 09/21/2017 0837   LDLCALC 70 09/21/2017 0837    Lab Results  Component Value Date   AST 19 09/21/2017   Lab Results  Component Value Date   ALT 13 09/21/2017    I have reviewed the labs.   I have reviewed the labs.  Assessment & Plan:    1. History of recurrent UTI's  - Reviewed UTI prevention strategies  - Reminded patient to contact our office with symptoms of urinary tract infection or requests catheterize specimens for analysis her culture if we are unavailable                   -  Urinalysis, Complete  2. Vaginal atrophy  - not a candidate for vaginal estrogen cream due to breast cancer  3. History of hematuria Patient has had several hematuria work-ups with no worrisome findings.  We will only repeat studies if patient should develop gross hematuria in the future  Return in about 1 year (around 11/27/2018) for follow up.  These notes generated with voice recognition software. I apologize for typographical errors.  Zara Council, PA-C  [facility] [address] [place], [place] [address] [phone]

## 2017-11-26 ENCOUNTER — Ambulatory Visit (INDEPENDENT_AMBULATORY_CARE_PROVIDER_SITE_OTHER): Payer: Medicare Other | Admitting: Urology

## 2017-11-26 ENCOUNTER — Encounter: Payer: Self-pay | Admitting: Urology

## 2017-11-26 VITALS — BP 117/77 | HR 77 | Ht 71.5 in | Wt 182.4 lb

## 2017-11-26 DIAGNOSIS — Z87448 Personal history of other diseases of urinary system: Secondary | ICD-10-CM | POA: Diagnosis not present

## 2017-11-26 DIAGNOSIS — Z87440 Personal history of urinary (tract) infections: Secondary | ICD-10-CM

## 2017-12-06 DIAGNOSIS — T192XXA Foreign body in vulva and vagina, initial encounter: Secondary | ICD-10-CM | POA: Diagnosis not present

## 2017-12-26 NOTE — Progress Notes (Signed)
Cardiology Office Note  Date:  12/28/2017   ID:  Deborah Mcconnell, DOB [birth year], MRN [id number]  PCP:  Burnard Hawthorne, FNP   Chief Complaint  Patient presents with  . New Patient (Initial Visit)    Referred by PCP for palpitations. Patient states that coughing or laughing will bring them on.  Meds reviewed verbally with patient.     HPI:  Deborah Mcconnell is a 67 year old woman with past medical history of Breast cancer on the left, radiation treatment 5 years ago Palpitations Previous Holter monitor 2010 showing APCs and PVCs, rare Hyperlipidemia, treated Nondiabetic, nonsmoker Who presents by referral from Deborah Mcconnell for palpitations  She reports having rare episodes of palpitations radiating up into her neck Sometimes weekly, none in the past month Causes some anxiety One episode of tachycardia 2 weeks ago resolved on its own after 1 minute Does not really want medications for them, Do not really bother her that much as just started noticing them when coughing may feel palpitations.  Sometimes palpitations seem to cluster over several days More at night, feels them at rest  Recent stressors at work Reports that she is retiring at the end of the month No associated chest pain, sob, HA, dizziness, left arm numbness. Very little caffeine.   EKG personally reviewed by myself on todays visit Shows normal sinus rhythm rate 61 bpm no significant ST or T-wave changes  PMH:   has a past medical history of Arthritis, Breast cancer, left ([place]) (2014), Hyperlipidemia, Osteopenia, Osteoporosis, and Vertigo.  PSH:    Past Surgical History:  Procedure Laterality Date  . CHOLECYSTECTOMY    . lt breast lumpectomy  2014    Current Outpatient Medications  Medication Sig Dispense Refill  . Ascorbic Acid (VITAMIN C) 1000 MG tablet Take 1,000 mg by mouth daily.    Marland Kitchen aspirin EC 81 MG tablet Take 81 mg by mouth.     . B Complex Vitamins (VITAMIN B COMPLEX PO) Take by mouth.     . calcium carbonate (CALCIUM 600) 600 MG TABS tablet Take by mouth. Take 1,200 mg by mouth.    . cephALEXin (KEFLEX) 250 MG capsule Take 1 capsule (250 mg total) by mouth as needed. 90 capsule 0  . cholecalciferol (VITAMIN D) 1000 UNITS tablet Take 1,000 Units by mouth daily.    Marland Kitchen CRANBERRY PO Take by mouth.    . diclofenac sodium (VOLTAREN) 1 % GEL Apply 4 g topically 4 (four) times daily. 1 Tube 3  . ibuprofen (ADVIL,MOTRIN) 200 MG tablet Take 600 mg by mouth 2 (two) times daily. prn    . letrozole (FEMARA) 2.5 MG tablet TAKE ONE TABLET BY MOUTH ONCE DAILY 30 tablet 0  . meclizine (ANTIVERT) 25 MG tablet Take 1 tablet (25 mg total) by mouth 3 (three) times daily as needed. 90 tablet 0  . naproxen sodium (ANAPROX) 220 MG tablet Take by mouth.    . simvastatin (ZOCOR) 20 MG tablet TAKE 1 TABLET BY MOUTH IN THE EVENING 90 tablet 1   No current facility-administered medications for this visit.      Allergies:   Ciprofloxacin; Ofloxacin; Quinolones; Actonel [risedronate]; Augmentin [amoxicillin-pot clavulanate]; and Bactrim [sulfamethoxazole-trimethoprim]   Social History:  The patient  reports that she has never smoked. She has never used smokeless tobacco. She reports that she drinks alcohol. She reports that she does not use drugs.   Family History:   family history includes Cancer in her paternal grandfather; Diabetes in her maternal  grandfather; Hypertension in her mother; Osteoarthritis in her maternal grandmother, mother, and sister; Osteoporosis in her mother and sister.    Review of Systems: Review of Systems  Constitutional: Negative.   Respiratory: Negative.   Cardiovascular: Positive for palpitations.       Tachycardia  Gastrointestinal: Negative.   Musculoskeletal: Negative.   Neurological: Negative.   Psychiatric/Behavioral: Negative.   All other systems reviewed and are negative.    PHYSICAL EXAM: VS:  BP 130/80 (BP Location: Right Arm, Patient Position: Sitting,  Cuff Size: Normal)   Pulse 61   Ht 5' 11.5" (1.816 m)   Wt 183 lb (83 kg)   BMI 25.17 kg/m  , BMI Body mass index is 25.17 kg/m. GEN: Well nourished, well developed, in no acute distress  HEENT: normal  Neck: no JVD, carotid bruits, or masses Cardiac: RRR; no murmurs, rubs, or gallops,no edema  Respiratory:  clear to auscultation bilaterally, normal work of breathing GI: soft, nontender, nondistended, + BS Deborah: no deformity or atrophy  Skin: warm and dry, no rash Neuro:  Strength and sensation are intact Psych: euthymic mood, full affect   Recent Labs: 09/21/2017: ALT 13; BUN 13; Creatinine, Ser 0.67; Hemoglobin 13.9; Platelets 292.0; Potassium 4.3; Sodium 140; TSH 2.31    Lipid Panel Lab Results  Component Value Date   CHOL 144 09/21/2017   HDL 61.30 09/21/2017   LDLCALC 70 09/21/2017   TRIG 63.0 09/21/2017      Wt Readings from Last 3 Encounters:  12/28/17 183 lb (83 kg)  11/26/17 182 lb 6.4 oz (82.7 kg)  09/25/17 188 lb 8 oz (85.5 kg)       ASSESSMENT AND PLAN:  Palpitations - Plan: EKG 12-Lead Previous Holter monitor 2010 showing rare APCs and PVCs We did discuss wearing a event monitor if symptoms get worse. She has declined at this time We did discuss use of beta blockers again if symptoms get worse. She does not think they are severe enough to warrant medications Recommended she call us if symptoms get worse Unable to exclude other arrhythmia such as atrial fibrillation, discussed with her. More likely ectopy given her description  Mixed hyperlipidemia - Plan: EKG 12-Lead Cholesterol is at goal on the current lipid regimen. No changes to the medications were made.  Malignant neoplasm of left female breast, unspecified estrogen receptor status, unspecified site of breast ([place]) Nonsmoker, no diabetes Cholesterol at goal We reviewed CT scan abdomen from 2018 showing no significant aortic atherosclerosis We did discuss CT coronary calcium scoring, likely not  needed at this time Aspirin is not needed   Disposition:   F/U   As needed   Total encounter time more than 60 minutes  Greater than 50% was spent in counseling and coordination of care with the patient    Orders Placed This Encounter  Procedures  . EKG 12-Lead     Signed, Esmond Plants, M.D., Ph.D. 12/28/2017  [place], [place]

## 2017-12-28 ENCOUNTER — Ambulatory Visit (INDEPENDENT_AMBULATORY_CARE_PROVIDER_SITE_OTHER): Payer: Medicare Other | Admitting: Cardiovascular Disease

## 2017-12-28 ENCOUNTER — Encounter: Payer: Self-pay | Admitting: Cardiovascular Disease

## 2017-12-28 VITALS — BP 130/80 | HR 61 | Ht 71.5 in | Wt 183.0 lb

## 2017-12-28 DIAGNOSIS — E782 Mixed hyperlipidemia: Secondary | ICD-10-CM

## 2017-12-28 DIAGNOSIS — C50912 Malignant neoplasm of unspecified site of left female breast: Secondary | ICD-10-CM

## 2017-12-28 DIAGNOSIS — R002 Palpitations: Secondary | ICD-10-CM

## 2017-12-28 DIAGNOSIS — I479 Paroxysmal tachycardia, unspecified: Secondary | ICD-10-CM | POA: Insufficient documentation

## 2017-12-28 NOTE — Patient Instructions (Addendum)
Apps store: Search for "pulse meter" "instant heart rate"  Google: smartphone EKG Alive cor  2 week monitor, Zio patch  You have and PAC/PVCs If sx get worse, we could try  B-blocker (prorpanolol0  Medication Instructions:   No medication changes made  Labwork:  No new labs needed  Testing/Procedures:  Research CT coronary calcium score $150  Follow-Up: It was a pleasure seeing you in the office today. Please call us if you have new issues that need to be addressed before your next appt.  [phone]  Your physician wants you to follow-up in:  As needed  If you need a refill on your cardiac medications before your next appointment, please call your pharmacy.  For educational health videos Log in to : [email or web address] Or : [email or web address], password : [email or web address]

## 2018-02-27 ENCOUNTER — Other Ambulatory Visit: Payer: Self-pay | Admitting: Family

## 2018-02-27 DIAGNOSIS — E782 Mixed hyperlipidemia: Secondary | ICD-10-CM

## 2018-03-02 ENCOUNTER — Other Ambulatory Visit: Payer: Self-pay

## 2018-03-10 DIAGNOSIS — Z170 Estrogen receptor positive status [ER+]: Secondary | ICD-10-CM | POA: Diagnosis not present

## 2018-03-10 DIAGNOSIS — R922 Inconclusive mammogram: Secondary | ICD-10-CM | POA: Diagnosis not present

## 2018-03-10 DIAGNOSIS — C50312 Malignant neoplasm of lower-inner quadrant of left female breast: Secondary | ICD-10-CM | POA: Diagnosis not present

## 2018-03-31 DIAGNOSIS — M8589 Other specified disorders of bone density and structure, multiple sites: Secondary | ICD-10-CM | POA: Diagnosis not present

## 2018-03-31 DIAGNOSIS — Z23 Encounter for immunization: Secondary | ICD-10-CM | POA: Diagnosis not present

## 2018-03-31 DIAGNOSIS — E559 Vitamin D deficiency, unspecified: Secondary | ICD-10-CM | POA: Diagnosis not present

## 2018-03-31 DIAGNOSIS — Z79811 Long term (current) use of aromatase inhibitors: Secondary | ICD-10-CM | POA: Diagnosis not present

## 2018-04-16 IMAGING — CT CT ABD-PEL WO/W CM
3 of 6 series · 14 of 32 positions shown, 19 images · IV contrast (APPLIED)
Comparison: 02/25/2013

CLINICAL DATA: Intermittent microscopic hematuria.

EXAM:
CT ABDOMEN AND PELVIS WITHOUT AND WITH CONTRAST
TECHNIQUE: Multidetector CT imaging of the abdomen and pelvis was performed
following the standard protocol before and following the bolus
administration of intravenous contrast.
CONTRAST:  125mL X1L9HG-MAA IOPAMIDOL (X1L9HG-MAA) INJECTION 61%

[Series 2: axial pre · axial · non-contrast · 0.74mm/px · z∈[-995,-920]mm · 2 of 90 slices shown]
[im 15/90  soft-tissue]
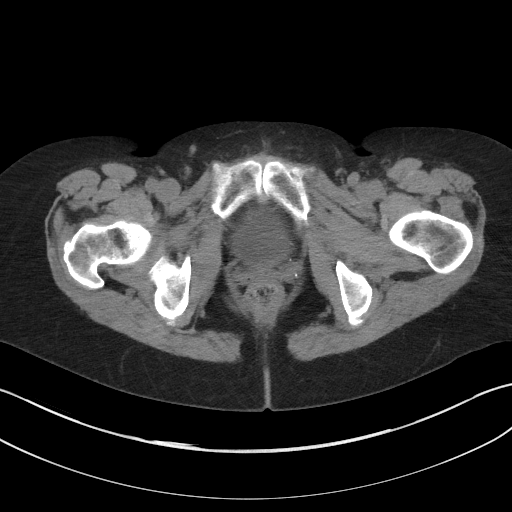
[im 30/90  soft-tissue]
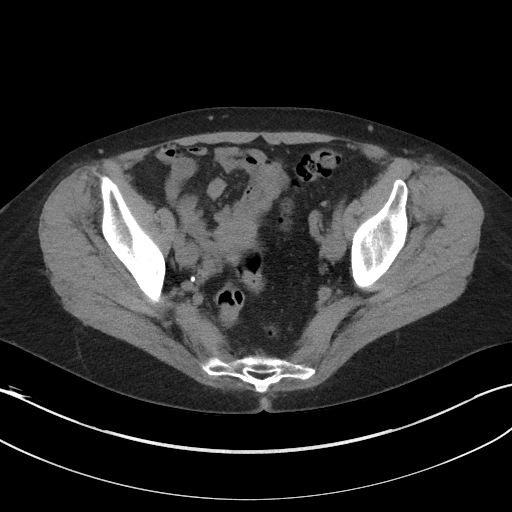

[Series 4: axial post · axial · 0.74mm/px · z∈[-1005,-685]mm · 6 of 90 slices shown]
[im 13/90  soft-tissue]
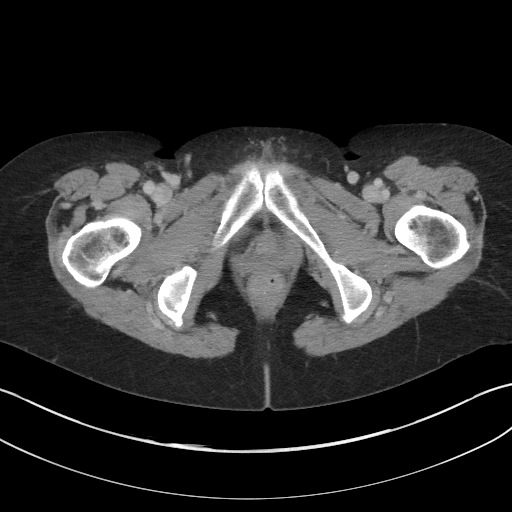
[im 26/90  soft-tissue]
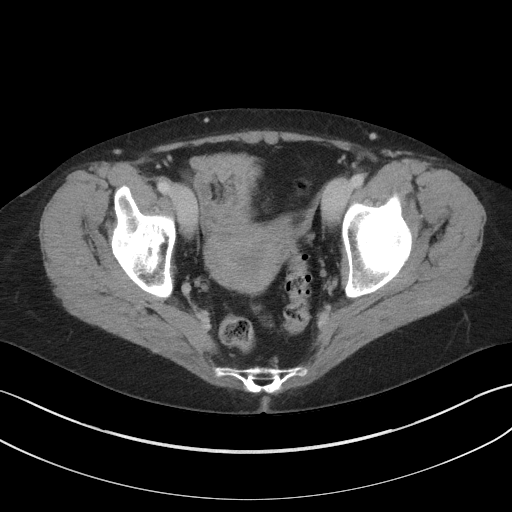
[im 39/90  soft-tissue]
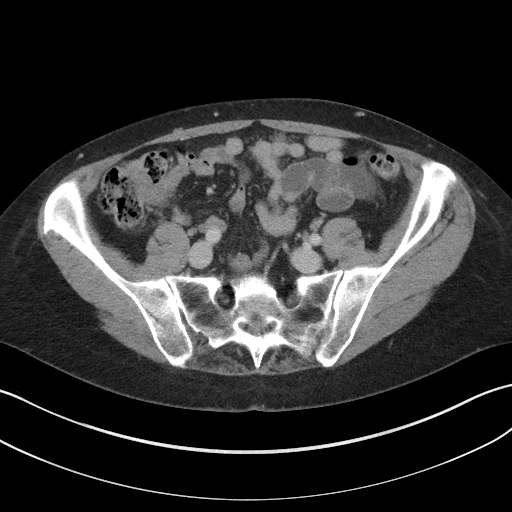
[im 51/90  soft-tissue]
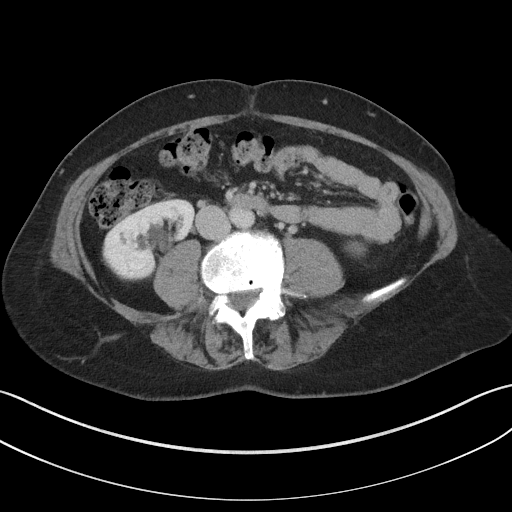
[im 64/90  soft-tissue]
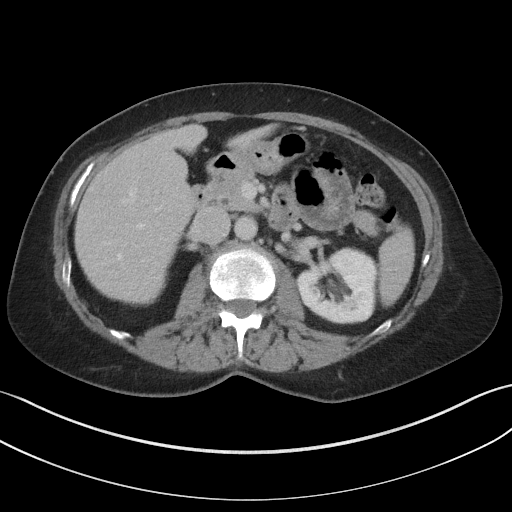
[im 77/90  soft-tissue]
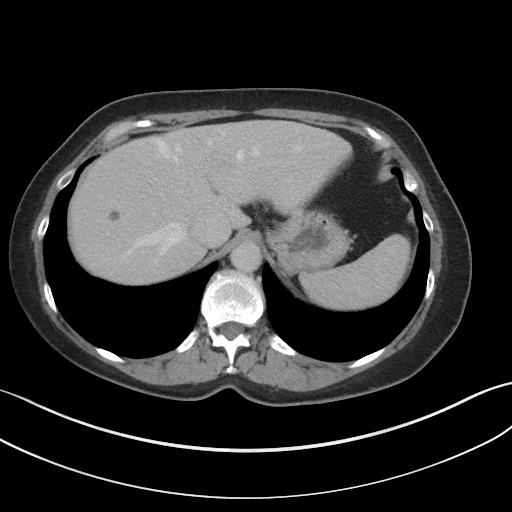

[Series 13: axial delay · axial · delayed · 0.68mm/px · z∈[-1001,-666]mm · 6 of 95 slices shown, 11 images]
[im 14/95  soft-tissue]
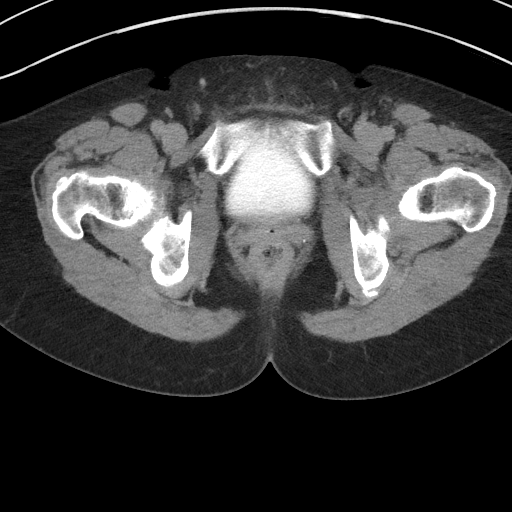
[im 14/95  bone]
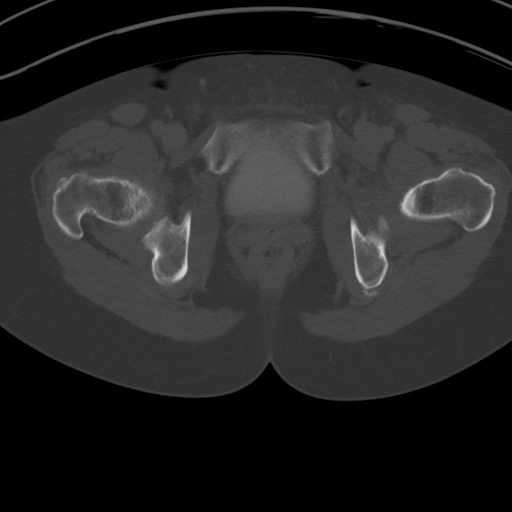
[im 27/95  soft-tissue]
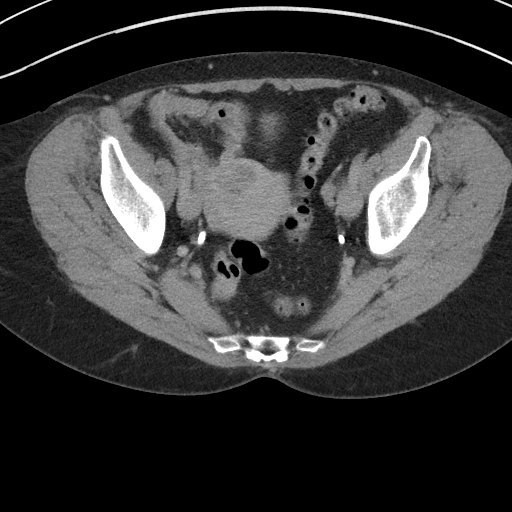
[im 41/95  soft-tissue]
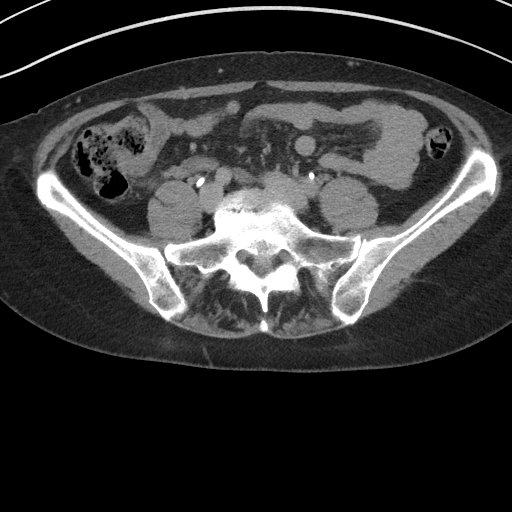
[im 41/95  lung]
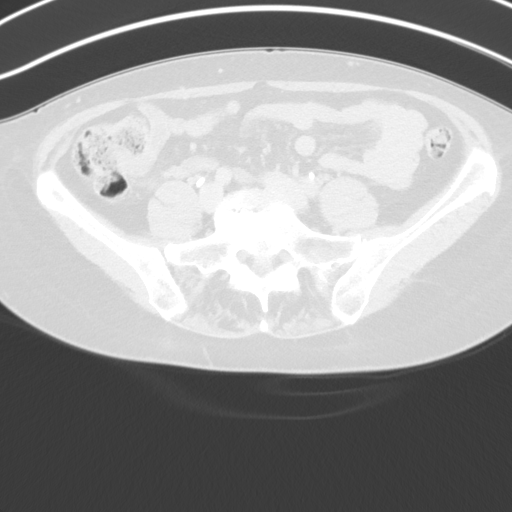
[im 54/95  soft-tissue]
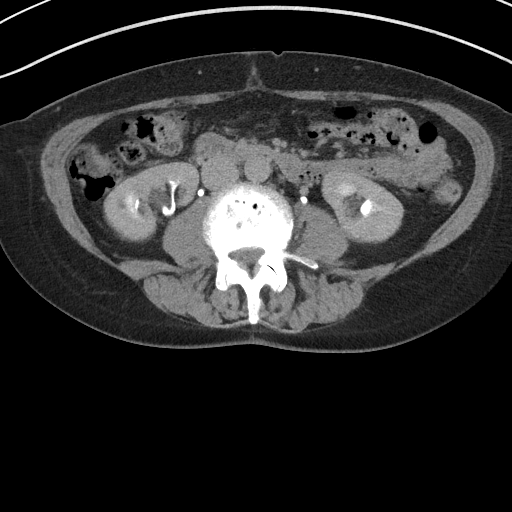
[im 54/95  lung]
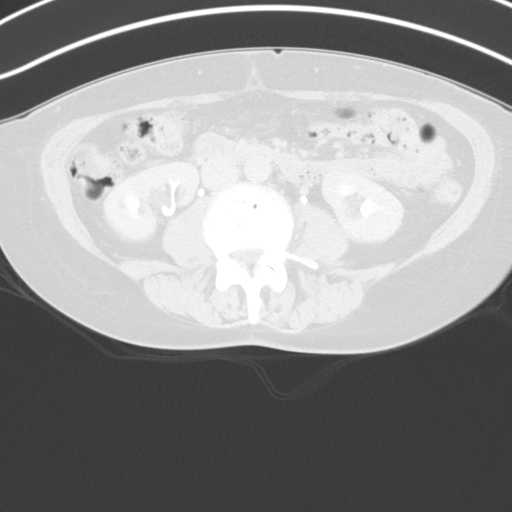
[im 68/95  soft-tissue]
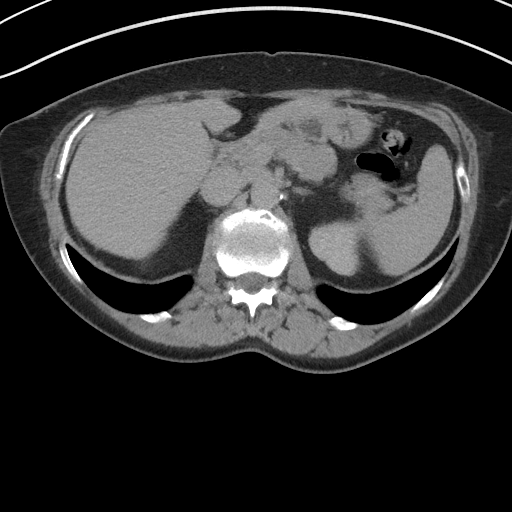
[im 68/95  lung]
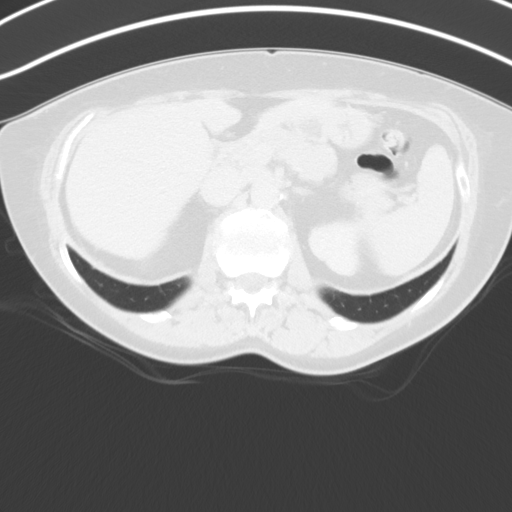
[im 81/95  soft-tissue]
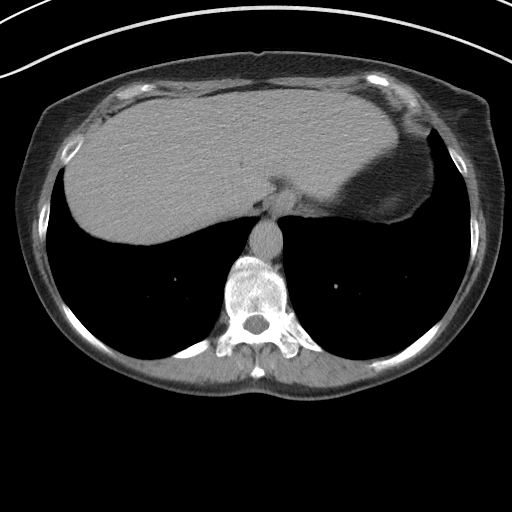
[im 81/95  lung]
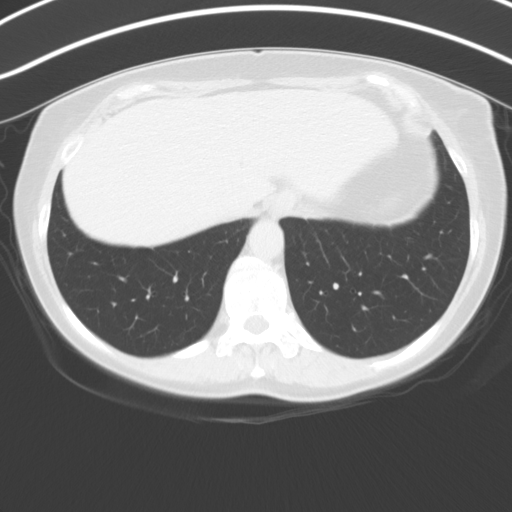

[14 of 32 positions shown; findings below may reference images not displayed]

FINDINGS: Lower chest: No acute abnormality.

Hepatobiliary: Subcentimeter low-density structure along the dome of
liver measures 8 mm and is too small to characterize. Previous
cholecystectomy. No significant biliary dilatation

Pancreas: Unremarkable. No pancreatic ductal dilatation or
surrounding inflammatory changes.

Spleen: Normal in size without focal abnormality.

Adrenals/Urinary Tract: Both the adrenal glands are normal. There
are bilateral parapelvic cysts identified. No hydronephrosis or mass
identified. No kidney stones identified. The urinary bladder appears
normal.

Stomach/Bowel: The stomach is unremarkable. The small bowel loops
have a normal course and caliber. The appendix is visualized and
appears normal. Unremarkable appearance of the colon.

Vascular/Lymphatic: Normal appearance of the abdominal aorta. No
enlarged retroperitoneal or mesenteric adenopathy. No enlarged
pelvic or inguinal lymph nodes.

Reproductive: Uterine fibroids noted.  No adnexal mass.

Other: No abdominal wall hernia or abnormality. No abdominopelvic
ascites.

Musculoskeletal: Degenerative disc disease is identified at L3-4,
L4-5 and L5-S1.
IMPRESSION: 1. No acute findings identified within the abdomen or pelvis.
2. No kidney stones or urinary tract abnormality to explain
patient's hematuria.
3. Bilateral parapelvic kidney cyst.
4. Uterine fibroid

## 2018-07-06 DIAGNOSIS — D225 Melanocytic nevi of trunk: Secondary | ICD-10-CM | POA: Diagnosis not present

## 2018-07-06 DIAGNOSIS — D2262 Melanocytic nevi of left upper limb, including shoulder: Secondary | ICD-10-CM | POA: Diagnosis not present

## 2018-07-06 DIAGNOSIS — D2272 Melanocytic nevi of left lower limb, including hip: Secondary | ICD-10-CM | POA: Diagnosis not present

## 2018-07-06 DIAGNOSIS — D2261 Melanocytic nevi of right upper limb, including shoulder: Secondary | ICD-10-CM | POA: Diagnosis not present

## 2018-07-06 DIAGNOSIS — L821 Other seborrheic keratosis: Secondary | ICD-10-CM | POA: Diagnosis not present

## 2018-07-06 DIAGNOSIS — D2271 Melanocytic nevi of right lower limb, including hip: Secondary | ICD-10-CM | POA: Diagnosis not present

## 2018-07-25 ENCOUNTER — Encounter: Payer: Self-pay | Admitting: Emergency Medicine

## 2018-07-25 ENCOUNTER — Emergency Department: Payer: Medicare Other

## 2018-07-25 ENCOUNTER — Emergency Department
Admission: EM | Admit: 2018-07-25 | Discharge: 2018-07-25 | Disposition: A | Discharge status: Home / Self Care | Payer: Medicare Other | Attending: Student in an Organized Health Care Education/Training Program | Admitting: Student in an Organized Health Care Education/Training Program

## 2018-07-25 DIAGNOSIS — W07XXXA Fall from chair, initial encounter: Secondary | ICD-10-CM | POA: Insufficient documentation

## 2018-07-25 DIAGNOSIS — Y999 Unspecified external cause status: Secondary | ICD-10-CM | POA: Diagnosis not present

## 2018-07-25 DIAGNOSIS — Y929 Unspecified place or not applicable: Secondary | ICD-10-CM | POA: Diagnosis not present

## 2018-07-25 DIAGNOSIS — Y9389 Activity, other specified: Secondary | ICD-10-CM | POA: Insufficient documentation

## 2018-07-25 DIAGNOSIS — S52531A Colles' fracture of right radius, initial encounter for closed fracture: Secondary | ICD-10-CM | POA: Insufficient documentation

## 2018-07-25 DIAGNOSIS — Z79899 Other long term (current) drug therapy: Secondary | ICD-10-CM | POA: Insufficient documentation

## 2018-07-25 DIAGNOSIS — S59911A Unspecified injury of right forearm, initial encounter: Secondary | ICD-10-CM | POA: Diagnosis present

## 2018-07-25 DIAGNOSIS — S52591A Other fractures of lower end of right radius, initial encounter for closed fracture: Secondary | ICD-10-CM | POA: Diagnosis not present

## 2018-07-25 MED ORDER — ACETAMINOPHEN-CODEINE #3 300-30 MG PO TABS: 1.0000 | ORAL_TABLET | ORAL | 0 refills | Status: AC | PRN

## 2018-07-25 MED ORDER — ONDANSETRON HCL 4 MG PO TABS: 4.0000 mg | ORAL_TABLET | Freq: Three times a day (TID) | ORAL | 0 refills | Status: DC | PRN

## 2018-07-25 NOTE — ED Provider Notes (Signed)
[facility] Emergency Department Provider Note ____________________________________________  Time seen: Approximately 2:48 PM  I have reviewed the triage vital signs and the nursing notes.   HISTORY  Chief Complaint Fall and Wrist Pain    HPI Deborah Mcconnell is a 68 y.o. female who presents to the emergency department for evaluation and treatment of right wrist pain post fall. While standing on a chair, she stepped back a little too farand fell backward with her hand outstretched to help break her fall.  She did not experience any loss of consciousness.  She denies headache or other concerns.  Past Medical History:  Diagnosis Date  . Arthritis    osteoarthritis  . Breast cancer, left ([place]) 2014   Hx Lumpectomy and Rad tx's.  Marland Kitchen Hyperlipidemia   . Osteopenia    previously taking Evista and Actonel  . Osteoporosis   . Vertigo     Patient Active Problem List   Diagnosis Date Noted  . Paroxysmal tachycardia ([place]) 12/28/2017  . Palpitations 09/11/2017  . Bronchitis 09/11/2017  . Dysuria 02/03/2017  . Right ear pain 02/03/2017  . Right hip pain 07/29/2016  . Osteoarthritis 04/02/2016  . Screening for cervical cancer 10/08/2015  . Pelvic pain in female 09/10/2015  . Midline thoracic back pain 08/15/2015  . Microscopic hematuria 05/08/2015  . Vertigo 10/27/2014  . Malignant neoplasm of breast (female) ([place]) 11/19/2012  . Routine physical examination 10/27/2012  . Screening for colon cancer 10/27/2012  . Hyperlipidemia 02/17/2011  . Osteopenia 02/17/2011    Past Surgical History:  Procedure Laterality Date  . CHOLECYSTECTOMY    . lt breast lumpectomy  2014    Prior to Admission medications   Medication Sig Start Date End Date Taking? Authorizing Provider  acetaminophen-codeine (TYLENOL #3) 300-30 MG tablet Take 1-2 tablets by mouth every 4 (four) hours as needed for up to 5 days for moderate pain. 07/25/18 07/30/18  Lanore Renderos, Johnette Abraham B, FNP   Ascorbic Acid (VITAMIN C) 1000 MG tablet Take 1,000 mg by mouth daily.    [provider]  B Complex Vitamins (VITAMIN B COMPLEX PO) Take by mouth.    [provider]  calcium carbonate (CALCIUM 600) 600 MG TABS tablet Take by mouth. Take 1,200 mg by mouth.    [provider]  cephALEXin (KEFLEX) 250 MG capsule Take 1 capsule (250 mg total) by mouth as needed. 10/24/16   Zara Council A, PA-C  cholecalciferol (VITAMIN D) 1000 UNITS tablet Take 1,000 Units by mouth daily.    [provider]  CRANBERRY PO Take by mouth.    [provider]  diclofenac sodium (VOLTAREN) 1 % GEL Apply 4 g topically 4 (four) times daily. 04/02/16   Burnard Hawthorne, FNP  ibuprofen (ADVIL,MOTRIN) 200 MG tablet Take 600 mg by mouth 2 (two) times daily. prn    [provider]  letrozole (FEMARA) 2.5 MG tablet TAKE ONE TABLET BY MOUTH ONCE DAILY 07/14/14   Jackolyn Confer, MD  meclizine (ANTIVERT) 25 MG tablet Take 1 tablet (25 mg total) by mouth 3 (three) times daily as needed. 02/03/17   Burnard Hawthorne, FNP  naproxen sodium (ANAPROX) 220 MG tablet Take by mouth.    [provider]  ondansetron (ZOFRAN) 4 MG tablet Take 1 tablet (4 mg total) by mouth every 8 (eight) hours as needed for nausea or vomiting. 07/25/18   Nylah Butkus B, FNP  simvastatin (ZOCOR) 20 MG tablet TAKE 1 TABLET BY MOUTH IN THE  EVENING 03/02/18   Burnard Hawthorne, FNP    Allergies Ciprofloxacin; Ofloxacin; Quinolones; Actonel [risedronate]; Augmentin [amoxicillin-pot clavulanate]; and Bactrim [sulfamethoxazole-trimethoprim]  Family History  Problem Relation Age of Onset  . Osteoporosis Mother   . Hypertension Mother   . Osteoarthritis Mother   . Osteoporosis Sister   . Osteoarthritis Sister   . Osteoarthritis Maternal Grandmother   . Diabetes Maternal Grandfather   . Cancer Paternal Grandfather        unknown  . Prostate cancer Neg Hx   . Bladder Cancer Neg Hx   .  Kidney cancer Neg Hx     Social History Social History   Tobacco Use  . Smoking status: Never Smoker  . Smokeless tobacco: Never Used  Substance Use Topics  . Alcohol use: Yes    Alcohol/week: 0.0 standard drinks    Comment: rare  . Drug use: No    Review of Systems Constitutional: Negative for fever. Cardiovascular: Negative for chest pain. Respiratory: Negative for shortness of breath. Musculoskeletal: Positive for right wrist pain. Skin: Negative for open wound or lesion. Neurological: Negative for decrease in sensation  ____________________________________________   PHYSICAL EXAM:  VITAL SIGNS: ED Triage Vitals  Enc Vitals Group     BP 07/25/18 1416 139/66     Pulse Rate 07/25/18 1416 71     Resp 07/25/18 1416 18     Temp 07/25/18 1416 98.2 F (36.8 C)     Temp Source 07/25/18 1416 Oral     SpO2 07/25/18 1416 97 %     Weight 07/25/18 1418 179 lb (81.2 kg)     Height 07/25/18 1418 5\' 11"  (1.803 m)     Head Circumference --      Peak Flow --      Pain Score 07/25/18 1418 7     Pain Loc --      Pain Edu? --      Excl. in [place]? --     Constitutional: Alert and oriented. Well appearing and in no acute distress. Eyes: Conjunctivae are clear without discharge or drainage Head: Atraumatic Neck: Supple.  No focal midline tenderness. Respiratory: No cough. Respirations are even and unlabored. Musculoskeletal: Deformity of the distal radius is noted without associated open wound. Neurologic: Motor and sensory function of the right hand is intact. Skin: Early ecchymosis is noted over the dorsal aspect of the right hand and distal radius. Psychiatric: Affect and behavior are appropriate.  ____________________________________________   LABS (all labs ordered are listed, but only abnormal results are displayed)  Labs Reviewed - No data to display ____________________________________________  RADIOLOGY  Comminuted distal radial fracture with extension into the  articular  surface.   ____________________________________________   PROCEDURES  .Splint Application Date/Time: 5/63/1497 12:11 AM Performed by: Victorino Dike, FNP Authorized by: Victorino Dike, FNP   Consent:    Consent obtained:  Verbal   Consent given by:  Patient   Risks discussed:  Numbness and pain Pre-procedure details:    Sensation:  Normal Procedure details:    Laterality:  Right   Location:  Wrist   Splint type:  Sugar tong   Supplies:  Cotton padding, elastic bandage, Ortho-Glass and sling Post-procedure details:    Pain:  Unchanged   Sensation:  Normal   Patient tolerance of procedure:  Tolerated well, no immediate complications    ____________________________________________   INITIAL IMPRESSION / ASSESSMENT AND PLAN / ED COURSE  Airiana Elman is a 68 y.o. who presents to the  emergency department for treatment and evaluation after a mechanical, nonsyncopal fall at her home prior to arrival.  Image and exam are consistent with a comminuted distal radius fracture.  She was placed in a splint as described above.   Patient instructed to follow-up with orthopedics as soon as possible and was provided the number to [facility].  She was also instructed to return to the emergency department for symptoms that change or worsen if unable schedule an appointment with orthopedics or primary care.  Medications - No data to display  Pertinent labs & imaging results that were available during my care of the patient were reviewed by me and considered in my medical decision making (see chart for details).  _________________________________________   FINAL CLINICAL IMPRESSION(S) / ED DIAGNOSES  Final diagnoses:  Closed Colles' fracture of right radius, initial encounter    ED Discharge Orders         Ordered    ondansetron (ZOFRAN) 4 MG tablet  Every 8 hours PRN     07/25/18 1536    acetaminophen-codeine (TYLENOL #3) 300-30 MG tablet  Every 4 hours PRN      07/25/18 1536           If controlled substance prescribed during this visit, 12 month history viewed on the [place] prior to issuing an initial prescription for Schedule II or III opiod.    Victorino Dike, FNP 07/28/18 0013    Merlyn Lot, MD 07/30/18 [phone]

## 2018-07-25 NOTE — Discharge Instructions (Signed)
Please call tomorrow to schedule an appointment with orthopedics.  Return to the ER for symptoms of concern if unable to see primary care or orthopedics.

## 2018-07-25 NOTE — ED Notes (Signed)
Topaz pad not working in room. Pt's husband signed physical copy of discharge form.

## 2018-07-25 NOTE — ED Triage Notes (Signed)
Pt to ED with c/o of right wrist pain after falling.

## 2018-07-27 DIAGNOSIS — S52531A Colles' fracture of right radius, initial encounter for closed fracture: Secondary | ICD-10-CM | POA: Diagnosis not present

## 2018-08-02 DIAGNOSIS — S52531A Colles' fracture of right radius, initial encounter for closed fracture: Secondary | ICD-10-CM | POA: Diagnosis not present

## 2018-08-12 DIAGNOSIS — S52531A Colles' fracture of right radius, initial encounter for closed fracture: Secondary | ICD-10-CM | POA: Diagnosis not present

## 2018-08-19 ENCOUNTER — Ambulatory Visit (INDEPENDENT_AMBULATORY_CARE_PROVIDER_SITE_OTHER): Payer: Medicare Other

## 2018-08-19 VITALS — BP 118/64 | HR 70 | Temp 98.0°F | Resp 16 | Ht 71.5 in | Wt 185.8 lb

## 2018-08-19 DIAGNOSIS — Z0000 Encounter for general adult medical examination without abnormal findings: Secondary | ICD-10-CM

## 2018-08-19 NOTE — Progress Notes (Signed)
Subjective:   Deborah Mcconnell is a 67 y.o. female who presents for Medicare Annual (Subsequent) preventive examination.  Review of Systems:  No ROS.  Medicare Wellness Visit. Additional risk factors are reflected in the social history. Cardiac Risk Factors include: advanced age (>64mn, >>15women)     Objective:     Vitals: BP 118/64 (BP Location: Left Wrist, Patient Position: Sitting, Cuff Size: Normal)   Pulse 70   Temp 98 F (36.7 C) (Oral)   Resp 16   Ht 5' 11.5" (1.816 m)   Wt 185 lb 12.8 oz (84.3 kg)   SpO2 99%   BMI 25.55 kg/m   Body mass index is 25.55 kg/m.  Advanced Directives 08/19/2018 07/25/2018 08/18/2017 08/15/2016 08/04/2016 08/28/2015 12/21/2014  Does Patient Have a Medical Advance Directive? Yes No Yes Yes Yes Yes Yes  Type of AParamedicof ANorth VacherieLiving will - HTwin BridgesLiving will HMex[place]Living will [place]Living will Living will;Healthcare Power of [place]Living will;Mental Health Advance Directive;Advance instruction for mental health treatment  Does patient want to make changes to medical advance directive? No - Patient declined - No - Patient declined No - Patient declined No - Patient declined No - Patient declined No - Patient declined  Copy of [place] Chart? No - copy requested - No - copy requested No - copy requested - - Yes  Would patient like information on creating a medical advance directive? - No - Patient declined - - - - -    Tobacco Social History   Tobacco Use  Smoking Status Never Smoker  Smokeless Tobacco Never Used     Counseling given: Not Answered   Clinical Intake:  Pre-visit preparation completed: Yes        Diabetes: No  How often do you need to have someone help you when you read instructions, pamphlets, or other written materials from your doctor or pharmacy?: 1 -  Never  Interpreter Needed?: No     Past Medical History:  Diagnosis Date  . Arthritis    osteoarthritis  . [place]   Hx Lumpectomy and Rad tx's.  .Marland KitchenHyperlipidemia   . Osteopenia    previously taking Evista and Actonel  . Osteoporosis   . Vertigo    Past Surgical History:  Procedure Laterality Date  . CHOLECYSTECTOMY    . lt breast lumpectomy  2014   Family History  Problem Relation Age of Onset  . Osteoporosis Mother   . Hypertension Mother   . Osteoarthritis Mother   . Osteoporosis Sister   . Osteoarthritis Sister   . Osteoarthritis Maternal Grandmother   . Diabetes Maternal Grandfather   . Cancer Paternal Grandfather        unknown  . Prostate cancer Neg Hx   . Bladder Cancer Neg Hx   . Kidney cancer Neg Hx    Social History   Socioeconomic History  . Marital status: Married    Spouse name: Not on file  . Number of children: Not on file  . Years of education: Not on file  . Highest education level: Not on file  Occupational History  . Not on file  Social Needs  . Financial resource strain: Not on file  . Food insecurity:    Worry: Never true    Inability: Never true  . Transportation needs:    Medical: Not on file  Non-medical: Not on file  Tobacco Use  . Smoking status: Never Smoker  . Smokeless tobacco: Never Used  Substance and Sexual Activity  . Alcohol use: Yes    Alcohol/week: 0.0 standard drinks    Comment: rare  . Drug use: No  . Sexual activity: Yes  Lifestyle  . Physical activity:    Days per week: 3 days    Minutes per session: 30 min  . Stress: Not at all  Relationships  . Social connections:    Talks on phone: Not on file    Gets together: Not on file    Attends religious service: Not on file    Active member of club or organization: Not on file    Attends meetings of clubs or organizations: Not on file    Relationship status: Not on file  Other Topics Concern  . Not on file  Social History  Narrative   'lindy'      Works at [employer] in [place].       Married.       Exercise- not excercising.           Outpatient Encounter Medications as of 08/19/2018  Medication Sig  . Ascorbic Acid (VITAMIN C) 1000 MG tablet Take 1,000 mg by mouth daily.  . B Complex Vitamins (VITAMIN B COMPLEX PO) Take by mouth.  . calcium carbonate (CALCIUM 600) 600 MG TABS tablet Take by mouth. Take 1,200 mg by mouth.  . cephALEXin (KEFLEX) 250 MG capsule Take 1 capsule (250 mg total) by mouth as needed.  . cholecalciferol (VITAMIN D) 1000 UNITS tablet Take 1,000 Units by mouth daily.  Marland Kitchen CRANBERRY PO Take by mouth.  . diclofenac sodium (VOLTAREN) 1 % GEL Apply 4 g topically 4 (four) times daily.  Marland Kitchen ibuprofen (ADVIL,MOTRIN) 200 MG tablet Take 600 mg by mouth 2 (two) times daily. prn  . letrozole (FEMARA) 2.5 MG tablet TAKE ONE TABLET BY MOUTH ONCE DAILY  . meclizine (ANTIVERT) 25 MG tablet Take 1 tablet (25 mg total) by mouth 3 (three) times daily as needed.  . naproxen sodium (ANAPROX) 220 MG tablet Take by mouth.  . ondansetron (ZOFRAN) 4 MG tablet Take 1 tablet (4 mg total) by mouth every 8 (eight) hours as needed for nausea or vomiting.  . simvastatin (ZOCOR) 20 MG tablet TAKE 1 TABLET BY MOUTH IN THE EVENING   No facility-administered encounter medications on file as of 08/19/2018.     Activities of Daily Living In your present state of health, do you have any difficulty performing the following activities: 08/19/2018  Hearing? N  Vision? N  Difficulty concentrating or making decisions? N  Walking or climbing stairs? N  Dressing or bathing? N  Doing errands, shopping? N  Preparing Food and eating ? N  Using the Toilet? N  In the past six months, have you accidently leaked urine? N  Do you have problems with loss of bowel control? N  Managing your Medications? N  Managing your Finances? N  Housekeeping or managing your Housekeeping? N  Some recent data might be hidden     Patient Care Team: Burnard Hawthorne, FNP as PCP - General (Family Medicine)    Assessment:   This is a routine wellness examination for [place].  Health Screenings  Mammogram -11/10/14 Colonoscopy -07/24/14 Bone Density -09/04/11 Glaucoma -none Hearing -demonstrates normal hearing during conversation Hemoglobin A1C -09/21/17 (5.6) Cholesterol -09/21/17 (144) Dental-every 6 months Vision-every 12 months  Social  Alcohol intake no  Smoking history- none Smokers in home? none Illicit drug use? none Exercise -walking, 3 days weekly 30 minutes Diet -lean protein Sexually Active -yes Multiple Partners -no  Safety  Patient feels safe at home.  Patient does have smoke detectors at home  Patient does wear sunscreen or protective clothing when in direct sunlight. Patient does wear seat belt when driving or riding with others.   Activities of Daily Living Patient can do their own household chores. Denies needing assistance with: driving, feeding themselves, getting from bed to chair, getting to the toilet, bathing/showering, dressing, managing money, climbing flight of stairs, or preparing meals.   Depression Screen Patient denies losing interest in daily life, feeling hopeless, or crying easily over simple problems.   Fall Screen Patient denies being afraid of falling or falling in the last year.   Memory Screen Patient denies problems with memory, misplacing items, and is able to balance checkbook/bank accounts.  Patient is alert, normal appearance, oriented to person/place/and time. Correctly identified the president of the [place], recall of 3/3 objects, and performing simple calculations.  Patient displays appropriate judgement and can read correct time from watch face.   Immunizations The following Immunizations are up to date: Influenza, shingles, pneumonia, and tetanus.   Other Providers Patient Care Team: Burnard Hawthorne, FNP as PCP - General (Family  Medicine)  Exercise Activities and Dietary recommendations Current Exercise Habits: Home exercise routine, Type of exercise: walking, Time (Minutes): 30, Frequency (Times/Week): 3, Weekly Exercise (Minutes/Week): 90, Intensity: Moderate  Goals      Patient Stated   . Increase physical activity (pt-stated)     Leg strengthening exercises, as demonstrated with reps Tai-Chi with Silver Sneaker/gym program       Fall Risk Fall Risk  08/19/2018 08/18/2017 02/03/2017 08/15/2016 07/29/2016  Falls in the past year? 1 No No No No  Number falls in past yr: 0 - - - -  Injury with Fall? 1 - - - -  Comment Followed by ortho and pcp. Broken R wrist. She fell from a ladder. 3 weeks ago.  - - - -   Depression Screen PHQ 2/9 Scores 08/19/2018 08/18/2017 02/03/2017 08/15/2016  PHQ - 2 Score 0 0 0 0     Cognitive Function MMSE - Mini Mental State Exam 08/19/2018 08/18/2017 08/15/2016  Orientation to time 5 5 5   Orientation to Place 5 5 5   Registration 3 3 3   Attention/ Calculation 5 5 5   Recall 3 3 3   Language- name 2 objects 2 2 2   Language- repeat 1 1 1   Language- follow 3 step command 3 3 3   Language- read & follow direction 1 1 1   Write a sentence 1 1 1   Copy design 1 1 1   Total score 30 30 30      6CIT Screen 08/15/2016  What Year? 0 points  What month? 0 points  What time? 0 points  Count back from 20 0 points  Months in reverse 0 points  Repeat phrase 0 points  Total Score 0    Immunization History  Administered Date(s) Administered  . Influenza Whole 04/13/2013  . Influenza, High Dose Seasonal PF 02/27/2016, 04/17/2017  . Influenza,inj,Quad PF,6+ Mos 06/13/2015  . Influenza-Unspecified 04/03/2014  . Pneumococcal Conjugate-13 09/10/2015  . Pneumococcal Polysaccharide-23 04/28/2012, 08/18/2017  . Tdap 10/05/2014  . Zoster 05/08/2014   Screening Tests Health Maintenance  Topic Date Due  . MAMMOGRAM  03/05/2019  . COLONOSCOPY  07/24/2024  . TETANUS/TDAP  10/04/2024  . INFLUENZA  VACCINE  Completed  . DEXA SCAN  Completed  . Hepatitis C Screening  Completed  . PNA vac Low Risk Adult  Completed      Plan:    End of life planning; Advance aging; Advanced directives discussed. Copy of current HCPOA/Living Will requested.    I have personally reviewed and noted the following in the patient's chart:   . Medical and social history . Use of alcohol, tobacco or illicit drugs  . Current medications and supplements . Functional ability and status . Nutritional status . Physical activity . Advanced directives . List of other physicians . Hospitalizations, surgeries, and ER visits in previous 12 months . Vitals . Screenings to include cognitive, depression, and falls . Referrals and appointments  In addition, I have reviewed and discussed with patient certain preventive protocols, quality metrics, and best practice recommendations. A written personalized care plan for preventive services as well as general preventive health recommendations were provided to patient.     Varney Biles, LPN  7/34/1937

## 2018-08-19 NOTE — Progress Notes (Signed)
Agree with note   F/u PCP   [place]

## 2018-08-19 NOTE — Patient Instructions (Addendum)
  Deborah Mcconnell , Thank you for taking time to come for your Medicare Wellness Visit. I appreciate your ongoing commitment to your health goals. Please review the following plan we discussed and let me know if I can assist you in the future.   These are the goals we discussed: Goals      Patient Stated   . Increase physical activity (pt-stated)     Leg strengthening exercises, as demonstrated with reps Tai-Chi with Silver Sneaker/gym program       This is a list of the screening recommended for you and due dates:  Health Maintenance  Topic Date Due  . Mammogram  03/05/2019  . Colon Cancer Screening  07/24/2024  . Tetanus Vaccine  10/04/2024  . Flu Shot  Completed  . DEXA scan (bone density measurement)  Completed  .  Hepatitis C: One time screening is recommended by Center for Disease Control  (CDC) for  adults born from [birth year] through 1965.   Completed  . Pneumonia vaccines  Completed

## 2018-08-27 ENCOUNTER — Encounter: Payer: Self-pay | Admitting: Family

## 2018-08-27 ENCOUNTER — Ambulatory Visit (INDEPENDENT_AMBULATORY_CARE_PROVIDER_SITE_OTHER): Payer: Medicare Other | Admitting: Family

## 2018-08-27 VITALS — BP 102/70 | HR 72 | Temp 97.8°F | Wt 185.8 lb

## 2018-08-27 DIAGNOSIS — E782 Mixed hyperlipidemia: Secondary | ICD-10-CM

## 2018-08-27 DIAGNOSIS — E559 Vitamin D deficiency, unspecified: Secondary | ICD-10-CM | POA: Diagnosis not present

## 2018-08-27 DIAGNOSIS — M25531 Pain in right wrist: Secondary | ICD-10-CM | POA: Insufficient documentation

## 2018-08-27 LAB — COMPREHENSIVE METABOLIC PANEL
ALBUMIN: 4.2 g/dL (ref 3.5–5.2)
ALT: 47 U/L — ABNORMAL HIGH (ref 0–35)
AST: 33 U/L (ref 0–37)
Alkaline Phosphatase: 65 U/L (ref 39–117)
BUN: 16 mg/dL (ref 6–23)
CO2: 26 mEq/L (ref 19–32)
Calcium: 9.8 mg/dL (ref 8.4–10.5)
Chloride: 105 mEq/L (ref 96–112)
Creatinine, Ser: 0.7 mg/dL (ref 0.40–1.20)
GFR: 83.21 mL/min (ref >60.00)
Glucose, Bld: 91 mg/dL (ref 70–99)
Potassium: 4 mEq/L (ref 3.5–5.1)
Sodium: 140 mEq/L (ref 135–145)
Total Bilirubin: 0.5 mg/dL (ref 0.2–1.2)
Total Protein: 7 g/dL (ref 6.0–8.3)

## 2018-08-27 LAB — VITAMIN D 25 HYDROXY (VIT D DEFICIENCY, FRACTURES): VITD: 49.34 ng/mL (ref 30.00–100.00)

## 2018-08-27 NOTE — Patient Instructions (Addendum)
So nice to see you!  May shingrex vaccine when you are feeling better  Stop potassium, magnesium.   For post menopausal women, guidelines recommend a diet with 1200 mg of Calcium per day. If you are eating calcium rich foods, you do not need a calcium supplement. The body better absorbs the calcium that you eat over supplementation. If you do supplement, I recommend not supplementing the full 1200 mg/ day as this can lead to increased risk of cardiovascular disease. I recommend Calcium Citrate over the counter, and you may take a total of 600 to 800 mg per day in divided doses with meals for best absorption.   For bone health, you need adequate vitamin D, and I recommend you supplement as it is harder to do so with diet alone. I recommend cholecalciferol 800 units daily.  Also, please ensure you are following a diet high in calcium -- research shows better outcomes with dietary sources including kale, yogurt, broccolii, cheese, okra, almonds- to name a few.     Also remember that exercise is a great medicine for maintain and preserve bone health. Advise moderate exercise for 30 minutes , 3 times per week.

## 2018-08-27 NOTE — Assessment & Plan Note (Signed)
Doing well on medication, will continue.

## 2018-08-27 NOTE — Assessment & Plan Note (Signed)
Pending labs; education provided on placement of also recommendations vitamin D, calcium

## 2018-08-27 NOTE — Assessment & Plan Note (Signed)
S/p fracture. She is following with Dr Sabra Heck. Will follow.

## 2018-08-27 NOTE — Progress Notes (Signed)
Subjective:    Patient ID: Deborah Mcconnell, female    DOB: [birth year], 68 y.o.   MRN: [id number]  CC: Deborah Mcconnell is a 68 y.o. female who presents today for follow up.   HPI: Doing well . No new complaints today. No cp.   Wearing right wrist cast after falling off ladder a  Month ago. Pain and swelling improved. Following with Dr Sabra Heck for right wrist fracture.  HLD- doing well on zocor.   Retired now.   Taking magnesium and potassium for bone healing  Has been on vitamin D 1000 IU.     UTD mammogram.     HISTORY:  Past Medical History:  Diagnosis Date  . Arthritis    osteoarthritis  . Breast cancer, left ([place]) 2014   Hx Lumpectomy and Rad tx's.  Marland Kitchen Hyperlipidemia   . Osteopenia    previously taking Evista and Actonel  . Osteoporosis   . Vertigo    Past Surgical History:  Procedure Laterality Date  . CHOLECYSTECTOMY    . lt breast lumpectomy  2014   Family History  Problem Relation Age of Onset  . Osteoporosis Mother   . Hypertension Mother   . Osteoarthritis Mother   . Osteoporosis Sister   . Osteoarthritis Sister   . Osteoarthritis Maternal Grandmother   . Diabetes Maternal Grandfather   . Cancer Paternal Grandfather        unknown  . Prostate cancer Neg Hx   . Bladder Cancer Neg Hx   . Kidney cancer Neg Hx     Allergies: Ciprofloxacin; Ofloxacin; Quinolones; Actonel [risedronate]; Augmentin [amoxicillin-pot clavulanate]; and Bactrim [sulfamethoxazole-trimethoprim] Current Outpatient Medications on File Prior to Visit  Medication Sig Dispense Refill  . Ascorbic Acid (VITAMIN C) 1000 MG tablet Take 1,000 mg by mouth daily.    . B Complex Vitamins (VITAMIN B COMPLEX PO) Take by mouth.    . calcium carbonate (CALCIUM 600) 600 MG TABS tablet Take by mouth. Take 1,200 mg by mouth.    . cephALEXin (KEFLEX) 250 MG capsule Take 1 capsule (250 mg total) by mouth as needed. 90 capsule 0  . cholecalciferol (VITAMIN D) 1000 UNITS tablet Take 1,000  Units by mouth daily.    Marland Kitchen CRANBERRY PO Take by mouth.    . diclofenac sodium (VOLTAREN) 1 % GEL Apply 4 g topically 4 (four) times daily. 1 Tube 3  . ibuprofen (ADVIL,MOTRIN) 200 MG tablet Take 600 mg by mouth 2 (two) times daily. prn    . letrozole (FEMARA) 2.5 MG tablet TAKE ONE TABLET BY MOUTH ONCE DAILY 30 tablet 0  . Magnesium 250 MG TABS Take 1 tablet by mouth daily.    . meclizine (ANTIVERT) 25 MG tablet Take 1 tablet (25 mg total) by mouth 3 (three) times daily as needed. 90 tablet 0  . ondansetron (ZOFRAN) 4 MG tablet Take 1 tablet (4 mg total) by mouth every 8 (eight) hours as needed for nausea or vomiting. 20 tablet 0  . Potassium 99 MG TABS Take 1 tablet by mouth daily.    . simvastatin (ZOCOR) 20 MG tablet TAKE 1 TABLET BY MOUTH IN THE EVENING 90 tablet 1   No current facility-administered medications on file prior to visit.     Social History   Tobacco Use  . Smoking status: Never Smoker  . Smokeless tobacco: Never Used  Substance Use Topics  . Alcohol use: Yes    Alcohol/week: 0.0 standard drinks    Comment: rare  .  Drug use: No    Review of Systems  Constitutional: Negative for chills and fever.  Respiratory: Negative for cough.   Cardiovascular: Negative for chest pain and palpitations.  Gastrointestinal: Negative for nausea and vomiting.  Musculoskeletal: Positive for arthralgias.      Objective:    BP 102/70 (BP Location: Left Wrist, Patient Position: Sitting, Cuff Size: Normal)   Pulse 72   Temp 97.8 F (36.6 C)   Wt 185 lb 12.8 oz (84.3 kg)   SpO2 96%   BMI 25.55 kg/m  BP Readings from Last 3 Encounters:  08/27/18 102/70  08/19/18 118/64  07/25/18 139/66   Wt Readings from Last 3 Encounters:  08/27/18 185 lb 12.8 oz (84.3 kg)  08/19/18 185 lb 12.8 oz (84.3 kg)  07/25/18 179 lb (81.2 kg)    Physical Exam Vitals signs reviewed.  Constitutional:      Appearance: She is well-developed.  Eyes:     Conjunctiva/sclera: Conjunctivae normal.    Cardiovascular:     Rate and Rhythm: Normal rate and regular rhythm.     Pulses: Normal pulses.     Heart sounds: Normal heart sounds.  Pulmonary:     Effort: Pulmonary effort is normal.     Breath sounds: Normal breath sounds. No wheezing, rhonchi or rales.  Skin:    General: Skin is warm and dry.  Neurological:     Mental Status: She is alert.  Psychiatric:        Speech: Speech normal.        Behavior: Behavior normal.        Thought Content: Thought content normal.        Assessment & Plan:   Problem List Items Addressed This Visit      Other   Hyperlipidemia    Doing well on medication, will continue.       Right wrist pain    S/p fracture. She is following with Dr Sabra Heck. Will follow.       Vitamin D deficiency - Primary    Pending labs; education provided on placement of also recommendations vitamin D, calcium      Relevant Orders   VITAMIN D 25 Hydroxy (Vit-D Deficiency, Fractures)   Comprehensive metabolic panel       I have discontinued Deborah Mcconnell "[name]"'s naproxen sodium. I am also having her maintain her cholecalciferol, ibuprofen, calcium carbonate, letrozole, diclofenac sodium, vitamin C, CRANBERRY PO, B Complex Vitamins (VITAMIN B COMPLEX PO), cephALEXin, meclizine, simvastatin, ondansetron, Potassium, and Magnesium.   No orders of the defined types were placed in this encounter.   Return precautions given.   Risks, benefits, and alternatives of the medications and treatment plan prescribed today were discussed, and patient expressed understanding.   Education regarding symptom management and diagnosis given to patient on AVS.  Continue to follow with Burnard Hawthorne, FNP for routine health maintenance.   Deborah Mcconnell and I agreed with plan.   Mable Paris, FNP

## 2018-08-30 ENCOUNTER — Other Ambulatory Visit: Payer: Self-pay | Admitting: Family

## 2018-08-30 DIAGNOSIS — R899 Unspecified abnormal finding in specimens from other organs, systems and tissues: Secondary | ICD-10-CM

## 2018-08-31 ENCOUNTER — Other Ambulatory Visit (INDEPENDENT_AMBULATORY_CARE_PROVIDER_SITE_OTHER): Payer: Medicare Other

## 2018-08-31 DIAGNOSIS — R899 Unspecified abnormal finding in specimens from other organs, systems and tissues: Secondary | ICD-10-CM | POA: Diagnosis not present

## 2018-08-31 NOTE — Addendum Note (Signed)
Addended by: Arby Barrette on: 08/31/2018 11:18 AM   Modules accepted: Orders

## 2018-09-01 ENCOUNTER — Other Ambulatory Visit: Payer: Self-pay | Admitting: Family

## 2018-09-01 DIAGNOSIS — R748 Abnormal levels of other serum enzymes: Secondary | ICD-10-CM

## 2018-09-01 LAB — HEPATIC FUNCTION PANEL
AG Ratio: 1.7 (calc) (ref 1.0–2.5)
ALT: 30 U/L — ABNORMAL HIGH (ref 6–29)
AST: 27 U/L (ref 10–35)
Albumin: 4.1 g/dL (ref 3.6–5.1)
Alkaline phosphatase (APISO): 62 U/L (ref 37–153)
Bilirubin, Direct: 0.1 mg/dL (ref 0.0–0.2)
Globulin: 2.4 g/dL (calc) (ref 1.9–3.7)
Indirect Bilirubin: 0.3 mg/dL (calc) (ref 0.2–1.2)
Total Bilirubin: 0.4 mg/dL (ref 0.2–1.2)
Total Protein: 6.5 g/dL (ref 6.1–8.1)

## 2018-09-01 NOTE — Progress Notes (Signed)
he

## 2018-09-02 DIAGNOSIS — S52531A Colles' fracture of right radius, initial encounter for closed fracture: Secondary | ICD-10-CM | POA: Diagnosis not present

## 2018-09-08 ENCOUNTER — Other Ambulatory Visit: Payer: Self-pay | Admitting: Family

## 2018-09-08 DIAGNOSIS — E782 Mixed hyperlipidemia: Secondary | ICD-10-CM

## 2018-09-16 DIAGNOSIS — M79641 Pain in right hand: Secondary | ICD-10-CM | POA: Diagnosis not present

## 2018-09-23 DIAGNOSIS — S52531D Colles' fracture of right radius, subsequent encounter for closed fracture with routine healing: Secondary | ICD-10-CM | POA: Diagnosis not present

## 2018-09-28 DIAGNOSIS — S52531D Colles' fracture of right radius, subsequent encounter for closed fracture with routine healing: Secondary | ICD-10-CM | POA: Diagnosis not present

## 2018-09-30 DIAGNOSIS — S52531D Colles' fracture of right radius, subsequent encounter for closed fracture with routine healing: Secondary | ICD-10-CM | POA: Diagnosis not present

## 2018-10-01 ENCOUNTER — Other Ambulatory Visit (INDEPENDENT_AMBULATORY_CARE_PROVIDER_SITE_OTHER): Payer: Medicare Other

## 2018-10-01 ENCOUNTER — Other Ambulatory Visit: Payer: Self-pay

## 2018-10-01 DIAGNOSIS — R748 Abnormal levels of other serum enzymes: Secondary | ICD-10-CM | POA: Diagnosis not present

## 2018-10-01 LAB — HEPATIC FUNCTION PANEL
ALT: 15 U/L (ref 0–35)
AST: 18 U/L (ref 0–37)
Albumin: 4.1 g/dL (ref 3.5–5.2)
Alkaline Phosphatase: 53 U/L (ref 39–117)
Bilirubin, Direct: 0.1 mg/dL (ref 0.0–0.3)
Total Bilirubin: 0.5 mg/dL (ref 0.2–1.2)
Total Protein: 6.8 g/dL (ref 6.0–8.3)

## 2018-10-05 DIAGNOSIS — S52531D Colles' fracture of right radius, subsequent encounter for closed fracture with routine healing: Secondary | ICD-10-CM | POA: Diagnosis not present

## 2018-10-06 DIAGNOSIS — M25531 Pain in right wrist: Secondary | ICD-10-CM | POA: Diagnosis not present

## 2018-10-07 DIAGNOSIS — S52531D Colles' fracture of right radius, subsequent encounter for closed fracture with routine healing: Secondary | ICD-10-CM | POA: Diagnosis not present

## 2018-10-08 DIAGNOSIS — S52531D Colles' fracture of right radius, subsequent encounter for closed fracture with routine healing: Secondary | ICD-10-CM | POA: Diagnosis not present

## 2018-10-14 DIAGNOSIS — S52531D Colles' fracture of right radius, subsequent encounter for closed fracture with routine healing: Secondary | ICD-10-CM | POA: Diagnosis not present

## 2018-10-20 DIAGNOSIS — S52531D Colles' fracture of right radius, subsequent encounter for closed fracture with routine healing: Secondary | ICD-10-CM | POA: Diagnosis not present

## 2018-11-10 DIAGNOSIS — S52531D Colles' fracture of right radius, subsequent encounter for closed fracture with routine healing: Secondary | ICD-10-CM | POA: Diagnosis not present

## 2018-11-29 ENCOUNTER — Telehealth: Payer: Self-pay | Admitting: Urology

## 2018-11-29 ENCOUNTER — Telehealth (INDEPENDENT_AMBULATORY_CARE_PROVIDER_SITE_OTHER): Payer: Medicare Other | Admitting: Urology

## 2018-11-29 ENCOUNTER — Other Ambulatory Visit: Payer: Self-pay

## 2018-11-29 DIAGNOSIS — Z87448 Personal history of other diseases of urinary system: Secondary | ICD-10-CM

## 2018-11-29 DIAGNOSIS — Z87440 Personal history of urinary (tract) infections: Secondary | ICD-10-CM

## 2018-11-29 DIAGNOSIS — N952 Postmenopausal atrophic vaginitis: Secondary | ICD-10-CM

## 2018-11-29 NOTE — Telephone Encounter (Signed)
Would you call Deborah Mcconnell and schedule her for a one year follow up for symptoms recheck?

## 2018-11-29 NOTE — Progress Notes (Signed)
Virtual Visit via Video Note  I connected with Deborah Mcconnell on 11/29/18 at  9:30 AM EDT by a video enabled telemedicine application and verified that I am speaking with the correct person using two identifiers.  Location: Patient: Home Provider: Home   I discussed the limitations of evaluation and management by telemedicine and the availability of in person appointments. The patient expressed understanding and agreed to proceed.  History of Present Illness: Deborah Mcconnell is a 68 year old Caucasian female with a history of rUTI's, vaginal atrophy and a history of hematuria is contacted via [email or web address] for her yearly office visit due to the COVID-19 pandemic.    History of rUTI's - no reports/documented UTI's over the last year, taking cranberry tablets and post coital Keflex.  Her last refill on the Keflex was in 2018.   Patient denies any gross hematuria, dysuria or suprapubic/flank pain.  Patient denies any fevers, chills, nausea or vomiting.   Vaginal atrophy - history of breast cancer, not currently on vaginal estrogen cream, no complaints of vaginal burning or itching  History of hematuria - several hematuria work ups with no + findings, no reports of gross hematuria    Observations/Objective: Deborah Mcconnell is dressed appropriately and well groomed.  She is answering questions appropriately.    Assessment and Plan:  1. History of rUTI's Doing well  No need for refill on Keflex  2. History of hematuria No reports of hematuria  3. Vaginal atrophy No complaints at this time  Follow Up Instructions:  Deborah Mcconnell will follow up in one year for symptoms recheck.    I discussed the assessment and treatment plan with the patient. The patient was provided an opportunity to ask questions and all were answered. The patient agreed with the plan and demonstrated an understanding of the instructions.   The patient was advised to call back or seek an in-person evaluation if the  symptoms worsen or if the condition fails to improve as anticipated.  I provided 5 minutes of non-face-to-face time during this encounter.   Dquan Cortopassi, PA-C

## 2018-11-29 NOTE — Telephone Encounter (Signed)
App made and mailed to patient 

## 2018-11-30 ENCOUNTER — Ambulatory Visit: Payer: Medicare Other | Admitting: Urology

## 2018-12-06 ENCOUNTER — Other Ambulatory Visit: Payer: Self-pay

## 2018-12-06 ENCOUNTER — Ambulatory Visit (INDEPENDENT_AMBULATORY_CARE_PROVIDER_SITE_OTHER): Payer: Medicare Other | Admitting: Family Medicine

## 2018-12-06 VITALS — BP 132/78 | HR 77 | Temp 98.3°F | Resp 16 | Ht 71.0 in | Wt 181.4 lb

## 2018-12-06 DIAGNOSIS — E782 Mixed hyperlipidemia: Secondary | ICD-10-CM | POA: Diagnosis not present

## 2018-12-06 DIAGNOSIS — M542 Cervicalgia: Secondary | ICD-10-CM

## 2018-12-06 DIAGNOSIS — H9209 Otalgia, unspecified ear: Secondary | ICD-10-CM

## 2018-12-06 DIAGNOSIS — R42 Dizziness and giddiness: Secondary | ICD-10-CM

## 2018-12-06 MED ORDER — SIMVASTATIN 20 MG PO TABS: 20.0000 mg | ORAL_TABLET | Freq: Every evening | ORAL | 3 refills | Status: DC

## 2018-12-06 NOTE — Progress Notes (Signed)
Subjective:    Patient ID: Deborah Mcconnell, female    DOB: [birth year], 68 y.o.   MRN: [id number]  HPI   Patient presents to clinic complaining of right earache.  Patient has struggled with right earache off and on for years.  This ear ache feels clogged and she also is having right-sided neck pain.  Not sure if the neck pain or earache are interconnected or if there are 2 separate issues.  Patient also does have a history of seasonal allergies and vertigo, currently takes fexofenadine and Flonase daily to help combat seasonal allergies.  Patient states when she was undergoing treatments for breast cancer, had full work-up for Mnire's disease due to episodes of vertigo and ear pain and this work-up was negative for Mnire's.  Patient does not believe she is ever seen ear nose throat for evaluation of ear pain and or vertigo.  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  Patient tries to do some range of motion exercises to help the neck pain with some success and also does use a heating pad.  Also needs simvastatin refilll  Patient Active Problem List   Diagnosis Date Noted  . Right wrist pain 08/27/2018  . Vitamin D deficiency 08/27/2018  . Paroxysmal tachycardia ([place]) 12/28/2017  . Palpitations 09/11/2017  . Bronchitis 09/11/2017  . Dysuria 02/03/2017  . Right ear pain 02/03/2017  . Right hip pain 07/29/2016  . Osteoarthritis 04/02/2016  . Screening for cervical cancer 10/08/2015  . Pelvic pain in female 09/10/2015  . Midline thoracic back pain 08/15/2015  . Microscopic hematuria 05/08/2015  . Vertigo 10/27/2014  . Malignant neoplasm of breast (female) ([place]) 11/19/2012  . Routine physical examination 10/27/2012  . Screening for colon cancer 10/27/2012  . Hyperlipidemia 02/17/2011  . Osteopenia 02/17/2011   Social History   Tobacco Use  . Smoking status: Never Smoker  . Smokeless tobacco: Never Used   Substance Use Topics  . Alcohol use: Yes    Alcohol/week: 0.0 standard drinks    Comment: rare   Review of Systems   Constitutional: Negative for chills, fatigue and fever.  HENT: Negative for congestion, sinus pain and sore throat. +right ear pain Eyes: Negative.   Respiratory: Negative for cough, shortness of breath and wheezing.   Cardiovascular: Negative for chest pain, palpitations and leg swelling.  Gastrointestinal: Negative for abdominal pain, diarrhea, nausea and vomiting.  Genitourinary: Negative for dysuria, frequency and urgency.  Musculoskeletal: +right sided neck pain Skin: Negative for color change, pallor and rash.  Neurological: Negative for syncope, light-headedness and headaches.  Psychiatric/Behavioral: The patient is not nervous/anxious.       Objective:   Physical Exam Vitals signs and nursing note reviewed.  Constitutional:      General: She is not in acute distress.    Appearance: She is not ill-appearing, toxic-appearing or diaphoretic.  HENT:     Head: Normocephalic and atraumatic.     Ears:     Comments: Faint pink color to both TMs, otherwise appear normal. Ear canals clear.  Eyes:     General: No scleral icterus.    Extraocular Movements: Extraocular movements intact.     Conjunctiva/sclera: Conjunctivae normal.  Neck:     Musculoskeletal: Normal range of motion. Muscular tenderness present. No erythema, neck rigidity, crepitus or torticollis.     Trachea: Trachea and phonation normal.      Comments: +pain of muscles right side  of neck Cardiovascular:     Rate and Rhythm: Normal rate and regular rhythm.     Heart sounds: Normal heart sounds.  Pulmonary:     Effort: Pulmonary effort is normal. No respiratory distress.     Breath sounds: Normal breath sounds.  Lymphadenopathy:     Cervical: No cervical adenopathy.  Neurological:     General: No focal deficit present.     Mental Status: She is alert and oriented to person, place, and time.      Cranial Nerves: No cranial nerve deficit.     Motor: No weakness.     Gait: Gait normal.  Psychiatric:        Mood and Affect: Mood normal.        Behavior: Behavior normal.        Thought Content: Thought content normal.        Judgment: Judgment normal.    Today's Vitals   12/06/18 1326  BP: 132/78  Pulse: 77  Resp: 16  Temp: 98.3 F (36.8 C)  TempSrc: Oral  SpO2: 95%  Weight: 181 lb 6.4 oz (82.3 kg)  Height: 5\' 11"  (1.803 m)   Body mass index is 25.3 kg/m.     Assessment & Plan:    Earache/vertigo-patient's right earache could be related to a combination of things including chronic seasonal allergies causing ear pain and also vertigo.  Patient advised to try switching the oral allergy medicine she takes every day to a different brand to see if this small change will make a difference.  Also advised to keep up good fluid intake and change positions slowly when going from laying to sitting to standing.  We will also put ENT referral in for further evaluation and to chronic ear pain.  Neck pain-patient given handout outlining neck exercises instruction to do every day.  Advised she can do topical rubs on sore area of neck like BenGay or Biofreeze as well as using a heating pad.  Discussed with patient that it is potentially possible that the neck pain and ear pain are 2 separate issues just happened to be happening on the same side of her body.  Hyperlipidemia-patient stable on current simvastatin dose and having no issues with this.  Refill sent into pharmacy.  Patient will keep regularly scheduled follow-up with PCP as planned and she will let us know if any of her symptoms worsen or do not improve.  Advised to call us if she does not hear anything about the ENT referral in the next 1 to 2 weeks.

## 2018-12-06 NOTE — Patient Instructions (Signed)
Try taking generic Claritin (loratadine) 10 mg daily in place of current allergy med   Neck Exercises Neck exercises can be important for many reasons:  They can help you to improve and maintain flexibility in your neck. This can be especially important as you age.  They can help to make your neck stronger. This can make movement easier.  They can reduce or prevent neck pain.  They may help your upper back. Ask your health care provider which neck exercises would be best for you. Exercises to improve neck flexibility Neck stretch Repeat this exercise 3-5 times. 1. Do this exercise while standing or while sitting in a chair. 2. Place your feet flat on the floor, shoulder-width apart. 3. Slowly turn your head to the right. Turn it all the way to the right so you can look over your right shoulder. Do not tilt or tip your head. 4. Hold this position for 10-30 seconds. 5. Slowly turn your head to the left, to look over your left shoulder. 6. Hold this position for 10-30 seconds.  Neck retraction Repeat this exercise 8-10 times. Do this 3-4 times a day or as told by your health care provider. 1. Do this exercise while standing or while sitting in a sturdy chair. 2. Look straight ahead. Do not bend your neck. 3. Use your fingers to push your chin backward. Do not bend your neck for this movement. Continue to face straight ahead. If you are doing the exercise properly, you will feel a slight sensation in your throat and a stretch at the back of your neck. 4. Hold the stretch for 1-2 seconds. Relax and repeat. Exercises to improve neck strength Neck press Repeat this exercise 10 times. Do it first thing in the morning and right before bed or as told by your health care provider. 1. Lie on your back on a firm bed or on the floor with a pillow under your head. 2. Use your neck muscles to push your head down on the pillow and straighten your spine. 3. Hold the position as well as you can. Keep  your head facing up and your chin tucked. 4. Slowly count to 5 while holding this position. 5. Relax for a few seconds. Then repeat. Isometric strengthening Do a full set of these exercises 2 times a day or as told by your health care provider. 1. Sit in a supportive chair and place your hand on your forehead. 2. Push forward with your head and neck while pushing back with your hand. Hold for 10 seconds. 3. Relax. Then repeat the exercise 3 times. 4. Next, do thesequence again, this time putting your hand against the back of your head. Use your head and neck to push backward against the hand pressure. 5. Finally, do the same exercise on either side of your head, pushing sideways against the pressure of your hand. Prone head lifts Repeat this exercise 5 times. Do this 2 times a day or as told by your health care provider. 1. Lie face-down, resting on your elbows so that your chest and upper back are raised. 2. Start with your head facing downward, near your chest. Position your chin either on or near your chest. 3. Slowly lift your head upward. Lift until you are looking straight ahead. Then continue lifting your head as far back as you can stretch. 4. Hold your head up for 5 seconds. Then slowly lower it to your starting position. Supine head lifts Repeat this exercise 8-10 times.  Do this 2 times a day or as told by your health care provider. 1. Lie on your back, bending your knees to point to the ceiling and keeping your feet flat on the floor. 2. Lift your head slowly off the floor, raising your chin toward your chest. 3. Hold for 5 seconds. 4. Relax and repeat. Scapular retraction Repeat this exercise 5 times. Do this 2 times a day or as told by your health care provider. 1. Stand with your arms at your sides. Look straight ahead. 2. Slowly pull both shoulders backward and downward until you feel a stretch between your shoulder blades in your upper back. 3. Hold for 10-30 seconds. 4.  Relax and repeat. Contact a health care provider if:  Your neck pain or discomfort gets much worse when you do an exercise.  Your neck pain or discomfort does not improve within 2 hours after you exercise. If you have any of these problems, stop exercising right away. Do not do the exercises again unless your health care provider says that you can. Get help right away if:  You develop sudden, severe neck pain. If this happens, stop exercising right away. Do not do the exercises again unless your health care provider says that you can. This information is not intended to replace advice given to you by your health care provider. Make sure you discuss any questions you have with your health care provider. Document Released: 05/28/2015 Document Revised: 10/20/2017 Document Reviewed: 12/25/2014 Elsevier Interactive Patient Education  2019 [employer].  (loratadine) in place of current allergy med to see if this makes a difference

## 2018-12-13 DIAGNOSIS — H9209 Otalgia, unspecified ear: Secondary | ICD-10-CM | POA: Diagnosis not present

## 2018-12-13 DIAGNOSIS — R42 Dizziness and giddiness: Secondary | ICD-10-CM | POA: Diagnosis not present

## 2019-03-16 DIAGNOSIS — M8589 Other specified disorders of bone density and structure, multiple sites: Secondary | ICD-10-CM | POA: Diagnosis not present

## 2019-03-16 DIAGNOSIS — Z79811 Long term (current) use of aromatase inhibitors: Secondary | ICD-10-CM | POA: Diagnosis not present

## 2019-03-16 DIAGNOSIS — Z170 Estrogen receptor positive status [ER+]: Secondary | ICD-10-CM | POA: Diagnosis not present

## 2019-03-16 DIAGNOSIS — C50312 Malignant neoplasm of lower-inner quadrant of left female breast: Secondary | ICD-10-CM | POA: Diagnosis not present

## 2019-03-16 DIAGNOSIS — Z1231 Encounter for screening mammogram for malignant neoplasm of breast: Secondary | ICD-10-CM | POA: Diagnosis not present

## 2019-04-14 ENCOUNTER — Other Ambulatory Visit: Payer: Self-pay

## 2019-04-18 ENCOUNTER — Other Ambulatory Visit: Payer: Self-pay

## 2019-04-18 ENCOUNTER — Ambulatory Visit (INDEPENDENT_AMBULATORY_CARE_PROVIDER_SITE_OTHER): Payer: Medicare Other | Admitting: Family Medicine

## 2019-04-18 DIAGNOSIS — R110 Nausea: Secondary | ICD-10-CM

## 2019-04-18 DIAGNOSIS — R109 Unspecified abdominal pain: Secondary | ICD-10-CM

## 2019-04-18 NOTE — Progress Notes (Signed)
Patient ID: Deborah Mcconnell, female   DOB: 06/01/1951, 68 y.o.   MRN: [id number]    Virtual Visit via video Note  This visit type was conducted due to national recommendations for restrictions regarding the COVID-19 pandemic (e.g. social distancing).  This format is felt to be most appropriate for this patient at this time.  All issues noted in this document were discussed and addressed.  No physical exam was performed (except for noted visual exam findings with Video Visits).   I connected with Lorie Apley today at  3:40 PM EDT by a video enabled telemedicine application and verified that I am speaking with the correct person using two identifiers. Location patient: home Location provider: work or home office Persons participating in the virtual visit: patient, provider  I discussed the limitations, risks, security and privacy concerns of performing an evaluation and management service by video and the availability of in person appointments. I also discussed with the patient that there may be a patient responsible charge related to this service. The patient expressed understanding and agreed to proceed.  HPI:  Patient and I connected via video due to complaint of nausea and stomach cramping that lasted for 4 to 5 days and now has slowly begun to resolve.  Patient wondering if she has IBS.  States these episodes will occur on occasion, maybe anywhere from 2-6 times per year.  Not sure if any sort of food or food group triggers them.  Denies vomiting or diarrhea.  Denies fever or chills.  Patient has never been diagnosed with any sort of abdominal or intestinal disease like ulcerative colitis or Crohn's.  ROS: See pertinent positives and negatives per HPI.  Past Medical History:  Diagnosis Date  . Arthritis    osteoarthritis  . Breast cancer, left ([place]) 2014   Hx Lumpectomy and Rad tx's.  Marland Kitchen Hyperlipidemia   . Osteopenia    previously taking Evista and Actonel  . Osteoporosis   .  Vertigo     Past Surgical History:  Procedure Laterality Date  . CHOLECYSTECTOMY    . lt breast lumpectomy  2014    Family History  Problem Relation Age of Onset  . Osteoporosis Mother   . Hypertension Mother   . Osteoarthritis Mother   . Osteoporosis Sister   . Osteoarthritis Sister   . Osteoarthritis Maternal Grandmother   . Diabetes Maternal Grandfather   . Cancer Paternal Grandfather        unknown  . Prostate cancer Neg Hx   . Bladder Cancer Neg Hx   . Kidney cancer Neg Hx    Social History   Tobacco Use  . Smoking status: Never Smoker  . Smokeless tobacco: Never Used  Substance Use Topics  . Alcohol use: Yes    Alcohol/week: 0.0 standard drinks    Comment: rare    Current Outpatient Medications:  .  Ascorbic Acid (VITAMIN C) 1000 MG tablet, Take 1,000 mg by mouth daily., Disp: , Rfl:  .  B Complex Vitamins (VITAMIN B COMPLEX PO), Take by mouth., Disp: , Rfl:  .  calcium carbonate (CALCIUM 600) 600 MG TABS tablet, Take by mouth. Take 1,200 mg by mouth., Disp: , Rfl:  .  cephALEXin (KEFLEX) 250 MG capsule, Take 1 capsule (250 mg total) by mouth as needed., Disp: 90 capsule, Rfl: 0 .  cholecalciferol (VITAMIN D) 1000 UNITS tablet, Take 1,000 Units by mouth daily., Disp: , Rfl:  .  CRANBERRY PO, Take by mouth., Disp: , Rfl:  .  diclofenac sodium (VOLTAREN) 1 % GEL, Apply 4 g topically 4 (four) times daily., Disp: 1 Tube, Rfl: 3 .  ibuprofen (ADVIL,MOTRIN) 200 MG tablet, Take 600 mg by mouth 2 (two) times daily. prn, Disp: , Rfl:  .  letrozole (FEMARA) 2.5 MG tablet, TAKE ONE TABLET BY MOUTH ONCE DAILY, Disp: 30 tablet, Rfl: 0 .  Magnesium 250 MG TABS, Take 1 tablet by mouth daily., Disp: , Rfl:  .  meclizine (ANTIVERT) 25 MG tablet, Take 1 tablet (25 mg total) by mouth 3 (three) times daily as needed., Disp: 90 tablet, Rfl: 0 .  ondansetron (ZOFRAN) 4 MG tablet, Take 1 tablet (4 mg total) by mouth every 8 (eight) hours as needed for nausea or vomiting., Disp: 20  tablet, Rfl: 0 .  Potassium 99 MG TABS, Take 1 tablet by mouth daily., Disp: , Rfl:  .  simvastatin (ZOCOR) 20 MG tablet, Take 1 tablet (20 mg total) by mouth every evening., Disp: 90 tablet, Rfl: 3  EXAM:  GENERAL: alert, oriented, appears well and in no acute distress  HEENT: atraumatic, conjunttiva clear, no obvious abnormalities on inspection of external nose and ears  NECK: normal movements of the head and neck  LUNGS: on inspection no signs of respiratory distress, breathing rate appears normal, no obvious gross SOB, gasping or wheezing  CV: no obvious cyanosis  MS: moves all visible extremities without noticeable abnormality  PSYCH/NEURO: pleasant and cooperative, no obvious depression or anxiety, speech and thought processing grossly intact  ASSESSMENT AND PLAN:  Discussed the following assessment and plan:  Abdominal cramping/nausea - suspect patient's symptoms could be a mild form of irritable bowel syndrome.  Advised patient that IBS is a diagnosis of exclusion, so usually they will rule out other abdominal and or intestinal illnesses before they say it is in fact IBS.  Advised patient it is happening seldomly, she can treat as needed with an over-the-counter Pepto-Bismol and monitor for any foods or food groups that seem to irritate her.  Also advised if she is having some nausea, she can start by drinking clear liquids and eating bland foods for 24 hours and slowly advance diet as tolerated to help offset it.  Also offered referral to gastroenterology for further evaluation and possible endoscopy and/or colonoscopy, declines at this time and will try a more conservative approach first and let us know if episodes are happening more and more.   I discussed the assessment and treatment plan with the patient. The patient was provided an opportunity to ask questions and all were answered. The patient agreed with the plan and demonstrated an understanding of the instructions.   The  patient was advised to call back or seek an in-person evaluation if the symptoms worsen or if the condition fails to improve as anticipated.  Jodelle Green, FNP

## 2019-04-26 ENCOUNTER — Ambulatory Visit (INDEPENDENT_AMBULATORY_CARE_PROVIDER_SITE_OTHER): Payer: Medicare Other

## 2019-04-26 ENCOUNTER — Other Ambulatory Visit: Payer: Self-pay

## 2019-04-26 DIAGNOSIS — Z23 Encounter for immunization: Secondary | ICD-10-CM | POA: Diagnosis not present

## 2019-07-05 DIAGNOSIS — L308 Other specified dermatitis: Secondary | ICD-10-CM | POA: Diagnosis not present

## 2019-08-02 DIAGNOSIS — L438 Other lichen planus: Secondary | ICD-10-CM | POA: Diagnosis not present

## 2019-08-02 DIAGNOSIS — D485 Neoplasm of uncertain behavior of skin: Secondary | ICD-10-CM | POA: Diagnosis not present

## 2019-08-02 DIAGNOSIS — Z20828 Contact with and (suspected) exposure to other viral communicable diseases: Secondary | ICD-10-CM | POA: Diagnosis not present

## 2019-08-07 DIAGNOSIS — Z23 Encounter for immunization: Secondary | ICD-10-CM | POA: Diagnosis not present

## 2019-08-20 ENCOUNTER — Other Ambulatory Visit: Payer: Self-pay

## 2019-08-20 DIAGNOSIS — R300 Dysuria: Secondary | ICD-10-CM

## 2019-08-22 ENCOUNTER — Ambulatory Visit (INDEPENDENT_AMBULATORY_CARE_PROVIDER_SITE_OTHER): Payer: Medicare Other | Admitting: Family

## 2019-08-22 ENCOUNTER — Ambulatory Visit (INDEPENDENT_AMBULATORY_CARE_PROVIDER_SITE_OTHER): Payer: Medicare Other

## 2019-08-22 ENCOUNTER — Other Ambulatory Visit: Payer: Self-pay

## 2019-08-22 ENCOUNTER — Encounter: Payer: Self-pay | Admitting: Family

## 2019-08-22 VITALS — Ht 71.0 in | Wt 183.0 lb

## 2019-08-22 DIAGNOSIS — E559 Vitamin D deficiency, unspecified: Secondary | ICD-10-CM | POA: Diagnosis not present

## 2019-08-22 DIAGNOSIS — C50912 Malignant neoplasm of unspecified site of left female breast: Secondary | ICD-10-CM

## 2019-08-22 DIAGNOSIS — G8929 Other chronic pain: Secondary | ICD-10-CM | POA: Diagnosis not present

## 2019-08-22 DIAGNOSIS — I479 Paroxysmal tachycardia, unspecified: Secondary | ICD-10-CM

## 2019-08-22 DIAGNOSIS — Z0000 Encounter for general adult medical examination without abnormal findings: Secondary | ICD-10-CM | POA: Diagnosis not present

## 2019-08-22 DIAGNOSIS — M546 Pain in thoracic spine: Secondary | ICD-10-CM | POA: Diagnosis not present

## 2019-08-22 DIAGNOSIS — E782 Mixed hyperlipidemia: Secondary | ICD-10-CM

## 2019-08-22 DIAGNOSIS — M8580 Other specified disorders of bone density and structure, unspecified site: Secondary | ICD-10-CM

## 2019-08-22 MED ORDER — CEPHALEXIN 250 MG PO CAPS: 250.0000 mg | ORAL_CAPSULE | ORAL | 0 refills | Status: DC | PRN

## 2019-08-22 NOTE — Assessment & Plan Note (Addendum)
Stable, she continues to follow with oncology.  Will follow

## 2019-08-22 NOTE — Progress Notes (Signed)
Virtual Visit via Video Note  I connected with@  on 08/22/19 at  9:00 AM EST by a video enabled telemedicine application and verified that I am speaking with the correct person using two identifiers.  Location patient: home Location provider:work  Persons participating in the virtual visit: patient, provider  I discussed the limitations of evaluation and management by telemedicine and the availability of in person appointments. The patient expressed understanding and agreed to proceed.   HPI: Follow up Complains of dull ache in low back for months, unchanged. No numbness. Worse in the morning and resolves with walking. No falls or trouble with balance.   Tachycardia- has stopped caffeine and symptom resolved. Denies exertional chest pain or pressure, numbness or tingling radiating to left arm or jaw, palpitations, dizziness, frequent headaches, changes in vision, or shortness of breath.   Osteopenia- [facility]- follows with him every other year. Last dexa 2019. On calcium and vitamin d.   Walks for exercise.   Left breast cancer history- on femara, follows with Dr Juanita Craver at [facility]: See pertinent positives and negatives per HPI.  Past Medical History:  Diagnosis Date  . Arthritis    osteoarthritis  . Breast cancer, left ([place]) 2014   Hx Lumpectomy and Rad tx's.  Marland Kitchen Hyperlipidemia   . Osteopenia    previously taking Evista and Actonel  . Osteoporosis   . Vertigo     Past Surgical History:  Procedure Laterality Date  . CHOLECYSTECTOMY    . lt breast lumpectomy  2014    Family History  Problem Relation Age of Onset  . Osteoporosis Mother   . Hypertension Mother   . Osteoarthritis Mother   . Osteoporosis Sister   . Osteoarthritis Sister   . Osteoarthritis Maternal Grandmother   . Diabetes Maternal Grandfather   . Cancer Paternal Grandfather        unknown  . Prostate cancer Neg Hx   . Bladder Cancer Neg Hx   . Kidney cancer Neg Hx     SOCIAL HX:  non smoker   Current Outpatient Medications:  .  Ascorbic Acid (VITAMIN C) 1000 MG tablet, Take 1,000 mg by mouth daily., Disp: , Rfl:  .  B Complex Vitamins (VITAMIN B COMPLEX PO), Take by mouth., Disp: , Rfl:  .  calcium carbonate (CALCIUM 600) 600 MG TABS tablet, Take by mouth. Take 1,200 mg by mouth., Disp: , Rfl:  .  cephALEXin (KEFLEX) 250 MG capsule, Take 1 capsule (250 mg total) by mouth as needed., Disp: 90 capsule, Rfl: 0 .  cholecalciferol (VITAMIN D) 1000 UNITS tablet, Take 1,000 Units by mouth daily., Disp: , Rfl:  .  CRANBERRY PO, Take by mouth., Disp: , Rfl:  .  diclofenac sodium (VOLTAREN) 1 % GEL, Apply 4 g topically 4 (four) times daily., Disp: 1 Tube, Rfl: 3 .  ibuprofen (ADVIL,MOTRIN) 200 MG tablet, Take 600 mg by mouth 2 (two) times daily. prn, Disp: , Rfl:  .  letrozole (FEMARA) 2.5 MG tablet, TAKE ONE TABLET BY MOUTH ONCE DAILY, Disp: 30 tablet, Rfl: 0 .  Magnesium 250 MG TABS, Take 1 tablet by mouth daily., Disp: , Rfl:  .  meclizine (ANTIVERT) 25 MG tablet, Take 1 tablet (25 mg total) by mouth 3 (three) times daily as needed., Disp: 90 tablet, Rfl: 0 .  Potassium 99 MG TABS, Take 1 tablet by mouth daily., Disp: , Rfl:  .  simvastatin (ZOCOR) 20 MG tablet, Take 1 tablet (20 mg total)  by mouth every evening., Disp: 90 tablet, Rfl: 3 .  ondansetron (ZOFRAN) 4 MG tablet, Take 1 tablet (4 mg total) by mouth every 8 (eight) hours as needed for nausea or vomiting., Disp: 20 tablet, Rfl: 0  EXAM:  VITALS per patient if applicable:  GENERAL: alert, oriented, appears well and in no acute distress  HEENT: atraumatic, conjunttiva clear, no obvious abnormalities on inspection of external nose and ears  NECK: normal movements of the head and neck  LUNGS: on inspection no signs of respiratory distress, breathing rate appears normal, no obvious gross SOB, gasping or wheezing  CV: no obvious cyanosis  MS: moves all visible extremities without noticeable abnormality. Points to  midline lumbar back where pain is. Moving freely.   PSYCH/NEURO: pleasant and cooperative, no obvious depression or anxiety, speech and thought processing grossly intact  ASSESSMENT AND PLAN:  Discussed the following assessment and plan:  No diagnosis found. Problem List Items Addressed This Visit      Cardiovascular and Mediastinum   Paroxysmal tachycardia ([place]) - Primary    Stable. Asymptomatic. She will continue to monitor.       Relevant Orders   Comprehensive metabolic panel   TSH   Ferritin     Musculoskeletal and Integument   Osteopenia   Relevant Orders   Ferritin     Other   Hyperlipidemia   Relevant Orders   Lipid panel   Ferritin   Malignant neoplasm of breast (female) ([place])    Stable, she continues to follow with oncology.  Will follow      Relevant Orders   IBC + Ferritin   CBC with Differential/Platelet   Ferritin   Midline thoracic back pain    Chronic, stable.  Discussed with patient most likely degenerative disc disease, arthritis however with her history of breast cancer, we jointly agreed on pursuing x-rays.  She politely declined any further work-up in regards medication or physical therapy at this time      Relevant Orders   DG Thoracic Spine W/Swimmers   DG Lumbar Spine Complete   Ferritin   Vitamin D deficiency   Relevant Orders   VITAMIN D 25 Hydroxy (Vit-D Deficiency, Fractures)   Ferritin      -we discussed possible serious and likely etiologies, options for evaluation and workup, limitations of telemedicine visit vs in person visit, treatment, treatment risks and precautions. Pt prefers to treat via telemedicine empirically rather then risking or undertaking an in person visit at this moment. Patient agrees to seek prompt in person care if worsening, new symptoms arise, or if is not improving with treatment.   I discussed the assessment and treatment plan with the patient. The patient was provided an opportunity to ask questions and  all were answered. The patient agreed with the plan and demonstrated an understanding of the instructions.   The patient was advised to call back or seek an in-person evaluation if the symptoms worsen or if the condition fails to improve as anticipated.   Mable Paris, FNP

## 2019-08-22 NOTE — Progress Notes (Addendum)
Subjective:   Deborah Mcconnell is a 69 y.o. female who presents for Medicare Annual (Subsequent) preventive examination.  Review of Systems:  No ROS.  Medicare Wellness Virtual Visit.  Visual/audio telehealth visit, UTA vital signs.   Ht/Wt provided. See social history for additional risk factors.     Objective:     Vitals: Ht 5\' 11"  (1.803 m)   Wt 183 lb (83 kg)   BMI 25.52 kg/m   Body mass index is 25.52 kg/m.  Advanced Directives 08/22/2019 08/19/2018 07/25/2018 08/18/2017 08/15/2016 08/04/2016 08/28/2015  Does Patient Have a Medical Advance Directive? Yes Yes No Yes Yes Yes Yes  Type of Advance Directive Living will;Healthcare Power of [place];Living will - [place];Living will [place];Living will [place];Living will Living will;Healthcare Power of Attorney  Does patient want to make changes to medical advance directive? No - Patient declined No - Patient declined - No - Patient declined No - Patient declined No - Patient declined No - Patient declined  Copy of [place] in Chart? No - copy requested No - copy requested - No - copy requested No - copy requested - -  Would patient like information on creating a medical advance directive? - - No - Patient declined - - - -    Tobacco Social History   Tobacco Use  Smoking Status Never Smoker  Smokeless Tobacco Never Used     Counseling given: Not Answered   Clinical Intake:  Pre-visit preparation completed: Yes        Diabetes: No  How often do you need to have someone help you when you read instructions, pamphlets, or other written materials from your doctor or pharmacy?: 1 - Never  Interpreter Needed?: No     Past Medical History:  Diagnosis Date  . Arthritis    osteoarthritis  . Breast cancer, left ([place]) 2014   Hx Lumpectomy and Rad tx's.  Marland Kitchen Hyperlipidemia   . Osteopenia    previously taking  Evista and Actonel  . Osteoporosis   . Vertigo    Past Surgical History:  Procedure Laterality Date  . CHOLECYSTECTOMY    . lt breast lumpectomy  2014   Family History  Problem Relation Age of Onset  . Osteoporosis Mother   . Hypertension Mother   . Osteoarthritis Mother   . Osteoporosis Sister   . Osteoarthritis Sister   . Osteoarthritis Maternal Grandmother   . Diabetes Maternal Grandfather   . Cancer Paternal Grandfather        unknown  . Prostate cancer Neg Hx   . Bladder Cancer Neg Hx   . Kidney cancer Neg Hx    Social History   Socioeconomic History  . Marital status: Married    Spouse name: Not on file  . Number of children: Not on file  . Years of education: Not on file  . Highest education level: Not on file  Occupational History  . Not on file  Tobacco Use  . Smoking status: Never Smoker  . Smokeless tobacco: Never Used  Substance and Sexual Activity  . Alcohol use: Yes    Alcohol/week: 0.0 standard drinks    Comment: rare  . Drug use: No  . Sexual activity: Yes  Other Topics Concern  . Not on file  Social History Narrative   'lindy'      Works at [employer] in [place].       Married.  Exercise- not excercising.          Social Determinants of Health   Financial Resource Strain: Low Risk   . Difficulty of Paying Living Expenses: Not hard at all  Food Insecurity: No Food Insecurity  . Worried About Charity fundraiser in the Last Year: Never true  . Ran Out of Food in the Last Year: Never true  Transportation Needs: No Transportation Needs  . Lack of Transportation (Medical): No  . Lack of Transportation (Non-Medical): No  Physical Activity: Sufficiently Active  . Days of Exercise per Week: 5 days  . Minutes of Exercise per Session: 30 min  Stress: No Stress Concern Present  . Feeling of Stress : Not at all  Social Connections: Not Isolated  . Frequency of Communication with Friends and Family: More than three times a  week  . Frequency of Social Gatherings with Friends and Family: More than three times a week  . Attends Religious Services: 1 to 4 times per year  . Active Member of Clubs or Organizations: Yes  . Attends Archivist Meetings: Not on file  . Marital Status: Married    Outpatient Encounter Medications as of 08/22/2019  Medication Sig  . Ascorbic Acid (VITAMIN C) 1000 MG tablet Take 1,000 mg by mouth daily.  . B Complex Vitamins (VITAMIN B COMPLEX PO) Take by mouth.  . calcium carbonate (CALCIUM 600) 600 MG TABS tablet Take by mouth. Take 1,200 mg by mouth.  . cholecalciferol (VITAMIN D) 1000 UNITS tablet Take 1,000 Units by mouth daily.  Marland Kitchen CRANBERRY PO Take by mouth.  . diclofenac sodium (VOLTAREN) 1 % GEL Apply 4 g topically 4 (four) times daily.  Marland Kitchen ibuprofen (ADVIL,MOTRIN) 200 MG tablet Take 600 mg by mouth 2 (two) times daily. prn  . letrozole (FEMARA) 2.5 MG tablet TAKE ONE TABLET BY MOUTH ONCE DAILY  . Magnesium 250 MG TABS Take 1 tablet by mouth daily.  . meclizine (ANTIVERT) 25 MG tablet Take 1 tablet (25 mg total) by mouth 3 (three) times daily as needed.  . simvastatin (ZOCOR) 20 MG tablet Take 1 tablet (20 mg total) by mouth every evening.   No facility-administered encounter medications on file as of 08/22/2019.    Activities of Daily Living In your present state of health, do you have any difficulty performing the following activities: 08/22/2019 08/22/2019  Hearing? N N  Vision? N N  Difficulty concentrating or making decisions? N N  Walking or climbing stairs? N N  Dressing or bathing? N N  Doing errands, shopping? N N  Preparing Food and eating ? N -  Using the Toilet? N -  In the past six months, have you accidently leaked urine? N -  Do you have problems with loss of bowel control? N -  Managing your Medications? N -  Managing your Finances? N -  Housekeeping or managing your Housekeeping? N -  Some recent data might be hidden    Patient Care  Team: Burnard Hawthorne, FNP as PCP - General (Family Medicine)    Assessment:   This is a routine wellness examination for [place].  Nurse connected with patient 08/22/19 at 11:00 AM EST by a telephone enabled telemedicine application and verified that I am speaking with the correct person using two identifiers. Patient stated full name and DOB. Patient gave permission to continue with virtual visit. Patient's location was at home and Nurse's location was at [place] office.   Patient is alert and  oriented x3. Patient denies difficulty focusing or concentrating. Patient likes to assist grandchildren with homework, artistry with fabric and complete crossword puzzles for brain stimulation.   Health Maintenance Due: See completed HM at the end of note.   Eye: Visual acuity not assessed. Virtual visit. Followed by their ophthalmologist.  Dental: Visits every 6 months.    Hearing: Demonstrates normal hearing during visit.  Safety:  Patient feels safe at home- yes Patient does have smoke detectors at home- yes Patient does wear sunscreen or protective clothing when in direct sunlight - yes Patient does wear seat belt when in a moving vehicle - yes Patient drives- yes Adequate lighting in walkways free from debris- yes Grab bars and handrails used as appropriate- yes Ambulates with an assistive device- no Cell phone on person when ambulating outside of the home- yes  Social: Alcohol intake - yes      Smoking history- never   Smokers in home? none Illicit drug use? none  Medication: Taking as directed and without issues.  Self managed - yes   Covid-19: Precautions and sickness symptoms discussed. Wears mask, social distancing, hand hygiene as appropriate.   Activities of Daily Living Patient denies needing assistance with: household chores, feeding themselves, getting from bed to chair, getting to the toilet, bathing/showering, dressing, managing money, or preparing meals.    Discussed the importance of a healthy diet, water intake and the benefits of aerobic exercise.   Physical activity- strength building daily, walking 3-4 days weekly  Diet:  Regular Water: good intake Caffeine: none  Other Providers Patient Care Team: Burnard Hawthorne, FNP as PCP - General (Family Medicine)  Exercise Activities and Dietary recommendations Current Exercise Habits: Home exercise routine, Type of exercise: strength training/weights;walking, Frequency (Times/Week): 5, Intensity: Mild  Goals    . Follow up with Primary Care Provider     As needed       Fall Risk Fall Risk  08/22/2019 04/18/2019 08/19/2018 08/18/2017 02/03/2017  Falls in the past year? 0 0 1 No No  Number falls in past yr: - 0 0 - -  Injury with Fall? - 0 1 - -  Comment - - Followed by ortho and pcp. Broken R wrist. She fell from a ladder. 3 weeks ago.  - -  Follow up Falls evaluation completed Falls evaluation completed - - -   Timed Get Up and Go performed: no, virtual visit  Depression Screen PHQ 2/9 Scores 08/22/2019 04/18/2019 12/06/2018 08/19/2018  PHQ - 2 Score 0 0 0 0  PHQ- 9 Score - 0 0 -     Cognitive Function MMSE - Mini Mental State Exam 08/19/2018 08/18/2017 08/15/2016  Orientation to time 5 5 5   Orientation to Place 5 5 5   Registration 3 3 3   Attention/ Calculation 5 5 5   Recall 3 3 3   Language- name 2 objects 2 2 2   Language- repeat 1 1 1   Language- follow 3 step command 3 3 3   Language- read & follow direction 1 1 1   Write a sentence 1 1 1   Copy design 1 1 1   Total score 30 30 30      6CIT Screen 08/22/2019 08/15/2016  What Year? 0 points 0 points  What month? 0 points 0 points  What time? 0 points 0 points  Count back from 20 0 points 0 points  Months in reverse 0 points 0 points  Repeat phrase 0 points 0 points  Total Score 0 0    Immunization  History  Administered Date(s) Administered  . Fluad Quad(high Dose 65+) 04/26/2019  . Influenza Whole 04/13/2013  .  Influenza, High Dose Seasonal PF 02/27/2016, 04/17/2017, 03/31/2018  . Influenza,inj,Quad PF,6+ Mos 06/13/2015  . Influenza-Unspecified 04/03/2014, 06/13/2015  . Moderna SARS-COVID-2 Vaccination 08/07/2019  . Pneumococcal Conjugate-13 09/10/2015  . Pneumococcal Polysaccharide-23 04/28/2012, 08/18/2017  . Tdap 10/05/2014  . Zoster 05/08/2014   Screening Tests Health Maintenance  Topic Date Due  . MAMMOGRAM  03/04/2021  . COLONOSCOPY  07/24/2024  . TETANUS/TDAP  10/04/2024  . INFLUENZA VACCINE  Completed  . DEXA SCAN  Completed  . Hepatitis C Screening  Completed  . PNA vac Low Risk Adult  Completed      Plan:   Keep all routine maintenance appointments.   Medicare Attestation I have personally reviewed: The patient's medical and social history Their use of alcohol, tobacco or illicit drugs Their current medications and supplements The patient's functional ability including ADLs,fall risks, home safety risks, cognitive, and hearing and visual impairment Diet and physical activities Evidence for depression   I have reviewed and discussed with patient certain preventive protocols, quality metrics, and best practice recommendations.      Varney Biles, LPN  [phone]  Agree with plan. Mable Paris, NP

## 2019-08-22 NOTE — Assessment & Plan Note (Signed)
Chronic, stable.  Discussed with patient most likely degenerative disc disease, arthritis however with her history of breast cancer, we jointly agreed on pursuing x-rays.  She politely declined any further work-up in regards medication or physical therapy at this time

## 2019-08-22 NOTE — Patient Instructions (Addendum)
  Deborah Mcconnell , Thank you for taking time to come for your Medicare Wellness Visit. I appreciate your ongoing commitment to your health goals. Please review the following plan we discussed and let me know if I can assist you in the future.   These are the goals we discussed: Goals    . Follow up with Primary Care Provider     As needed       This is a list of the screening recommended for you and due dates:  Health Maintenance  Topic Date Due  . Mammogram  03/05/2019  . Colon Cancer Screening  07/24/2024  . Tetanus Vaccine  10/04/2024  . Flu Shot  Completed  . DEXA scan (bone density measurement)  Completed  .  Hepatitis C: One time screening is recommended by Center for Disease Control  (CDC) for  adults born from [birth year] through 1965.   Completed  . Pneumonia vaccines  Completed

## 2019-08-22 NOTE — Assessment & Plan Note (Signed)
Stable. Asymptomatic. She will continue to monitor.

## 2019-08-24 ENCOUNTER — Telehealth: Payer: Self-pay | Admitting: Family

## 2019-08-24 NOTE — Telephone Encounter (Signed)
Advise pt to have labs, xrays at [facility]. She doesn't need an appt; she can walk in.  Return in about 6 months (around 02/19/2020) for Follow Up Chronic Management

## 2019-08-25 ENCOUNTER — Ambulatory Visit
Admission: RE | Admit: 2019-08-25 | Discharge: 2019-08-25 | Disposition: A | Discharge status: Home / Self Care | Payer: Medicare Other | Source: Ambulatory Visit | Attending: Family | Admitting: Family

## 2019-08-25 ENCOUNTER — Other Ambulatory Visit
Admission: RE | Admit: 2019-08-25 | Discharge: 2019-08-25 | Disposition: A | Discharge status: Home / Self Care | Payer: Medicare Other | Source: Ambulatory Visit | Attending: Family | Admitting: Family

## 2019-08-25 DIAGNOSIS — I479 Paroxysmal tachycardia, unspecified: Secondary | ICD-10-CM | POA: Insufficient documentation

## 2019-08-25 DIAGNOSIS — C50912 Malignant neoplasm of unspecified site of left female breast: Secondary | ICD-10-CM | POA: Diagnosis not present

## 2019-08-25 DIAGNOSIS — M545 Low back pain: Secondary | ICD-10-CM | POA: Insufficient documentation

## 2019-08-25 DIAGNOSIS — M47816 Spondylosis without myelopathy or radiculopathy, lumbar region: Secondary | ICD-10-CM | POA: Diagnosis not present

## 2019-08-25 DIAGNOSIS — M419 Scoliosis, unspecified: Secondary | ICD-10-CM | POA: Diagnosis not present

## 2019-08-25 DIAGNOSIS — M8580 Other specified disorders of bone density and structure, unspecified site: Secondary | ICD-10-CM | POA: Insufficient documentation

## 2019-08-25 DIAGNOSIS — M546 Pain in thoracic spine: Secondary | ICD-10-CM | POA: Insufficient documentation

## 2019-08-25 DIAGNOSIS — E559 Vitamin D deficiency, unspecified: Secondary | ICD-10-CM | POA: Diagnosis not present

## 2019-08-25 DIAGNOSIS — G8929 Other chronic pain: Secondary | ICD-10-CM | POA: Insufficient documentation

## 2019-08-25 DIAGNOSIS — M47814 Spondylosis without myelopathy or radiculopathy, thoracic region: Secondary | ICD-10-CM | POA: Diagnosis not present

## 2019-08-25 DIAGNOSIS — E782 Mixed hyperlipidemia: Secondary | ICD-10-CM | POA: Insufficient documentation

## 2019-08-25 DIAGNOSIS — M47812 Spondylosis without myelopathy or radiculopathy, cervical region: Secondary | ICD-10-CM | POA: Insufficient documentation

## 2019-08-25 LAB — CBC WITH DIFFERENTIAL/PLATELET
Abs Immature Granulocytes: 0.01 10*3/uL (ref 0.00–0.07)
Basophils Absolute: 0 10*3/uL (ref 0.0–0.1)
Basophils Relative: 1 %
Eosinophils Absolute: 0.2 10*3/uL (ref 0.0–0.5)
Eosinophils Relative: 5 %
HCT: 43.5 % (ref 36.0–46.0)
Hemoglobin: 14.6 g/dL (ref 12.0–15.0)
Immature Granulocytes: 0 %
Lymphocytes Relative: 27 %
Lymphs Abs: 1.3 10*3/uL (ref 0.7–4.0)
MCH: 31.7 pg (ref 26.0–34.0)
MCHC: 33.6 g/dL (ref 30.0–36.0)
MCV: 94.6 fL (ref 80.0–100.0)
Monocytes Absolute: 0.4 10*3/uL (ref 0.1–1.0)
Monocytes Relative: 9 %
Neutro Abs: 2.8 10*3/uL (ref 1.7–7.7)
Neutrophils Relative %: 58 %
Platelets: 235 10*3/uL (ref 150–400)
RBC: 4.6 MIL/uL (ref 3.87–5.11)
RDW: 12.2 % (ref 11.5–15.5)
WBC: 4.8 10*3/uL (ref 4.0–10.5)
nRBC: 0 % (ref 0.0–0.2)

## 2019-08-25 LAB — COMPREHENSIVE METABOLIC PANEL
ALT: 16 U/L (ref 0–44)
AST: 20 U/L (ref 15–41)
Albumin: 4 g/dL (ref 3.5–5.0)
Alkaline Phosphatase: 50 U/L (ref 38–126)
Anion gap: 5 (ref 5–15)
BUN: 16 mg/dL (ref 8–23)
CO2: 31 mmol/L (ref 22–32)
Calcium: 9.3 mg/dL (ref 8.9–10.3)
Chloride: 106 mmol/L (ref 98–111)
Creatinine, Ser: 0.82 mg/dL (ref 0.44–1.00)
GFR calc Af Amer: >60 mL/min (ref >60)
GFR calc non Af Amer: >60 mL/min (ref >60)
Glucose, Bld: 104 mg/dL — ABNORMAL HIGH (ref 70–99)
Potassium: 4.2 mmol/L (ref 3.5–5.1)
Sodium: 142 mmol/L (ref 135–145)
Total Bilirubin: 0.6 mg/dL (ref 0.3–1.2)
Total Protein: 7.3 g/dL (ref 6.5–8.1)

## 2019-08-25 LAB — IRON AND TIBC
Iron: 86 ug/dL (ref 28–170)
Saturation Ratios: 27 % (ref 10.4–31.8)
TIBC: 325 ug/dL (ref 250–450)
UIBC: 239 ug/dL

## 2019-08-25 LAB — LIPID PANEL
Cholesterol: 184 mg/dL (ref 0–200)
HDL: 59 mg/dL (ref >40)
LDL Cholesterol: 108 mg/dL — ABNORMAL HIGH (ref 0–99)
Total CHOL/HDL Ratio: 3.1 RATIO
Triglycerides: 85 mg/dL (ref <150)
VLDL: 17 mg/dL (ref 0–40)

## 2019-08-25 LAB — FERRITIN: Ferritin: 25 ng/mL (ref 11–307)

## 2019-08-25 LAB — VITAMIN D 25 HYDROXY (VIT D DEFICIENCY, FRACTURES): Vit D, 25-Hydroxy: 47.96 ng/mL (ref 30–100)

## 2019-08-25 LAB — TSH: TSH: 2.278 u[IU]/mL (ref 0.350–4.500)

## 2019-08-28 ENCOUNTER — Other Ambulatory Visit: Payer: Self-pay | Admitting: Family

## 2019-08-28 DIAGNOSIS — E782 Mixed hyperlipidemia: Secondary | ICD-10-CM

## 2019-08-29 ENCOUNTER — Other Ambulatory Visit: Payer: Self-pay

## 2019-08-29 MED ORDER — SIMVASTATIN 40 MG PO TABS: 40.0000 mg | ORAL_TABLET | Freq: Every day | ORAL | 3 refills | Status: DC

## 2019-09-05 DIAGNOSIS — H43393 Other vitreous opacities, bilateral: Secondary | ICD-10-CM | POA: Diagnosis not present

## 2019-09-05 DIAGNOSIS — H25813 Combined forms of age-related cataract, bilateral: Secondary | ICD-10-CM | POA: Diagnosis not present

## 2019-09-05 DIAGNOSIS — H2513 Age-related nuclear cataract, bilateral: Secondary | ICD-10-CM | POA: Diagnosis not present

## 2019-09-05 DIAGNOSIS — H04123 Dry eye syndrome of bilateral lacrimal glands: Secondary | ICD-10-CM | POA: Diagnosis not present

## 2019-09-06 ENCOUNTER — Telehealth: Payer: Self-pay | Admitting: Family

## 2019-09-07 DIAGNOSIS — Z23 Encounter for immunization: Secondary | ICD-10-CM | POA: Diagnosis not present

## 2019-09-07 NOTE — Telephone Encounter (Signed)
error 

## 2019-10-11 ENCOUNTER — Other Ambulatory Visit (INDEPENDENT_AMBULATORY_CARE_PROVIDER_SITE_OTHER): Payer: Medicare Other

## 2019-10-11 ENCOUNTER — Other Ambulatory Visit: Payer: Self-pay

## 2019-10-11 DIAGNOSIS — E782 Mixed hyperlipidemia: Secondary | ICD-10-CM | POA: Diagnosis not present

## 2019-10-11 LAB — COMPREHENSIVE METABOLIC PANEL
ALT: 15 U/L (ref 0–35)
AST: 19 U/L (ref 0–37)
Albumin: 4.1 g/dL (ref 3.5–5.2)
Alkaline Phosphatase: 58 U/L (ref 39–117)
BUN: 15 mg/dL (ref 6–23)
CO2: 30 mEq/L (ref 19–32)
Calcium: 9.5 mg/dL (ref 8.4–10.5)
Chloride: 105 mEq/L (ref 96–112)
Creatinine, Ser: 0.69 mg/dL (ref 0.40–1.20)
GFR: 84.32 mL/min (ref >60.00)
Glucose, Bld: 108 mg/dL — ABNORMAL HIGH (ref 70–99)
Potassium: 4.2 mEq/L (ref 3.5–5.1)
Sodium: 141 mEq/L (ref 135–145)
Total Bilirubin: 0.5 mg/dL (ref 0.2–1.2)
Total Protein: 6.5 g/dL (ref 6.0–8.3)

## 2019-10-17 DIAGNOSIS — Z20822 Contact with and (suspected) exposure to covid-19: Secondary | ICD-10-CM | POA: Diagnosis not present

## 2019-10-17 DIAGNOSIS — H2513 Age-related nuclear cataract, bilateral: Secondary | ICD-10-CM | POA: Diagnosis not present

## 2019-10-19 DIAGNOSIS — H52221 Regular astigmatism, right eye: Secondary | ICD-10-CM | POA: Diagnosis not present

## 2019-10-19 DIAGNOSIS — H2511 Age-related nuclear cataract, right eye: Secondary | ICD-10-CM | POA: Diagnosis not present

## 2019-10-19 DIAGNOSIS — H25811 Combined forms of age-related cataract, right eye: Secondary | ICD-10-CM | POA: Diagnosis not present

## 2019-10-31 DIAGNOSIS — Z20822 Contact with and (suspected) exposure to covid-19: Secondary | ICD-10-CM | POA: Diagnosis not present

## 2019-10-31 DIAGNOSIS — H2513 Age-related nuclear cataract, bilateral: Secondary | ICD-10-CM | POA: Diagnosis not present

## 2019-11-02 DIAGNOSIS — H52222 Regular astigmatism, left eye: Secondary | ICD-10-CM | POA: Diagnosis not present

## 2019-11-02 DIAGNOSIS — H2512 Age-related nuclear cataract, left eye: Secondary | ICD-10-CM | POA: Diagnosis not present

## 2019-11-02 DIAGNOSIS — H25812 Combined forms of age-related cataract, left eye: Secondary | ICD-10-CM | POA: Diagnosis not present

## 2019-11-29 ENCOUNTER — Other Ambulatory Visit: Payer: Self-pay

## 2019-11-29 ENCOUNTER — Ambulatory Visit (INDEPENDENT_AMBULATORY_CARE_PROVIDER_SITE_OTHER): Payer: Medicare Other | Admitting: Urology

## 2019-11-29 ENCOUNTER — Encounter: Payer: Self-pay | Admitting: Urology

## 2019-11-29 VITALS — BP 130/76 | HR 83 | Ht 71.0 in | Wt 180.0 lb

## 2019-11-29 DIAGNOSIS — N390 Urinary tract infection, site not specified: Secondary | ICD-10-CM | POA: Diagnosis not present

## 2019-11-29 DIAGNOSIS — Z87448 Personal history of other diseases of urinary system: Secondary | ICD-10-CM

## 2019-11-29 LAB — URINALYSIS, COMPLETE
Bilirubin, UA: NEGATIVE
Glucose, UA: NEGATIVE
Ketones, UA: NEGATIVE
Nitrite, UA: NEGATIVE
Protein,UA: NEGATIVE
Specific Gravity, UA: 1.025 (ref 1.005–1.030)
Urobilinogen, Ur: 0.2 mg/dL (ref 0.2–1.0)
pH, UA: 7 (ref 5.0–7.5)

## 2019-11-29 LAB — MICROSCOPIC EXAMINATION: Bacteria, UA: NONE SEEN

## 2019-11-29 LAB — BLADDER SCAN AMB NON-IMAGING: Scan Result: 37

## 2019-11-29 NOTE — Progress Notes (Signed)
11/29/19 9:19 PM   Deborah Mcconnell [birth year] [id number]  Referring provider: Burnard Hawthorne, FNP [address] [place],  [place] [address] Chief Complaint  Patient presents with  . Follow-up    HPI: Deborah Mcconnell is a 69 y.o.female with a history of recurrent UTI's and a history of hematuria who presents today for a one year follow up.    History of recurrent UTI's Urine culture             10/21/2016 Variable resistant E.Coli             02/03/2017  Pan-sensitive E. Coli             02/24/2017 Pan-sensitive E. Coli  Currently on post-coital Keflex - once to twice weekly Not a candidate for vaginal estrogen cream due to history of breast cancer Patient has increased her water intake and is taking cranberry tablets.   No documented infections since last visit  History of hematuria (high risk) Nonsmoker.   Patient has had several hematuria workups in the past with no worrisome findings.    Most recent hematuria was then May 2018.  CTU Bilateral parapelvic kidney cyst.  Cysto with Dr. Pilar Mcconnell NED.  She does not report any gross hematuria at this time. Her UA is negative for micro heme today.   Has no complaints today. The patient is  experiencing urgency x 0-3, frequency x 0-3, not restricting fluids to avoid visits to the restroom, not engaging in toilet mapping, incontinence x 0-3 and nocturia x 0-3.   Her BP is 130/76.   Her PVR is 37.   Patient denies any modifying or aggravating factors.  Patient denies any dysuria or suprapubic/flank pain.  Patient denies any fevers, chills, nausea or vomiting.   PMH: Past Medical History:  Diagnosis Date  . Arthritis    osteoarthritis  . Breast cancer, left ([place]) 2014   Hx Lumpectomy and Rad tx's.  Marland Kitchen Hyperlipidemia   . Osteopenia    previously taking Evista and Actonel  . Osteoporosis   . Vertigo     Surgical History: Past Surgical History:  Procedure Laterality Date  . CHOLECYSTECTOMY    . lt breast  lumpectomy  2014    Home Medications:  Current Outpatient Medications on File Prior to Visit  Medication Sig Dispense Refill  . Ascorbic Acid (VITAMIN C) 1000 MG tablet Take 1,000 mg by mouth daily.    Marland Kitchen aspirin EC 81 MG tablet Take 81 mg by mouth daily.    . B Complex Vitamins (VITAMIN B COMPLEX PO) Take by mouth.    . Bacillus Coagulans-Inulin (PROBIOTIC) 1-250 BILLION-MG CAPS Take by mouth.    . calcium carbonate (CALCIUM 600) 600 MG TABS tablet Take by mouth. Take 1,200 mg by mouth.    . cephALEXin (KEFLEX) 500 MG capsule Take 500 mg by mouth as needed.    Marland Kitchen CRANBERRY PO Take by mouth.    . diclofenac sodium (VOLTAREN) 1 % GEL Apply 4 g topically 4 (four) times daily. 1 Tube 3  . ibuprofen (ADVIL,MOTRIN) 200 MG tablet Take 600 mg by mouth 2 (two) times daily. prn    . letrozole (FEMARA) 2.5 MG tablet TAKE ONE TABLET BY MOUTH ONCE DAILY 30 tablet 0  . meclizine (ANTIVERT) 25 MG tablet Take 1 tablet (25 mg total) by mouth 3 (three) times daily as needed. 90 tablet 0  . simvastatin (ZOCOR) 40 MG tablet Take 1 tablet (40 mg total) by  mouth at bedtime. 90 tablet 3   No current facility-administered medications on file prior to visit.    Allergies:  Allergies  Allergen Reactions  . Ciprofloxacin Anaphylaxis    Causes death  . Ofloxacin Anaphylaxis    Causes Death  . Quinolones Anaphylaxis  . Actonel [Risedronate]     Stomach pain  . Augmentin [Amoxicillin-Pot Clavulanate] Nausea And Vomiting  . Bactrim [Sulfamethoxazole-Trimethoprim] Hives    Itching, hives, and GI distress    Family History: Family History  Problem Relation Age of Onset  . Osteoporosis Mother   . Hypertension Mother   . Osteoarthritis Mother   . Osteoporosis Sister   . Osteoarthritis Sister   . Osteoarthritis Maternal Grandmother   . Diabetes Maternal Grandfather   . Cancer Paternal Grandfather        unknown  . Prostate cancer Neg Hx   . Bladder Cancer Neg Hx   . Kidney cancer Neg Hx     Social  History:  reports that she has never smoked. She has never used smokeless tobacco. She reports current alcohol use. She reports that she does not use drugs.  ROS For pertinent review of systems please refer to history of present illness  Physical Exam: BP 130/76   Pulse 83   Ht 5\' 11"  (1.803 m)   Wt 180 lb (81.6 kg)   BMI 25.10 kg/m   Constitutional:  Alert and oriented, No acute distress. HEENT: [place] AT, mask in place Trachea midline Cardiovascular: No clubbing, cyanosis, or edema. Respiratory: Normal respiratory effort, no increased work of breathing. Neurologic: Grossly intact, no focal deficits, moving all 4 extremities. Psychiatric: Normal mood and affect.  Laboratory Data: Lab Results  Component Value Date   CREATININE 0.69 10/11/2019    Lab Results  Component Value Date   HGBA1C 5.6 09/21/2017    Urinalysis Component     Latest Ref Rng & Units 11/29/2019  Specific Gravity, UA     1.005 - 1.030 1.025  pH, UA     5.0 - 7.5 7.0  Color, UA     Yellow Yellow  Appearance Ur     Clear Clear  Leukocytes,UA     Negative Trace (A)  Protein,UA     Negative/Trace Negative  Glucose, UA     Negative Negative  Ketones, UA     Negative Negative  RBC, UA     Negative Trace (A)  Bilirubin, UA     Negative Negative  Urobilinogen, Ur     0.2 - 1.0 mg/dL 0.2  Nitrite, UA     Negative Negative  Microscopic Examination      See below:   Component     Latest Ref Rng & Units 11/29/2019  WBC, UA     0 - 5 /hpf 0-5  RBC     0 - 2 /hpf 0-2  Epithelial Cells (non renal)     0 - 10 /hpf 0-10  Bacteria, UA     None seen/Few None seen    Pertinent Imaging: Results for Deborah, Mcconnell" (MRN [id number]) as of 11/29/2019 08:47  Ref. Range 11/29/2019 08:37  Scan Result Unknown 37    Assessment & Plan:    1. Recurrent UTIs Will continue to have good fluid intake Will continue to take cranberry pills Will continue to take her Keflex after intercourse Will report  any breakthrough infections RTC in one year for OAB questionnaire and PVR  2. History of Microscopic Hematuria Patients had several workups  in the past with the most recent being In 2018 with a CT urogram and cystoscopy, no worrisome findings. UA today is negative for microscopic hematuria She is going to follow up in one year for UA in one year - she report any gross hematuria in the interim    Zara Council, PA-C [place] [address] [place], [place] [address] [phone]  I, Deborah Mcconnell, am acting as a Education administrator for [employer], PA-C  I have reviewed the above documentation for accuracy and completeness, and I agree with the above.    Zara Council, PA-C

## 2020-03-02 ENCOUNTER — Encounter: Payer: Self-pay | Admitting: Family

## 2020-03-02 ENCOUNTER — Telehealth (INDEPENDENT_AMBULATORY_CARE_PROVIDER_SITE_OTHER): Payer: Medicare Other | Admitting: Family

## 2020-03-02 VITALS — Ht 71.0 in | Wt 185.0 lb

## 2020-03-02 DIAGNOSIS — R140 Abdominal distension (gaseous): Secondary | ICD-10-CM | POA: Diagnosis not present

## 2020-03-02 DIAGNOSIS — M545 Low back pain, unspecified: Secondary | ICD-10-CM

## 2020-03-02 DIAGNOSIS — M546 Pain in thoracic spine: Secondary | ICD-10-CM

## 2020-03-02 DIAGNOSIS — G8929 Other chronic pain: Secondary | ICD-10-CM

## 2020-03-02 DIAGNOSIS — I479 Paroxysmal tachycardia, unspecified: Secondary | ICD-10-CM

## 2020-03-02 DIAGNOSIS — E782 Mixed hyperlipidemia: Secondary | ICD-10-CM | POA: Diagnosis not present

## 2020-03-02 NOTE — Assessment & Plan Note (Addendum)
Resolved. Will follow Of note, I also advised to discontinue ASA in the next year for primary prevention as I explained that with age increases risk of GI bleeding on ASA. She will consider this at next visit.

## 2020-03-02 NOTE — Assessment & Plan Note (Signed)
Chronic. Discussed recent xrays again and agreed consult with orthopedics appropriate. Referral placed.

## 2020-03-02 NOTE — Assessment & Plan Note (Signed)
Etiology nonspecific at this time. Differentials include atypical presentation of GERD, bile acid malabsorption after cholecystectomy, food intolerance ( ?gluten, lactose). She will trial pepcid OTC and keep a journal of episodes and situation around. She declines further work up, labs, imaging at this time.

## 2020-03-02 NOTE — Assessment & Plan Note (Signed)
Suspect improved. Pending lipid panel.

## 2020-03-02 NOTE — Patient Instructions (Addendum)
Trial of pepcid ac , twice a day, and see if see if episodes . Pay attention to dairy, gluten.   Keep a journal and make an in person follow up to discuss bloating   Referral to orthopedics   Let us know if you dont hear back within a week in regards to an appointment being scheduled.

## 2020-03-02 NOTE — Progress Notes (Signed)
Virtual Visit via Video Note  I connected with@  on 03/02/20 at  9:00 AM EDT by a video enabled telemedicine application and verified that I am speaking with the correct person using two identifiers.  Location patient: home Location provider:work Persons participating in the virtual visit: patient, provider  I discussed the limitations of evaluation and management by telemedicine and the availability of in person appointments. The patient expressed understanding and agreed to proceed.   HPI: Feels well today Accepted interim position at church.  Continue to have low back pain. States that isnt 'pain' per say, it feels more like a 'discomfort'.  Walks everyday as 'only time back doesn't hurt'. No pain when moving. Most painful to bend over and worries about flexibility.  No numbness or tingling in legs.  Taking aleve which is helpful, takes most days.   No h/o GIB.   Complains of abdominal bloating in  Central 'lower abdomen', which had resolved for the past 3 weeks. 'more of inconvenience' Describes as episodes, had 2 episodes in the past several months.  Ate dinner at restaurant last night which she had salmon, asparagus, roasted potatoes, and this morning can feel rumbling sensation. Noticed that it occurs hours after a meal. Feels like there is a 'trigger' and not sure if spice. No dairy allergy that she knows of.  Describes episode of stomach rumbling which are a/w soft and frequent brown stools. Appetite remains 'good' during spell.  Taking probiotic. When has a spell, feels dizziness; no vertigo, syncope, chest pain, palpitations.  H/o vertigo for 10 years. Aside from episodes, no bloating.    Feels 'great' in between episodes.  No early satiety, weight loss, fever, vomiting, epigastric burning.  H/o cholecystectomy  Colonoscopy UTD  Paroxysmal tachycardia- no longer drinking caffeine and thinks this was culprit as no episode since.   HLD- compliant with zocor, increased at  last visit.    Left breast cancer history- on femara, follows with Dr Juanita Craver at [facility]: See pertinent positives and negatives per HPI.  Past Medical History:  Diagnosis Date  . Arthritis    osteoarthritis  . Breast cancer, left ([place]) 2014   Hx Lumpectomy and Rad tx's.  Marland Kitchen Hyperlipidemia   . Osteopenia    previously taking Evista and Actonel  . Osteoporosis   . Vertigo     Past Surgical History:  Procedure Laterality Date  . CHOLECYSTECTOMY    . lt breast lumpectomy  2014    Family History  Problem Relation Age of Onset  . Osteoporosis Mother   . Hypertension Mother   . Osteoarthritis Mother   . Osteoporosis Sister   . Osteoarthritis Sister   . Osteoarthritis Maternal Grandmother   . Diabetes Maternal Grandfather   . Cancer Paternal Grandfather        unknown  . Prostate cancer Neg Hx   . Bladder Cancer Neg Hx   . Kidney cancer Neg Hx       Current Outpatient Medications:  .  Ascorbic Acid (VITAMIN C) 1000 MG tablet, Take 1,000 mg by mouth daily., Disp: , Rfl:  .  aspirin EC 81 MG tablet, Take 81 mg by mouth daily., Disp: , Rfl:  .  B Complex Vitamins (VITAMIN B COMPLEX PO), Take by mouth., Disp: , Rfl:  .  Bacillus Coagulans-Inulin (PROBIOTIC) 1-250 BILLION-MG CAPS, Take by mouth., Disp: , Rfl:  .  calcium carbonate (CALCIUM 600) 600 MG TABS tablet, Take by mouth. Take 1,200 mg by mouth.,  Disp: , Rfl:  .  cephALEXin (KEFLEX) 500 MG capsule, Take 500 mg by mouth as needed., Disp: , Rfl:  .  CRANBERRY PO, Take by mouth., Disp: , Rfl:  .  diclofenac sodium (VOLTAREN) 1 % GEL, Apply 4 g topically 4 (four) times daily., Disp: 1 Tube, Rfl: 3 .  ibuprofen (ADVIL,MOTRIN) 200 MG tablet, Take 600 mg by mouth 2 (two) times daily. prn, Disp: , Rfl:  .  letrozole (FEMARA) 2.5 MG tablet, TAKE ONE TABLET BY MOUTH ONCE DAILY, Disp: 30 tablet, Rfl: 0 .  meclizine (ANTIVERT) 25 MG tablet, Take 1 tablet (25 mg total) by mouth 3 (three) times daily as needed., Disp: 90  tablet, Rfl: 0 .  naproxen sodium (ALEVE) 220 MG tablet, Take 220 mg by mouth as needed., Disp: , Rfl:  .  simvastatin (ZOCOR) 40 MG tablet, Take 1 tablet (40 mg total) by mouth at bedtime., Disp: 90 tablet, Rfl: 3  EXAM:  VITALS per patient if applicable:  GENERAL: alert, oriented, appears well and in no acute distress  HEENT: atraumatic, conjunttiva clear, no obvious abnormalities on inspection of external nose and ears  NECK: normal movements of the head and neck  LUNGS: on inspection no signs of respiratory distress, breathing rate appears normal, no obvious gross SOB, gasping or wheezing  CV: no obvious cyanosis  MS: moves all visible extremities without noticeable abnormality  PSYCH/NEURO: pleasant and cooperative, no obvious depression or anxiety, speech and thought processing grossly intact  ASSESSMENT AND PLAN:  Discussed the following assessment and plan:  Mixed hyperlipidemia - Plan: Lipid panel  Abdominal bloating  Chronic midline thoracic back pain - Plan: Ambulatory referral to Orthopedic Surgery  Paroxysmal tachycardia (HCC)  Lumbar pain Problem List Items Addressed This Visit      Cardiovascular and Mediastinum   Paroxysmal tachycardia (HCC)    Resolved. Will follow Of note, I also advised to discontinue ASA in the next year for primary prevention as I explained that with age increases risk of GI bleeding on ASA. She will consider this at next visit.         Other   Abdominal bloating    Etiology nonspecific at this time. Differentials include atypical presentation of GERD, bile acid malabsorption after cholecystectomy, food intolerance ( ?gluten, lactose). She will trial pepcid OTC and keep a journal of episodes and situation around. She declines further work up, labs, imaging at this time.       Hyperlipidemia - Primary    Suspect improved. Pending lipid panel.      Relevant Orders   Lipid panel   Lumbar pain    Chronic. Discussed recent xrays  again and agreed consult with orthopedics appropriate. Referral placed.       Relevant Medications   naproxen sodium (ALEVE) 220 MG tablet   Midline thoracic back pain   Relevant Medications   naproxen sodium (ALEVE) 220 MG tablet   Other Relevant Orders   Ambulatory referral to Orthopedic Surgery      -we discussed possible serious and likely etiologies, options for evaluation and workup, limitations of telemedicine visit vs in person visit, treatment, treatment risks and precautions. Pt prefers to treat via telemedicine empirically rather then risking or undertaking an in person visit at this moment.   I discussed the assessment and treatment plan with the patient. The patient was provided an opportunity to ask questions and all were answered. The patient agreed with the plan and demonstrated an understanding of the instructions.  The patient was advised to call back or seek an in-person evaluation if the symptoms worsen or if the condition fails to improve as anticipated.   Mable Paris, FNP

## 2020-03-06 NOTE — Progress Notes (Signed)
Done

## 2020-03-12 ENCOUNTER — Other Ambulatory Visit: Payer: Self-pay

## 2020-03-12 ENCOUNTER — Other Ambulatory Visit: Payer: Medicare Other

## 2020-03-12 ENCOUNTER — Ambulatory Visit (INDEPENDENT_AMBULATORY_CARE_PROVIDER_SITE_OTHER): Payer: Medicare Other

## 2020-03-12 DIAGNOSIS — E782 Mixed hyperlipidemia: Secondary | ICD-10-CM

## 2020-03-12 DIAGNOSIS — Z23 Encounter for immunization: Secondary | ICD-10-CM | POA: Diagnosis not present

## 2020-03-12 LAB — LIPID PANEL
Cholesterol: 144 mg/dL (ref 0–200)
HDL: 59.4 mg/dL (ref >39.00)
LDL Cholesterol: 69 mg/dL (ref 0–99)
NonHDL: 84.89
Total CHOL/HDL Ratio: 2
Triglycerides: 77 mg/dL (ref 0.0–149.0)
VLDL: 15.4 mg/dL (ref 0.0–40.0)

## 2020-03-14 ENCOUNTER — Other Ambulatory Visit: Payer: Self-pay

## 2020-03-14 ENCOUNTER — Ambulatory Visit (INDEPENDENT_AMBULATORY_CARE_PROVIDER_SITE_OTHER): Payer: Medicare Other

## 2020-03-14 DIAGNOSIS — Z111 Encounter for screening for respiratory tuberculosis: Secondary | ICD-10-CM | POA: Diagnosis not present

## 2020-03-14 NOTE — Progress Notes (Signed)
Patient presented for PPD Placement to right forearm, patient voiced no concerns nor showed any signs of distress during injection.

## 2020-03-16 ENCOUNTER — Ambulatory Visit: Payer: Medicare Other

## 2020-03-16 ENCOUNTER — Other Ambulatory Visit: Payer: Self-pay

## 2020-03-16 ENCOUNTER — Ambulatory Visit: Payer: Medicare Other | Admitting: Urology

## 2020-03-16 DIAGNOSIS — Z111 Encounter for screening for respiratory tuberculosis: Secondary | ICD-10-CM

## 2020-03-16 LAB — TB SKIN TEST
Induration: 0 mm
TB Skin Test: NEGATIVE

## 2020-03-16 NOTE — Progress Notes (Signed)
Patient came in today for negative PPD read. Patient's induration was <59mm.

## 2020-03-26 DIAGNOSIS — M47816 Spondylosis without myelopathy or radiculopathy, lumbar region: Secondary | ICD-10-CM | POA: Diagnosis not present

## 2020-03-26 DIAGNOSIS — M5136 Other intervertebral disc degeneration, lumbar region: Secondary | ICD-10-CM | POA: Diagnosis not present

## 2020-03-27 DIAGNOSIS — M8589 Other specified disorders of bone density and structure, multiple sites: Secondary | ICD-10-CM | POA: Diagnosis not present

## 2020-03-27 DIAGNOSIS — Z79811 Long term (current) use of aromatase inhibitors: Secondary | ICD-10-CM | POA: Diagnosis not present

## 2020-03-30 DIAGNOSIS — Z1231 Encounter for screening mammogram for malignant neoplasm of breast: Secondary | ICD-10-CM | POA: Diagnosis not present

## 2020-03-30 DIAGNOSIS — M8589 Other specified disorders of bone density and structure, multiple sites: Secondary | ICD-10-CM | POA: Diagnosis not present

## 2020-03-30 DIAGNOSIS — Z923 Personal history of irradiation: Secondary | ICD-10-CM | POA: Diagnosis not present

## 2020-03-30 DIAGNOSIS — Z170 Estrogen receptor positive status [ER+]: Secondary | ICD-10-CM | POA: Diagnosis not present

## 2020-03-30 DIAGNOSIS — C50312 Malignant neoplasm of lower-inner quadrant of left female breast: Secondary | ICD-10-CM | POA: Diagnosis not present

## 2020-04-03 ENCOUNTER — Encounter: Payer: Self-pay | Admitting: Family

## 2020-04-04 NOTE — Telephone Encounter (Signed)
I have spoken with patient & made her an appointment for 04/17/20 at 11:30 per Joycelyn Schmid. [facility] requested a pap smear for patient & patient wanted to discuss ongoing GI issues. She has eliminated dairy & tried going gluten free, but nothing helps. Her episodes are random & they can last days/weeks then go away for weeks/ months. Pt just wants to discuss & figure out who she needs to see.

## 2020-04-10 ENCOUNTER — Telehealth: Payer: Self-pay | Admitting: Urology

## 2020-04-10 NOTE — Telephone Encounter (Signed)
Patient called the office today requesting an appointment for possible UTI.  She has been drinking lots of water and cranberry juice, but not getting better.   Added the patient to Shannon's schedule on 04/11/20.

## 2020-04-11 ENCOUNTER — Ambulatory Visit (INDEPENDENT_AMBULATORY_CARE_PROVIDER_SITE_OTHER): Payer: Medicare Other | Admitting: Urology

## 2020-04-11 ENCOUNTER — Encounter: Payer: Self-pay | Admitting: Urology

## 2020-04-11 ENCOUNTER — Other Ambulatory Visit: Payer: Self-pay

## 2020-04-11 VITALS — BP 113/75 | HR 80 | Ht 71.0 in | Wt 183.9 lb

## 2020-04-11 DIAGNOSIS — R3129 Other microscopic hematuria: Secondary | ICD-10-CM

## 2020-04-11 DIAGNOSIS — N390 Urinary tract infection, site not specified: Secondary | ICD-10-CM

## 2020-04-11 NOTE — Patient Instructions (Signed)
We will call you with your results.

## 2020-04-11 NOTE — Progress Notes (Signed)
11/29/19 1:22 PM   Deborah Mcconnell [birth year] [id number]  Referring provider: Burnard Hawthorne, FNP [address] [place],   [address] Chief Complaint  Patient presents with  . Recurrent UTI    HPI: Deborah Mcconnell is a 69 y.o.female with recurrent UTI's and high risk hematuria who presents today for possible UTI.    History of recurrent UTI's Urine culture             10/21/2016 Variable resistant E.Coli             02/03/2017  Pan-sensitive E. Coli             02/24/2017 Pan-sensitive E. Coli  Currently on post-coital Keflex - once to twice weekly Not a candidate for vaginal estrogen cream due to history of breast cancer Patient has increased her water intake and is taking cranberry tablets.    She states that for the last 3 weeks she has been experiencing dysuria and urgency.  She had first tried the over-the-counter preventative treatments, but was she was unsuccessful in alleviating her symptoms.  She then doubled up on her Keflex and that was also unsuccessful in alleviating her symptoms.    Patient denies any modifying or aggravating factors.  Patient denies any gross hematuria or flank pain.  Patient denies any fevers, chills, nausea or vomiting.   Her UA is yellow cloudy, nitrite negative, greater than 30 WBCs, 3-10 RBCs and many bacteria.  High risk hematuria Nonsmoker.   Patient has had several hematuria workups in the past with no worrisome findings.    Most recent hematuria was then May 2018.  CTU Bilateral parapelvic kidney cyst.  Cysto with Dr. Pilar Jarvis NED.  She does not report any gross hematuria at this time.  Her UA is positive for micro heme today, but it is suspicious for infection.    PMH: Past Medical History:  Diagnosis Date  . Arthritis    osteoarthritis  . Breast cancer, left ([place]) 2014   Hx Lumpectomy and Rad tx's.  Marland Kitchen Hyperlipidemia   . Osteopenia    previously taking Evista and Actonel  . Osteoporosis   . Vertigo      Surgical History: Past Surgical History:  Procedure Laterality Date  . CHOLECYSTECTOMY    . lt breast lumpectomy  2014    Home Medications:  Current Outpatient Medications on File Prior to Visit  Medication Sig Dispense Refill  . Ascorbic Acid (VITAMIN C) 1000 MG tablet Take 1,000 mg by mouth daily.    Marland Kitchen aspirin EC 81 MG tablet Take 81 mg by mouth daily.    . B Complex Vitamins (VITAMIN B COMPLEX PO) Take by mouth.    . Bacillus Coagulans-Inulin (PROBIOTIC) 1-250 BILLION-MG CAPS Take by mouth.    . calcium carbonate (CALCIUM 600) 600 MG TABS tablet Take by mouth. Take 1,200 mg by mouth.    . cephALEXin (KEFLEX) 500 MG capsule Take 500 mg by mouth as needed.    Marland Kitchen CRANBERRY PO Take by mouth.    . diclofenac sodium (VOLTAREN) 1 % GEL Apply 4 g topically 4 (four) times daily. 1 Tube 3  . ibuprofen (ADVIL,MOTRIN) 200 MG tablet Take 600 mg by mouth 2 (two) times daily. prn    . letrozole (FEMARA) 2.5 MG tablet TAKE ONE TABLET BY MOUTH ONCE DAILY 30 tablet 0  . meclizine (ANTIVERT) 25 MG tablet Take 1 tablet (25 mg total) by mouth 3 (three) times daily as needed. 90 tablet  0  . naproxen sodium (ALEVE) 220 MG tablet Take 220 mg by mouth as needed.    . simvastatin (ZOCOR) 40 MG tablet Take 1 tablet (40 mg total) by mouth at bedtime. 90 tablet 3   No current facility-administered medications on file prior to visit.    Allergies:  Allergies  Allergen Reactions  . Ciprofloxacin Anaphylaxis    Causes death  . Ofloxacin Anaphylaxis    Causes Death  . Quinolones Anaphylaxis  . Actonel [Risedronate]     Stomach pain  . Augmentin [Amoxicillin-Pot Clavulanate] Nausea And Vomiting  . Bactrim [Sulfamethoxazole-Trimethoprim] Hives    Itching, hives, and GI distress    Family History: Family History  Problem Relation Age of Onset  . Osteoporosis Mother   . Hypertension Mother   . Osteoarthritis Mother   . Osteoporosis Sister   . Osteoarthritis Sister   . Osteoarthritis Maternal  Grandmother   . Diabetes Maternal Grandfather   . Cancer Paternal Grandfather        unknown  . Prostate cancer Neg Hx   . Bladder Cancer Neg Hx   . Kidney cancer Neg Hx     Social History:  reports that she has never smoked. She has never used smokeless tobacco. She reports current alcohol use. She reports that she does not use drugs.  ROS For pertinent review of systems please refer to history of present illness  Physical Exam: BP 113/75 (BP Location: Left Arm, Patient Position: Sitting, Cuff Size: Normal)   Pulse 80   Ht 5\' 11"  (1.803 m)   Wt 183 lb 14.4 oz (83.4 kg)   BMI 25.65 kg/m   Constitutional:  Well nourished. Alert and oriented, No acute distress. HEENT: [place] AT, mask in place.  Trachea midline Cardiovascular: No clubbing, cyanosis, or edema. Respiratory: Normal respiratory effort, no increased work of breathing. Neurologic: Grossly intact, no focal deficits, moving all 4 extremities. Psychiatric: Normal mood and affect.   Laboratory Data: Lab Results  Component Value Date   CREATININE 0.69 10/11/2019    Lab Results  Component Value Date   HGBA1C 5.6 09/21/2017    Urinalysis Component     Latest Ref Rng & Units 04/11/2020  Specific Gravity, UA     1.005 - 1.030 1.025  pH, UA     5.0 - 7.5 5.0  Color, UA     Yellow Yellow  Appearance Ur     Clear Cloudy (A)  Leukocytes,UA     Negative 1+ (A)  Protein,UA     Negative/Trace Negative  Glucose, UA     Negative Negative  Ketones, UA     Negative Negative  RBC, UA     Negative 2+ (A)  Bilirubin, UA     Negative Negative  Urobilinogen, Ur     0.2 - 1.0 mg/dL 0.2  Nitrite, UA     Negative Negative  Microscopic Examination      See below:   Component     Latest Ref Rng & Units 04/11/2020  WBC, UA     0 - 5 /hpf >30 (A)  RBC     0 - 2 /hpf 3-10 (A)  Epithelial Cells (non renal)     0 - 10 /hpf 0-10  Bacteria, UA     None seen/Few Many (A)   I have reviewed the labs.   Pertinent  Imaging: Results for MARAYAH, HIGDON "[facility]" (MRN [id number]) as of 11/29/2019 08:47  Ref. Range 11/29/2019 08:37  Scan Result Unknown 37  Assessment & Plan:    1. Recurrent UTIs Will continue to have good fluid intake Will continue to take cranberry pills Today's UA is suspicious for infection and will be sent for culture.  We discussed how to approach things in the interim and I thought it was best to wait on the urine culture results as she is already taken antibiotics without relief of her symptoms.  Hopefully her symptoms will not worsen prior to receiving urine culture results.  2.  High risk hematuria Today's UA is positive for micro heme, but it is suspicious for infection Once urine cultures are resulted and UTI is treated, we will recheck the urine to ensure the microscopic hematuria abates If microscopic hematuria persists even though she has been appropriately treated for the UTI or if the urine culture is negative, we will need to pursue a hematuria work-up   Zara Council, PA-C [place] [address] [place], [place] [address] [phone]

## 2020-04-12 LAB — URINALYSIS, COMPLETE
Bilirubin, UA: NEGATIVE
Glucose, UA: NEGATIVE
Ketones, UA: NEGATIVE
Nitrite, UA: NEGATIVE
Protein,UA: NEGATIVE
Specific Gravity, UA: 1.025 (ref 1.005–1.030)
Urobilinogen, Ur: 0.2 mg/dL (ref 0.2–1.0)
pH, UA: 5 (ref 5.0–7.5)

## 2020-04-12 LAB — MICROSCOPIC EXAMINATION: WBC, UA: >30 /hpf — AB (ref 0–5)

## 2020-04-13 ENCOUNTER — Telehealth: Payer: Self-pay | Admitting: Family Medicine

## 2020-04-13 NOTE — Telephone Encounter (Signed)
Patient notified and voiced understanding.

## 2020-04-13 NOTE — Telephone Encounter (Signed)
-----   Message from Nori Riis, PA-C sent at 04/13/2020 10:50 AM EDT ----- Please let Mrs. Oats know that her preliminary urine culture has indicated an infection with E. coli.  I have not yet received the sensitivities, so it would be best to wait on those before prescribing an antibiotic.  These results will likely not be available until Monday or Tuesday.

## 2020-04-14 LAB — CULTURE, URINE COMPREHENSIVE

## 2020-04-17 ENCOUNTER — Other Ambulatory Visit (HOSPITAL_COMMUNITY)
Admission: RE | Admit: 2020-04-17 | Discharge: 2020-04-17 | Disposition: A | Discharge status: Home / Self Care | Payer: Medicare Other | Source: Ambulatory Visit | Attending: Family | Admitting: Family

## 2020-04-17 ENCOUNTER — Ambulatory Visit (INDEPENDENT_AMBULATORY_CARE_PROVIDER_SITE_OTHER): Payer: Medicare Other | Admitting: Family

## 2020-04-17 ENCOUNTER — Telehealth: Payer: Self-pay | Admitting: Family Medicine

## 2020-04-17 ENCOUNTER — Encounter: Payer: Self-pay | Admitting: Family

## 2020-04-17 ENCOUNTER — Other Ambulatory Visit: Payer: Self-pay

## 2020-04-17 VITALS — BP 136/78 | HR 85 | Temp 97.7°F | Ht 71.0 in | Wt 184.0 lb

## 2020-04-17 DIAGNOSIS — Z1151 Encounter for screening for human papillomavirus (HPV): Secondary | ICD-10-CM | POA: Diagnosis not present

## 2020-04-17 DIAGNOSIS — Z124 Encounter for screening for malignant neoplasm of cervix: Secondary | ICD-10-CM

## 2020-04-17 DIAGNOSIS — R42 Dizziness and giddiness: Secondary | ICD-10-CM | POA: Diagnosis not present

## 2020-04-17 MED ORDER — VENLAFAXINE HCL ER 75 MG PO CP24: 75.0000 mg | ORAL_CAPSULE | Freq: Every day | ORAL | 0 refills | Status: DC

## 2020-04-17 MED ORDER — CEFUROXIME AXETIL 500 MG PO TABS: 500.0000 mg | ORAL_TABLET | Freq: Two times a day (BID) | ORAL | 0 refills | Status: DC

## 2020-04-17 NOTE — Telephone Encounter (Signed)
Patient notified and ABX was sent to pharmacy.

## 2020-04-17 NOTE — Telephone Encounter (Signed)
-----   Message from Nori Riis, PA-C sent at 04/15/2020  8:30 PM EDT ----- Please let Mrs. Balducci know that her urine culture was positive for infection and we need to start Ceftin 500 mg, BID x 7 days.  We will also need to recheck her urine in one month to make sure the micro heme resolves with the treatment of the infection.

## 2020-04-17 NOTE — Progress Notes (Signed)
Subjective:    Patient ID: Deborah Mcconnell, female    DOB: Jul 16, 1950, 69 y.o.   MRN: [id number]  CC: Deborah Mcconnell is a 69 y.o. female who presents today for follow up.   HPI: Complains of  episodes which consist of predictable pattern with  head starting to feel 'heavy' , proceeded by lower abdominal 'fullness' and then 'like clockwork' will have vertigo which starts hours later during the middle of the night.Episodes started 12 years ago. Episodes are more frequent, about every 6 to 8 weeks. Debilitating and after an episode takes 3-4 days to recover.   She is not sure if abdominal fullnnes is causing vertigo or vertigo causing abdominal cramping.   Last episode of 2 weeks ago, which lasted a full day in which she had to miss work. She can anticipate that an episode is coming.   She describes that she doenst have a  headache but 'head fills thick and heavy.' Stool is soft and brown.   No photophobia, phonophobia, aura, HA nausea, vomiting, vision changes, vision loss, facial numbness, fever, diarrhea, vomiting, weight loss, constipation.   Pattern is the same 'every time'. She has been told that she had silent migraine or abdominal migraine by neurology years ago.  Has seen ENT, Dr Richardson Landry most recently. She has been told she didn't have Menieres disease.   Eliminated dairy and gluten without change in symptoms.  Has thought before the eating cold cuts deli may cause.   Antivert isnt helpful for vertigo.  Questions whether anxiety and states the past 2 episodes she did have stressors at home.   She is also here for a pap smear and asked to schedule per breast cancer survivorship.    No abdominal distenstion, pelvic pain, vaginal pain or bleeding.    Trouble staying asleep. Every night wakes up at 330 am. Feels restored after sleep. No fatigue  h/o left breast cancer 2014 She has established with breast cancer survivorship   Due mammogram ( ordered by Herma Ard, NP);  dexa 02/2020, follows with [facility]  Colonoscopy utd. H/o cholecystectomy.  No caffeine use.    HISTORY:  Past Medical History:  Diagnosis Date  . Arthritis    osteoarthritis  . Breast cancer, left ([place]) 2014   Hx Lumpectomy and Rad tx's.  Marland Kitchen Hyperlipidemia   . Osteopenia    previously taking Evista and Actonel  . Osteoporosis   . Vertigo    Past Surgical History:  Procedure Laterality Date  . CHOLECYSTECTOMY    . lt breast lumpectomy  2014   Family History  Problem Relation Age of Onset  . Osteoporosis Mother   . Hypertension Mother   . Osteoarthritis Mother   . Osteoporosis Sister   . Osteoarthritis Sister   . Osteoarthritis Maternal Grandmother   . Diabetes Maternal Grandfather   . Cancer Paternal Grandfather        unknown  . Prostate cancer Neg Hx   . Bladder Cancer Neg Hx   . Kidney cancer Neg Hx     Allergies: Ciprofloxacin, Ofloxacin, Quinolones, Actonel [risedronate], Augmentin [amoxicillin-pot clavulanate], and Bactrim [sulfamethoxazole-trimethoprim] Current Outpatient Medications on File Prior to Visit  Medication Sig Dispense Refill  . Ascorbic Acid (VITAMIN C) 1000 MG tablet Take 1,000 mg by mouth daily.    Marland Kitchen aspirin EC 81 MG tablet Take 81 mg by mouth daily.    . B Complex Vitamins (VITAMIN B COMPLEX PO) Take by mouth.    . Bacillus Coagulans-Inulin (  PROBIOTIC) 1-250 BILLION-MG CAPS Take by mouth.    . calcium carbonate (CALCIUM 600) 600 MG TABS tablet Take by mouth. Take 1,200 mg by mouth.    . cephALEXin (KEFLEX) 500 MG capsule Take 500 mg by mouth as needed.    Marland Kitchen CRANBERRY PO Take by mouth.    . diclofenac sodium (VOLTAREN) 1 % GEL Apply 4 g topically 4 (four) times daily. 1 Tube 3  . fluticasone (FLONASE) 50 MCG/ACT nasal spray Place 1 spray into both nostrils daily.    Marland Kitchen ibuprofen (ADVIL,MOTRIN) 200 MG tablet Take 600 mg by mouth 2 (two) times daily. prn    . letrozole (FEMARA) 2.5 MG tablet TAKE ONE TABLET BY MOUTH ONCE DAILY 30 tablet 0    . meclizine (ANTIVERT) 25 MG tablet Take 1 tablet (25 mg total) by mouth 3 (three) times daily as needed. 90 tablet 0  . naproxen sodium (ALEVE) 220 MG tablet Take 220 mg by mouth as needed.    . simvastatin (ZOCOR) 40 MG tablet Take 1 tablet (40 mg total) by mouth at bedtime. 90 tablet 3   No current facility-administered medications on file prior to visit.    Social History   Tobacco Use  . Smoking status: Never Smoker  . Smokeless tobacco: Never Used  Vaping Use  . Vaping Use: Never used  Substance Use Topics  . Alcohol use: Yes    Alcohol/week: 0.0 standard drinks    Comment: rare  . Drug use: No    Review of Systems  Constitutional: Negative for chills and fever.  Eyes: Negative for visual disturbance.  Respiratory: Negative for cough and shortness of breath.   Cardiovascular: Negative for chest pain and palpitations.  Gastrointestinal: Positive for abdominal pain ('fullness'). Negative for abdominal distention, constipation, diarrhea, nausea and vomiting.  Genitourinary: Negative for pelvic pain and vaginal bleeding.  Neurological: Positive for dizziness (vertigo). Negative for light-headedness and headaches.  Psychiatric/Behavioral: Negative for sleep disturbance. The patient is nervous/anxious.       Objective:    BP 136/78   Pulse 85   Temp 97.7 F (36.5 C)   Ht 5\' 11"  (1.803 m)   Wt 184 lb (83.5 kg)   SpO2 98%   BMI 25.66 kg/m  BP Readings from Last 3 Encounters:  04/17/20 136/78  04/11/20 113/75  11/29/19 130/76   Wt Readings from Last 3 Encounters:  04/17/20 184 lb (83.5 kg)  04/11/20 183 lb 14.4 oz (83.4 kg)  03/02/20 185 lb (83.9 kg)    Physical Exam Vitals reviewed.  Constitutional:      Appearance: She is well-developed.  Eyes:     Conjunctiva/sclera: Conjunctivae normal.  Cardiovascular:     Rate and Rhythm: Normal rate and regular rhythm.     Pulses: Normal pulses.     Heart sounds: Normal heart sounds.  Pulmonary:     Effort:  Pulmonary effort is normal.     Breath sounds: Normal breath sounds. No wheezing, rhonchi or rales.  Skin:    General: Skin is warm and dry.  Neurological:     Mental Status: She is alert.  Psychiatric:        Speech: Speech normal.        Behavior: Behavior normal.        Thought Content: Thought content normal.        Assessment & Plan:   Problem List Items Addressed This Visit      Other   Screening for cervical cancer  Relevant Orders   Cytology - PAP   Vertigo - Primary    Atypical presentation if vertigo symptom of migraine. In particular she doesn't report a HA, she describes heaviness in her head. Question whether presentation of abdominal 'fullness' preceding vertigo is atypical migraine especially as she describes a premonition that episode is occurring and they have the same predictable pattern.  We agreed that anxiety may be playing a role. We opted to trial effexor for HA prophylaxis and treatment of GAD which may be contributing to and/or a result of debilitating episodes. Close follow up. Consider consult with neurology at follow up.       Relevant Medications   venlafaxine XR (EFFEXOR XR) 75 MG 24 hr capsule       I am having Deborah Curet "[name]" start on venlafaxine XR. I am also having her maintain her ibuprofen, calcium carbonate, letrozole, diclofenac sodium, vitamin C, CRANBERRY PO, B Complex Vitamins (VITAMIN B COMPLEX PO), meclizine, simvastatin, aspirin EC, Probiotic, cephALEXin, naproxen sodium, cefUROXime, and fluticasone.   Meds ordered this encounter  Medications  . venlafaxine XR (EFFEXOR XR) 75 MG 24 hr capsule    Sig: Take 1 capsule (75 mg total) by mouth daily with breakfast.    Dispense:  90 capsule    Refill:  0    Order Specific Question:   Supervising Provider    Answer:   Crecencio Mc [[id number]]    Return precautions given.   Risks, benefits, and alternatives of the medications and treatment plan prescribed today were  discussed, and patient expressed understanding.   Education regarding symptom management and diagnosis given to patient on AVS.  Continue to follow with Burnard Hawthorne, FNP for routine health maintenance.   Deborah Mcconnell and I agreed with plan.   Mable Paris, FNP  I have spent 35 minutes with a patient including discussion of symptoms, history of symptoms,  and discussion plan of care as well as options thereof.

## 2020-04-17 NOTE — Assessment & Plan Note (Addendum)
Atypical presentation if vertigo symptom of migraine. In particular she doesn't report a HA, she describes heaviness in her head. Question whether presentation of abdominal 'fullness' preceding vertigo is atypical migraine especially as she describes a premonition that episode is occurring and they have the same predictable pattern.  We agreed that anxiety may be playing a role. We opted to trial effexor for HA prophylaxis and treatment of GAD which may be contributing to and/or a result of debilitating episodes. Close follow up. Consider consult with neurology at follow up.

## 2020-04-17 NOTE — Patient Instructions (Signed)
Trial of effexor  Let me know how you are doing

## 2020-04-18 LAB — CYTOLOGY - PAP
Comment: NEGATIVE
Diagnosis: NEGATIVE
High risk HPV: NEGATIVE

## 2020-05-28 ENCOUNTER — Telehealth: Payer: Self-pay | Admitting: Urology

## 2020-05-28 NOTE — Telephone Encounter (Signed)
Patient notified and appointment has been made.  

## 2020-05-28 NOTE — Telephone Encounter (Signed)
Would you have Deborah Mcconnell come in for an urinalysis, so that we can check to make sure her microscopic hematuria cleared with the treatment of her UTI?

## 2020-05-30 ENCOUNTER — Other Ambulatory Visit: Payer: Self-pay | Admitting: Urology

## 2020-05-30 ENCOUNTER — Other Ambulatory Visit: Payer: Medicare Other

## 2020-05-30 ENCOUNTER — Other Ambulatory Visit: Payer: Self-pay

## 2020-05-30 DIAGNOSIS — R3129 Other microscopic hematuria: Secondary | ICD-10-CM

## 2020-05-30 NOTE — Progress Notes (Signed)
Order in for UA

## 2020-06-01 ENCOUNTER — Ambulatory Visit: Payer: Medicare Other | Admitting: Family

## 2020-06-01 LAB — MICROSCOPIC EXAMINATION: Bacteria, UA: NONE SEEN

## 2020-06-01 LAB — URINALYSIS, COMPLETE
Bilirubin, UA: NEGATIVE
Glucose, UA: NEGATIVE
Ketones, UA: NEGATIVE
Nitrite, UA: NEGATIVE
Protein,UA: NEGATIVE
RBC, UA: NEGATIVE
Specific Gravity, UA: 1.025 (ref 1.005–1.030)
Urobilinogen, Ur: 0.2 mg/dL (ref 0.2–1.0)
pH, UA: 5.5 (ref 5.0–7.5)

## 2020-06-04 ENCOUNTER — Telehealth: Payer: Self-pay | Admitting: Family Medicine

## 2020-06-04 NOTE — Telephone Encounter (Signed)
Patient notified and voiced understanding.

## 2020-06-04 NOTE — Telephone Encounter (Signed)
-----   Message from Nori Riis, PA-C sent at 06/04/2020  7:42 AM EST ----- Please let Mrs. Lippens know that her urine was clear of microscopic blood.

## 2020-07-16 ENCOUNTER — Encounter: Payer: Self-pay | Admitting: Family

## 2020-07-17 ENCOUNTER — Other Ambulatory Visit: Payer: Self-pay | Admitting: Family

## 2020-07-18 ENCOUNTER — Ambulatory Visit: Payer: Medicare Other | Admitting: Family

## 2020-08-06 DIAGNOSIS — B351 Tinea unguium: Secondary | ICD-10-CM | POA: Diagnosis not present

## 2020-08-06 DIAGNOSIS — D2261 Melanocytic nevi of right upper limb, including shoulder: Secondary | ICD-10-CM | POA: Diagnosis not present

## 2020-08-06 DIAGNOSIS — D2272 Melanocytic nevi of left lower limb, including hip: Secondary | ICD-10-CM | POA: Diagnosis not present

## 2020-08-06 DIAGNOSIS — D225 Melanocytic nevi of trunk: Secondary | ICD-10-CM | POA: Diagnosis not present

## 2020-08-06 DIAGNOSIS — X32XXXA Exposure to sunlight, initial encounter: Secondary | ICD-10-CM | POA: Diagnosis not present

## 2020-08-06 DIAGNOSIS — L814 Other melanin hyperpigmentation: Secondary | ICD-10-CM | POA: Diagnosis not present

## 2020-08-06 DIAGNOSIS — L718 Other rosacea: Secondary | ICD-10-CM | POA: Diagnosis not present

## 2020-08-06 DIAGNOSIS — D2262 Melanocytic nevi of left upper limb, including shoulder: Secondary | ICD-10-CM | POA: Diagnosis not present

## 2020-08-06 DIAGNOSIS — L821 Other seborrheic keratosis: Secondary | ICD-10-CM | POA: Diagnosis not present

## 2020-08-22 ENCOUNTER — Ambulatory Visit: Payer: Medicare Other

## 2020-08-22 ENCOUNTER — Ambulatory Visit (INDEPENDENT_AMBULATORY_CARE_PROVIDER_SITE_OTHER): Payer: Medicare Other

## 2020-08-22 VITALS — Ht 71.0 in | Wt 184.0 lb

## 2020-08-22 DIAGNOSIS — Z0000 Encounter for general adult medical examination without abnormal findings: Secondary | ICD-10-CM | POA: Diagnosis not present

## 2020-08-22 NOTE — Patient Instructions (Addendum)
Deborah Mcconnell , Thank you for taking time to come for your Medicare Wellness Visit. I appreciate your ongoing commitment to your health goals. Please review the following plan we discussed and let me know if I can assist you in the future.   These are the goals we discussed: Goals    . Follow up with Primary Care Provider     As needed       This is a list of the screening recommended for you and due dates:  Health Maintenance  Topic Date Due  . COVID-19 Vaccine (3 - Moderna risk 4-dose series) 10/02/2019  . Mammogram  03/04/2021  . Pap Smear  04/17/2021  . Colon Cancer Screening  07/24/2024  . Tetanus Vaccine  10/04/2024  . Flu Shot  Completed  . DEXA scan (bone density measurement)  Completed  .  Hepatitis C: One time screening is recommended by Center for Disease Control  (CDC) for  adults born from [birth year] through 1965.   Completed  . Pneumonia vaccines  Completed   Keep all routine maintenance appointments.   Follow up 12/25/20 @ 8:00.  Advanced directives: not yet completed.   Conditions/risks identified: none new.    Follow up in one year for your annual wellness visit.   Preventive Care 76 Years and Older, Female Preventive care refers to lifestyle choices and visits with your health care provider that can promote health and wellness. What does preventive care include?  A yearly physical exam. This is also called an annual well check.  Dental exams once or twice a year.  Routine eye exams. Ask your health care provider how often you should have your eyes checked.  Personal lifestyle choices, including:  Daily care of your teeth and gums.  Regular physical activity.  Eating a healthy diet.  Avoiding tobacco and drug use.  Limiting alcohol use.  Practicing safe sex.  Taking low-dose aspirin every day.  Taking vitamin and mineral supplements as recommended by your health care provider. What happens during an annual well check? The services and  screenings done by your health care provider during your annual well check will depend on your age, overall health, lifestyle risk factors, and family history of disease. Counseling  Your health care provider may ask you questions about your:  Alcohol use.  Tobacco use.  Drug use.  Emotional well-being.  Home and relationship well-being.  Sexual activity.  Eating habits.  History of falls.  Memory and ability to understand (cognition).  Work and work Statistician.  Reproductive health. Screening  You may have the following tests or measurements:  Height, weight, and BMI.  Blood pressure.  Lipid and cholesterol levels. These may be checked every 5 years, or more frequently if you are over 56 years old.  Skin check.  Lung cancer screening. You may have this screening every year starting at age 49 if you have a 30-pack-year history of smoking and currently smoke or have quit within the past 15 years.  Fecal occult blood test (FOBT) of the stool. You may have this test every year starting at age 29.  Flexible sigmoidoscopy or colonoscopy. You may have a sigmoidoscopy every 5 years or a colonoscopy every 10 years starting at age 27.  Hepatitis C blood test.  Hepatitis B blood test.  Sexually transmitted disease (STD) testing.  Diabetes screening. This is done by checking your blood sugar (glucose) after you have not eaten for a while (fasting). You may have this done every 1-3 years.  Bone density scan. This is done to screen for osteoporosis. You may have this done starting at age 43.  Mammogram. This may be done every 1-2 years. Talk to your health care provider about how often you should have regular mammograms. Talk with your health care provider about your test results, treatment options, and if necessary, the need for more tests. Vaccines  Your health care provider may recommend certain vaccines, such as:  Influenza vaccine. This is recommended every  year.  Tetanus, diphtheria, and acellular pertussis (Tdap, Td) vaccine. You may need a Td booster every 10 years.  Zoster vaccine. You may need this after age 53.  Pneumococcal 13-valent conjugate (PCV13) vaccine. One dose is recommended after age 74.  Pneumococcal polysaccharide (PPSV23) vaccine. One dose is recommended after age 70. Talk to your health care provider about which screenings and vaccines you need and how often you need them. This information is not intended to replace advice given to you by your health care provider. Make sure you discuss any questions you have with your health care provider. Document Released: 07/13/2015 Document Revised: 03/05/2016 Document Reviewed: 04/17/2015 Elsevier Interactive Patient Education  2017 [place] Prevention in the Home Falls can cause injuries. They can happen to people of all ages. There are many things you can do to make your home safe and to help prevent falls. What can I do on the outside of my home?  Regularly fix the edges of walkways and driveways and fix any cracks.  Remove anything that might make you trip as you walk through a door, such as a raised step or threshold.  Trim any bushes or trees on the path to your home.  Use bright outdoor lighting.  Clear any walking paths of anything that might make someone trip, such as rocks or tools.  Regularly check to see if handrails are loose or broken. Make sure that both sides of any steps have handrails.  Any raised decks and porches should have guardrails on the edges.  Have any leaves, snow, or ice cleared regularly.  Use sand or salt on walking paths during winter.  Clean up any spills in your garage right away. This includes oil or grease spills. What can I do in the bathroom?  Use night lights.  Install grab bars by the toilet and in the tub and shower. Do not use towel bars as grab bars.  Use non-skid mats or decals in the tub or shower.  If you  need to sit down in the shower, use a plastic, non-slip stool.  Keep the floor dry. Clean up any water that spills on the floor as soon as it happens.  Remove soap buildup in the tub or shower regularly.  Attach bath mats securely with double-sided non-slip rug tape.  Do not have throw rugs and other things on the floor that can make you trip. What can I do in the bedroom?  Use night lights.  Make sure that you have a light by your bed that is easy to reach.  Do not use any sheets or blankets that are too big for your bed. They should not hang down onto the floor.  Have a firm chair that has side arms. You can use this for support while you get dressed.  Do not have throw rugs and other things on the floor that can make you trip. What can I do in the kitchen?  Clean up any spills right away.  Avoid walking  on wet floors.  Keep items that you use a lot in easy-to-reach places.  If you need to reach something above you, use a strong step stool that has a grab bar.  Keep electrical cords out of the way.  Do not use floor polish or wax that makes floors slippery. If you must use wax, use non-skid floor wax.  Do not have throw rugs and other things on the floor that can make you trip. What can I do with my stairs?  Do not leave any items on the stairs.  Make sure that there are handrails on both sides of the stairs and use them. Fix handrails that are broken or loose. Make sure that handrails are as long as the stairways.  Check any carpeting to make sure that it is firmly attached to the stairs. Fix any carpet that is loose or worn.  Avoid having throw rugs at the top or bottom of the stairs. If you do have throw rugs, attach them to the floor with carpet tape.  Make sure that you have a light switch at the top of the stairs and the bottom of the stairs. If you do not have them, ask someone to add them for you. What else can I do to help prevent falls?  Wear shoes  that:  Do not have high heels.  Have rubber bottoms.  Are comfortable and fit you well.  Are closed at the toe. Do not wear sandals.  If you use a stepladder:  Make sure that it is fully opened. Do not climb a closed stepladder.  Make sure that both sides of the stepladder are locked into place.  Ask someone to hold it for you, if possible.  Clearly mark and make sure that you can see:  Any grab bars or handrails.  First and last steps.  Where the edge of each step is.  Use tools that help you move around (mobility aids) if they are needed. These include:  Canes.  Walkers.  Scooters.  Crutches.  Turn on the lights when you go into a dark area. Replace any light bulbs as soon as they burn out.  Set up your furniture so you have a clear path. Avoid moving your furniture around.  If any of your floors are uneven, fix them.  If there are any pets around you, be aware of where they are.  Review your medicines with your doctor. Some medicines can make you feel dizzy. This can increase your chance of falling. Ask your doctor what other things that you can do to help prevent falls. This information is not intended to replace advice given to you by your health care provider. Make sure you discuss any questions you have with your health care provider. Document Released: 04/12/2009 Document Revised: 11/22/2015 Document Reviewed: 07/21/2014 Elsevier Interactive Patient Education  2017 [employer].

## 2020-08-22 NOTE — Progress Notes (Cosign Needed)
Subjective:   Deborah Mcconnell is a 70 y.o. female who presents for Medicare Annual (Subsequent) preventive examination.  Review of Systems    No ROS.  Medicare Wellness Virtual Visit.    Cardiac Risk Factors include: advanced age (>34men, >28 women)     Objective:    Today's Vitals   08/22/20 1336  Weight: 184 lb (83.5 kg)  Height: 5\' 11"  (1.803 m)   Body mass index is 25.66 kg/m.  Advanced Directives 08/22/2020 08/22/2019 08/19/2018 07/25/2018 08/18/2017 08/15/2016 08/04/2016  Does Patient Have a Medical Advance Directive? Yes Yes Yes No Yes Yes Yes  Type of Paramedic of [place];Living will Living will;Healthcare Power of [place];Living will - [place];Living will [place];Living will [place];Living will  Does patient want to make changes to medical advance directive? No - Patient declined No - Patient declined No - Patient declined - No - Patient declined No - Patient declined No - Patient declined  Copy of [place] in Chart? No - copy requested No - copy requested No - copy requested - No - copy requested No - copy requested -  Would patient like information on creating a medical advance directive? - - - No - Patient declined - - -    Current Medications (verified) Outpatient Encounter Medications as of 08/22/2020  Medication Sig  . Ascorbic Acid (VITAMIN C) 1000 MG tablet Take 1,000 mg by mouth daily.  Marland Kitchen aspirin EC 81 MG tablet Take 81 mg by mouth daily.  . B Complex Vitamins (VITAMIN B COMPLEX PO) Take by mouth.  . Bacillus Coagulans-Inulin (PROBIOTIC) 1-250 BILLION-MG CAPS Take by mouth.  . calcium carbonate (CALCIUM 600) 600 MG TABS tablet Take by mouth. Take 1,200 mg by mouth.  . cephALEXin (KEFLEX) 500 MG capsule Take 500 mg by mouth as needed.  Marland Kitchen CRANBERRY PO Take by mouth.  . diclofenac sodium (VOLTAREN) 1 % GEL Apply 4 g topically 4  (four) times daily.  . fluticasone (FLONASE) 50 MCG/ACT nasal spray Place 1 spray into both nostrils daily.  Marland Kitchen ibuprofen (ADVIL,MOTRIN) 200 MG tablet Take 600 mg by mouth 2 (two) times daily. prn  . letrozole (FEMARA) 2.5 MG tablet TAKE ONE TABLET BY MOUTH ONCE DAILY  . meclizine (ANTIVERT) 25 MG tablet Take 1 tablet (25 mg total) by mouth 3 (three) times daily as needed.  . naproxen sodium (ALEVE) 220 MG tablet Take 220 mg by mouth as needed.  . simvastatin (ZOCOR) 40 MG tablet Take 1 tablet (40 mg total) by mouth at bedtime.  . [DISCONTINUED] cefUROXime (CEFTIN) 500 MG tablet Take 1 tablet (500 mg total) by mouth 2 (two) times daily with a meal.   No facility-administered encounter medications on file as of 08/22/2020.    Allergies (verified) Ciprofloxacin, Ofloxacin, Quinolones, Actonel [risedronate], Augmentin [amoxicillin-pot clavulanate], and Bactrim [sulfamethoxazole-trimethoprim]   History: Past Medical History:  Diagnosis Date  . Arthritis    osteoarthritis  . Breast cancer, left ([place]) 2014   Hx Lumpectomy and Rad tx's.  Marland Kitchen Hyperlipidemia   . Osteopenia    previously taking Evista and Actonel  . Osteoporosis   . Vertigo    Past Surgical History:  Procedure Laterality Date  . CHOLECYSTECTOMY    . lt breast lumpectomy  2014   Family History  Problem Relation Age of Onset  . Osteoporosis Mother   . Hypertension Mother   . Osteoarthritis Mother   .  Osteoporosis Sister   . Osteoarthritis Sister   . Osteoarthritis Maternal Grandmother   . Diabetes Maternal Grandfather   . Cancer Paternal Grandfather        unknown  . Prostate cancer Neg Hx   . Bladder Cancer Neg Hx   . Kidney cancer Neg Hx    Social History   Socioeconomic History  . Marital status: Married    Spouse name: Not on file  . Number of children: Not on file  . Years of education: Not on file  . Highest education level: Not on file  Occupational History  . Not on file  Tobacco Use  . Smoking  status: Never Smoker  . Smokeless tobacco: Never Used  Vaping Use  . Vaping Use: Never used  Substance and Sexual Activity  . Alcohol use: Yes    Alcohol/week: 0.0 standard drinks    Comment: rare  . Drug use: No  . Sexual activity: Yes  Other Topics Concern  . Not on file  Social History Narrative   'lindy'      Works at [employer] in [place].       Married.       Exercise- not excercising.          Social Determinants of Health   Financial Resource Strain: Low Risk   . Difficulty of Paying Living Expenses: Not hard at all  Food Insecurity: No Food Insecurity  . Worried About Charity fundraiser in the Last Year: Never true  . Ran Out of Food in the Last Year: Never true  Transportation Needs: No Transportation Needs  . Lack of Transportation (Medical): No  . Lack of Transportation (Non-Medical): No  Physical Activity: Sufficiently Active  . Days of Exercise per Week: 5 days  . Minutes of Exercise per Session: 30 min  Stress: No Stress Concern Present  . Feeling of Stress : Not at all  Social Connections: Socially Integrated  . Frequency of Communication with Friends and Family: More than three times a week  . Frequency of Social Gatherings with Friends and Family: More than three times a week  . Attends Religious Services: 1 to 4 times per year  . Active Member of Clubs or Organizations: Yes  . Attends Archivist Meetings: Not on file  . Marital Status: Married    Tobacco Counseling Counseling given: Not Answered   Clinical Intake:  Pre-visit preparation completed: Yes        Diabetes: No  How often do you need to have someone help you when you read instructions, pamphlets, or other written materials from your doctor or pharmacy?: 1 - Never    Interpreter Needed?: No      Activities of Daily Living In your present state of health, do you have any difficulty performing the following activities: 08/22/2020  Hearing? N   Vision? N  Difficulty concentrating or making decisions? N  Walking or climbing stairs? N  Comment Knee weakness. Paces self when climbing stairs.  Dressing or bathing? N  Doing errands, shopping? N  Preparing Food and eating ? N  Using the Toilet? N  In the past six months, have you accidently leaked urine? N  Do you have problems with loss of bowel control? N  Managing your Medications? N  Managing your Finances? N  Housekeeping or managing your Housekeeping? N  Some recent data might be hidden    Patient Care Team: Burnard Hawthorne, FNP as PCP - General (Family Medicine)  Indicate any recent Medical Services you may have received from other than [facility] providers in the past year (date may be approximate).     Assessment:   This is a routine wellness examination for [place].  I connected with Tenna today by telephone and verified that I am speaking with the correct person using two identifiers. Location patient: home Location provider: work Persons participating in the virtual visit: patient, Marine scientist.    I discussed the limitations, risks, security and privacy concerns of performing an evaluation and management service by telephone and the availability of in person appointments. The patient expressed understanding and verbally consented to this telephonic visit.    Interactive audio and video telecommunications were attempted between this provider and patient, however failed, due to patient having technical difficulties OR patient did not have access to video capability.  We continued and completed visit with audio only.  Some vital signs may be absent or patient reported.   Hearing/Vision screen  Hearing Screening   125Hz  250Hz  500Hz  1000Hz  2000Hz  3000Hz  4000Hz  6000Hz  8000Hz   Right ear:           Left ear:           Comments: Patient is able to hear conversational tones without difficulty. No issues reported.  Vision Screening Comments: Followed by [facility]  Cataracts  extracted, bilateral Virtual visit  Dietary issues and exercise activities discussed: Current Exercise Habits: Home exercise routine, Intensity: Mild  Healthy diet Good water intake  Goals    . Follow up with Primary Care Provider     As needed      Depression Screen PHQ 2/9 Scores 08/22/2020 08/22/2019 04/18/2019 12/06/2018 08/19/2018 08/18/2017 02/03/2017  PHQ - 2 Score 0 0 0 0 0 0 0  PHQ- 9 Score - - 0 0 - - -    Fall Risk Fall Risk  08/22/2020 08/22/2019 04/18/2019 08/19/2018 08/18/2017  Falls in the past year? 0 0 0 1 No  Number falls in past yr: 0 - 0 0 -  Injury with Fall? 0 - 0 1 -  Comment - - - Followed by ortho and pcp. Broken R wrist. She fell from a ladder. 3 weeks ago.  -  Follow up Falls evaluation completed Falls evaluation completed Falls evaluation completed - -    FALL RISK PREVENTION PERTAINING TO THE HOME: Handrails in use when climbing stairs? Yes Home free of loose throw rugs in walkways, pet beds, electrical cords, etc? Yes  Adequate lighting in your home to reduce risk of falls? Yes   ASSISTIVE DEVICES UTILIZED TO PREVENT FALLS: Use of a cane, walker or w/c? No   TIMED UP AND GO: Was the test performed? No . Virtual visit.   Cognitive Function:  Patient is alert and oriented x3.  Denies difficulty focusing, making decisions, memory loss.  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 MMSE/6CIT deferred. Normal by direct communication/observation.    MMSE - Mini Mental State Exam 08/19/2018 08/18/2017 08/15/2016  Orientation to time 5 5 5   Orientation to Place 5 5 5   Registration 3 3 3   Attention/ Calculation 5 5 5   Recall 3 3 3   Language- name 2 objects 2 2 2   Language- repeat 1 1 1   Language- follow 3 step command 3 3 3   Language- read & follow direction 1 1 1   Write a sentence 1 1 1   Copy design 1 1 1   Total score 30 30 30      6CIT  Screen 08/22/2019 08/15/2016  What Year? 0 points 0 points  What month? 0 points 0 points   What time? 0 points 0 points  Count back from 20 0 points 0 points  Months in reverse 0 points 0 points  Repeat phrase 0 points 0 points  Total Score 0 0    Immunizations Immunization History  Administered Date(s) Administered  . Fluad Quad(high Dose 65+) 04/26/2019, 03/12/2020  . Influenza Whole 04/13/2013  . Influenza, High Dose Seasonal PF 02/27/2016, 04/17/2017, 03/31/2018  . Influenza,inj,Quad PF,6+ Mos 06/13/2015  . Influenza-Unspecified 04/03/2014, 06/13/2015  . Moderna Sars-Covid-2 Vaccination 08/07/2019, 09/04/2019  . PPD Test 03/14/2020  . Pneumococcal Conjugate-13 09/10/2015  . Pneumococcal Polysaccharide-23 04/28/2012, 08/18/2017  . Tdap 10/05/2014  . Zoster 05/08/2014   Health Maintenance Health Maintenance  Topic Date Due  . COVID-19 Vaccine (3 - Moderna risk 4-dose series) 10/02/2019  . MAMMOGRAM  03/04/2021  . PAP SMEAR-Modifier  04/17/2021  . COLONOSCOPY (Pts 45-40yrs Insurance coverage will need to be confirmed)  07/24/2024  . TETANUS/TDAP  10/04/2024  . INFLUENZA VACCINE  Completed  . DEXA SCAN  Completed  . Hepatitis C Screening  Completed  . PNA vac Low Risk Adult  Completed   Colorectal cancer screening: Type of screening: Colonoscopy. Completed 07/24/14. Repeat every 10 years  Mammogram status: Completed 03/05/19. Repeat every year   Lung Cancer Screening: (Low Dose CT Chest recommended if Age 33-80 years, 30 pack-year currently smoking OR have quit w/in 15years.) does qualify.   Vision Screening: Recommended annual ophthalmology exams for early detection of glaucoma and other disorders of the eye. Is the patient up to date with their annual eye exam?  Yes   Dental Screening: Recommended annual dental exams for proper oral hygiene.  Community Resource Referral / Chronic Care Management: CRR required this visit?  No   CCM required this visit?  No      Plan:   Keep all routine maintenance appointments.   Follow up 12/25/20 @ 8:00  I have  personally reviewed and noted the following in the patient's chart:   . Medical and social history . Use of alcohol, tobacco or illicit drugs  . Current medications and supplements . Functional ability and status . Nutritional status . Physical activity . Advanced directives . List of other physicians . Hospitalizations, surgeries, and ER visits in previous 12 months . Vitals . Screenings to include cognitive, depression, and falls . Referrals and appointments  In addition, I have reviewed and discussed with patient certain preventive protocols, quality metrics, and best practice recommendations. A written personalized care plan for preventive services as well as general preventive health recommendations were provided to patient via mychart.     Varney Biles, LPN   3/50/0938     Agree with plan. Mable Paris, NP

## 2020-09-28 ENCOUNTER — Encounter: Payer: Self-pay | Admitting: Family

## 2020-10-01 ENCOUNTER — Other Ambulatory Visit: Payer: Self-pay

## 2020-10-01 ENCOUNTER — Other Ambulatory Visit: Payer: Self-pay | Admitting: Family

## 2020-10-01 MED ORDER — SIMVASTATIN 40 MG PO TABS
40.0000 mg | ORAL_TABLET | Freq: Every day | ORAL | 1 refills | Status: DC
Start: 2020-10-01 — End: 2021-04-10

## 2020-10-01 NOTE — Telephone Encounter (Signed)
Pt scheduled 4/13 at 3:30p.

## 2020-10-10 ENCOUNTER — Other Ambulatory Visit: Payer: Self-pay

## 2020-10-10 ENCOUNTER — Ambulatory Visit (INDEPENDENT_AMBULATORY_CARE_PROVIDER_SITE_OTHER): Payer: Medicare Other | Admitting: Family

## 2020-10-10 ENCOUNTER — Encounter: Payer: Self-pay | Admitting: Family

## 2020-10-10 DIAGNOSIS — C50912 Malignant neoplasm of unspecified site of left female breast: Secondary | ICD-10-CM | POA: Diagnosis not present

## 2020-10-10 DIAGNOSIS — B351 Tinea unguium: Secondary | ICD-10-CM | POA: Diagnosis not present

## 2020-10-10 DIAGNOSIS — E782 Mixed hyperlipidemia: Secondary | ICD-10-CM | POA: Diagnosis not present

## 2020-10-10 NOTE — Patient Instructions (Signed)
This is  Dr. Tullo's  example of a  "Low GI"  Diet:  It will allow you to lose 4 to 8  lbs  per month if you follow it carefully.  Your goal with exercise is a minimum of 30 minutes of aerobic exercise 5 days per week (Walking does not count once it becomes easy!)    All of the foods can be found at grocery stores and in bulk at [employer].  The Atkins protein bars and shakes are available in more varieties at Target, WalMart and Lowe's Foods.     7 AM Breakfast:  Choose from the following:  Low carbohydrate Protein  Shakes (I recommend the  Premier Protein chocolate shakes,  EAS AdvantEdge "Carb Control" shakes  Or the Atkins shakes all are under 3 net carbs)     a scrambled egg/bacon/cheese burrito made with Mission's "carb balance" whole wheat tortilla  (about 10 net carbs )  Jimmy Deans sells microwaveable frittata (basically a quiche without the pastry crust) that is eaten cold and very convenient way to get your eggs.  8 carbs)  If you make your own protein shakes, avoid bananas and pineapple,  And use low carb greek yogurt or original /unsweetened almond or soy milk    Avoid cereal and bananas, oatmeal and cream of wheat and grits. They are loaded with carbohydrates!   10 AM: high protein snack:  Protein bar by Atkins (the snack size, under 200 cal, usually < 6 net carbs).    A stick of cheese:  Around 1 carb,  100 cal     Dannon Light n Fit Greek Yogurt  (80 cal, 8 carbs)  Other so called "protein bars" and Greek yogurts tend to be loaded with carbohydrates.  Remember, in food advertising, the word "energy" is synonymous for " carbohydrate."  Lunch:   A Sandwich using the bread choices listed, Can use any  Eggs,  lunchmeat, grilled meat or canned tuna), avocado, regular mayo/mustard  and cheese.  A Salad using blue cheese, ranch,  Goddess or vinagrette,  Avoid taco shells, croutons or "confetti" and no "candied nuts" but regular nuts OK.   No pretzels, nabs  or chips.  Pickles and  miniature sweet peppers are a good low carb alternative that provide a "crunch"  The bread is the only source of carbohydrate in a sandwich and  can be decreased by trying some of the attached alternatives to traditional loaf bread   Avoid "Low fat dressings, as well as [place] and [place] dressings They are loaded with sugar!   3 PM/ Mid day  Snack:  Consider  1 ounce of  almonds, walnuts, pistachios, pecans, peanuts,  Macadamia nuts or a nut medley.  Avoid "granola and granola bars "  Mixed nuts are ok in moderation as long as there are no raisins,  cranberries or dried fruit.   KIND bars are OK if you get the low glycemic index variety   Try the prosciutto/mozzarella cheese sticks by Fiorruci  In deli /backery section   High protein      6 PM  Dinner:     Meat/fowl/fish with a green salad, and either broccoli, cauliflower, green beans, spinach, brussel sprouts or  Lima beans. DO NOT BREAD THE PROTEIN!!      There is a low carb pasta by Dreamfield's that is acceptable and tastes great: only 5 digestible carbs/serving.( All grocery stores but BJs carry it ) Several ready made meals are   available low carb:   Try Michel Angelo's chicken piccata or chicken or eggplant parm over low carb pasta.(Lowes and BJs)   Aaron Sanchez's "Carnitas" (pulled pork, no sauce,  0 carbs) or his beef pot roast to make a dinner burrito (at BJ's)  Pesto over low carb pasta (bj's sells a good quality pesto in the center refrigerated section of the deli   Try satueeing  Bok Choy with mushroooms as a good side   Green Giant makes a mashed cauliflower that tastes like mashed potatoes  Whole wheat pasta is still full of digestible carbs and  Not as low in glycemic index as Dreamfield's.   Brown rice is still rice,  So skip the rice and noodles if you eat Chinese or Thai (or at least limit to 1/2 cup)  9 PM snack :   Breyer's "low carb" fudgsicle or  ice cream bar (Carb Smart line), or  Weight Watcher's ice  cream bar , or another "no sugar added" ice cream;  a serving of fresh berries/cherries with whipped cream   Cheese or DANNON'S LlGHT N FIT GREEK YOGURT  8 ounces of [place] unsweetened almond/cococunut milk    Treat yourself to a parfait made with whipped cream blueberiies, walnuts and vanilla greek yogurt  Avoid bananas, pineapple, grapes  and watermelon on a regular basis because they are high in sugar.  THINK OF THEM AS DESSERT  Remember that snack Substitutions should be less than 10 NET carbs per serving and meals < 20 carbs. Remember to subtract fiber grams to get the "net carbs."   

## 2020-10-10 NOTE — Progress Notes (Signed)
Subjective:    Patient ID: Deborah Mcconnell, female    DOB: 1950/07/28, 70 y.o.   MRN: [id number]  CC: Deborah Mcconnell is a 70 y.o. female who presents today for follow up.   HPI: Feels well today She is concerned with yellow thickened toe nail beds of bilateral toes. She hasnt tried anything for this.   No vertigo in the last 4 months.   HLD- compliant with zocor  [facility] discontinued femara 03/2020 after 7 years   HISTORY:  Past Medical History:  Diagnosis Date  . Arthritis    osteoarthritis  . Breast cancer, left ([place]) 2014   Hx Lumpectomy and Rad tx's.  Marland Kitchen Hyperlipidemia   . Osteopenia    previously taking Evista and Actonel  . Osteoporosis   . Vertigo    Past Surgical History:  Procedure Laterality Date  . CHOLECYSTECTOMY    . lt breast lumpectomy  2014   Family History  Problem Relation Age of Onset  . Osteoporosis Mother   . Hypertension Mother   . Osteoarthritis Mother   . Osteoporosis Sister   . Osteoarthritis Sister   . Osteoarthritis Maternal Grandmother   . Diabetes Maternal Grandfather   . Cancer Paternal Grandfather        unknown  . Prostate cancer Neg Hx   . Bladder Cancer Neg Hx   . Kidney cancer Neg Hx     Allergies: Ciprofloxacin, Ofloxacin, Quinolones, Actonel [risedronate], Augmentin [amoxicillin-pot clavulanate], and Bactrim [sulfamethoxazole-trimethoprim] Current Outpatient Medications on File Prior to Visit  Medication Sig Dispense Refill  . Ascorbic Acid (VITAMIN C) 1000 MG tablet Take 1,000 mg by mouth daily.    Marland Kitchen aspirin EC 81 MG tablet Take 81 mg by mouth daily.    . B Complex Vitamins (VITAMIN B COMPLEX PO) Take by mouth.    . Bacillus Coagulans-Inulin (PROBIOTIC) 1-250 BILLION-MG CAPS Take by mouth.    . calcium carbonate (OS-CAL) 600 MG TABS tablet Take by mouth. Take 1,200 mg by mouth.    . cephALEXin (KEFLEX) 500 MG capsule Take 500 mg by mouth as needed.    Marland Kitchen CRANBERRY PO Take by mouth.    . diclofenac sodium  (VOLTAREN) 1 % GEL Apply 4 g topically 4 (four) times daily. 1 Tube 3  . fluticasone (FLONASE) 50 MCG/ACT nasal spray Place 1 spray into both nostrils daily.    Marland Kitchen ibuprofen (ADVIL,MOTRIN) 200 MG tablet Take 600 mg by mouth 2 (two) times daily. prn    . naproxen sodium (ALEVE) 220 MG tablet Take 220 mg by mouth as needed.    . simvastatin (ZOCOR) 40 MG tablet Take 1 tablet (40 mg total) by mouth at bedtime. 90 tablet 1   No current facility-administered medications on file prior to visit.    Social History   Tobacco Use  . Smoking status: Never Smoker  . Smokeless tobacco: Never Used  Vaping Use  . Vaping Use: Never used  Substance Use Topics  . Alcohol use: Yes    Alcohol/week: 0.0 standard drinks    Comment: rare  . Drug use: No    Review of Systems  Constitutional: Negative for chills and fever.  Respiratory: Negative for cough.   Cardiovascular: Negative for chest pain and palpitations.  Gastrointestinal: Negative for nausea and vomiting.  Skin: Negative for rash.      Objective:    BP 120/64   Pulse 75   Temp 97.6 F (36.4 C)   Ht 5\' 11"  (1.803  m)   Wt 191 lb 12.8 oz (87 kg)   SpO2 98%   BMI 26.75 kg/m  BP Readings from Last 3 Encounters:  10/10/20 120/64  04/17/20 136/78  04/11/20 113/75   Wt Readings from Last 3 Encounters:  10/10/20 191 lb 12.8 oz (87 kg)  08/22/20 184 lb (83.5 kg)  04/17/20 184 lb (83.5 kg)    Physical Exam Vitals reviewed.  Constitutional:      Appearance: She is well-developed.  Eyes:     Conjunctiva/sclera: Conjunctivae normal.  Cardiovascular:     Rate and Rhythm: Normal rate and regular rhythm.     Pulses: Normal pulses.     Heart sounds: Normal heart sounds.  Pulmonary:     Effort: Pulmonary effort is normal.     Breath sounds: Normal breath sounds. No wheezing, rhonchi or rales.  Skin:    General: Skin is warm and dry.     Comments: Bilateral yellow thickened toenails of first metatarsal.   Neurological:     Mental  Status: She is alert.  Psychiatric:        Speech: Speech normal.        Behavior: Behavior normal.        Thought Content: Thought content normal.        Assessment & Plan:   Problem List Items Addressed This Visit      Musculoskeletal and Integument   Onychomycosis    Discussed treatment with terbinafine , long therapy , monitoring of LFTs.  Patient was not interested in medication or seeing podiatry at this time. She will let me know if continues to bother her.         Other   Hyperlipidemia    Controlled. Continue zocor 40mg       Malignant neoplasm of breast (female) ([place])       I have discontinued Trinidad Curet "[name]"'s letrozole and meclizine. I am also having her maintain her ibuprofen, calcium carbonate, diclofenac sodium, vitamin C, CRANBERRY PO, B Complex Vitamins (VITAMIN B COMPLEX PO), aspirin EC, Probiotic, cephALEXin, naproxen sodium, fluticasone, and simvastatin.   No orders of the defined types were placed in this encounter.   Return precautions given.   Risks, benefits, and alternatives of the medications and treatment plan prescribed today were discussed, and patient expressed understanding.   Education regarding symptom management and diagnosis given to patient on AVS.  Continue to follow with Burnard Hawthorne, FNP for routine health maintenance.   Deborah Mcconnell and I agreed with plan.   Mable Paris, FNP

## 2020-10-11 DIAGNOSIS — B351 Tinea unguium: Secondary | ICD-10-CM | POA: Insufficient documentation

## 2020-10-11 NOTE — Assessment & Plan Note (Signed)
Discussed treatment with terbinafine , long therapy , monitoring of LFTs.  Patient was not interested in medication or seeing podiatry at this time. She will let me know if continues to bother her.

## 2020-10-11 NOTE — Assessment & Plan Note (Signed)
Controlled. Continue zocor 40mg 

## 2020-10-31 ENCOUNTER — Encounter: Payer: Self-pay | Admitting: Family

## 2020-11-01 ENCOUNTER — Other Ambulatory Visit: Payer: Self-pay

## 2020-11-01 MED ORDER — COVID-19 HOME COLLECTION TEST VI KIT: 2.0000 | PACK | Freq: Once | 0 refills | Status: AC

## 2020-11-05 ENCOUNTER — Encounter: Payer: Self-pay | Admitting: Adult Health

## 2020-11-05 ENCOUNTER — Telehealth (INDEPENDENT_AMBULATORY_CARE_PROVIDER_SITE_OTHER): Payer: Medicare Other | Admitting: Adult Health

## 2020-11-05 VITALS — Ht 70.98 in | Wt 187.0 lb

## 2020-11-05 DIAGNOSIS — R0981 Nasal congestion: Secondary | ICD-10-CM

## 2020-11-05 DIAGNOSIS — U071 COVID-19: Secondary | ICD-10-CM | POA: Diagnosis not present

## 2020-11-05 NOTE — Patient Instructions (Signed)

## 2020-11-05 NOTE — Progress Notes (Signed)
Virtual Visit via Video Note  I connected with Deborah Mcconnell on 11/05/20 at  2:30 PM EDT by a video enabled telemedicine application and verified that I am speaking with the correct person using two identifiers.  Parties involved in visit as below:   Location: Patient: at home  Provider: Provider: Provider's office at  [facility], [place].      I discussed the limitations of evaluation and management by telemedicine and the availability of in person appointments. The patient expressed understanding and agreed to proceed.  History of Present Illness: Patient is a 70 year old female in no acute distress who comes to the clinic by way of virtual medicine who tested positive for covid on 11/04/20 and has complaints of nasal congestion, sneezing, cough and body aches with chills. Onset 2 days ago.  " lots of sneezing" mild headache.  Temperature 99.2. Denies any higher readings.   Taking tylenol every morning and evening.   Chills and nausea have resolved . Husband had covid last week.  Patient  denies any fever, body aches,chills, rash, chest pain, shortness of breath, nausea, vomiting, or diarrhea.  Her husband got her an antihistamine to help with congestion.  Otherwise she feels well.   Observations/Objective:    Patient is alert and oriented and responsive to questions Engages in conversation with provider. Speaks in full sentences without any pauses without any shortness of breath or distress.   Assessment and Plan:   Discussed Mucinex and increase fluids. Nasal saline. Symptomatic treatment discussed. She can not take decongestant due to her heart palpitations- will avoid.    Follow Up Instructions:   Advised in person evaluation at anytime is advised if any symptoms do not improve, worsen or change at any given time.  Red Flags discussed. The patient was given clear instructions to go to ER or return to medical center if any red flags  develop, symptoms do not improve, worsen or new problems develop. They verbalized understanding.   I discussed the assessment and treatment plan with the patient. The patient was provided an opportunity to ask questions and all were answered. The patient agreed with the plan and demonstrated an understanding of the instructions.   The patient was advised to call back or seek an in-person evaluation if the symptoms worsen or if the condition fails to improve as anticipated.      Marcille Buffy, FNP

## 2020-11-27 ENCOUNTER — Encounter: Payer: Self-pay | Admitting: Urology

## 2020-11-27 ENCOUNTER — Ambulatory Visit (INDEPENDENT_AMBULATORY_CARE_PROVIDER_SITE_OTHER): Payer: Medicare Other | Admitting: Urology

## 2020-11-27 ENCOUNTER — Other Ambulatory Visit: Payer: Self-pay

## 2020-11-27 VITALS — BP 117/71 | HR 73 | Ht 70.98 in | Wt 185.0 lb

## 2020-11-27 DIAGNOSIS — R319 Hematuria, unspecified: Secondary | ICD-10-CM | POA: Diagnosis not present

## 2020-11-27 DIAGNOSIS — N390 Urinary tract infection, site not specified: Secondary | ICD-10-CM

## 2020-11-27 MED ORDER — HIBICLENS 4 % EX LIQD: Freq: Every day | CUTANEOUS | 0 refills | Status: DC | PRN

## 2020-11-27 MED ORDER — CEPHALEXIN 250 MG PO CAPS: ORAL_CAPSULE | ORAL | 1 refills | Status: DC

## 2020-11-27 NOTE — Progress Notes (Signed)
11/29/19 4:22 PM   Deborah Mcconnell 05-23-1951 [id number]  Referring provider: Burnard Hawthorne, FNP [address] [place],  [place] [address] Chief Complaint  Patient presents with  . Recurrent UTI   Urological history: 1. rUTI's -contributing factors of age and vaginal atrophy -documented positive urine cultures over the last year  Pansensitive E. coli on April 11, 2020  2. High risk hematuria -non-smoker -hematuria work up x 2 (2017, 2018)- bilateral parapelvic renal cysts -UA negative for micro heme  HPI: Deborah Mcconnell is a 70 y.o.female who presents today for urgency, malodorous urine and dysuria.   She states about 3 and half weeks ago her and her husband contracted COVID and while she was dealing with this, she started to have symptoms of an UTI with urgency and dysuria.  She increased her fluid intake and she took 3 days of Keflex 253 times daily in order to stave off her urinary tract symptoms.  They tempered but then they returned full force just prior to memorial weekend.  She also had AZO on hand and has been taking that as well.  Patient denies any modifying or aggravating factors.  Patient denies any gross hematuria or suprapubic/flank pain.  Patient denies any fevers, chills, nausea or vomiting.   UA > 30 WBC's and moderate bacteria.   PMH: Past Medical History:  Diagnosis Date  . Arthritis    osteoarthritis  . Breast cancer, left ([place]) 2014   Hx Lumpectomy and Rad tx's.  Marland Kitchen Hyperlipidemia   . Osteopenia    previously taking Evista and Actonel  . Osteoporosis   . Vertigo     Surgical History: Past Surgical History:  Procedure Laterality Date  . CHOLECYSTECTOMY    . lt breast lumpectomy  2014    Home Medications:  Current Outpatient Medications on File Prior to Visit  Medication Sig Dispense Refill  . Ascorbic Acid (VITAMIN C) 1000 MG tablet Take 1,000 mg by mouth daily.    Marland Kitchen aspirin EC 81 MG tablet Take 81 mg by mouth  daily.    . B Complex Vitamins (VITAMIN B COMPLEX PO) Take by mouth.    . Bacillus Coagulans-Inulin (PROBIOTIC) 1-250 BILLION-MG CAPS Take by mouth.    . calcium carbonate (OS-CAL) 600 MG TABS tablet Take by mouth. Take 1,200 mg by mouth.    . CRANBERRY PO Take by mouth.    . diclofenac sodium (VOLTAREN) 1 % GEL Apply 4 g topically 4 (four) times daily. 1 Tube 3  . fluticasone (FLONASE) 50 MCG/ACT nasal spray Place 1 spray into both nostrils daily.    Marland Kitchen ibuprofen (ADVIL,MOTRIN) 200 MG tablet Take 600 mg by mouth 2 (two) times daily. prn    . naproxen sodium (ALEVE) 220 MG tablet Take 220 mg by mouth as needed.    . simvastatin (ZOCOR) 40 MG tablet Take 1 tablet (40 mg total) by mouth at bedtime. 90 tablet 1   No current facility-administered medications on file prior to visit.    Allergies:  Allergies  Allergen Reactions  . Ciprofloxacin Anaphylaxis    Causes death  . Ofloxacin Anaphylaxis    Causes Death  . Quinolones Anaphylaxis  . Actonel [Risedronate]     Stomach pain  . Augmentin [Amoxicillin-Pot Clavulanate] Nausea And Vomiting  . Bactrim [Sulfamethoxazole-Trimethoprim] Hives    Itching, hives, and GI distress    Family History: Family History  Problem Relation Age of Onset  . Osteoporosis Mother   . Hypertension Mother   .  Osteoarthritis Mother   . Osteoporosis Sister   . Osteoarthritis Sister   . Osteoarthritis Maternal Grandmother   . Diabetes Maternal Grandfather   . Cancer Paternal Grandfather        unknown  . Prostate cancer Neg Hx   . Bladder Cancer Neg Hx   . Kidney cancer Neg Hx     Social History:  reports that she has never smoked. She has never used smokeless tobacco. She reports current alcohol use. She reports that she does not use drugs.  ROS For pertinent review of systems please refer to history of present illness  Physical Exam: BP 117/71   Pulse 73   Ht 5' 10.98" (1.803 m)   Wt 185 lb (83.9 kg)   BMI 25.82 kg/m   Constitutional:   Well nourished. Alert and oriented, No acute distress. HEENT: [place] AT, mask in place.  Trachea midline Cardiovascular: No clubbing, cyanosis, or edema. Respiratory: Normal respiratory effort, no increased work of breathing. Neurologic: Grossly intact, no focal deficits, moving all 4 extremities. Psychiatric: Normal mood and affect.   Laboratory Data: Urinalysis Component     Latest Ref Rng & Units 11/27/2020  Specific Gravity, UA     1.005 - 1.030 <1.005 (L)  pH, UA     5.0 - 7.5 5.0  Color, UA     Yellow Yellow  Appearance Ur     Clear Cloudy (A)  Leukocytes,UA     Negative 2+ (A)  Protein,UA     Negative/Trace Negative  Glucose, UA     Negative Negative  Ketones, UA     Negative Negative  RBC, UA     Negative Trace (A)  Bilirubin, UA     Negative Negative  Urobilinogen, Ur     0.2 - 1.0 mg/dL 0.2  Nitrite, UA     Negative Negative  Microscopic Examination      See below:   Component     Latest Ref Rng & Units 11/27/2020  WBC, UA     0 - 5 /hpf >30 (A)  RBC     0 - 2 /hpf 0-2  Epithelial Cells (non renal)     0 - 10 /hpf 0-10  Bacteria, UA     None seen/Few Moderate (A)  I have reviewed the labs.   Pertinent Imaging: No recent imaging   Assessment & Plan:    1. Recurrent UTIs -UA grossly infected -Urine sent for culture -She will take Keflex 250 mg 2 tablets in the morning and 2 tablets in the evening until culture and sensitivities are available and we will adjust if necessary -She will also continue AZO realizing she can only take it for 2 days at a time  2.  High risk hematuria -No reports of gross hematuria -UA negative for micro heme  Zara Council, PA-C [facility] [address] [place], [place] [address] [phone]

## 2020-11-28 ENCOUNTER — Ambulatory Visit: Payer: Self-pay | Admitting: Urology

## 2020-11-28 LAB — URINALYSIS, COMPLETE
Bilirubin, UA: NEGATIVE
Glucose, UA: NEGATIVE
Ketones, UA: NEGATIVE
Nitrite, UA: NEGATIVE
Protein,UA: NEGATIVE
Specific Gravity, UA: <1.005 — ABNORMAL LOW (ref 1.005–1.030)
Urobilinogen, Ur: 0.2 mg/dL (ref 0.2–1.0)
pH, UA: 5 (ref 5.0–7.5)

## 2020-11-28 LAB — MICROSCOPIC EXAMINATION: WBC, UA: >30 /hpf — AB (ref 0–5)

## 2020-11-29 LAB — CULTURE, URINE COMPREHENSIVE

## 2020-11-30 ENCOUNTER — Telehealth: Payer: Self-pay

## 2020-11-30 NOTE — Telephone Encounter (Signed)
-----   Message from Nori Riis, PA-C sent at 11/30/2020  8:24 AM EDT ----- Please let Deborah Mcconnell know that her urine culture was positive for infection and she can continue to take her Keflex 250 mg, two tablets in the morning and two tablets in the evening, for 7 days.

## 2020-11-30 NOTE — Telephone Encounter (Signed)
Patient notified

## 2020-11-30 NOTE — Telephone Encounter (Signed)
Left detailed message notifying patient of results. Mychart notification also sent

## 2020-12-25 ENCOUNTER — Ambulatory Visit: Payer: Medicare Other | Admitting: Family

## 2021-01-01 ENCOUNTER — Encounter: Payer: Self-pay | Admitting: Family

## 2021-01-09 ENCOUNTER — Ambulatory Visit (INDEPENDENT_AMBULATORY_CARE_PROVIDER_SITE_OTHER): Payer: Medicare Other

## 2021-01-09 ENCOUNTER — Other Ambulatory Visit: Payer: Self-pay

## 2021-01-09 ENCOUNTER — Ambulatory Visit (INDEPENDENT_AMBULATORY_CARE_PROVIDER_SITE_OTHER): Payer: Medicare Other | Admitting: Family

## 2021-01-09 ENCOUNTER — Encounter: Payer: Self-pay | Admitting: Family

## 2021-01-09 ENCOUNTER — Other Ambulatory Visit (HOSPITAL_COMMUNITY): Admission: RE | Admit: 2021-01-09 | Payer: Medicare Other | Source: Ambulatory Visit

## 2021-01-09 VITALS — BP 110/62 | HR 81 | Temp 97.8°F | Ht 70.98 in | Wt 189.2 lb

## 2021-01-09 DIAGNOSIS — M542 Cervicalgia: Secondary | ICD-10-CM

## 2021-01-09 LAB — COMPREHENSIVE METABOLIC PANEL
ALT: 16 U/L (ref 0–35)
AST: 21 U/L (ref 0–37)
Albumin: 4.3 g/dL (ref 3.5–5.2)
Alkaline Phosphatase: 64 U/L (ref 39–117)
BUN: 22 mg/dL (ref 6–23)
CO2: 30 mEq/L (ref 19–32)
Calcium: 9.8 mg/dL (ref 8.4–10.5)
Chloride: 105 mEq/L (ref 96–112)
Creatinine, Ser: 0.78 mg/dL (ref 0.40–1.20)
GFR: 76.96 mL/min (ref >60.00)
Glucose, Bld: 78 mg/dL (ref 70–99)
Potassium: 4.2 mEq/L (ref 3.5–5.1)
Sodium: 141 mEq/L (ref 135–145)
Total Bilirubin: 0.4 mg/dL (ref 0.2–1.2)
Total Protein: 6.7 g/dL (ref 6.0–8.3)

## 2021-01-09 LAB — CBC WITH DIFFERENTIAL/PLATELET
Basophils Absolute: 0 10*3/uL (ref 0.0–0.1)
Basophils Relative: 0.8 % (ref 0.0–3.0)
Eosinophils Absolute: 0.2 10*3/uL (ref 0.0–0.7)
Eosinophils Relative: 3.2 % (ref 0.0–5.0)
HCT: 43.1 % (ref 36.0–46.0)
Hemoglobin: 14.8 g/dL (ref 12.0–15.0)
Lymphocytes Relative: 21 % (ref 12.0–46.0)
Lymphs Abs: 1.2 10*3/uL (ref 0.7–4.0)
MCHC: 34.3 g/dL (ref 30.0–36.0)
MCV: 94.7 fl (ref 78.0–100.0)
Monocytes Absolute: 0.5 10*3/uL (ref 0.1–1.0)
Monocytes Relative: 8.9 % (ref 3.0–12.0)
Neutro Abs: 3.8 10*3/uL (ref 1.4–7.7)
Neutrophils Relative %: 66.1 % (ref 43.0–77.0)
Platelets: 242 10*3/uL (ref 150.0–400.0)
RBC: 4.55 Mil/uL (ref 3.87–5.11)
RDW: 13.4 % (ref 11.5–15.5)
WBC: 5.7 10*3/uL (ref 4.0–10.5)

## 2021-01-09 LAB — C-REACTIVE PROTEIN: CRP: <1 mg/dL (ref 0.5–20.0)

## 2021-01-09 MED ORDER — LIDOCAINE 5 % EX PTCH: 1.0000 | MEDICATED_PATCH | CUTANEOUS | 0 refills | Status: DC

## 2021-01-09 MED ORDER — MELOXICAM 7.5 MG PO TABS
7.5000 mg | ORAL_TABLET | Freq: Every day | ORAL | 1 refills | Status: DC | PRN
Start: 2021-01-09 — End: 2022-10-17

## 2021-01-09 MED ORDER — PREDNISONE 10 MG PO TABS: ORAL_TABLET | ORAL | 0 refills | Status: DC

## 2021-01-09 NOTE — Progress Notes (Signed)
Subjective:    Patient ID: Deborah Mcconnell, female    DOB: Sep 04, 1950, 70 y.o.   MRN: [id number]  CC: Deborah Mcconnell is a 70 y.o. female who presents today for follow up.   HPI: Complains of right upper tooth ache, 7 days, comes and goes.  She reports feeling 'dull tooth ache' . Pain is manageable and is not 'pain' . She has taken aleve BID with slight relief.  Onset one day after a ache in posterior neck.  She had HA during that time as well. HA has resolved. HA are seldom. She takes excredin for relief. HA are not a/w nausea, photophobia. HA awaken from sleep nor worse HA of life.   She has h/o root canal and worried at first related to tooth.  Tooth and ear pain totally resolve at night and first thing morning when supine.  3 days later ,pain moved to lower right tooth.  She has seen the dentist yesterday in which xrays and cleaning with entirely normal exam. Dentist offered antibiotic for possible early antibiotic; they opted to defer. Right ear ache started 4 days ago.   No ear discharge, vision changes, sob, cp, left arm numbness, arm weakness, fever, cough, joint swelling, .   No personal or family h/o RA.   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   She reports neck pain and questions whether related to arthritis.   She saw Dr Richardson Landry, ENT in 11/2018 2019.  Follows with [facility] for osteopenia.   DG lumbar and DG thoracic- Advanced multilevel lumbar spondylosis;Mild thoracic spine scoliosis. Diffuse degenerative change cervical and thoracic spine. HISTORY:  Past Medical History:  Diagnosis Date   Arthritis    osteoarthritis   Breast cancer, left ([place]) 2014   Hx Lumpectomy and Rad tx's.   Hyperlipidemia    Osteopenia    previously taking Evista and Actonel   Osteoporosis    Vertigo    Past Surgical History:  Procedure Laterality Date   CHOLECYSTECTOMY     lt breast lumpectomy  2014   Family  History  Problem Relation Age of Onset   Osteoporosis Mother    Hypertension Mother    Osteoarthritis Mother    Osteoporosis Sister    Osteoarthritis Sister    Osteoarthritis Maternal Grandmother    Diabetes Maternal Grandfather    Cancer Paternal Grandfather        unknown   Prostate cancer Neg Hx    Bladder Cancer Neg Hx    Kidney cancer Neg Hx     Allergies: Ciprofloxacin, Ofloxacin, Quinolones, Actonel [risedronate], Augmentin [amoxicillin-pot clavulanate], and Bactrim [sulfamethoxazole-trimethoprim] Current Outpatient Medications on File Prior to Visit  Medication Sig Dispense Refill   Ascorbic Acid (VITAMIN C) 1000 MG tablet Take 1,000 mg by mouth daily.     aspirin EC 81 MG tablet Take 81 mg by mouth daily.     B Complex Vitamins (VITAMIN B COMPLEX PO) Take by mouth.     Bacillus Coagulans-Inulin (PROBIOTIC) 1-250 BILLION-MG CAPS Take by mouth.     calcium carbonate (OS-CAL) 600 MG TABS tablet Take by mouth. Take 1,200 mg by mouth.     cephALEXin (KEFLEX) 250 MG capsule Take one after intercourse 90 capsule 1   chlorhexidine (HIBICLENS) 4 % external liquid Apply topically daily as needed. 120 mL 0   CRANBERRY PO Take by mouth.     diclofenac sodium (VOLTAREN) 1 %  GEL Apply 4 g topically 4 (four) times daily. 1 Tube 3   fluticasone (FLONASE) 50 MCG/ACT nasal spray Place 1 spray into both nostrils daily.     ibuprofen (ADVIL,MOTRIN) 200 MG tablet Take 600 mg by mouth 2 (two) times daily. prn     simvastatin (ZOCOR) 40 MG tablet Take 1 tablet (40 mg total) by mouth at bedtime. 90 tablet 1   No current facility-administered medications on file prior to visit.    Social History   Tobacco Use   Smoking status: Never   Smokeless tobacco: Never  Vaping Use   Vaping Use: Never used  Substance Use Topics   Alcohol use: Yes    Alcohol/week: 0.0 standard drinks    Comment: rare   Drug use: No    Review of Systems  Constitutional:  Negative for chills, fever and unexpected  weight change.  HENT:  Negative for congestion.   Respiratory:  Negative for cough.   Cardiovascular:  Negative for chest pain, palpitations and leg swelling.  Gastrointestinal:  Negative for nausea and vomiting.  Musculoskeletal:  Negative for arthralgias and myalgias.  Skin:  Negative for rash.  Neurological:  Negative for headaches.  Hematological:  Negative for adenopathy.  Psychiatric/Behavioral:  Negative for confusion.      Objective:    BP 110/62 (BP Location: Left Arm, Patient Position: Sitting, Cuff Size: Large)   Pulse 81   Temp 97.8 F (36.6 C) (Oral)   Ht 5' 10.98" (1.803 m)   Wt 189 lb 3.2 oz (85.8 kg)   SpO2 99%   BMI 26.40 kg/m  BP Readings from Last 3 Encounters:  01/09/21 110/62  11/27/20 117/71  10/10/20 120/64   Wt Readings from Last 3 Encounters:  01/09/21 189 lb 3.2 oz (85.8 kg)  11/27/20 185 lb (83.9 kg)  11/05/20 187 lb (84.8 kg)    Physical Exam Vitals reviewed.  Constitutional:      Appearance: She is well-developed.  HENT:     Head: Normocephalic and atraumatic.     Right Ear: Hearing, tympanic membrane, ear canal and external ear normal. No swelling or tenderness. No middle ear effusion. Tympanic membrane is not erythematous or bulging.     Left Ear: Tympanic membrane, ear canal and external ear normal. No swelling or tenderness.  No middle ear effusion. Tympanic membrane is not erythematous or bulging.     Nose: Nose normal. No rhinorrhea.     Right Sinus: No maxillary sinus tenderness or frontal sinus tenderness.     Left Sinus: No maxillary sinus tenderness or frontal sinus tenderness.     Mouth/Throat:     Pharynx: Uvula midline. No posterior oropharyngeal erythema.  Eyes:     General: Lids are normal. Lids are everted, no foreign bodies appreciated.     Conjunctiva/sclera: Conjunctivae normal.     Pupils: Pupils are equal, round, and reactive to light.     Comments: Normal fundus bilaterally  Cardiovascular:     Rate and Rhythm:  Normal rate and regular rhythm.     Pulses: Normal pulses.     Heart sounds: Normal heart sounds.  Pulmonary:     Effort: Pulmonary effort is normal.     Breath sounds: Normal breath sounds. No wheezing, rhonchi or rales.  Lymphadenopathy:     Head:     Right side of head: No submental, submandibular, tonsillar, preauricular, posterior auricular or occipital adenopathy.     Left side of head: No submental, submandibular, tonsillar, preauricular, posterior auricular  or occipital adenopathy.     Cervical: No cervical adenopathy.     Right cervical: No superficial, deep or posterior cervical adenopathy.    Left cervical: No superficial, deep or posterior cervical adenopathy.  Skin:    General: Skin is warm and dry.  Neurological:     Mental Status: She is alert.     Cranial Nerves: No cranial nerve deficit.     Sensory: No sensory deficit.     Deep Tendon Reflexes:     Reflex Scores:      Bicep reflexes are 2+ on the right side and 2+ on the left side.      Patellar reflexes are 2+ on the right side and 2+ on the left side.    Comments: Grip equal and strong bilateral upper extremities. Gait strong and steady.   Psychiatric:        Speech: Speech normal.        Behavior: Behavior normal.        Thought Content: Thought content normal.       Assessment & Plan:   Problem List Items Addressed This Visit       Other   Neck pain on right side - Primary    Reassuring Neurologic and HEENT exam.  She had thorough dental exam yesterday. She is well appearing, non toxic in appearance. Agreed that osteoarthritis may be exacerbating right sided ear, jaw pain. Interestingly pain is absent when supine. We agreed to start with XR, labs, trial of prednisone, mobic prn and orthopedic consult. Consider PT. Close followup.        Relevant Medications   predniSONE (DELTASONE) 10 MG tablet   meloxicam (MOBIC) 7.5 MG tablet   lidocaine (LIDODERM) 5 %   Other Relevant Orders   ANA   C-reactive  protein   CBC with Differential/Platelet   Comprehensive metabolic panel   DG Cervical Spine Complete   CYCLIC CITRUL PEPTIDE ANTIBODY, IGG/IGA   Rheumatoid factor   Ambulatory referral to Orthopedic Surgery     I have discontinued Trinidad Curet "[name]"'s naproxen sodium. I am also having her start on predniSONE, meloxicam, and lidocaine. Additionally, I am having her maintain her ibuprofen, calcium carbonate, diclofenac sodium, vitamin C, CRANBERRY PO, B Complex Vitamins (VITAMIN B COMPLEX PO), aspirin EC, Probiotic, fluticasone, simvastatin, cephALEXin, and Hibiclens.   Meds ordered this encounter  Medications   predniSONE (DELTASONE) 10 MG tablet    Sig: Take 4 tablets ( total 40 mg) by mouth for 2 days; take 3 tablets ( total 30 mg) by mouth for 2 days; take 2 tablets ( total 20 mg) by mouth for 1 day; take 1 tablet ( total 10 mg) by mouth for 1 day.    Dispense:  17 tablet    Refill:  0    Order Specific Question:   Supervising Provider    Answer:   Deborra Medina L [[id number]]   meloxicam (MOBIC) 7.5 MG tablet    Sig: Take 1 tablet (7.5 mg total) by mouth daily as needed for pain.    Dispense:  30 tablet    Refill:  1    Order Specific Question:   Supervising Provider    Answer:   Deborra Medina L [[id number]]   lidocaine (LIDODERM) 5 %    Sig: Place 1 patch onto the skin daily. Remove & Discard patch within 12 hours.    Dispense:  30 patch    Refill:  0    Order Specific Question:  Supervising Provider    Answer:   Crecencio Mc [[id number]]    Return precautions given.   Risks, benefits, and alternatives of the medications and treatment plan prescribed today were discussed, and patient expressed understanding.   Education regarding symptom management and diagnosis given to patient on AVS.  Continue to follow with Burnard Hawthorne, FNP for routine health maintenance.   Deborah Mcconnell and I agreed with plan.   Mable Paris, FNP  I have spent 35 minutes with a  patient including precharting, exam, reviewing medical records, and discussion plan of care.

## 2021-01-09 NOTE — Patient Instructions (Signed)
Start prednisone tomorrow morning A couple of points in regards to meloxicam ( Mobic) -  This medication is not intended for daily , long term use. It is a potent anti inflammatory ( NSAID), and my intention is for you take as needed for moderate to severe pain. If you find yourself using daily, please let me know.   Please takes Mobic ( meloxicam) with FOOD since it is an anti-inflammatory as it can cause a GI bleed or ulcer. If you have a history of GI bleed or ulcer, please do NOT take.  Do no take over the counter aleve, motrin, advil, goody's powder for pain as they are also NSAIDs, and they are  in the same class as Mobic  Lastly, we will need to monitor kidney function while on Mobic, and if we were to see any decline in kidney function in the future, we would have to discontinue this medication.   Referral to orthopedics Let us know if you dont hear back within a week in regards to an appointment being scheduled.

## 2021-01-09 NOTE — Assessment & Plan Note (Addendum)
Reassuring Neurologic and HEENT exam.  She had thorough dental exam yesterday. She is well appearing, non toxic in appearance. Agreed that osteoarthritis may be exacerbating right sided ear, jaw pain. Interestingly pain is absent when supine. We agreed to start with XR, labs, trial of prednisone, mobic prn and orthopedic consult. Consider PT. Close followup.

## 2021-01-11 LAB — ANA: Anti Nuclear Antibody (ANA): NEGATIVE

## 2021-01-11 LAB — CYCLIC CITRUL PEPTIDE ANTIBODY, IGG/IGA: Cyclic Citrullin Peptide Ab: 3 units (ref 0–19)

## 2021-01-11 LAB — RHEUMATOID FACTOR: Rheumatoid fact SerPl-aCnc: <14 IU/mL (ref <14)

## 2021-01-14 ENCOUNTER — Telehealth: Payer: Self-pay

## 2021-01-14 NOTE — Telephone Encounter (Signed)
Lidocaine patches approved through [facility] through 06/29/21. Pt notified.

## 2021-01-18 IMAGING — CR DG THORACIC SPINE 3V
3 series · 3 of 3 positions shown · non-contrast
Comparison: 08/15/2015.

CLINICAL DATA: Back pain.  History of breast cancer.

EXAM:
THORACIC SPINE - 3 VIEWS

[t-spine ap]
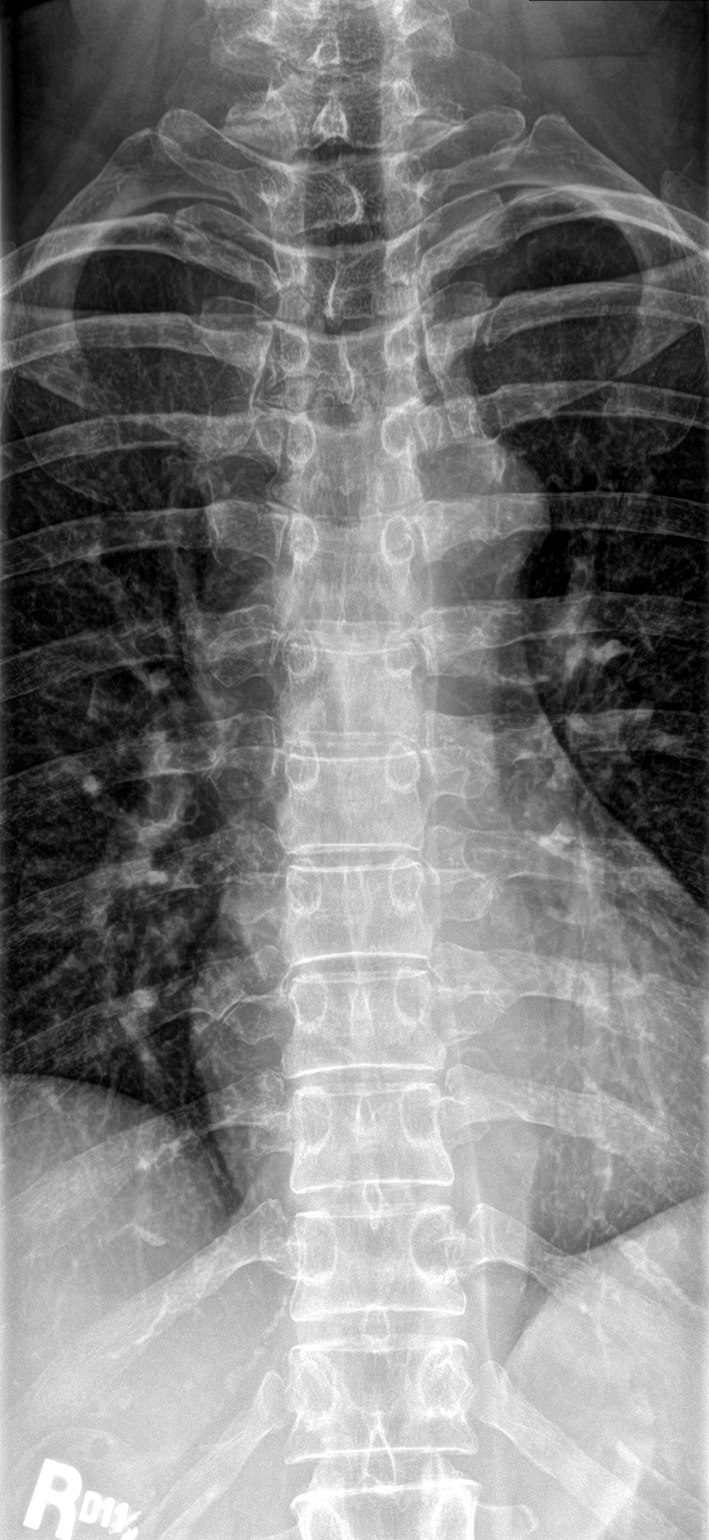

[t-spine lat]
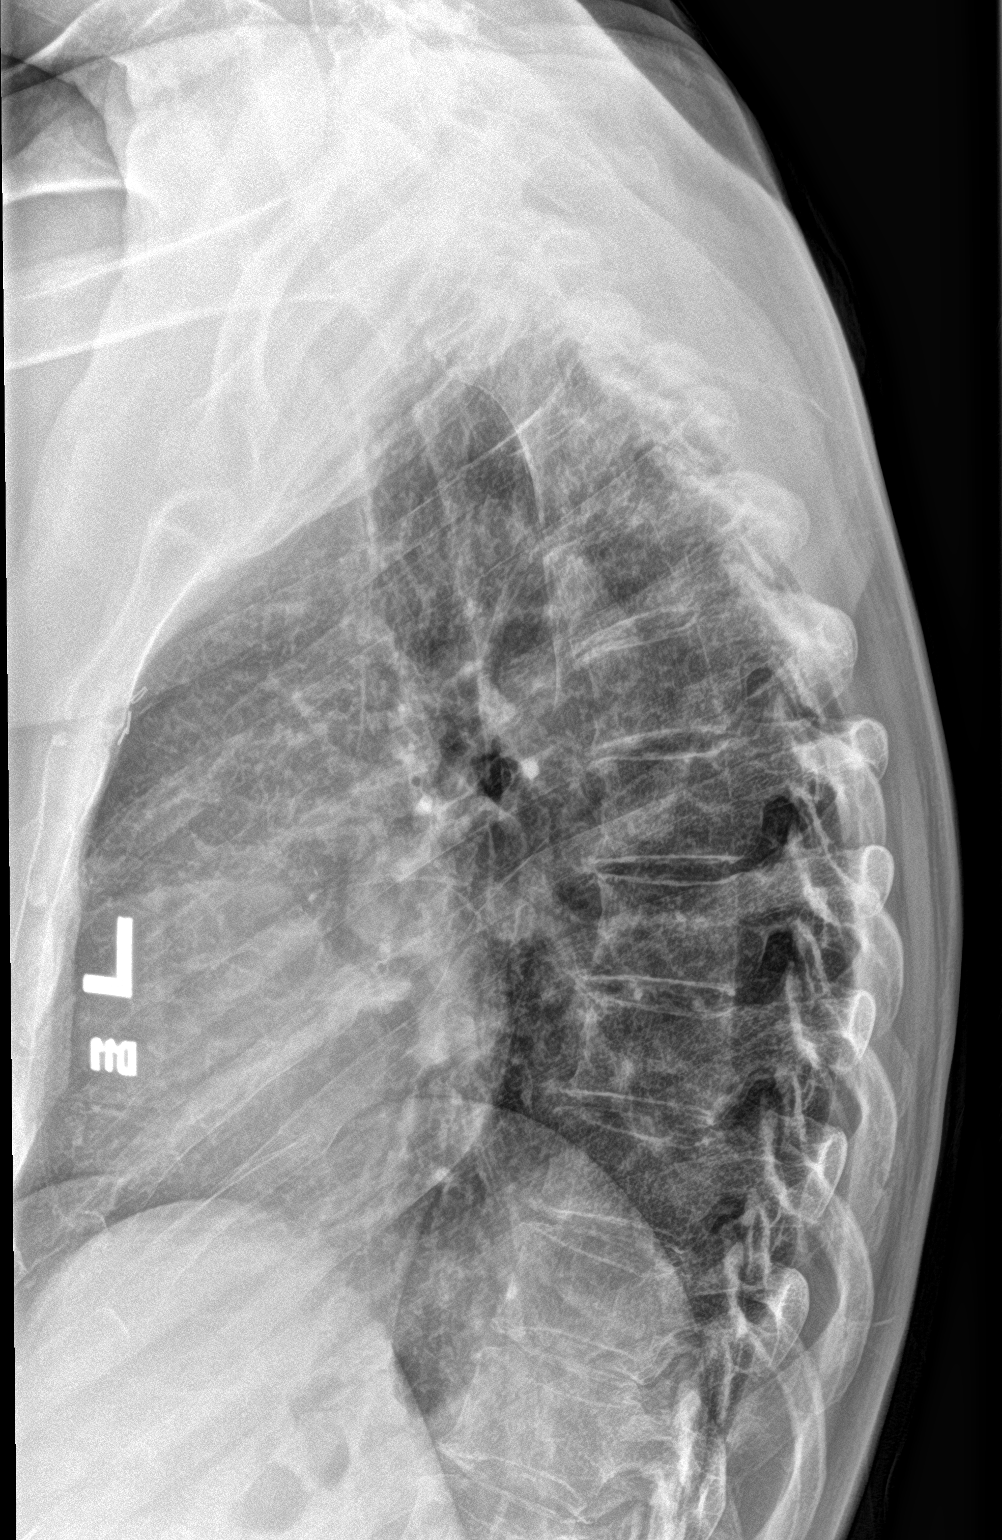

[t-spine swimmers]
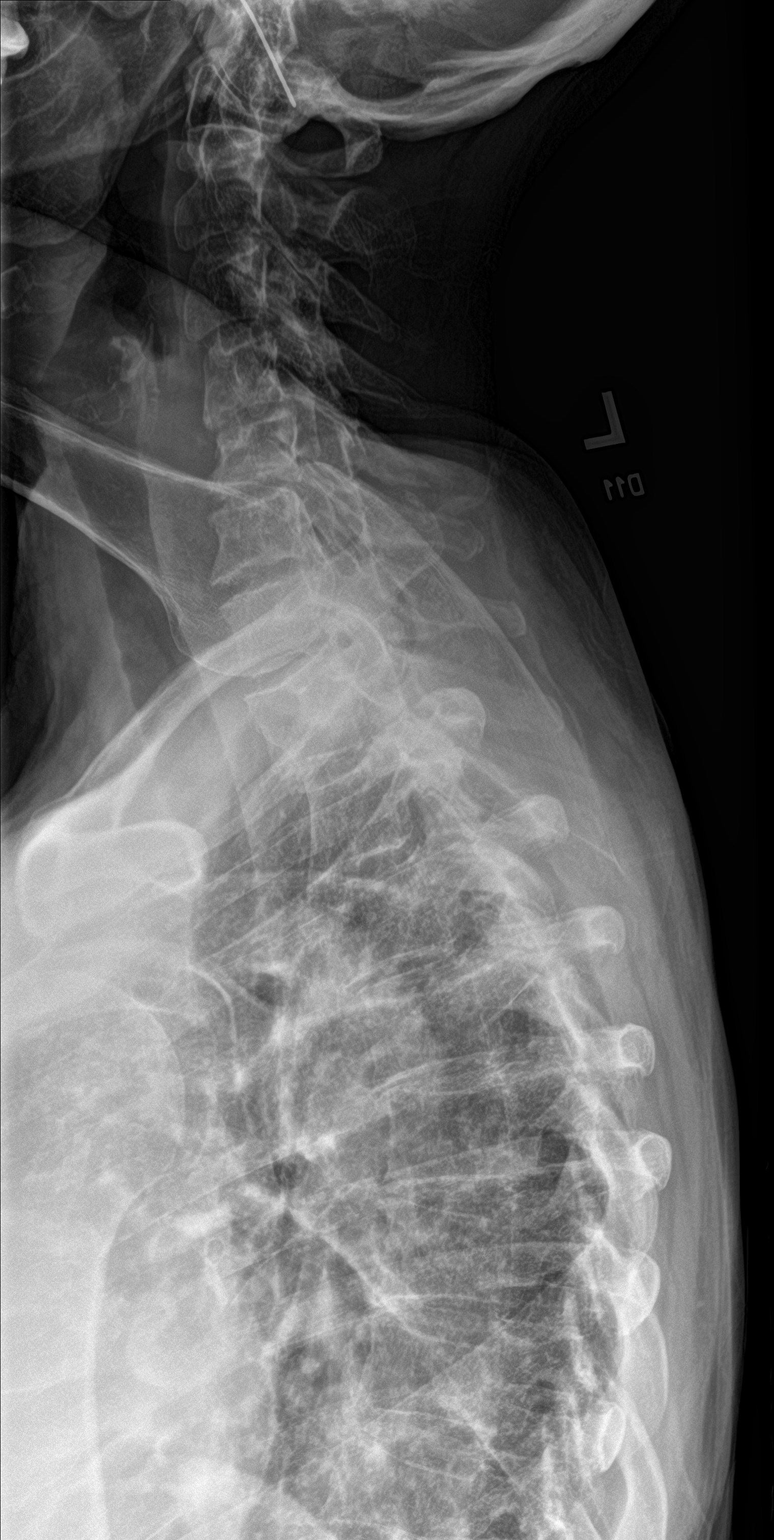

[3 of 3 positions shown; findings below may reference images not displayed]

FINDINGS: Paraspinal soft tissues are normal. Mild thoracic spine scoliosis.
Diffuse degenerative change cervical and thoracic spine. No acute
bony abnormality identified. No evidence of fracture.
IMPRESSION: Mild thoracic spine scoliosis. Diffuse degenerative change cervical
and thoracic spine. No acute or focal bony abnormality.

## 2021-01-21 ENCOUNTER — Encounter: Payer: Self-pay | Admitting: Family Medicine

## 2021-01-21 ENCOUNTER — Other Ambulatory Visit: Payer: Self-pay

## 2021-01-21 ENCOUNTER — Ambulatory Visit (INDEPENDENT_AMBULATORY_CARE_PROVIDER_SITE_OTHER): Payer: Medicare Other | Admitting: Family Medicine

## 2021-01-21 VITALS — BP 130/80 | HR 81 | Temp 98.3°F | Ht 70.0 in | Wt 188.0 lb

## 2021-01-21 DIAGNOSIS — K0889 Other specified disorders of teeth and supporting structures: Secondary | ICD-10-CM | POA: Diagnosis not present

## 2021-01-21 MED ORDER — AMOXICILLIN-POT CLAVULANATE 875-125 MG PO TABS: 1.0000 | ORAL_TABLET | Freq: Two times a day (BID) | ORAL | 0 refills | Status: DC

## 2021-01-21 MED ORDER — TRAMADOL HCL 50 MG PO TABS: 50.0000 mg | ORAL_TABLET | Freq: Three times a day (TID) | ORAL | 0 refills | Status: AC | PRN

## 2021-01-21 NOTE — Patient Instructions (Signed)
Nice to see you. We will try Augmentin for a possible dental infection.  You can take the tramadol for pain.  If this makes you drowsy please let us know. If you develop nausea or vomiting with these medicines please let me know. Please contact your dentist to get their input again.  If they feel it is not related to a tooth please let us know and we can do imaging.

## 2021-01-21 NOTE — Progress Notes (Signed)
Tommi Rumps, MD Phone: [phone]  Deborah Mcconnell is a 70 y.o. female who presents today for follow-up.  Right jaw/tooth/ear pain: This has been going on since 01/03/2021.  Started with a tooth ache in her right upper posterior molar.  It got worse over several days and she saw her dentist.  She had no issues with the cleaning and notes the x-ray looked fine. Her dentist did not see anything wrong with her teeth at that time.  She subsequently saw her PCP who started her on prednisone which was not beneficial.  She was also on meloxicam which was not beneficial.  Aleve was not touching it as well.  She had numerous labs that were unremarkable.  She had an x-ray of her neck that revealed mild to moderate degenerative changes.  She notes the pain is intense.  It radiates into her jaw as well as her ear and sometimes into her neck as well as her soft palate.  Notes it is always there and at times it is an ache though other times it is an intense pain.  She notes it is not a shooting pain or right lightninglike pain.  She notes no tension or soreness in her neck.  Social History   Tobacco Use  Smoking Status Never  Smokeless Tobacco Never    Current Outpatient Medications on File Prior to Visit  Medication Sig Dispense Refill   Ascorbic Acid (VITAMIN C) 1000 MG tablet Take 1,000 mg by mouth daily.     aspirin EC 81 MG tablet Take 81 mg by mouth daily.     B Complex Vitamins (VITAMIN B COMPLEX PO) Take by mouth.     Bacillus Coagulans-Inulin (PROBIOTIC) 1-250 BILLION-MG CAPS Take by mouth.     calcium carbonate (OS-CAL) 600 MG TABS tablet Take by mouth. Take 1,200 mg by mouth.     cephALEXin (KEFLEX) 250 MG capsule Take one after intercourse 90 capsule 1   chlorhexidine (HIBICLENS) 4 % external liquid Apply topically daily as needed. 120 mL 0   CRANBERRY PO Take by mouth.     diclofenac sodium (VOLTAREN) 1 % GEL Apply 4 g topically 4 (four) times daily. 1 Tube 3   fluticasone (FLONASE)  50 MCG/ACT nasal spray Place 1 spray into both nostrils daily.     ibuprofen (ADVIL,MOTRIN) 200 MG tablet Take 600 mg by mouth 2 (two) times daily. prn     lidocaine (LIDODERM) 5 % Place 1 patch onto the skin daily. Remove & Discard patch within 12 hours. 30 patch 0   meloxicam (MOBIC) 7.5 MG tablet Take 1 tablet (7.5 mg total) by mouth daily as needed for pain. 30 tablet 1   predniSONE (DELTASONE) 10 MG tablet Take 4 tablets ( total 40 mg) by mouth for 2 days; take 3 tablets ( total 30 mg) by mouth for 2 days; take 2 tablets ( total 20 mg) by mouth for 1 day; take 1 tablet ( total 10 mg) by mouth for 1 day. 17 tablet 0   simvastatin (ZOCOR) 40 MG tablet Take 1 tablet (40 mg total) by mouth at bedtime. 90 tablet 1   No current facility-administered medications on file prior to visit.     ROS see history of present illness  Objective  Physical Exam Vitals:   01/21/21 1325  BP: 130/80  Pulse: 81  Temp: 98.3 F (36.8 C)  SpO2: 98%    BP Readings from Last 3 Encounters:  01/21/21 130/80  01/09/21 110/62  11/27/20 117/71   Wt Readings from Last 3 Encounters:  01/21/21 188 lb (85.3 kg)  01/09/21 189 lb 3.2 oz (85.8 kg)  11/27/20 185 lb (83.9 kg)    Physical Exam Constitutional:      General: She is not in acute distress.    Appearance: She is not diaphoretic.  HENT:     Head:     Comments: No tenderness of the right or left TMJ, no clunking or clicking on opening and closing her jaw    Mouth/Throat:     Mouth: Mucous membranes are moist.     Pharynx: No oropharyngeal exudate or posterior oropharyngeal erythema.     Comments: Tenderness on running the tongue depressor over the right upper posterior molar, there are no apparent cracks in the molar, no erythema of the gums, no drainage, no tenderness of any other teeth Cardiovascular:     Rate and Rhythm: Normal rate and regular rhythm.     Heart sounds: Normal heart sounds.  Pulmonary:     Effort: Pulmonary effort is  normal.     Breath sounds: Normal breath sounds.  Musculoskeletal:     Cervical back: Neck supple. No tenderness (No tenderness of the anterior right neck).  Lymphadenopathy:     Cervical: No cervical adenopathy.  Skin:    General: Skin is warm and dry.  Neurological:     Mental Status: She is alert.     Assessment/Plan: Please see individual problem list.  Problem List Items Addressed This Visit     Pain, dental - Primary    I suspect this pain is related to a dental issue.  There is certainly the possibility of a dental infection.  She has numerous antibiotic allergies though she only has an intolerance of nausea and vomiting to the Augmentin.  Discussed a trial of Augmentin.  She will not take this on an empty stomach.  If she is unable to keep this down she will let us know.  We will treat her pain with tramadol.  She denies a history of seizures.  I counseled her on the risk of drowsiness with this medicine and she will not drive while taking it.  She will contact her dentist for reevaluation to get their input.  If the dentist advises her that this is not related to her tooth the patient will contact us and we will try to get imaging ordered for her.       Relevant Medications   amoxicillin-clavulanate (AUGMENTIN) 875-125 MG tablet   traMADol (ULTRAM) 50 MG tablet    Return in about 2 weeks (around 02/04/2021) for With PCP.  This visit occurred during the SARS-CoV-2 public health emergency.  Safety protocols were in place, including screening questions prior to the visit, additional usage of staff PPE, and extensive cleaning of exam room while observing appropriate contact time as indicated for disinfecting solutions.   I have spent 32 minutes in the care of this patient regarding taking, documentation, exam, placing orders, review of PCP note from 01/09/2021, review of imaging and lab work from earlier this month.   Tommi Rumps, MD [facility]

## 2021-01-21 NOTE — Assessment & Plan Note (Signed)
I suspect this pain is related to a dental issue.  There is certainly the possibility of a dental infection.  She has numerous antibiotic allergies though she only has an intolerance of nausea and vomiting to the Augmentin.  Discussed a trial of Augmentin.  She will not take this on an empty stomach.  If she is unable to keep this down she will let us know.  We will treat her pain with tramadol.  She denies a history of seizures.  I counseled her on the risk of drowsiness with this medicine and she will not drive while taking it.  She will contact her dentist for reevaluation to get their input.  If the dentist advises her that this is not related to her tooth the patient will contact us and we will try to get imaging ordered for her.

## 2021-01-22 ENCOUNTER — Ambulatory Visit: Payer: Medicare Other | Admitting: Family

## 2021-02-11 ENCOUNTER — Telehealth: Payer: Self-pay | Admitting: Family

## 2021-02-11 NOTE — Telephone Encounter (Signed)
Rejection Reason - Patient Declined - Pt cancelled" Grayland Jack said on Feb 11, 2021 12:53 PM  Msg sent from [facility]

## 2021-03-01 ENCOUNTER — Ambulatory Visit: Payer: Medicare Other | Admitting: Family

## 2021-04-04 DIAGNOSIS — Z923 Personal history of irradiation: Secondary | ICD-10-CM | POA: Diagnosis not present

## 2021-04-04 DIAGNOSIS — C50312 Malignant neoplasm of lower-inner quadrant of left female breast: Secondary | ICD-10-CM | POA: Diagnosis not present

## 2021-04-04 DIAGNOSIS — Z170 Estrogen receptor positive status [ER+]: Secondary | ICD-10-CM | POA: Diagnosis not present

## 2021-04-04 DIAGNOSIS — Z1231 Encounter for screening mammogram for malignant neoplasm of breast: Secondary | ICD-10-CM | POA: Diagnosis not present

## 2021-04-04 DIAGNOSIS — Z9221 Personal history of antineoplastic chemotherapy: Secondary | ICD-10-CM | POA: Diagnosis not present

## 2021-04-10 ENCOUNTER — Other Ambulatory Visit: Payer: Self-pay | Admitting: Family

## 2021-04-17 ENCOUNTER — Other Ambulatory Visit: Payer: Self-pay

## 2021-04-17 ENCOUNTER — Encounter: Payer: Self-pay | Admitting: Family

## 2021-04-17 ENCOUNTER — Ambulatory Visit (INDEPENDENT_AMBULATORY_CARE_PROVIDER_SITE_OTHER): Payer: Medicare Other | Admitting: Family

## 2021-04-17 VITALS — BP 126/78 | HR 76 | Temp 96.4°F | Ht 70.0 in | Wt 191.6 lb

## 2021-04-17 DIAGNOSIS — Z7982 Long term (current) use of aspirin: Secondary | ICD-10-CM | POA: Diagnosis not present

## 2021-04-17 DIAGNOSIS — Z23 Encounter for immunization: Secondary | ICD-10-CM

## 2021-04-17 DIAGNOSIS — E782 Mixed hyperlipidemia: Secondary | ICD-10-CM | POA: Diagnosis not present

## 2021-04-17 DIAGNOSIS — M8580 Other specified disorders of bone density and structure, unspecified site: Secondary | ICD-10-CM

## 2021-04-17 NOTE — Assessment & Plan Note (Signed)
Counseled on risk of fatal GI bleeding, hemorrhagic stroke with 81 mg of aspirin over the age of 38.  Patient very aware.  She will continue aspirin for the time being and we will continue to discuss

## 2021-04-17 NOTE — Assessment & Plan Note (Signed)
Chronic, stable.  Continue zocor 40mg .

## 2021-04-17 NOTE — Assessment & Plan Note (Signed)
Counseled on appropriate doses of calcium to take.  Advised to take 600 mg daily supplement versus 1200 mg due to concern for coronary calcification.  Provided further information on her AVS

## 2021-04-17 NOTE — Patient Instructions (Signed)
As discussed, for now we will continue aspirin 81mg  with close attention to benefit versus risk.  Benefit being reduced risk of ischemic stroke, non fatal heart attack and colorectal cancer ( if used for 10 years). However these benefits are most apparent in adults aged 70 to 88 years with a ?10% 10-year cardiovasular risk . Persons who are not at increased risk for bleeding, have a life expectancy of at least 10 years, and are willing to take low-dose aspirin daily for at least 10 years are more likely to benefit. Persons who place a higher value on the potential benefits than the potential harms may choose to initiate low-dose aspirin.  At 70 years and above the risk of gastrointestinal bleeding, falls,  hemorrhagic stroke increases therefore becoming a very personal discussion in regards to whether you continue aspirin 81 mg.   For now , we have opted to continue however please bring to my attention at follow so we can continue to have this discussion.    For post menopausal women, guidelines recommend a diet with 1200 mg of Calcium per day. If you are eating calcium rich foods, you do not need a calcium supplement. The body better absorbs the calcium that you eat over supplementation. If you do supplement, I recommend not supplementing the full 1200 mg/ day as this can lead to increased risk of cardiovascular disease. I recommend Calcium Citrate over the counter, and you may take a total of 600 to 800 mg per day in divided doses with meals for best absorption.   For bone health, you need adequate vitamin D, and I recommend you supplement as it is harder to do so with diet alone. I recommend cholecalciferol 800 units daily.  Also, please ensure you are following a diet high in calcium -- research shows better outcomes with dietary sources including kale, yogurt, broccolii, cheese, okra, almonds- to name a few.     Also remember that exercise is a great medicine for maintain and preserve bone health.  Advise moderate exercise for 30 minutes , 3 times per week.

## 2021-04-17 NOTE — Progress Notes (Signed)
Subjective:    Patient ID: Deborah Mcconnell, female    DOB: 1951/01/08, 70 y.o.   MRN: [id number]  CC: Deborah Mcconnell is a 70 y.o. female who presents today for follow up.   HPI: Feels well today.  No new complaints  Neck pain resolved with tooth extraction 3 months ago.  Hyperlipidemia-compliant with Zocor 40 mg  Compliant with aspirin 81 mg.  No bleeding  She is taking calcium carbonate 200 mg daily. Walks daily  Pap smear due HISTORY:  Past Medical History:  Diagnosis Date   Arthritis    osteoarthritis   Breast cancer, left ([place]) 2014   Hx Lumpectomy and Rad tx's.   Hyperlipidemia    Osteopenia    previously taking Evista and Actonel   Osteoporosis    Vertigo    Past Surgical History:  Procedure Laterality Date   CHOLECYSTECTOMY     lt breast lumpectomy  2014   Family History  Problem Relation Age of Onset   Osteoporosis Mother    Hypertension Mother    Osteoarthritis Mother    Osteoporosis Sister    Osteoarthritis Sister    Osteoarthritis Maternal Grandmother    Diabetes Maternal Grandfather    Cancer Paternal Grandfather        unknown   Prostate cancer Neg Hx    Bladder Cancer Neg Hx    Kidney cancer Neg Hx     Allergies: Ciprofloxacin, Ofloxacin, Quinolones, Actonel [risedronate], Augmentin [amoxicillin-pot clavulanate], and Bactrim [sulfamethoxazole-trimethoprim] Current Outpatient Medications on File Prior to Visit  Medication Sig Dispense Refill   Ascorbic Acid (VITAMIN C) 1000 MG tablet Take 1,000 mg by mouth daily.     aspirin EC 81 MG tablet Take 81 mg by mouth daily.     B Complex Vitamins (VITAMIN B COMPLEX PO) Take by mouth.     Bacillus Coagulans-Inulin (PROBIOTIC) 1-250 BILLION-MG CAPS Take by mouth.     calcium carbonate (OS-CAL) 600 MG TABS tablet Take by mouth. Take 1,200 mg by mouth.     cephALEXin (KEFLEX) 250 MG capsule Take one after intercourse 90 capsule 1   chlorhexidine (HIBICLENS) 4 % external liquid Apply topically  daily as needed. 120 mL 0   CRANBERRY PO Take by mouth.     diclofenac sodium (VOLTAREN) 1 % GEL Apply 4 g topically 4 (four) times daily. 1 Tube 3   fluticasone (FLONASE) 50 MCG/ACT nasal spray Place 1 spray into both nostrils daily.     ibuprofen (ADVIL,MOTRIN) 200 MG tablet Take 600 mg by mouth 2 (two) times daily. prn     lidocaine (LIDODERM) 5 % Place 1 patch onto the skin daily. Remove & Discard patch within 12 hours. 30 patch 0   meloxicam (MOBIC) 7.5 MG tablet Take 1 tablet (7.5 mg total) by mouth daily as needed for pain. 30 tablet 1   simvastatin (ZOCOR) 40 MG tablet TAKE 1 TABLET BY MOUTH AT BEDTIME 90 tablet 0   No current facility-administered medications on file prior to visit.    Social History   Tobacco Use   Smoking status: Never   Smokeless tobacco: Never  Vaping Use   Vaping Use: Never used  Substance Use Topics   Alcohol use: Yes    Alcohol/week: 0.0 standard drinks    Comment: rare   Drug use: No    Review of Systems  Constitutional:  Negative for chills and fever.  Respiratory:  Negative for cough.   Cardiovascular:  Negative for chest pain and  palpitations.  Gastrointestinal:  Negative for nausea and vomiting.  Musculoskeletal:  Negative for neck pain (resolved).     Objective:    BP 126/78   Pulse 76   Temp (!) 96.4 F (35.8 C)   Ht 5\' 10"  (1.778 m)   Wt 191 lb 9.6 oz (86.9 kg)   SpO2 97%   BMI 27.49 kg/m  BP Readings from Last 3 Encounters:  04/17/21 126/78  01/21/21 130/80  01/09/21 110/62   Wt Readings from Last 3 Encounters:  04/17/21 191 lb 9.6 oz (86.9 kg)  01/21/21 188 lb (85.3 kg)  01/09/21 189 lb 3.2 oz (85.8 kg)    Physical Exam Vitals reviewed.  Constitutional:      Appearance: She is well-developed.  Eyes:     Conjunctiva/sclera: Conjunctivae normal.  Cardiovascular:     Rate and Rhythm: Normal rate and regular rhythm.     Pulses: Normal pulses.     Heart sounds: Normal heart sounds.  Pulmonary:     Effort:  Pulmonary effort is normal.     Breath sounds: Normal breath sounds. No wheezing, rhonchi or rales.  Skin:    General: Skin is warm and dry.  Neurological:     Mental Status: She is alert.  Psychiatric:        Speech: Speech normal.        Behavior: Behavior normal.        Thought Content: Thought content normal.       Assessment & Plan:   Problem List Items Addressed This Visit       Musculoskeletal and Integument   Osteopenia    Counseled on appropriate doses of calcium to take.  Advised to take 600 mg daily supplement versus 1200 mg due to concern for coronary calcification.  Provided further information on her AVS        Other   Encounter for long-term (current) aspirin use    Counseled on risk of fatal GI bleeding, hemorrhagic stroke with 81 mg of aspirin over the age of 16.  Patient very aware.  She will continue aspirin for the time being and we will continue to discuss      Hyperlipidemia    Chronic, stable.  Continue zocor 40mg .         I have discontinued Deborah Curet "[name]"'s predniSONE and amoxicillin-clavulanate. I am also having her maintain her ibuprofen, calcium carbonate, diclofenac sodium, vitamin C, CRANBERRY PO, B Complex Vitamins (VITAMIN B COMPLEX PO), aspirin EC, Probiotic, fluticasone, cephALEXin, Hibiclens, meloxicam, lidocaine, and simvastatin.   No orders of the defined types were placed in this encounter.   Return precautions given.   Risks, benefits, and alternatives of the medications and treatment plan prescribed today were discussed, and patient expressed understanding.   Education regarding symptom management and diagnosis given to patient on AVS.  Continue to follow with Burnard Hawthorne, FNP for routine health maintenance.   Deborah Mcconnell and I agreed with plan.   Mable Paris, FNP

## 2021-06-05 ENCOUNTER — Telehealth: Payer: Self-pay | Admitting: Family

## 2021-06-05 NOTE — Telephone Encounter (Signed)
Patient's husband called stating that medicare would not pay for a office bill due to it stating it was a dental visit. I sent the information to the coders for review. The below statement was sent back.  There is no other diagnosis documented in the Assessment and Plan other than the dental pain. Nothing on the exam either to support changing the diagnosis. Looks like per his dx the dental issue is the cause of her pain.  I called the patient and informed her of this information . She understood .

## 2021-08-23 ENCOUNTER — Ambulatory Visit: Payer: Medicare Other

## 2021-08-27 ENCOUNTER — Ambulatory Visit (INDEPENDENT_AMBULATORY_CARE_PROVIDER_SITE_OTHER): Payer: Medicare Other

## 2021-08-27 VITALS — Ht 70.0 in | Wt 191.0 lb

## 2021-08-27 DIAGNOSIS — Z0000 Encounter for general adult medical examination without abnormal findings: Secondary | ICD-10-CM | POA: Diagnosis not present

## 2021-08-27 NOTE — Progress Notes (Signed)
Subjective:   Deborah Mcconnell is a 71 y.o. female who presents for Medicare Annual (Subsequent) preventive examination.  Review of Systems    No ROS.  Medicare Wellness Virtual Visit.  Visual/audio telehealth visit, UTA vital signs.   See social history for additional risk factors.   Cardiac Risk Factors include: advanced age (>76men, >20 women)     Objective:    Today's Vitals   08/27/21 0911  Weight: 191 lb (86.6 kg)  Height: 5\' 10"  (1.778 m)   Body mass index is 27.41 kg/m.  Advanced Directives 08/27/2021 08/22/2020 08/22/2019 08/19/2018 07/25/2018 08/18/2017 08/15/2016  Does Patient Have a Medical Advance Directive? Yes Yes Yes Yes No Yes Yes  Type of Paramedic of [place];Living will [place];Living will Living will;Healthcare Power of [place];Living will - [place];Living will [place];Living will  Does patient want to make changes to medical advance directive? No - Patient declined No - Patient declined No - Patient declined No - Patient declined - No - Patient declined No - Patient declined  Copy of [place] in Chart? No - copy requested No - copy requested No - copy requested No - copy requested - No - copy requested No - copy requested  Would patient like information on creating a medical advance directive? - - - - No - Patient declined - -    Current Medications (verified) Outpatient Encounter Medications as of 08/27/2021  Medication Sig   Ascorbic Acid (VITAMIN C) 1000 MG tablet Take 1,000 mg by mouth daily.   aspirin EC 81 MG tablet Take 81 mg by mouth daily.   B Complex Vitamins (VITAMIN B COMPLEX PO) Take by mouth.   Bacillus Coagulans-Inulin (PROBIOTIC) 1-250 BILLION-MG CAPS Take by mouth.   calcium carbonate (OS-CAL) 600 MG TABS tablet Take by mouth. Take 1,200 mg by mouth.   cephALEXin (KEFLEX) 250 MG capsule Take one after  intercourse   chlorhexidine (HIBICLENS) 4 % external liquid Apply topically daily as needed.   CRANBERRY PO Take by mouth.   diclofenac sodium (VOLTAREN) 1 % GEL Apply 4 g topically 4 (four) times daily.   ibuprofen (ADVIL,MOTRIN) 200 MG tablet Take 600 mg by mouth 2 (two) times daily. prn   meloxicam (MOBIC) 7.5 MG tablet Take 1 tablet (7.5 mg total) by mouth daily as needed for pain.   simvastatin (ZOCOR) 40 MG tablet TAKE 1 TABLET BY MOUTH AT BEDTIME   [DISCONTINUED] fluticasone (FLONASE) 50 MCG/ACT nasal spray Place 1 spray into both nostrils daily.   [DISCONTINUED] lidocaine (LIDODERM) 5 % Place 1 patch onto the skin daily. Remove & Discard patch within 12 hours.   No facility-administered encounter medications on file as of 08/27/2021.    Allergies (verified) Ciprofloxacin, Ofloxacin, Quinolones, Actonel [risedronate], Augmentin [amoxicillin-pot clavulanate], and Bactrim [sulfamethoxazole-trimethoprim]   History: Past Medical History:  Diagnosis Date   Arthritis    osteoarthritis   Breast cancer, left ([place]) 2014   Hx Lumpectomy and Rad tx's.   Hyperlipidemia    Osteopenia    previously taking Evista and Actonel   Osteoporosis    Vertigo    Past Surgical History:  Procedure Laterality Date   CHOLECYSTECTOMY     lt breast lumpectomy  2014   Family History  Problem Relation Age of Onset   Osteoporosis Mother    Hypertension Mother    Osteoarthritis Mother    Osteoporosis Sister    Osteoarthritis  Sister    Osteoarthritis Maternal Grandmother    Diabetes Maternal Grandfather    Cancer Paternal Grandfather        unknown   Prostate cancer Neg Hx    Bladder Cancer Neg Hx    Kidney cancer Neg Hx    Social History   Socioeconomic History   Marital status: Married    Spouse name: Not on file   Number of children: Not on file   Years of education: Not on file   Highest education level: Not on file  Occupational History   Not on file  Tobacco Use   Smoking status:  Never   Smokeless tobacco: Never  Vaping Use   Vaping Use: Never used  Substance and Sexual Activity   Alcohol use: Yes    Alcohol/week: 0.0 standard drinks    Comment: rare   Drug use: No   Sexual activity: Yes  Other Topics Concern   Not on file  Social History Narrative   'lindy'      Works at [employer] in [place].       Married.       Exercise- not excercising.          Social Determinants of Health   Financial Resource Strain: Low Risk    Difficulty of Paying Living Expenses: Not hard at all  Food Insecurity: No Food Insecurity   Worried About Charity fundraiser in the Last Year: Never true   [place] in the Last Year: Never true  Transportation Needs: No Transportation Needs   Lack of Transportation (Medical): No   Lack of Transportation (Non-Medical): No  Physical Activity: Sufficiently Active   Days of Exercise per Week: 5 days   Minutes of Exercise per Session: 30 min  Stress: No Stress Concern Present   Feeling of Stress : Not at all  Social Connections: Socially Integrated   Frequency of Communication with Friends and Family: More than three times a week   Frequency of Social Gatherings with Friends and Family: More than three times a week   Attends Religious Services: 1 to 4 times per year   Active Member of [employer] or Organizations: Yes   Attends Archivist Meetings: Not on file   Marital Status: Married    Tobacco Counseling Counseling given: Not Answered   Clinical Intake:                         Activities of Daily Living In your present state of health, do you have any difficulty performing the following activities: 08/27/2021  Hearing? N  Vision? N  Difficulty concentrating or making decisions? N  Walking or climbing stairs? N  Dressing or bathing? N  Doing errands, shopping? N  Preparing Food and eating ? N  Using the Toilet? N  In the past six months, have you accidently leaked urine? N   Do you have problems with loss of bowel control? N  Managing your Medications? N  Managing your Finances? N  Housekeeping or managing your Housekeeping? N  Some recent data might be hidden    Patient Care Team: Burnard Hawthorne, FNP as PCP - General (Family Medicine)  Indicate any recent Medical Services you may have received from other than [facility] providers in the past year (date may be approximate).     Assessment:   This is a routine wellness examination for [place].  Virtual Visit via Telephone Note  I  connected with  Wonda Amis on 08/27/21 at  9:00 AM EST by telephone and verified that I am speaking with the correct person using two identifiers.  Persons participating in the virtual visit: patient/Nurse Health Advisor   I discussed the limitations, risks, security and privacy concerns of performing an evaluation and management service by telephone and the availability of in person appointments. The patient expressed understanding and agreed to proceed.  Interactive audio and video telecommunications were attempted between this nurse and patient, however failed, due to patient having technical difficulties OR patient did not have access to video capability.  We continued and completed visit with audio only.  Some vital signs may be absent or patient reported.   Hearing/Vision screen Hearing Screening - Comments:: Patient is able to hear conversational tones without difficulty. No issues reported. Vision Screening - Comments:: Followed by [facility]  Cataracts extracted, bilateral   Dietary issues and exercise activities discussed: Current Exercise Habits: Home exercise routine, Type of exercise: walking, Time (Minutes): 30, Frequency (Times/Week): 5, Weekly Exercise (Minutes/Week): 150, Intensity: Mild  Regular diet Good water intake    Goals Addressed             This Visit's Progress    Increase physical activity       Add weight bearing exercises        Depression Screen PHQ 2/9 Scores 08/27/2021 04/17/2021 01/21/2021 11/05/2020 08/22/2020 08/22/2019 04/18/2019  PHQ - 2 Score 0 0 0 0 0 0 0  PHQ- 9 Score - - - - - - 0    Fall Risk Fall Risk  08/27/2021 04/17/2021 01/21/2021 11/05/2020 08/22/2020  Falls in the past year? 0 0 0 0 0  Number falls in past yr: 0 0 0 0 0  Injury with Fall? - 0 - 0 0  Comment - - - - -  Follow up Falls evaluation completed Falls evaluation completed Falls evaluation completed Falls evaluation completed Falls evaluation completed   [place]:  Home free of loose throw rugs in walkways, pet beds, electrical cords, etc? Yes  Adequate lighting in your home to reduce risk of falls? Yes   ASSISTIVE DEVICES UTILIZED TO PREVENT FALLS:  Life alert? No  Use of a cane, walker or w/c? No   TIMED UP AND GO: Was the test performed? No .   Cognitive Function: Patient is alert and oriented x3.  MMSE - Mini Mental State Exam 08/19/2018 08/18/2017 08/15/2016  Orientation to time 5 5 5   Orientation to Place 5 5 5   Registration 3 3 3   Attention/ Calculation 5 5 5   Recall 3 3 3   Language- name 2 objects 2 2 2   Language- repeat 1 1 1   Language- follow 3 step command 3 3 3   Language- read & follow direction 1 1 1   Write a sentence 1 1 1   Copy design 1 1 1   Total score 30 30 30      6CIT Screen 08/27/2021 08/22/2019 08/15/2016  What Year? 0 points 0 points 0 points  What month? 0 points 0 points 0 points  What time? 0 points 0 points 0 points  Count back from 20 0 points 0 points 0 points  Months in reverse 0 points 0 points 0 points  Repeat phrase 0 points 0 points 0 points  Total Score 0 0 0    Immunizations Immunization History  Administered Date(s) Administered   Fluad Quad(high Dose 65+) 04/26/2019, 03/12/2020, 04/17/2021  Influenza Whole 04/13/2013   Influenza, High Dose Seasonal PF 02/27/2016, 04/17/2017, 03/31/2018   Influenza,inj,Quad PF,6+ Mos 06/13/2015    Influenza-Unspecified 04/03/2014, 06/13/2015   Moderna Sars-Covid-2 Vaccination 08/07/2019, 09/07/2019, 09/03/2020   PPD Test 03/14/2020   Pneumococcal Conjugate-13 09/10/2015   Pneumococcal Polysaccharide-23 04/28/2012, 08/18/2017   Tdap 10/05/2014   Zoster, Live 05/08/2014   Shingrix Completed?: No.    Education has been provided regarding the importance of this vaccine. Patient has been advised to call insurance company to determine out of pocket expense if they have not yet received this vaccine. Advised may also receive vaccine at local pharmacy or Health Dept. Verbalized acceptance and understanding.  Screening Tests Health Maintenance  Topic Date Due   COVID-19 Vaccine (4 - Booster for Moderna series) 09/12/2021 (Originally 10/29/2020)   PAP SMEAR-Modifier  10/15/2021 (Originally 04/17/2021)   Zoster Vaccines- Shingrix (1 of 2) 11/24/2021 (Originally 08/15/1969)   MAMMOGRAM  04/05/2023   COLONOSCOPY (Pts 45-16yrs Insurance coverage will need to be confirmed)  07/24/2024   TETANUS/TDAP  10/04/2024   Pneumonia Vaccine 61+ Years old  Completed   INFLUENZA VACCINE  Completed   DEXA SCAN  Completed   Hepatitis C Screening  Completed   HPV VACCINES  Aged Out   Health Maintenance There are no preventive care reminders to display for this patient.  Lung Cancer Screening: (Low Dose CT Chest recommended if Age 27-80 years, 30 pack-year currently smoking OR have quit w/in 15years.) does not qualify.   Vision Screening: Recommended annual ophthalmology exams for early detection of glaucoma and other disorders of the eye.  Dental Screening: Recommended annual dental exams for proper oral hygiene  Community Resource Referral / Chronic Care Management: CRR required this visit?  No   CCM required this visit?  No      Plan:   Keep all routine maintenance appointments.   I have personally reviewed and noted the following in the patients chart:   Medical and social history Use of  alcohol, tobacco or illicit drugs  Current medications and supplements including opioid prescriptions.  Functional ability and status Nutritional status Physical activity Advanced directives List of other physicians Hospitalizations, surgeries, and ER visits in previous 12 months Vitals Screenings to include cognitive, depression, and falls Referrals and appointments  In addition, I have reviewed and discussed with patient certain preventive protocols, quality metrics, and best practice recommendations. A written personalized care plan for preventive services as well as general preventive health recommendations were provided to patient.     Varney Biles, LPN   [id number]

## 2021-08-27 NOTE — Patient Instructions (Addendum)
[name] , Thank you for taking time to come for your Medicare Wellness Visit. I appreciate your ongoing commitment to your health goals. Please review the following plan we discussed and let me know if I can assist you in the future.   These are the goals we discussed:  Goals      Follow up with Primary Care Provider     As needed     Increase physical activity     Add weight bearing exercises        This is a list of the screening recommended for you and due dates:  Health Maintenance  Topic Date Due   COVID-19 Vaccine (4 - Booster for Moderna series) 09/12/2021*   Pap Smear  10/15/2021*   Zoster (Shingles) Vaccine (1 of 2) 11/24/2021*   Mammogram  04/05/2023   Colon Cancer Screening  07/24/2024   Tetanus Vaccine  10/04/2024   Pneumonia Vaccine  Completed   Flu Shot  Completed   DEXA scan (bone density measurement)  Completed   Hepatitis C Screening: USPSTF Recommendation to screen - Ages 22-79 yo.  Completed   HPV Vaccine  Aged Out  *Topic was postponed. The date shown is not the original due date.    Advanced directives: End of life planning; Advance aging; Advanced directives discussed.  Copy of current HCPOA/Living Will requested.    Conditions/risks identified: none new.  `Follow up in one year for your annual wellness visit    Preventive Care 65 Years and Older, Female Preventive care refers to lifestyle choices and visits with your health care provider that can promote health and wellness. What does preventive care include? A yearly physical exam. This is also called an annual well check. Dental exams once or twice a year. Routine eye exams. Ask your health care provider how often you should have your eyes checked. Personal lifestyle choices, including: Daily care of your teeth and gums. Regular physical activity. Eating a healthy diet. Avoiding tobacco and drug use. Limiting alcohol use. Practicing safe sex. Taking low-dose aspirin every  day. Taking vitamin and mineral supplements as recommended by your health care provider. What happens during an annual well check? The services and screenings done by your health care provider during your annual well check will depend on your age, overall health, lifestyle risk factors, and family history of disease. Counseling  Your health care provider may ask you questions about your: Alcohol use. Tobacco use. Drug use. Emotional well-being. Home and relationship well-being. Sexual activity. Eating habits. History of falls. Memory and ability to understand (cognition). Work and work Statistician. Reproductive health. Screening  You may have the following tests or measurements: Height, weight, and BMI. Blood pressure. Lipid and cholesterol levels. These may be checked every 5 years, or more frequently if you are over 75 years old. Skin check. Lung cancer screening. You may have this screening every year starting at age 80 if you have a 30-pack-year history of smoking and currently smoke or have quit within the past 15 years. Fecal occult blood test (FOBT) of the stool. You may have this test every year starting at age 34. Flexible sigmoidoscopy or colonoscopy. You may have a sigmoidoscopy every 5 years or a colonoscopy every 10 years starting at age 92. Hepatitis C blood test. Hepatitis B blood test. Sexually transmitted disease (STD) testing. Diabetes screening. This is done by checking your blood sugar (glucose) after you have not eaten for a while (fasting). You may have this done every 1-3  years. Bone density scan. This is done to screen for osteoporosis. You may have this done starting at age 79. Mammogram. This may be done every 1-2 years. Talk to your health care provider about how often you should have regular mammograms. Talk with your health care provider about your test results, treatment options, and if necessary, the need for more tests. Vaccines  Your health care  provider may recommend certain vaccines, such as: Influenza vaccine. This is recommended every year. Tetanus, diphtheria, and acellular pertussis (Tdap, Td) vaccine. You may need a Td booster every 10 years. Zoster vaccine. You may need this after age 14. Pneumococcal 13-valent conjugate (PCV13) vaccine. One dose is recommended after age 49. Pneumococcal polysaccharide (PPSV23) vaccine. One dose is recommended after age 58. Talk to your health care provider about which screenings and vaccines you need and how often you need them. This information is not intended to replace advice given to you by your health care provider. Make sure you discuss any questions you have with your health care provider. Document Released: 07/13/2015 Document Revised: 03/05/2016 Document Reviewed: 04/17/2015 Elsevier Interactive Patient Education  2017 [place] Prevention in the Home Falls can cause injuries. They can happen to people of all ages. There are many things you can do to make your home safe and to help prevent falls. What can I do on the outside of my home? Regularly fix the edges of walkways and driveways and fix any cracks. Remove anything that might make you trip as you walk through a door, such as a raised step or threshold. Trim any bushes or trees on the path to your home. Use bright outdoor lighting. Clear any walking paths of anything that might make someone trip, such as rocks or tools. Regularly check to see if handrails are loose or broken. Make sure that both sides of any steps have handrails. Any raised decks and porches should have guardrails on the edges. Have any leaves, snow, or ice cleared regularly. Use sand or salt on walking paths during winter. Clean up any spills in your garage right away. This includes oil or grease spills. What can I do in the bathroom? Use night lights. Install grab bars by the toilet and in the tub and shower. Do not use towel bars as grab  bars. Use non-skid mats or decals in the tub or shower. If you need to sit down in the shower, use a plastic, non-slip stool. Keep the floor dry. Clean up any water that spills on the floor as soon as it happens. Remove soap buildup in the tub or shower regularly. Attach bath mats securely with double-sided non-slip rug tape. Do not have throw rugs and other things on the floor that can make you trip. What can I do in the bedroom? Use night lights. Make sure that you have a light by your bed that is easy to reach. Do not use any sheets or blankets that are too big for your bed. They should not hang down onto the floor. Have a firm chair that has side arms. You can use this for support while you get dressed. Do not have throw rugs and other things on the floor that can make you trip. What can I do in the kitchen? Clean up any spills right away. Avoid walking on wet floors. Keep items that you use a lot in easy-to-reach places. If you need to reach something above you, use a strong step stool that has a grab  bar. Keep electrical cords out of the way. Do not use floor polish or wax that makes floors slippery. If you must use wax, use non-skid floor wax. Do not have throw rugs and other things on the floor that can make you trip. What can I do with my stairs? Do not leave any items on the stairs. Make sure that there are handrails on both sides of the stairs and use them. Fix handrails that are broken or loose. Make sure that handrails are as long as the stairways. Check any carpeting to make sure that it is firmly attached to the stairs. Fix any carpet that is loose or worn. Avoid having throw rugs at the top or bottom of the stairs. If you do have throw rugs, attach them to the floor with carpet tape. Make sure that you have a light switch at the top of the stairs and the bottom of the stairs. If you do not have them, ask someone to add them for you. What else can I do to help prevent  falls? Wear shoes that: Do not have high heels. Have rubber bottoms. Are comfortable and fit you well. Are closed at the toe. Do not wear sandals. If you use a stepladder: Make sure that it is fully opened. Do not climb a closed stepladder. Make sure that both sides of the stepladder are locked into place. Ask someone to hold it for you, if possible. Clearly mark and make sure that you can see: Any grab bars or handrails. First and last steps. Where the edge of each step is. Use tools that help you move around (mobility aids) if they are needed. These include: Canes. Walkers. Scooters. Crutches. Turn on the lights when you go into a dark area. Replace any light bulbs as soon as they burn out. Set up your furniture so you have a clear path. Avoid moving your furniture around. If any of your floors are uneven, fix them. If there are any pets around you, be aware of where they are. Review your medicines with your doctor. Some medicines can make you feel dizzy. This can increase your chance of falling. Ask your doctor what other things that you can do to help prevent falls. This information is not intended to replace advice given to you by your health care provider. Make sure you discuss any questions you have with your health care provider. Document Released: 04/12/2009 Document Revised: 11/22/2015 Document Reviewed: 07/21/2014 Elsevier Interactive Patient Education  2017 [employer].

## 2021-10-16 ENCOUNTER — Encounter: Payer: Self-pay | Admitting: Family

## 2021-10-16 ENCOUNTER — Other Ambulatory Visit (HOSPITAL_COMMUNITY)
Admission: RE | Admit: 2021-10-16 | Discharge: 2021-10-16 | Disposition: A | Discharge status: Home / Self Care | Payer: Medicare Other | Source: Ambulatory Visit | Attending: Family | Admitting: Family

## 2021-10-16 ENCOUNTER — Ambulatory Visit (INDEPENDENT_AMBULATORY_CARE_PROVIDER_SITE_OTHER): Payer: Medicare Other

## 2021-10-16 ENCOUNTER — Ambulatory Visit (INDEPENDENT_AMBULATORY_CARE_PROVIDER_SITE_OTHER): Payer: Medicare Other | Admitting: Family

## 2021-10-16 VITALS — BP 128/66 | HR 71 | Temp 98.2°F | Ht 71.0 in | Wt 194.8 lb

## 2021-10-16 DIAGNOSIS — M25562 Pain in left knee: Secondary | ICD-10-CM

## 2021-10-16 DIAGNOSIS — M8580 Other specified disorders of bone density and structure, unspecified site: Secondary | ICD-10-CM

## 2021-10-16 DIAGNOSIS — N888 Other specified noninflammatory disorders of cervix uteri: Secondary | ICD-10-CM

## 2021-10-16 DIAGNOSIS — G8929 Other chronic pain: Secondary | ICD-10-CM | POA: Diagnosis not present

## 2021-10-16 DIAGNOSIS — Z780 Asymptomatic menopausal state: Secondary | ICD-10-CM

## 2021-10-16 DIAGNOSIS — Z136 Encounter for screening for cardiovascular disorders: Secondary | ICD-10-CM | POA: Diagnosis not present

## 2021-10-16 DIAGNOSIS — M25561 Pain in right knee: Secondary | ICD-10-CM

## 2021-10-16 DIAGNOSIS — Z124 Encounter for screening for malignant neoplasm of cervix: Secondary | ICD-10-CM | POA: Insufficient documentation

## 2021-10-16 DIAGNOSIS — Z0000 Encounter for general adult medical examination without abnormal findings: Secondary | ICD-10-CM | POA: Diagnosis not present

## 2021-10-16 DIAGNOSIS — Z13220 Encounter for screening for lipoid disorders: Secondary | ICD-10-CM

## 2021-10-16 NOTE — Progress Notes (Signed)
? ?Subjective:  ? ? Patient ID: Deborah Mcconnell, female    DOB: 11-Feb-1951, 71 y.o.   MRN: [id number] ? ?CC: Deborah Mcconnell is a 71 y.o. female who presents today for follow up.  ? ?HPI: Complains of bilateral knee pain, more noticeable last 6 months. Pain is not present unless during activity such as hiking. She has right lateral knee pain when she goes to stand; resolves with standing.  ?Going up and down steps is more difficult . Knees very rarely feel that they will give out. Getting up and down steps. Tried voltaren , aleve and blue icy hot with relief.  ?No calf swelling, known injury ?She walks a lot ? ? ? ? ? ?Follow up pap smear ?Previous pap smear  03/2020 showed inflammation and atropic changes. No hpv, malignancy ? ?H/o breast cancer ? ?HISTORY:  ?Past Medical History:  ?Diagnosis Date  ? Arthritis   ? osteoarthritis  ? Breast cancer, left ([facility]) 2014  ? Hx Lumpectomy and Rad tx's.  ? Hyperlipidemia   ? Osteopenia   ? previously taking Evista and Actonel  ? Osteoporosis   ? Vertigo   ? ?Past Surgical History:  ?Procedure Laterality Date  ? CHOLECYSTECTOMY    ? lt breast lumpectomy  2014  ? ?Family History  ?Problem Relation Age of Onset  ? Osteoporosis Mother   ? Hypertension Mother   ? Osteoarthritis Mother   ? Osteoporosis Sister   ? Osteoarthritis Sister   ? Osteoarthritis Maternal Grandmother   ? Diabetes Maternal Grandfather   ? Cancer Paternal Grandfather   ?     unknown  ? Prostate cancer Neg Hx   ? Bladder Cancer Neg Hx   ? Kidney cancer Neg Hx   ? ? ?Allergies: Ciprofloxacin, Ofloxacin, Quinolones, Actonel [risedronate], Augmentin [amoxicillin-pot clavulanate], and Bactrim [sulfamethoxazole-trimethoprim] ?Current Outpatient Medications on File Prior to Visit  ?Medication Sig Dispense Refill  ? Ascorbic Acid (VITAMIN C) 1000 MG tablet Take 1,000 mg by mouth daily.    ? aspirin EC 81 MG tablet Take 81 mg by mouth daily.    ? B Complex Vitamins (VITAMIN B COMPLEX PO) Take by mouth.    ?  Bacillus Coagulans-Inulin (PROBIOTIC) 1-250 BILLION-MG CAPS Take by mouth.    ? calcium carbonate (OS-CAL) 600 MG TABS tablet Take by mouth. Take 1,200 mg by mouth.    ? cephALEXin (KEFLEX) 250 MG capsule Take one after intercourse 90 capsule 1  ? chlorhexidine (HIBICLENS) 4 % external liquid Apply topically daily as needed. 120 mL 0  ? CRANBERRY PO Take by mouth.    ? diclofenac sodium (VOLTAREN) 1 % GEL Apply 4 g topically 4 (four) times daily. 1 Tube 3  ? ibuprofen (ADVIL,MOTRIN) 200 MG tablet Take 600 mg by mouth 2 (two) times daily. prn    ? meloxicam (MOBIC) 7.5 MG tablet Take 1 tablet (7.5 mg total) by mouth daily as needed for pain. 30 tablet 1  ? simvastatin (ZOCOR) 40 MG tablet TAKE 1 TABLET BY MOUTH AT BEDTIME 90 tablet 0  ? ?No current facility-administered medications on file prior to visit.  ? ? ?Social History  ? ?Tobacco Use  ? Smoking status: Never  ? Smokeless tobacco: Never  ?Vaping Use  ? Vaping Use: Never used  ?Substance Use Topics  ? Alcohol use: Yes  ?  Alcohol/week: 0.0 standard drinks  ?  Comment: rare  ? Drug use: No  ? ? ?Review of Systems  ?Constitutional:  Negative for  chills and fever.  ?Respiratory:  Negative for cough.   ?Cardiovascular:  Negative for chest pain and palpitations.  ?Gastrointestinal:  Negative for nausea and vomiting.  ?Musculoskeletal:  Positive for arthralgias. Negative for joint swelling.  ?   ?Objective:  ?  ?BP 128/66 (BP Location: Left Arm, Patient Position: Sitting, Cuff Size: Normal)   Pulse 71   Temp 98.2 ?F (36.8 ?C) (Oral)   Ht '5\' 11"'$  (1.803 m)   Wt 194 lb 12.8 oz (88.4 kg)   SpO2 98%   BMI 27.17 kg/m?  ?BP Readings from Last 3 Encounters:  ?10/16/21 128/66  ?04/17/21 126/78  ?01/21/21 130/80  ? ?Wt Readings from Last 3 Encounters:  ?10/16/21 194 lb 12.8 oz (88.4 kg)  ?08/27/21 191 lb (86.6 kg)  ?04/17/21 191 lb 9.6 oz (86.9 kg)  ? ? ?Physical Exam ?Vitals reviewed.  ?Constitutional:   ?   Appearance: She is well-developed.  ?Eyes:  ?    Conjunctiva/sclera: Conjunctivae normal.  ?Cardiovascular:  ?   Rate and Rhythm: Normal rate and regular rhythm.  ?   Pulses: Normal pulses.  ?   Heart sounds: Normal heart sounds.  ?Pulmonary:  ?   Effort: Pulmonary effort is normal.  ?   Breath sounds: Normal breath sounds. No wheezing, rhonchi or rales.  ?Genitourinary: ?   Labia:     ?   Right: No rash.     ?   Left: No rash.   ?   Vagina: No tenderness.  ?   Cervix: No cervical motion tenderness, erythema, cervical bleeding or eversion.  ?   Adnexa:     ?   Right: No tenderness.      ?   Left: No tenderness.    ?   Comments: Pap performed. Cervical os is not stenotic on exam ?Bimanuel exam performed without adnexal tenderness.  ?Musculoskeletal:  ?   Right knee: No swelling, effusion, erythema or bony tenderness. Normal range of motion.  ?   Left knee: No swelling, effusion, erythema or bony tenderness. Normal range of motion.  ?   Comments: Bilateral knees are symmetrically enlarged.  No effusion appreciated. No increase in warmth or erythema. Crepitus felt with flexion of bilateral knees. ? ?Bilateral knees:  ?Able to extend to -5 to 10 degrees and flex to 110 degrees. No catching with McMurray maneuver. No patellar apprehension. Negative anterior drawer and lachman's- no laxity appreciated. ? ?No calf tenderness of lower leg edema bilaterally.  ?  ?Skin: ?   General: Skin is warm and dry.  ?Neurological:  ?   Mental Status: She is alert.  ?Psychiatric:     ?   Speech: Speech normal.     ?   Behavior: Behavior normal.     ?   Thought Content: Thought content normal.  ? ? ?   ?Assessment & Plan:  ? ?Problem List Items Addressed This Visit   ? ?  ? Musculoskeletal and Integument  ? Osteopenia  ?  ? Genitourinary  ? Atrophy of cervix  ?  Pending repeat pap smear today.  ? ?  ?  ? Relevant Orders  ? Cytology - PAP  ?  ? Other  ? Bilateral knee pain - Primary  ?  Symptoms consistent with osteoarthritis versus chronic meniscal etiology or popliteal cyst. Previous  MRI left knee indicated meniscal etiology. Provided home exercises to strengthen quadriceps. Encouraged aggressive icing regimen, aleve prn, ace wrap. ?Consider orthopedics consult.  ? ?  ?  ?  Relevant Orders  ? Comprehensive metabolic panel  ? CBC with Differential/Platelet  ? DG Knee Complete 4 Views Right  ? DG Knee Complete 4 Views Left  ? Routine physical examination  ? ?Other Visit Diagnoses   ? ? Asymptomatic menopausal state      ? Encounter for lipid screening for cardiovascular disease      ? Relevant Orders  ? Lipid panel  ? ?  ? ? ? ?I am having Trinidad Curet "[name]" maintain her ibuprofen, calcium carbonate, diclofenac sodium, vitamin C, CRANBERRY PO, B Complex Vitamins (VITAMIN B COMPLEX PO), aspirin EC, Probiotic, cephALEXin, Hibiclens, meloxicam, and simvastatin. ? ? ?No orders of the defined types were placed in this encounter. ? ? ?Return precautions given.  ? ?Risks, benefits, and alternatives of the medications and treatment plan prescribed today were discussed, and patient expressed understanding.  ? ?Education regarding symptom management and diagnosis given to patient on AVS. ? ?Continue to follow with Burnard Hawthorne, FNP for routine health maintenance.  ? ?Wonda Amis and I agreed with plan.  ? ?Mable Paris, FNP ? ? ?

## 2021-10-16 NOTE — Assessment & Plan Note (Signed)
Pending repeat pap smear today.  ?

## 2021-10-16 NOTE — Patient Instructions (Addendum)
Vary exercise, consider water aerobics.  ?Icing after exercise ?Ace Wrap during the day ? ?Let me know how you are doing ? ?Knee Exercises ?Ask your health care provider which exercises are safe for you. Do exercises exactly as told by your health care provider and adjust them as directed. It is normal to feel mild stretching, pulling, tightness, or discomfort as you do these exercises. Stop right away if you feel sudden pain or your pain gets worse. Do not begin these exercises until told by your health care provider. ?Stretching and range-of-motion exercises ?These exercises warm up your muscles and joints and improve the movement and flexibility of your knee. These exercises also help to relieve pain and swelling. ?Knee extension, prone ? ?Lie on your abdomen (prone position) on a bed. ?Place your left / right knee just beyond the edge of the surface so your knee is not on the bed. You can put a towel under your left / right thigh just above your kneecap for comfort. ?Relax your leg muscles and allow gravity to straighten your knee (extension). You should feel a stretch behind your left / right knee. ?Hold this position for __________ seconds. ?Scoot up so your knee is supported between repetitions. ?Repeat __________ times. Complete this exercise __________ times a day. ?Knee flexion, active ? ?Lie on your back with both legs straight. If this causes back discomfort, bend your left / right knee so your foot is flat on the floor. ?Slowly slide your left / right heel back toward your buttocks. Stop when you feel a gentle stretch in the front of your knee or thigh (flexion). ?Hold this position for __________ seconds. ?Slowly slide your left / right heel back to the starting position. ?Repeat __________ times. Complete this exercise __________ times a day. ?Quadriceps stretch, prone ? ?Lie on your abdomen on a firm surface, such as a bed or padded floor. ?Bend your left / right knee and hold your ankle. If you  cannot reach your ankle or pant leg, loop a belt around your foot and grab the belt instead. ?Gently pull your heel toward your buttocks. Your knee should not slide out to the side. You should feel a stretch in the front of your thigh and knee (quadriceps). ?Hold this position for __________ seconds. ?Repeat __________ times. Complete this exercise __________ times a day. ?Hamstring, supine ? ?Lie on your back (supine position). ?Loop a belt or towel over the ball of your left / right foot. The ball of your foot is on the walking surface, right under your toes. ?Straighten your left / right knee and slowly pull on the belt to raise your leg until you feel a gentle stretch behind your knee (hamstring). ?Do not let your knee bend while you do this. ?Keep your other leg flat on the floor. ?Hold this position for __________ seconds. ?Repeat __________ times. Complete this exercise __________ times a day. ?Strengthening exercises ?These exercises build strength and endurance in your knee. Endurance is the ability to use your muscles for a long time, even after they get tired. ?Quadriceps, isometric ?This exercise strengthens the muscles in front of your thigh (quadriceps) without moving your knee joint (isometric). ?Lie on your back with your left / right leg extended and your other knee bent. Put a rolled towel or small pillow under your knee if told by your health care provider. ?Slowly tense the muscles in the front of your left / right thigh. You should see your kneecap slide up  toward your hip or see increased dimpling just above the knee. This motion will push the back of the knee toward the floor. ?For __________ seconds, hold the muscle as tight as you can without increasing your pain. ?Relax the muscles slowly and completely. ?Repeat __________ times. Complete this exercise __________ times a day. ?Straight leg raises ?This exercise strengthens the muscles in front of your thigh (quadriceps) and the muscles that  move your hips (hip flexors). ?Lie on your back with your left / right leg extended and your other knee bent. ?Tense the muscles in the front of your left / right thigh. You should see your kneecap slide up or see increased dimpling just above the knee. Your thigh may even shake a bit. ?Keep these muscles tight as you raise your leg 4-6 inches (10-15 cm) off the floor. Do not let your knee bend. ?Hold this position for __________ seconds. ?Keep these muscles tense as you lower your leg. ?Relax your muscles slowly and completely after each repetition. ?Repeat __________ times. Complete this exercise __________ times a day. ?Hamstring, isometric ? ?Lie on your back on a firm surface. ?Bend your left / right knee about __________ degrees. ?Dig your left / right heel into the surface as if you are trying to pull it toward your buttocks. Tighten the muscles in the back of your thighs (hamstring) to "dig" as hard as you can without increasing any pain. ?Hold this position for __________ seconds. ?Release the tension gradually and allow your muscles to relax completely for __________ seconds after each repetition. ?Repeat __________ times. Complete this exercise __________ times a day. ?Hamstring curls ?If told by your health care provider, do this exercise while wearing ankle weights. Begin with __________lb / kg weights. Then increase the weight by 1 lb (0.5 kg) increments. Do not wear ankle weights that are more than __________lb / kg. ?Lie on your abdomen with your legs straight. ?Bend your left / right knee as far as you can without feeling pain. Keep your hips flat against the floor. ?Hold this position for __________ seconds. ?Slowly lower your leg to the starting position. ?Repeat __________ times. Complete this exercise __________ times a day. ?Squats ?This exercise strengthens the muscles in front of your thigh and knee (quadriceps). ?Stand in front of a table, with your feet and knees pointing straight ahead.  You may rest your hands on the table for balance but not for support. ?Slowly bend your knees and lower your hips like you are going to sit in a chair. ?Keep your weight over your heels, not over your toes. ?Keep your lower legs upright so they are parallel with the table legs. ?Do not let your hips go lower than your knees. ?Do not bend lower than told by your health care provider. ?If your knee pain increases, do not bend as low. ?Hold the squat position for __________ seconds. ?Slowly push with your legs to return to standing. Do not use your hands to pull yourself to standing. ?Repeat __________ times. Complete this exercise __________ times a day. ?Wall slides ?This exercise strengthens the muscles in front of your thigh and knee (quadriceps). ?Lean your back against a smooth wall or door, and walk your feet out 18-24 inches (46-61 cm) from it. ?Place your feet hip-width apart. ?Slowly slide down the wall or door until your knees bend __________ degrees. Keep your knees over your heels, not over your toes. Keep your knees in line with your hips. ?Hold this position for  __________ seconds. ?Repeat __________ times. Complete this exercise __________ times a day. ?Straight leg raises, side-lying ?This exercise strengthens the muscles that rotate the leg at the hip and move it away from your body (hip abductors). ?Lie on your side with your left / right leg in the top position. Lie so your head, shoulder, knee, and hip line up. You may bend your bottom knee to help you keep your balance. ?Roll your hips slightly forward so your hips are stacked directly over each other and your left / right knee is facing forward. ?Leading with your heel, lift your top leg 4-6 inches (10-15 cm). You should feel the muscles in your outer hip lifting. ?Do not let your foot drift forward. ?Do not let your knee roll toward the ceiling. ?Hold this position for __________ seconds. ?Slowly return your leg to the starting position. ?Let  your muscles relax completely after each repetition. ?Repeat __________ times. Complete this exercise __________ times a day. ?Straight leg raises, prone ?This exercise stretches the muscles that move your h

## 2021-10-16 NOTE — Assessment & Plan Note (Signed)
Symptoms consistent with osteoarthritis versus chronic meniscal etiology or popliteal cyst. Previous MRI left knee indicated meniscal etiology. Provided home exercises to strengthen quadriceps. Encouraged aggressive icing regimen, aleve prn, ace wrap. ?Consider orthopedics consult.  ?

## 2021-10-17 ENCOUNTER — Other Ambulatory Visit: Payer: Self-pay | Admitting: Family

## 2021-10-17 MED ORDER — SIMVASTATIN 40 MG PO TABS
40.0000 mg | ORAL_TABLET | Freq: Every day | ORAL | 0 refills | Status: DC
Start: 2021-10-17 — End: 2022-01-06

## 2021-10-18 ENCOUNTER — Other Ambulatory Visit (INDEPENDENT_AMBULATORY_CARE_PROVIDER_SITE_OTHER): Payer: Medicare Other

## 2021-10-18 DIAGNOSIS — G8929 Other chronic pain: Secondary | ICD-10-CM | POA: Diagnosis not present

## 2021-10-18 DIAGNOSIS — M25562 Pain in left knee: Secondary | ICD-10-CM | POA: Diagnosis not present

## 2021-10-18 DIAGNOSIS — Z13220 Encounter for screening for lipoid disorders: Secondary | ICD-10-CM

## 2021-10-18 DIAGNOSIS — M25561 Pain in right knee: Secondary | ICD-10-CM

## 2021-10-18 DIAGNOSIS — Z136 Encounter for screening for cardiovascular disorders: Secondary | ICD-10-CM | POA: Diagnosis not present

## 2021-10-18 LAB — CYTOLOGY - PAP
Chlamydia: NEGATIVE
Comment: NEGATIVE
Comment: NEGATIVE
Comment: NORMAL
Diagnosis: NEGATIVE
Diagnosis: REACTIVE
Neisseria Gonorrhea: NEGATIVE
Trichomonas: NEGATIVE

## 2021-10-18 LAB — COMPREHENSIVE METABOLIC PANEL
ALT: 15 U/L (ref 0–35)
AST: 19 U/L (ref 0–37)
Albumin: 4 g/dL (ref 3.5–5.2)
Alkaline Phosphatase: 51 U/L (ref 39–117)
BUN: 16 mg/dL (ref 6–23)
CO2: 28 mEq/L (ref 19–32)
Calcium: 9.1 mg/dL (ref 8.4–10.5)
Chloride: 103 mEq/L (ref 96–112)
Creatinine, Ser: 0.75 mg/dL (ref 0.40–1.20)
GFR: 80.24 mL/min (ref >60.00)
Glucose, Bld: 93 mg/dL (ref 70–99)
Potassium: 3.8 mEq/L (ref 3.5–5.1)
Sodium: 138 mEq/L (ref 135–145)
Total Bilirubin: 0.5 mg/dL (ref 0.2–1.2)
Total Protein: 6.6 g/dL (ref 6.0–8.3)

## 2021-10-18 LAB — CBC WITH DIFFERENTIAL/PLATELET
Basophils Absolute: 0 10*3/uL (ref 0.0–0.1)
Basophils Relative: 0.9 % (ref 0.0–3.0)
Eosinophils Absolute: 0.2 10*3/uL (ref 0.0–0.7)
Eosinophils Relative: 3.6 % (ref 0.0–5.0)
HCT: 41.5 % (ref 36.0–46.0)
Hemoglobin: 14.1 g/dL (ref 12.0–15.0)
Lymphocytes Relative: 26.5 % (ref 12.0–46.0)
Lymphs Abs: 1.3 10*3/uL (ref 0.7–4.0)
MCHC: 33.9 g/dL (ref 30.0–36.0)
MCV: 95.7 fl (ref 78.0–100.0)
Monocytes Absolute: 0.4 10*3/uL (ref 0.1–1.0)
Monocytes Relative: 9.4 % (ref 3.0–12.0)
Neutro Abs: 2.8 10*3/uL (ref 1.4–7.7)
Neutrophils Relative %: 59.6 % (ref 43.0–77.0)
Platelets: 231 10*3/uL (ref 150.0–400.0)
RBC: 4.34 Mil/uL (ref 3.87–5.11)
RDW: 13.2 % (ref 11.5–15.5)
WBC: 4.8 10*3/uL (ref 4.0–10.5)

## 2021-10-18 LAB — LIPID PANEL
Cholesterol: 152 mg/dL (ref 0–200)
HDL: 59.4 mg/dL (ref >39.00)
LDL Cholesterol: 78 mg/dL (ref 0–99)
NonHDL: 93
Total CHOL/HDL Ratio: 3
Triglycerides: 77 mg/dL (ref 0.0–149.0)
VLDL: 15.4 mg/dL (ref 0.0–40.0)

## 2022-01-05 ENCOUNTER — Other Ambulatory Visit: Payer: Self-pay | Admitting: Family

## 2022-01-30 ENCOUNTER — Ambulatory Visit: Payer: Medicare Other | Admitting: Urology

## 2022-02-10 NOTE — Progress Notes (Signed)
11/29/19 12:00 PM   Deborah Mcconnell 1950-10-24 [id number]  Referring provider: Burnard Hawthorne, FNP [address] [place],  [place] [address]  Urological history: 1. rUTI's -contributing factors of age and vaginal atrophy -documented positive urine cultures over the last year  None  2. High risk hematuria -non-smoker -hematuria work up x 2 (2017, 2018)- bilateral parapelvic renal cysts -no reports of gross heme -UA ***  No chief complaint on file.    HPI: Deborah Mcconnell is a 71 y.o.female who presents today for yearly visit.    UA ***    PMH: Past Medical History:  Diagnosis Date   Arthritis    osteoarthritis   Breast cancer, left ([facility]) 2014   Hx Lumpectomy and Rad tx's.   Hyperlipidemia    Osteopenia    previously taking Evista and Actonel   Osteoporosis    Vertigo     Surgical History: Past Surgical History:  Procedure Laterality Date   CHOLECYSTECTOMY     lt breast lumpectomy  2014    Home Medications:  Current Outpatient Medications on File Prior to Visit  Medication Sig Dispense Refill   Ascorbic Acid (VITAMIN C) 1000 MG tablet Take 1,000 mg by mouth daily.     aspirin EC 81 MG tablet Take 81 mg by mouth daily.     B Complex Vitamins (VITAMIN B COMPLEX PO) Take by mouth.     Bacillus Coagulans-Inulin (PROBIOTIC) 1-250 BILLION-MG CAPS Take by mouth.     calcium carbonate (OS-CAL) 600 MG TABS tablet Take by mouth. Take 1,200 mg by mouth.     cephALEXin (KEFLEX) 250 MG capsule Take one after intercourse 90 capsule 1   chlorhexidine (HIBICLENS) 4 % external liquid Apply topically daily as needed. 120 mL 0   CRANBERRY PO Take by mouth.     diclofenac sodium (VOLTAREN) 1 % GEL Apply 4 g topically 4 (four) times daily. 1 Tube 3   ibuprofen (ADVIL,MOTRIN) 200 MG tablet Take 600 mg by mouth 2 (two) times daily. prn     meloxicam (MOBIC) 7.5 MG tablet Take 1 tablet (7.5 mg total) by mouth daily as needed for pain. 30 tablet 1    simvastatin (ZOCOR) 40 MG tablet TAKE 1 TABLET BY MOUTH AT BEDTIME 90 tablet 0   No current facility-administered medications on file prior to visit.    Allergies:  Allergies  Allergen Reactions   Ciprofloxacin Anaphylaxis    Causes death   Ofloxacin Anaphylaxis    Causes Death   Quinolones Anaphylaxis   Actonel [Risedronate]     Stomach pain   Augmentin [Amoxicillin-Pot Clavulanate] Nausea And Vomiting   Bactrim [Sulfamethoxazole-Trimethoprim] Hives    Itching, hives, and GI distress    Family History: Family History  Problem Relation Age of Onset   Osteoporosis Mother    Hypertension Mother    Osteoarthritis Mother    Osteoporosis Sister    Osteoarthritis Sister    Osteoarthritis Maternal Grandmother    Diabetes Maternal Grandfather    Cancer Paternal Grandfather        unknown   Prostate cancer Neg Hx    Bladder Cancer Neg Hx    Kidney cancer Neg Hx     Social History:  reports that she has never smoked. She has never used smokeless tobacco. She reports current alcohol use. She reports that she does not use drugs.  ROS For pertinent review of systems please refer to history of present illness  Physical Exam: There  were no vitals taken for this visit.  Constitutional:  Well nourished. Alert and oriented, No acute distress. HEENT: [place] AT, moist mucus membranes.  Trachea midline, no masses. Cardiovascular: No clubbing, cyanosis, or edema. Respiratory: Normal respiratory effort, no increased work of breathing. GI: Abdomen is soft, non tender, non distended, no abdominal masses. Liver and spleen not palpable.  No hernias appreciated.  Stool sample for occult testing is not indicated.   GU: No CVA tenderness.  No bladder fullness or masses.  *** external genitalia, *** pubic hair distribution, no lesions.  Normal urethral meatus, no lesions, no prolapse, no discharge.   No urethral masses, tenderness and/or tenderness. No bladder fullness, tenderness or masses. *** vagina  mucosa, *** estrogen effect, no discharge, no lesions, *** pelvic support, *** cystocele and *** rectocele noted.  No cervical motion tenderness.  Uterus is freely mobile and non-fixed.  No adnexal/parametria masses or tenderness noted.  Anus and perineum are without rashes or lesions.   ***  Skin: No rashes, bruises or suspicious lesions. Lymph: No cervical or inguinal adenopathy. Neurologic: Grossly intact, no focal deficits, moving all 4 extremities. Psychiatric: Normal mood and affect.    Laboratory Data:    Latest Ref Rng & Units 10/18/2021   10:31 AM 01/09/2021   11:22 AM 10/11/2019    8:53 AM  CMP  Glucose 70 - 99 mg/dL 93  78  108   BUN 6 - 23 mg/dL '16  22  15   '$ Creatinine 0.40 - 1.20 mg/dL 0.75  0.78  0.69   Sodium 135 - 145 mEq/L 138  141  141   Potassium 3.5 - 5.1 mEq/L 3.8  4.2  4.2   Chloride 96 - 112 mEq/L 103  105  105   CO2 19 - 32 mEq/L '28  30  30   '$ Calcium 8.4 - 10.5 mg/dL 9.1  9.8  9.5   Total Protein 6.0 - 8.3 g/dL 6.6  6.7  6.5   Total Bilirubin 0.2 - 1.2 mg/dL 0.5  0.4  0.5   Alkaline Phos 39 - 117 U/L 51  64  58   AST 0 - 37 U/L '19  21  19   '$ ALT 0 - 35 U/L '15  16  15        '$ Latest Ref Rng & Units 10/18/2021   10:31 AM 01/09/2021   11:22 AM 08/25/2019    9:35 AM  CBC  WBC 4.0 - 10.5 K/uL 4.8  5.7  4.8   Hemoglobin 12.0 - 15.0 g/dL 14.1  14.8  14.6   Hematocrit 36.0 - 46.0 % 41.5  43.1  43.5   Platelets 150.0 - 400.0 K/uL 231.0  242.0  235    Urinalysis ***   I have reviewed the labs.   Pertinent Imaging: No recent imaging   Assessment & Plan:    1. Recurrent UTIs -no UTI's over the last year -asymptomatic   2.  High risk hematuria -work up x 2 - NED  -No reports of gross hematuria -UA negative for micro heme  Deborah Council, PA-C [facility] [address] [place], [place] [address] [phone]

## 2022-02-11 ENCOUNTER — Ambulatory Visit (INDEPENDENT_AMBULATORY_CARE_PROVIDER_SITE_OTHER): Payer: Medicare Other | Admitting: Urology

## 2022-02-11 ENCOUNTER — Encounter: Payer: Self-pay | Admitting: Urology

## 2022-02-11 VITALS — BP 128/73 | HR 73 | Ht 71.0 in | Wt 189.0 lb

## 2022-02-11 DIAGNOSIS — R319 Hematuria, unspecified: Secondary | ICD-10-CM | POA: Diagnosis not present

## 2022-02-11 DIAGNOSIS — N390 Urinary tract infection, site not specified: Secondary | ICD-10-CM | POA: Diagnosis not present

## 2022-02-11 DIAGNOSIS — R3129 Other microscopic hematuria: Secondary | ICD-10-CM

## 2022-02-11 DIAGNOSIS — Z87440 Personal history of urinary (tract) infections: Secondary | ICD-10-CM

## 2022-02-11 LAB — URINALYSIS, COMPLETE
Bilirubin, UA: NEGATIVE
Glucose, UA: NEGATIVE
Ketones, UA: NEGATIVE
Leukocytes,UA: NEGATIVE
Nitrite, UA: NEGATIVE
Protein,UA: NEGATIVE
Specific Gravity, UA: 1.015 (ref 1.005–1.030)
Urobilinogen, Ur: 0.2 mg/dL (ref 0.2–1.0)
pH, UA: 6 (ref 5.0–7.5)

## 2022-02-11 LAB — MICROSCOPIC EXAMINATION

## 2022-02-11 MED ORDER — DIPHENHYDRAMINE HCL 50 MG PO TABS: ORAL_TABLET | ORAL | 0 refills | Status: DC

## 2022-02-11 MED ORDER — PREDNISONE 50 MG PO TABS: ORAL_TABLET | ORAL | 0 refills | Status: DC

## 2022-02-11 MED ORDER — CEPHALEXIN 250 MG PO CAPS: ORAL_CAPSULE | ORAL | 1 refills | Status: DC

## 2022-02-25 ENCOUNTER — Ambulatory Visit: Payer: Medicare Other

## 2022-02-28 ENCOUNTER — Ambulatory Visit
Admission: RE | Admit: 2022-02-28 | Discharge: 2022-02-28 | Disposition: A | Discharge status: Home / Self Care | Payer: Medicare Other | Source: Ambulatory Visit | Attending: Urology | Admitting: Urology

## 2022-02-28 DIAGNOSIS — R3129 Other microscopic hematuria: Secondary | ICD-10-CM

## 2022-02-28 DIAGNOSIS — N2889 Other specified disorders of kidney and ureter: Secondary | ICD-10-CM | POA: Diagnosis not present

## 2022-02-28 LAB — POCT I-STAT CREATININE: Creatinine, Ser: 0.7 mg/dL (ref 0.44–1.00)

## 2022-02-28 MED ORDER — IOHEXOL 300 MG/ML  SOLN
100.0000 mL | Freq: Once | INTRAMUSCULAR | Status: AC | PRN
  Administered 2022-02-28: 100 mL via INTRAVENOUS

## 2022-03-18 ENCOUNTER — Ambulatory Visit (INDEPENDENT_AMBULATORY_CARE_PROVIDER_SITE_OTHER): Payer: Medicare Other | Admitting: Urology

## 2022-03-18 ENCOUNTER — Encounter: Payer: Self-pay | Admitting: Urology

## 2022-03-18 VITALS — BP 119/74 | HR 73 | Ht 71.0 in | Wt 192.0 lb

## 2022-03-18 DIAGNOSIS — N390 Urinary tract infection, site not specified: Secondary | ICD-10-CM

## 2022-03-18 DIAGNOSIS — R3129 Other microscopic hematuria: Secondary | ICD-10-CM | POA: Diagnosis not present

## 2022-03-18 DIAGNOSIS — R300 Dysuria: Secondary | ICD-10-CM

## 2022-03-18 DIAGNOSIS — Z87440 Personal history of urinary (tract) infections: Secondary | ICD-10-CM | POA: Diagnosis not present

## 2022-03-18 LAB — URINALYSIS, COMPLETE
Bilirubin, UA: NEGATIVE
Glucose, UA: NEGATIVE
Ketones, UA: NEGATIVE
Nitrite, UA: NEGATIVE
Protein,UA: NEGATIVE
Specific Gravity, UA: 1.025 (ref 1.005–1.030)
Urobilinogen, Ur: 0.2 mg/dL (ref 0.2–1.0)
pH, UA: 5 (ref 5.0–7.5)

## 2022-03-18 LAB — MICROSCOPIC EXAMINATION

## 2022-03-18 NOTE — Progress Notes (Signed)
03/18/2022 1:52 PM   Deborah Mcconnell 19-May-1951 [id number]  Referring provider: Burnard Hawthorne, FNP [address] [place],  [place] [address]  Chief Complaint  Patient presents with   Cysto    HPI: 71 year old female with personal history of microscopic hematuria and   PMH: Past Medical History:  Diagnosis Date   Arthritis    osteoarthritis   Breast cancer, left ([facility]) 2014   Hx Lumpectomy and Rad tx's.   Hyperlipidemia    Osteopenia    previously taking Evista and Actonel   Osteoporosis    Vertigo     Surgical History: Past Surgical History:  Procedure Laterality Date   CHOLECYSTECTOMY     lt breast lumpectomy  2014    Home Medications:  Allergies as of 03/18/2022       Reactions   Ciprofloxacin Anaphylaxis   Causes death   Ofloxacin Anaphylaxis   Causes Death Other reaction(s): Not available   Quinolones Anaphylaxis   Actonel [risedronate]    Stomach pain   Augmentin [amoxicillin-pot Clavulanate] Nausea And Vomiting   Other Other (See Comments)   Bactrim [sulfamethoxazole-trimethoprim] Hives   Itching, hives, and GI distress        Medication List        Accurate as of March 18, 2022  1:52 PM. If you have any questions, ask your nurse or doctor.          STOP taking these medications    predniSONE 50 MG tablet Commonly known as: DELTASONE Stopped by: [name], MD       TAKE these medications    aspirin EC 81 MG tablet Take 81 mg by mouth daily.   calcium carbonate 600 MG Tabs tablet Commonly known as: OS-CAL Take by mouth. Take 1,200 mg by mouth.   cephALEXin 250 MG capsule Commonly known as: Keflex Take one after intercourse   CRANBERRY PO Take by mouth.   diclofenac sodium 1 % Gel Commonly known as: VOLTAREN Apply 4 g topically 4 (four) times daily.   diphenhydrAMINE 50 MG tablet Commonly known as: BENADRYL Take one tablet one hour prior to the CT   Hibiclens 4 % external  liquid Generic drug: chlorhexidine Apply topically daily as needed.   ibuprofen 200 MG tablet Commonly known as: ADVIL Take 600 mg by mouth 2 (two) times daily. prn   meloxicam 7.5 MG tablet Commonly known as: MOBIC Take 1 tablet (7.5 mg total) by mouth daily as needed for pain.   Probiotic 1-250 BILLION-MG Caps Take by mouth.   simvastatin 40 MG tablet Commonly known as: ZOCOR TAKE 1 TABLET BY MOUTH AT BEDTIME   VITAMIN B COMPLEX PO Take by mouth.   vitamin C 1000 MG tablet Take 1,000 mg by mouth daily.        Allergies:  Allergies  Allergen Reactions   Ciprofloxacin Anaphylaxis    Causes death   Ofloxacin Anaphylaxis    Causes Death Other reaction(s): Not available   Quinolones Anaphylaxis   Actonel [Risedronate]     Stomach pain   Augmentin [Amoxicillin-Pot Clavulanate] Nausea And Vomiting   Other Other (See Comments)   Bactrim [Sulfamethoxazole-Trimethoprim] Hives    Itching, hives, and GI distress    Family History: Family History  Problem Relation Age of Onset   Osteoporosis Mother    Hypertension Mother    Osteoarthritis Mother    Osteoporosis Sister    Osteoarthritis Sister    Osteoarthritis Maternal Grandmother    Diabetes Maternal  Grandfather    Cancer Paternal Grandfather        unknown   Prostate cancer Neg Hx    Bladder Cancer Neg Hx    Kidney cancer Neg Hx     Social History:  reports that she has never smoked. She has never used smokeless tobacco. She reports current alcohol use. She reports that she does not use drugs.   Physical Exam: Ht '5\' 11"'$  (1.803 m)   BMI 26.36 kg/m   Constitutional:  Alert and oriented, No acute distress. HEENT: [place] AT, moist mucus membranes.  Trachea midline, no masses. Cardiovascular: No clubbing, cyanosis, or edema. Respiratory: Normal respiratory effort, no increased work of breathing. GI: Abdomen is soft, nontender, nondistended, no abdominal masses GU: No CVA tenderness Skin: No rashes, bruises or  suspicious lesions. Neurologic: Grossly intact, no focal deficits, moving all 4 extremities. Psychiatric: Normal mood and affect.  Laboratory Data: Lab Results  Component Value Date   WBC 4.8 10/18/2021   HGB 14.1 10/18/2021   HCT 41.5 10/18/2021   MCV 95.7 10/18/2021   PLT 231.0 10/18/2021    Lab Results  Component Value Date   CREATININE 0.70 02/28/2022    No results found for: "PSA"  No results found for: "TESTOSTERONE"  Lab Results  Component Value Date   HGBA1C 5.6 09/21/2017    Urinalysis    Component Value Date/Time   COLORURINE YELLOW 02/03/2017 0817   APPEARANCEUR Clear 02/11/2022 0951   LABSPEC <=1.005 (A) 02/03/2017 0817   PHURINE 6.0 02/03/2017 0817   GLUCOSEU Negative 02/11/2022 0951   GLUCOSEU NEGATIVE 02/03/2017 0817   HGBUR SMALL (A) 02/03/2017 0817   BILIRUBINUR Negative 02/11/2022 [place] 02/03/2017 0817   PROTEINUR Negative 02/11/2022 0951   UROBILINOGEN 0.2 02/03/2017 0817   NITRITE Negative 02/11/2022 0951   NITRITE NEGATIVE 02/03/2017 0817   LEUKOCYTESUR Negative 02/11/2022 0951    Lab Results  Component Value Date   LABMICR See below: 02/11/2022   WBCUA 0-5 02/11/2022   RBCUA 0-2 02/24/2017   LABEPIT 0-10 02/11/2022   MUCUS Present (A) 02/11/2022   BACTERIA Few 02/11/2022    Pertinent Imaging: *** No results found for this or any previous visit.  No results found for this or any previous visit.  No results found for this or any previous visit.  No results found for this or any previous visit.  Results for orders placed during the hospital encounter of 03/19/16  US Renal  Narrative CLINICAL DATA:  Microscopic hematuria.  EXAM: RENAL / URINARY TRACT ULTRASOUND COMPLETE  COMPARISON:  CT scan of February 25, 2013.  FINDINGS: Right Kidney:  Length: 12.3 cm. Parapelvic cysts are noted with the largest measuring 1.6 cm. Echogenicity within normal limits. No mass or hydronephrosis visualized.  Left  Kidney:  Length: 11.5 cm. Parapelvic cysts are noted with the largest measuring 2.1 cm. Echogenicity within normal limits. No mass or hydronephrosis visualized.  Bladder:  Appears normal for degree of bladder distention. Calculated prevoid volume of 595 mL. Calculated postvoid volume of 20 mL.  IMPRESSION: Bilateral parapelvic cysts. No other significant abnormality seen in the kidneys.   Electronically Signed By: Marijo Conception, M.D. On: 03/19/2016 16:48  No results found for this or any previous visit.  Results for orders placed during the hospital encounter of 02/28/22  CT HEMATURIA WORKUP  Narrative CLINICAL DATA:  Microscopic hematuria. History of left breast cancer.  EXAM: CT ABDOMEN AND PELVIS WITHOUT AND WITH CONTRAST  TECHNIQUE: Multidetector CT  imaging of the abdomen and pelvis was performed following the standard protocol before and following the bolus administration of intravenous contrast.  RADIATION DOSE REDUCTION: This exam was performed according to the departmental dose-optimization program which includes automated exposure control, adjustment of the mA and/or kV according to patient size and/or use of iterative reconstruction technique.  CONTRAST:  15m OMNIPAQUE IOHEXOL 300 MG/ML  SOLN  COMPARISON:  CT Nov 20, 2016  FINDINGS: Lower chest: No acute abnormality.  Hepatobiliary: Small cyst in the dome of the liver. Additional tiny hypodense hepatic lesions are technically too small to accurately characterize but statistically likely reflect cysts. Gallbladder surgically absent. Similar mild central intra and extrahepatic biliary ductal dilation is favored a combination of senescent change and reservoir effect post cholecystectomy.  Pancreas: No pancreatic ductal dilation or evidence of acute inflammation.  Spleen: No splenomegaly or focal splenic lesion.  Adrenals/Urinary Tract: Bilateral adrenal glands appear normal. No hydronephrosis.  No renal, ureteral or bladder calculi.  Bilateral renal sinus cysts are considered benign requiring no independent imaging follow-up. No solid enhancing renal mass.  Kidneys demonstrate symmetric enhancement and excretion of contrast material. No collecting system duplication. No suspicious filling defect identified within the opacified portions of the collecting systems or ureters on delayed imaging.  Urinary bladder is unremarkable for degree of distension without focal wall thickening or suspicious intraluminal filling defect identified.  Stomach/Bowel: No radiopaque enteric contrast material was administered. Stomach is unremarkable for degree of distension. No pathologic dilation of small or large bowel. The appendix and terminal ileum appear normal. Left-sided colonic diverticulosis without findings of acute diverticulitis.  Vascular/Lymphatic: Scattered aortic atherosclerosis. No abdominal aortic aneurysm. No pathologically enlarged abdominal or pelvic lymph nodes.  Reproductive: Lobular uterine contour likely reflects uterine leiomyomas. No suspicious adnexal mass.  Other: No significant abdominopelvic free fluid.  Musculoskeletal: Multilevel degenerative changes spine.  IMPRESSION: 1. No hydronephrosis.  No renal, ureteral or bladder calculi. 2. No solid enhancing renal mass. 3. Left-sided colonic diverticulosis without findings of acute diverticulitis. 4. Lobular uterine contour likely reflects uterine leiomyomas. 5.  Aortic Atherosclerosis (ICD10-I70.0).   Electronically Signed By: JDahlia BailiffM.D. On: 02/28/2022 16:16  No results found for this or any previous visit.   Assessment & Plan:    1. Recurrent UTI *** - Urinalysis, Complete  2. Microscopic hematuria *** - Urinalysis, Complete   No follow-ups on file.  AHollice Espy MD  [facility] [address] [place] [phone]

## 2022-03-21 ENCOUNTER — Other Ambulatory Visit: Payer: Self-pay | Admitting: Family Medicine

## 2022-03-21 LAB — CULTURE, URINE COMPREHENSIVE

## 2022-03-21 MED ORDER — NITROFURANTOIN MONOHYD MACRO 100 MG PO CAPS: 100.0000 mg | ORAL_CAPSULE | Freq: Two times a day (BID) | ORAL | 0 refills | Status: DC

## 2022-03-21 NOTE — Telephone Encounter (Signed)
Patient called requesting the result for the urine culture. I spoke to Sam and per the culture Macrobid was sent to the pharmacy.

## 2022-03-22 ENCOUNTER — Encounter: Payer: Self-pay | Admitting: Urology

## 2022-03-26 DIAGNOSIS — M8589 Other specified disorders of bone density and structure, multiple sites: Secondary | ICD-10-CM | POA: Diagnosis not present

## 2022-03-26 DIAGNOSIS — Z23 Encounter for immunization: Secondary | ICD-10-CM | POA: Diagnosis not present

## 2022-03-31 ENCOUNTER — Other Ambulatory Visit: Payer: Self-pay | Admitting: Family

## 2022-04-02 ENCOUNTER — Ambulatory Visit (INDEPENDENT_AMBULATORY_CARE_PROVIDER_SITE_OTHER): Payer: Medicare Other | Admitting: Urology

## 2022-04-02 ENCOUNTER — Encounter: Payer: Self-pay | Admitting: Urology

## 2022-04-02 ENCOUNTER — Other Ambulatory Visit: Payer: Self-pay | Admitting: Urology

## 2022-04-02 VITALS — BP 127/79 | HR 79 | Ht 71.0 in | Wt 193.0 lb

## 2022-04-02 DIAGNOSIS — N3289 Other specified disorders of bladder: Secondary | ICD-10-CM

## 2022-04-02 DIAGNOSIS — R3129 Other microscopic hematuria: Secondary | ICD-10-CM

## 2022-04-02 DIAGNOSIS — Z87440 Personal history of urinary (tract) infections: Secondary | ICD-10-CM

## 2022-04-02 NOTE — Progress Notes (Signed)
Surgical Physician Order Form [facility]  * Scheduling expectation : Next Available  *Length of Case:   *Clearance needed: no  *Anticoagulation Instructions: Hold all anticoagulants  *Aspirin Instructions: Ok to continue Aspirin  *Post-op visit Date/Instructions:   will call with results  *Diagnosis:  bladder erythema  *Procedure: bilateral RTG, Cysto Bladder Biopsy (33832)   Additional orders: N/A  -Admit type: OUTpatient  -Anesthesia: General  -VTE Prophylaxis Standing Order SCD's       Other:   -Standing Lab Orders Per Anesthesia    Lab other: None  -Standing Test orders EKG/Chest x-ray per Anesthesia       Test other:   - Medications:  Ancef 2gm IV  -Other orders:  N/A

## 2022-04-02 NOTE — H&P (View-Only) (Signed)
   04/02/22  CC:  Chief Complaint  Patient presents with   Cysto    HPI: 71 year old female with a personal history of recurrent UTIs who presents today for cystoscopy.  UA today with 3-10 RBC, 10 WBC  She is asymptomatic   Height '5\' 11"'$  (1.803 m). NED. A&Ox3.   No respiratory distress   Abd soft, NT, ND Normal external genitalia with patent urethral meatus  Cystoscopy Procedure Note  Patient identification was confirmed, informed consent was obtained, and patient was prepped using Betadine solution.  Lidocaine jelly was administered per urethral meatus.    Procedure: - Flexible cystoscope introduced, without any difficulty.   - Thorough search of the bladder revealed:    normal urethral meatus    Mildly hypervascular bladder with nonspecific subtile patchy  erythema at right lateral and posterior bladder wall    no stones    no ulcers     no tumors    no urethral polyps    no trabeculation  - Ureteral orifices were normal in position and appearance.  Post-Procedure: - Patient tolerated the procedure well  Assessment/ Plan:  1. Erythematous bladder mucosa Nonspecific.  CIS vs. Inflammatory as ddx  Discussed consideration of bladder biopsy with bilateral retrogrades.  Risk including risk of bleeding, infection, damage to surrounding structures reviewed.  All questions answered  Preop UCx  2. Microscopic hematuria As above - Urinalysis, Complete - CULTURE, URINE COMPREHENSIVE    [name], MD

## 2022-04-02 NOTE — Progress Notes (Signed)
   04/02/22  CC:  Chief Complaint  Patient presents with   Cysto    HPI: 71 year old female with a personal history of recurrent UTIs who presents today for cystoscopy.  UA today with 3-10 RBC, 10 WBC  She is asymptomatic   Height '5\' 11"'$  (1.803 m). NED. A&Ox3.   No respiratory distress   Abd soft, NT, ND Normal external genitalia with patent urethral meatus  Cystoscopy Procedure Note  Patient identification was confirmed, informed consent was obtained, and patient was prepped using Betadine solution.  Lidocaine jelly was administered per urethral meatus.    Procedure: - Flexible cystoscope introduced, without any difficulty.   - Thorough search of the bladder revealed:    normal urethral meatus    Mildly hypervascular bladder with nonspecific subtile patchy  erythema at right lateral and posterior bladder wall    no stones    no ulcers     no tumors    no urethral polyps    no trabeculation  - Ureteral orifices were normal in position and appearance.  Post-Procedure: - Patient tolerated the procedure well  Assessment/ Plan:  1. Erythematous bladder mucosa Nonspecific.  CIS vs. Inflammatory as ddx  Discussed consideration of bladder biopsy with bilateral retrogrades.  Risk including risk of bleeding, infection, damage to surrounding structures reviewed.  All questions answered  Preop UCx  2. Microscopic hematuria As above - Urinalysis, Complete - CULTURE, URINE COMPREHENSIVE    [name], MD

## 2022-04-03 LAB — URINALYSIS, COMPLETE
Bilirubin, UA: NEGATIVE
Glucose, UA: NEGATIVE
Ketones, UA: NEGATIVE
Nitrite, UA: NEGATIVE
Protein,UA: NEGATIVE
Specific Gravity, UA: <1.005 — ABNORMAL LOW (ref 1.005–1.030)
Urobilinogen, Ur: 0.2 mg/dL (ref 0.2–1.0)
pH, UA: 6.5 (ref 5.0–7.5)

## 2022-04-03 LAB — MICROSCOPIC EXAMINATION

## 2022-04-06 LAB — CULTURE, URINE COMPREHENSIVE

## 2022-04-10 ENCOUNTER — Telehealth: Payer: Self-pay

## 2022-04-10 NOTE — Telephone Encounter (Signed)
I spoke with Deborah Mcconnell. We have discussed possible surgery dates and Monday October 30th, 2023 was agreed upon by all parties. Patient given information about surgery date, what to expect pre-operatively and post operatively.  We discussed that a Pre-Admission Testing office will be calling to set up the pre-op visit that will take place prior to surgery, and that these appointments are typically done over the phone with a Pre-Admissions RN.  Informed patient that our office will communicate any additional care to be provided after surgery. Patients questions or concerns were discussed during our call. Advised to call our office should there be any additional information, questions or concerns that arise. Patient verbalized understanding.

## 2022-04-10 NOTE — Progress Notes (Signed)
[facility] Posting Form   Surgery Date/Time: Date: 04/28/2022  Surgeon: Dr. [name], MD  Surgery Location: Day Surgery  Inpt ( No  )   Outpt (Yes)   Obs ( No  )   Diagnosis: N32.89 Bladder Erythema  -CPT: 62952, [id number]  Surgery: Cystoscopy with Bladder Biopsy and bilateral Retrograde Pyelograms  Stop Anticoagulations: Yes, may continue ASA  Cardiac/Medical/Pulmonary Clearance needed: no  *Orders entered into EPIC  Date: 04/10/22   *Case booked in EPIC  Date: 04/04/2022  *Notified pt of Surgery: Date: 04/04/2022  PRE-OP UA & CX: no  *Placed into Prior Authorization Work Que Date: 04/10/22   Assistant/laser/rep:No

## 2022-04-14 DIAGNOSIS — Z23 Encounter for immunization: Secondary | ICD-10-CM | POA: Diagnosis not present

## 2022-04-17 ENCOUNTER — Other Ambulatory Visit: Payer: Medicare Other

## 2022-04-17 DIAGNOSIS — Z1231 Encounter for screening mammogram for malignant neoplasm of breast: Secondary | ICD-10-CM | POA: Diagnosis not present

## 2022-04-17 DIAGNOSIS — Z9221 Personal history of antineoplastic chemotherapy: Secondary | ICD-10-CM | POA: Diagnosis not present

## 2022-04-17 DIAGNOSIS — Z853 Personal history of malignant neoplasm of breast: Secondary | ICD-10-CM | POA: Diagnosis not present

## 2022-04-17 DIAGNOSIS — Z170 Estrogen receptor positive status [ER+]: Secondary | ICD-10-CM | POA: Diagnosis not present

## 2022-04-17 DIAGNOSIS — Z923 Personal history of irradiation: Secondary | ICD-10-CM | POA: Diagnosis not present

## 2022-04-17 DIAGNOSIS — C50312 Malignant neoplasm of lower-inner quadrant of left female breast: Secondary | ICD-10-CM | POA: Diagnosis not present

## 2022-04-17 DIAGNOSIS — Z9889 Other specified postprocedural states: Secondary | ICD-10-CM | POA: Diagnosis not present

## 2022-04-17 DIAGNOSIS — N3289 Other specified disorders of bladder: Secondary | ICD-10-CM | POA: Diagnosis not present

## 2022-04-17 DIAGNOSIS — R92323 Mammographic fibroglandular density, bilateral breasts: Secondary | ICD-10-CM | POA: Diagnosis not present

## 2022-04-18 ENCOUNTER — Encounter
Admission: RE | Admit: 2022-04-18 | Discharge: 2022-04-18 | Disposition: A | Discharge status: Home / Self Care | Payer: Medicare Other | Source: Ambulatory Visit | Attending: Urology | Admitting: Urology

## 2022-04-18 ENCOUNTER — Other Ambulatory Visit: Payer: Self-pay

## 2022-04-18 VITALS — Ht 71.0 in | Wt 187.0 lb

## 2022-04-18 DIAGNOSIS — Z01818 Encounter for other preprocedural examination: Secondary | ICD-10-CM

## 2022-04-18 HISTORY — DX: Other specified postprocedural states: Z98.890

## 2022-04-18 HISTORY — DX: Palpitations: R00.2

## 2022-04-18 HISTORY — DX: Nausea with vomiting, unspecified: R11.2

## 2022-04-18 NOTE — Patient Instructions (Addendum)
Your procedure is scheduled on:10/ 30/2023  Report to the Registration Desk on the 1st floor of the [facility]. To find out your arrival time, please call [phone] between 1PM - 3PM on: 10/27/ 2023  If your arrival time is 6:00 am, do not arrive prior to that time as the [facility] entrance doors do not open until 6:00 am.  REMEMBER: Instructions that are not followed completely may result in serious medical risk, up to and including death; or upon the discretion of your surgeon and anesthesiologist your surgery may need to be rescheduled.  Do not eat food after midnight the night before surgery.  No gum chewing, lozengers or hard candies.  Follow recommendations from Cardiologist, Pulmonologist or PCP regarding stopping Aspirin.   One week prior to surgery: Stop Anti-inflammatories (NSAIDS) such as Advil, Aleve, Ibuprofen, Motrin, Naproxen, Naprosyn and Aspirin based products such as Excedrin, Goodys Powder, BC Powder, and meloxicam Stop ANY OVER THE COUNTER supplements until after surgery like vitamins B, calcium, cranberry and etc... You may however, continue to take Tylenol if needed for pain up until the day of surgery.  No Alcohol for 24 hours before or after surgery.  No Smoking including e-cigarettes for 24 hours prior to surgery.  No chewable tobacco products for at least 6 hours prior to surgery.  No nicotine patches on the day of surgery.  Do not use any "recreational" drugs for at least a week prior to your surgery.  Please be advised that the combination of cocaine and anesthesia may have negative outcomes, up to and including death. If you test positive for cocaine, your surgery will be cancelled.  On the morning of surgery brush your teeth with toothpaste and water, you may rinse your mouth with mouthwash if you wish. Do not swallow any toothpaste or mouthwash.    You may shower the day of surgery.  Do not wear jewelry, make-up, hairpins, clips or nail  polish.  Do not wear lotions, powders, or perfumes.   Do not shave body from the neck down 48 hours prior to surgery just in case you cut yourself which could leave a site for infection.  Also, freshly shaved skin may become irritated if using the CHG soap.  Contact lenses, hearing aids and dentures may not be worn into surgery.  Do not bring valuables to the hospital. [facility] is not responsible for any missing/lost belongings or valuables.   Notify your doctor if there is any change in your medical condition (cold, fever, infection).  Wear comfortable clothing (specific to your surgery type) to the hospital.   After surgery, you can help prevent lung complications by doing breathing exercises.  Take deep breaths and cough every 1-2 hours. Your doctor may order a device called an Incentive Spirometer to help you take deep breaths.  If you are being admitted to the hospital overnight, leave your suitcase in the car. After surgery it may be brought to your room.  If you are being discharged the day of surgery, you will not be allowed to drive home. You will need a responsible adult (18 years or older) to drive you home and stay with you that night.   If you are taking public transportation, you will need to have a responsible adult (18 years or older) with you. Please confirm with your physician that it is acceptable to use public transportation.   Please call the [facility] Dept. at [phone] if you have any questions about these  instructions.  Surgery Visitation Policy:  Patients undergoing a surgery or procedure may have two family members or support persons with them as long as the person is not COVID-19 positive or experiencing its symptoms.   Inpatient Visitation:    Visiting hours are 7 a.m. to 8 p.m. Up to four visitors are allowed at one time in a patient room, including children. The visitors may rotate out with other people during the day. One  designated support person (adult) may remain overnight.

## 2022-04-22 ENCOUNTER — Encounter
Admission: RE | Admit: 2022-04-22 | Discharge: 2022-04-22 | Disposition: A | Discharge status: Home / Self Care | Payer: Medicare Other | Source: Ambulatory Visit | Attending: Urology | Admitting: Urology

## 2022-04-22 DIAGNOSIS — Z01810 Encounter for preprocedural cardiovascular examination: Secondary | ICD-10-CM | POA: Diagnosis not present

## 2022-04-22 DIAGNOSIS — Z01818 Encounter for other preprocedural examination: Secondary | ICD-10-CM

## 2022-04-28 ENCOUNTER — Ambulatory Visit: Payer: Medicare Other

## 2022-04-28 ENCOUNTER — Encounter
Admission: RE | Disposition: A | Discharge status: Home / Self Care | Payer: Self-pay | Source: Home / Self Care | Attending: Urology

## 2022-04-28 ENCOUNTER — Ambulatory Visit
Admission: RE | Admit: 2022-04-28 | Discharge: 2022-04-28 | Disposition: A | Discharge status: Home / Self Care | Payer: Medicare Other | Attending: Urology | Admitting: Urology

## 2022-04-28 ENCOUNTER — Encounter: Payer: Self-pay | Admitting: Urology

## 2022-04-28 ENCOUNTER — Other Ambulatory Visit: Payer: Self-pay

## 2022-04-28 DIAGNOSIS — R310 Gross hematuria: Secondary | ICD-10-CM

## 2022-04-28 DIAGNOSIS — Z87440 Personal history of urinary (tract) infections: Secondary | ICD-10-CM | POA: Diagnosis not present

## 2022-04-28 DIAGNOSIS — R3129 Other microscopic hematuria: Secondary | ICD-10-CM | POA: Insufficient documentation

## 2022-04-28 DIAGNOSIS — Z01818 Encounter for other preprocedural examination: Secondary | ICD-10-CM

## 2022-04-28 DIAGNOSIS — N3289 Other specified disorders of bladder: Secondary | ICD-10-CM | POA: Diagnosis not present

## 2022-04-28 DIAGNOSIS — E785 Hyperlipidemia, unspecified: Secondary | ICD-10-CM | POA: Diagnosis not present

## 2022-04-28 DIAGNOSIS — N329 Bladder disorder, unspecified: Secondary | ICD-10-CM | POA: Diagnosis not present

## 2022-04-28 HISTORY — PX: CYSTOSCOPY WITH BIOPSY: SHX5122

## 2022-04-28 HISTORY — PX: CYSTOSCOPY W/ RETROGRADES: SHX1426

## 2022-04-28 LAB — BASIC METABOLIC PANEL
Anion gap: 6 (ref 5–15)
BUN: 18 mg/dL (ref 8–23)
CO2: 27 mmol/L (ref 22–32)
Calcium: 9.1 mg/dL (ref 8.9–10.3)
Chloride: 108 mmol/L (ref 98–111)
Creatinine, Ser: 0.66 mg/dL (ref 0.44–1.00)
GFR, Estimated: >60 mL/min (ref >60)
Glucose, Bld: 110 mg/dL — ABNORMAL HIGH (ref 70–99)
Potassium: 3.9 mmol/L (ref 3.5–5.1)
Sodium: 141 mmol/L (ref 135–145)

## 2022-04-28 LAB — CBC
HCT: 42.5 % (ref 36.0–46.0)
Hemoglobin: 14.6 g/dL (ref 12.0–15.0)
MCH: 32.2 pg (ref 26.0–34.0)
MCHC: 34.4 g/dL (ref 30.0–36.0)
MCV: 93.8 fL (ref 80.0–100.0)
Platelets: 255 10*3/uL (ref 150–400)
RBC: 4.53 MIL/uL (ref 3.87–5.11)
RDW: 12.3 % (ref 11.5–15.5)
WBC: 5.4 10*3/uL (ref 4.0–10.5)
nRBC: 0 % (ref 0.0–0.2)

## 2022-04-28 SURGERY — CYSTOSCOPY, WITH BIOPSY
Anesthesia: General | Site: Bladder

## 2022-04-28 MED ORDER — FAMOTIDINE 20 MG PO TABS
ORAL_TABLET | ORAL | Status: AC
  Administered 2022-04-28: 20 mg via ORAL
  Filled 2022-04-28: qty 1

## 2022-04-28 MED ORDER — CHLORHEXIDINE GLUCONATE 0.12 % MT SOLN
OROMUCOSAL | Status: AC
  Administered 2022-04-28: 15 mL via OROMUCOSAL
  Filled 2022-04-28: qty 15

## 2022-04-28 MED ORDER — SEVOFLURANE IN SOLN
RESPIRATORY_TRACT | Status: AC
  Filled 2022-04-28: qty 250

## 2022-04-28 MED ORDER — ONDANSETRON HCL 4 MG/2ML IJ SOLN
INTRAMUSCULAR | Status: DC | PRN
  Administered 2022-04-28: 4 mg via INTRAVENOUS

## 2022-04-28 MED ORDER — MIDAZOLAM HCL 2 MG/2ML IJ SOLN
INTRAMUSCULAR | Status: DC | PRN
  Administered 2022-04-28 (×2): 1 mg via INTRAVENOUS

## 2022-04-28 MED ORDER — PROPOFOL 10 MG/ML IV BOLUS
INTRAVENOUS | Status: DC | PRN
  Administered 2022-04-28: 150 mg via INTRAVENOUS

## 2022-04-28 MED ORDER — FENTANYL CITRATE (PF) 100 MCG/2ML IJ SOLN
INTRAMUSCULAR | Status: AC
  Filled 2022-04-28: qty 2

## 2022-04-28 MED ORDER — KETAMINE HCL 10 MG/ML IJ SOLN
INTRAMUSCULAR | Status: DC | PRN
  Administered 2022-04-28: 20 mg via INTRAVENOUS

## 2022-04-28 MED ORDER — PROPOFOL 1000 MG/100ML IV EMUL
INTRAVENOUS | Status: AC
  Filled 2022-04-28: qty 100

## 2022-04-28 MED ORDER — STERILE WATER FOR IRRIGATION IR SOLN
Status: DC | PRN
  Administered 2022-04-28: 1500 mL

## 2022-04-28 MED ORDER — IOHEXOL 180 MG/ML  SOLN
INTRAMUSCULAR | Status: DC | PRN
  Administered 2022-04-28: 20 mL

## 2022-04-28 MED ORDER — KETAMINE HCL 50 MG/5ML IJ SOSY
PREFILLED_SYRINGE | INTRAMUSCULAR | Status: AC
  Filled 2022-04-28: qty 5

## 2022-04-28 MED ORDER — CHLORHEXIDINE GLUCONATE 0.12 % MT SOLN: 15.0000 mL | Freq: Once | OROMUCOSAL | Status: AC

## 2022-04-28 MED ORDER — LIDOCAINE HCL (CARDIAC) PF 100 MG/5ML IV SOSY
PREFILLED_SYRINGE | INTRAVENOUS | Status: DC | PRN
  Administered 2022-04-28: 80 mg via INTRAVENOUS

## 2022-04-28 MED ORDER — DEXAMETHASONE SODIUM PHOSPHATE 10 MG/ML IJ SOLN
INTRAMUSCULAR | Status: DC | PRN
  Administered 2022-04-28: 10 mg via INTRAVENOUS

## 2022-04-28 MED ORDER — LACTATED RINGERS IV SOLN: INTRAVENOUS | Status: DC

## 2022-04-28 MED ORDER — CEFAZOLIN SODIUM-DEXTROSE 2-4 GM/100ML-% IV SOLN
INTRAVENOUS | Status: AC
  Filled 2022-04-28: qty 100

## 2022-04-28 MED ORDER — FAMOTIDINE 20 MG PO TABS: 20.0000 mg | ORAL_TABLET | Freq: Once | ORAL | Status: AC

## 2022-04-28 MED ORDER — CEFAZOLIN SODIUM-DEXTROSE 2-4 GM/100ML-% IV SOLN
2.0000 g | INTRAVENOUS | Status: AC
  Administered 2022-04-28: 2 g via INTRAVENOUS

## 2022-04-28 MED ORDER — PROPOFOL 10 MG/ML IV BOLUS
INTRAVENOUS | Status: AC
  Filled 2022-04-28: qty 40

## 2022-04-28 MED ORDER — FENTANYL CITRATE (PF) 100 MCG/2ML IJ SOLN
INTRAMUSCULAR | Status: DC | PRN
  Administered 2022-04-28 (×2): 25 ug via INTRAVENOUS

## 2022-04-28 MED ORDER — ORAL CARE MOUTH RINSE: 15.0000 mL | Freq: Once | OROMUCOSAL | Status: AC

## 2022-04-28 MED ORDER — MIDAZOLAM HCL 2 MG/2ML IJ SOLN
INTRAMUSCULAR | Status: AC
  Filled 2022-04-28: qty 2

## 2022-04-28 SURGICAL SUPPLY — 25 items
BAG DRAIN SIEMENS DORNER NS (MISCELLANEOUS) ×2 IMPLANT
BAG DRN NS LF (MISCELLANEOUS) ×2
BRUSH SCRUB EZ  4% CHG (MISCELLANEOUS) ×2
BRUSH SCRUB EZ 1% IODOPHOR (MISCELLANEOUS) ×2 IMPLANT
BRUSH SCRUB EZ 4% CHG (MISCELLANEOUS) ×2 IMPLANT
CATH URETL OPEN 5X70 (CATHETERS) ×2 IMPLANT
DRAPE UTILITY 15X26 TOWEL STRL (DRAPES) ×2 IMPLANT
DRSG TELFA 3X4 N-ADH STERILE (GAUZE/BANDAGES/DRESSINGS) ×2 IMPLANT
ELECT REM PT RETURN 9FT ADLT (ELECTROSURGICAL) ×2
ELECTRODE REM PT RTRN 9FT ADLT (ELECTROSURGICAL) ×2 IMPLANT
GAUZE 4X4 16PLY ~~LOC~~+RFID DBL (SPONGE) ×4 IMPLANT
GLOVE BIO SURGEON STRL SZ 6.5 (GLOVE) ×2 IMPLANT
GOWN STRL REUS W/ TWL LRG LVL3 (GOWN DISPOSABLE) ×4 IMPLANT
GOWN STRL REUS W/TWL LRG LVL3 (GOWN DISPOSABLE) ×4
GUIDEWIRE STR DUAL SENSOR (WIRE) ×2 IMPLANT
IV NS IRRIG 3000ML ARTHROMATIC (IV SOLUTION) ×2 IMPLANT
KIT TURNOVER CYSTO (KITS) ×2 IMPLANT
NDL SAFETY ECLIP 18X1.5 (MISCELLANEOUS) ×2 IMPLANT
PACK CYSTO AR (MISCELLANEOUS) ×2 IMPLANT
SET CYSTO W/LG BORE CLAMP LF (SET/KITS/TRAYS/PACK) ×2 IMPLANT
SURGILUBE 2OZ TUBE FLIPTOP (MISCELLANEOUS) ×2 IMPLANT
TRAP FLUID SMOKE EVACUATOR (MISCELLANEOUS) ×2 IMPLANT
WATER STERILE IRR 1000ML POUR (IV SOLUTION) ×2 IMPLANT
WATER STERILE IRR 3000ML UROMA (IV SOLUTION) ×2 IMPLANT
WATER STERILE IRR 500ML POUR (IV SOLUTION) ×2 IMPLANT

## 2022-04-28 NOTE — Interval H&P Note (Signed)
Patient seen and examined  Regular rate and rhythm Clear to auscultation bilaterally  All questions were answered

## 2022-04-28 NOTE — Op Note (Signed)
Date of procedure: 04/28/22  Preoperative diagnosis:  Bladder erythema Microscopic hematuria  Postoperative diagnosis:  As above  Procedure: Cystoscopy Bilateral retrograde pyelogram Random bladder biopsy  Surgeon: [name], MD  Anesthesia: General  Complications: None  Intraoperative findings: Inflammatory changes bladder neck and trigone.  Slight hyperemia on the posterior bladder wall but no distinct erythematous previously appreciated in the office.  No bladder lesions or tumors.  Normal retrograde pyelogram.  EBL: Minimal  Specimens: Bladder biopsy  Drains: None  Indication: Deborah Mcconnell is a 71 y.o. patient with microscopic hematuria found to have some suspicious erythematous patches on cystoscopy in the office.  After reviewing the management options for treatment, she elected to proceed with the above surgical procedure(s). We have discussed the potential benefits and risks of the procedure, side effects of the proposed treatment, the likelihood of the patient achieving the goals of the procedure, and any potential problems that might occur during the procedure or recuperation. Informed consent has been obtained.  Description of procedure:  The patient was taken to the operating room and general anesthesia was induced.  The patient was placed in the dorsal lithotomy position, prepped and draped in the usual sterile fashion, and preoperative antibiotics were administered. A preoperative time-out was performed.   A 21 French scope was advanced per urethra into the bladder.  The bladder was carefully inspected.  There are some inflammatory trigonitis type changes at the bladder neck as well as on the trigone and some slight hypervascularity in the posterior bladder wall which was indistinct.  There is no discrete erythema appreciated at this time and no papillary lesions tumors or masses.  Attention was first turned to the left UO.  Was cannulated using a 5  [place] open-ended ureteral catheter.  A gentle retrograde pyelogram on the side showed no hydroureteronephrosis or filling defects.  The same exact procedure was performed on the right side.  This was also unremarkable without hydronephrosis or filling defects.  Last, cold cup biopsy forcep were used to perform random bladder biopsies.  One was performed at the bladder neck/trigone, one of the posterior bladder wall at the area of hypervascularity, atrial lateral walls and the dome of the bladder.  These biopsies were relatively superficial.  I used Bugbee electrocautery to fulgurate the base of these lesions until no active bleeding was noted.  The bladder was then drained and the scope was removed.  The patient was cleaned and dried, repositioned in the supine position, reversed from anesthesia, taken to the PACU in stable condition.  Plan: I will call her with the pathology results.  Follow-up plan to be determined based on these, anticipate they will likely be benign.  [name], M.D.

## 2022-04-28 NOTE — Discharge Instructions (Addendum)

## 2022-04-28 NOTE — Transfer of Care (Signed)
Immediate Anesthesia Transfer of Care Note  Patient: Deborah Mcconnell  Procedure(s) Performed: CYSTOSCOPY WITH BLADDER BIOPSY (Bladder) CYSTOSCOPY WITH RETROGRADE PYELOGRAM (Bilateral: Bladder)  Patient Location: PACU  Anesthesia Type:General  Level of Consciousness: drowsy  Airway & Oxygen Therapy: Patient Spontanous Breathing  Post-op Assessment: Report given to RN  Post vital signs: Reviewed  Last Vitals:  Vitals Value Taken Time  BP    Temp    Pulse 58 04/28/22 0820  Resp 9 04/28/22 0820  SpO2 98 % 04/28/22 0820  Vitals shown include unvalidated device data.  Last Pain:  Vitals:   04/28/22 0641  TempSrc: Temporal  PainSc: 0-No pain      Patients Stated Pain Goal: 0 (01/75/10 2585)  Complications: No notable events documented.

## 2022-04-28 NOTE — Anesthesia Preprocedure Evaluation (Signed)
Anesthesia Evaluation  Patient identified by MRN, date of birth, ID band Patient awake    Reviewed: Allergy & Precautions, H&P , NPO status , Patient's Chart, lab work & pertinent test results, reviewed documented beta blocker date and time   History of Anesthesia Complications (+) PONV and history of anesthetic complications  Airway Mallampati: II  TM Distance: >3 FB Neck ROM: full    Dental  (+) Teeth Intact   Pulmonary neg pulmonary ROS,    Pulmonary exam normal        Cardiovascular Exercise Tolerance: Good negative cardio ROS Normal cardiovascular exam Rate:Normal     Neuro/Psych negative neurological ROS  negative psych ROS   GI/Hepatic negative GI ROS, Neg liver ROS,   Endo/Other  negative endocrine ROS  Renal/GU negative Renal ROS  negative genitourinary   Musculoskeletal   Abdominal   Peds  Hematology negative hematology ROS (+)   Anesthesia Other Findings   Reproductive/Obstetrics negative OB ROS                             Anesthesia Physical Anesthesia Plan  ASA: 2  Anesthesia Plan: General LMA   Post-op Pain Management:    Induction:   PONV Risk Score and Plan: 4 or greater  Airway Management Planned:   Additional Equipment:   Intra-op Plan:   Post-operative Plan:   Informed Consent: I have reviewed the patients History and Physical, chart, labs and discussed the procedure including the risks, benefits and alternatives for the proposed anesthesia with the patient or authorized representative who has indicated his/her understanding and acceptance.       Plan Discussed with: CRNA  Anesthesia Plan Comments:         Anesthesia Quick Evaluation

## 2022-04-28 NOTE — Anesthesia Procedure Notes (Signed)
Procedure Name: LMA Insertion Date/Time: 04/28/2022 7:50 AM  Performed by: Fredderick Phenix, CRNAPre-anesthesia Checklist: Patient identified, Suction available, Patient being monitored, Emergency Drugs available and Timeout performed Patient Re-evaluated:Patient Re-evaluated prior to induction Oxygen Delivery Method: Circle system utilized Preoxygenation: Pre-oxygenation with 100% oxygen Induction Type: IV induction LMA: LMA inserted LMA Size: 4.0

## 2022-04-29 ENCOUNTER — Encounter: Payer: Self-pay | Admitting: Urology

## 2022-04-29 LAB — SURGICAL PATHOLOGY

## 2022-04-29 NOTE — Anesthesia Postprocedure Evaluation (Signed)
Anesthesia Post Note  Patient: Deborah Mcconnell  Procedure(s) Performed: CYSTOSCOPY WITH BLADDER BIOPSY (Bladder) CYSTOSCOPY WITH RETROGRADE PYELOGRAM (Bilateral: Bladder)  Patient location during evaluation: PACU Anesthesia Type: General Level of consciousness: awake and alert Pain management: pain level controlled Vital Signs Assessment: post-procedure vital signs reviewed and stable Respiratory status: spontaneous breathing, nonlabored ventilation, respiratory function stable and patient connected to nasal cannula oxygen Cardiovascular status: blood pressure returned to baseline and stable Postop Assessment: no apparent nausea or vomiting Anesthetic complications: no   No notable events documented.   Last Vitals:  Vitals:   04/28/22 0830 04/28/22 0849  BP: 114/72 135/72  Pulse: 66 63  Resp: 14 16  Temp:  36.6 C  SpO2: 100% 99%    Last Pain:  Vitals:   04/29/22 0930  TempSrc:   PainSc: 0-No pain                 Molli Barrows

## 2022-05-01 ENCOUNTER — Telehealth: Payer: Self-pay

## 2022-05-01 NOTE — Telephone Encounter (Signed)
-----   Message from [name], MD sent at 05/01/2022 12:39 PM EDT ----- Surgical pathology is benign, great news.  This area probably looked a little bit red/inflamed because of increased blood vessels just below the surface but this is not indicative of cancer or anything pathologic.  Lets have a follow-up in a year with a UA or sooner as needed if you develop urinary symptoms.  [name], MD

## 2022-05-01 NOTE — Telephone Encounter (Signed)
Patient aware of results, follow up scheduled.

## 2022-07-07 ENCOUNTER — Other Ambulatory Visit: Payer: Self-pay | Admitting: Family

## 2022-08-08 DIAGNOSIS — D2271 Melanocytic nevi of right lower limb, including hip: Secondary | ICD-10-CM | POA: Diagnosis not present

## 2022-08-08 DIAGNOSIS — L821 Other seborrheic keratosis: Secondary | ICD-10-CM | POA: Diagnosis not present

## 2022-08-08 DIAGNOSIS — D2272 Melanocytic nevi of left lower limb, including hip: Secondary | ICD-10-CM | POA: Diagnosis not present

## 2022-08-08 DIAGNOSIS — D225 Melanocytic nevi of trunk: Secondary | ICD-10-CM | POA: Diagnosis not present

## 2022-08-08 DIAGNOSIS — D2261 Melanocytic nevi of right upper limb, including shoulder: Secondary | ICD-10-CM | POA: Diagnosis not present

## 2022-08-08 DIAGNOSIS — D2262 Melanocytic nevi of left upper limb, including shoulder: Secondary | ICD-10-CM | POA: Diagnosis not present

## 2022-08-22 ENCOUNTER — Telehealth: Payer: Self-pay | Admitting: Family

## 2022-08-22 NOTE — Telephone Encounter (Signed)
Called patient to schedule Medicare Annual Wellness Visit (AWV). Left message for patient to call back and schedule Medicare Annual Wellness Visit (AWV).  Last date of AWV: 08/27/2021   Please schedule an appointment at any time with Denisa,NHA.  If any questions, please contact me at [phone].    Thank you,  [place] Direct dial  [phone]

## 2022-09-02 ENCOUNTER — Encounter: Payer: Self-pay | Admitting: *Deleted

## 2022-09-02 ENCOUNTER — Ambulatory Visit (INDEPENDENT_AMBULATORY_CARE_PROVIDER_SITE_OTHER): Payer: Medicare Other | Admitting: *Deleted

## 2022-09-02 VITALS — BP 128/73 | Ht 71.0 in | Wt 189.0 lb

## 2022-09-02 DIAGNOSIS — Z0000 Encounter for general adult medical examination without abnormal findings: Secondary | ICD-10-CM | POA: Diagnosis not present

## 2022-09-02 NOTE — Progress Notes (Signed)
I connected with  Deborah Mcconnell on 09/02/22 by a audio enabled telemedicine application and verified that I am speaking with the correct person using two identifiers.  Patient Location: Home  Provider Location: Office/Clinic  I discussed the limitations of evaluation and management by telemedicine. The patient expressed understanding and agreed to proceed.   Subjective:   Deborah Mcconnell is a 72 y.o. female who presents for Medicare Annual (Subsequent) preventive examination.       Objective:    Today's Vitals   09/02/22 1551  BP: 128/73  Weight: 189 lb (85.7 kg)  Height: '5\' 11"'$  (1.803 m)   Body mass index is 26.36 kg/m.     09/02/2022    4:06 PM 04/28/2022    6:32 AM 04/18/2022    2:23 PM 08/27/2021    9:14 AM 08/22/2020    1:40 PM 08/22/2019   11:09 AM 08/19/2018    8:19 AM  Advanced Directives  Does Patient Have a Medical Advance Directive? Yes Yes Yes Yes Yes Yes Yes  Type of Paramedic of [place];Living will [place];Living will  [place];Living will [place];Living will Living will;Healthcare Power of [place];Living will  Does patient want to make changes to medical advance directive? No - Patient declined No - Patient declined  No - Patient declined No - Patient declined No - Patient declined No - Patient declined  Copy of [place] in Chart? No - copy requested No - copy requested  No - copy requested No - copy requested No - copy requested No - copy requested  Would patient like information on creating a medical advance directive?  No - Patient declined No - Patient declined        Current Medications (verified) Outpatient Encounter Medications as of 09/02/2022  Medication Sig   Ascorbic Acid (VITAMIN C) 1000 MG tablet Take 1,000 mg by mouth daily.   aspirin EC 81 MG tablet Take 81 mg by mouth daily.   B Complex Vitamins  (VITAMIN B COMPLEX PO) Take by mouth.   Bacillus Coagulans-Inulin (PROBIOTIC) 1-250 BILLION-MG CAPS Take by mouth.   calcium carbonate (OS-CAL) 600 MG TABS tablet Take by mouth. Take 1,200 mg by mouth.   cephALEXin (KEFLEX) 250 MG capsule Take one after intercourse   CRANBERRY PO Take by mouth.   diclofenac sodium (VOLTAREN) 1 % GEL Apply 4 g topically 4 (four) times daily.   ibuprofen (ADVIL,MOTRIN) 200 MG tablet Take 600 mg by mouth 2 (two) times daily. prn   simvastatin (ZOCOR) 40 MG tablet TAKE 1 TABLET BY MOUTH AT BEDTIME   chlorhexidine (HIBICLENS) 4 % external liquid Apply topically daily as needed. (Patient not taking: Reported on 09/02/2022)   meloxicam (MOBIC) 7.5 MG tablet Take 1 tablet (7.5 mg total) by mouth daily as needed for pain. (Patient not taking: Reported on 04/18/2022)   No facility-administered encounter medications on file as of 09/02/2022.    Allergies (verified) Ciprofloxacin, Ofloxacin, Quinolones, Actonel [risedronate], Augmentin [amoxicillin-pot clavulanate], Other, and Bactrim [sulfamethoxazole-trimethoprim]   History: Past Medical History:  Diagnosis Date   Arthritis    osteoarthritis   Breast cancer, left ([place]) 2014   Hx Lumpectomy and Rad tx's.   Heart palpitations    Hyperlipidemia    Osteopenia    previously taking Evista and Actonel   Osteoporosis    PONV (postoperative nausea and vomiting)    Vertigo    Past Surgical  History:  Procedure Laterality Date   CHOLECYSTECTOMY     CYSTOSCOPY W/ RETROGRADES Bilateral 04/28/2022   Procedure: CYSTOSCOPY WITH RETROGRADE PYELOGRAM;  Surgeon: [name], MD;  Location: [facility] ORS;  Service: Urology;  Laterality: Bilateral;   CYSTOSCOPY WITH BIOPSY N/A 04/28/2022   Procedure: CYSTOSCOPY WITH BLADDER BIOPSY;  Surgeon: [name], MD;  Location: [facility] ORS;  Service: Urology;  Laterality: N/A;   lt breast lumpectomy  2014   Family History  Problem Relation Age of Onset   Osteoporosis Mother     Hypertension Mother    Osteoarthritis Mother    Osteoporosis Sister    Osteoarthritis Sister    Osteoarthritis Maternal Grandmother    Diabetes Maternal Grandfather    Cancer Paternal Grandfather        unknown   Prostate cancer Neg Hx    Bladder Cancer Neg Hx    Kidney cancer Neg Hx    Social History   Socioeconomic History   Marital status: Married    Spouse name: Not on file   Number of children: Not on file   Years of education: Not on file   Highest education level: Not on file  Occupational History   Not on file  Tobacco Use   Smoking status: Never   Smokeless tobacco: Never  Vaping Use   Vaping Use: Never used  Substance and Sexual Activity   Alcohol use: Yes    Alcohol/week: 0.0 standard drinks of alcohol    Comment: rare   Drug use: No   Sexual activity: Yes  Other Topics Concern   Not on file  Social History Narrative   'lindy'      Works at [employer] in [place].       Married.       Exercise- not excercising.          Social Determinants of Health   Financial Resource Strain: Low Risk  (09/01/2022)   Overall Financial Resource Strain (CARDIA)    Difficulty of Paying Living Expenses: Not hard at all  Food Insecurity: No Food Insecurity (09/01/2022)   Hunger Vital Sign    Worried About Running Out of Food in the Last Year: Never true    Ran Out of Food in the Last Year: Never true  Transportation Needs: No Transportation Needs (09/01/2022)   PRAPARE - Hydrologist (Medical): No    Lack of Transportation (Non-Medical): No  Physical Activity: Insufficiently Active (09/02/2022)   Exercise Vital Sign    Days of Exercise per Week: 3 days    Minutes of Exercise per Session: 30 min  Stress: No Stress Concern Present (09/01/2022)   [place]    Feeling of Stress : Not at all  Social Connections: [place] (09/02/2022)   Social Connection and  Isolation Panel [NHANES]    Frequency of Communication with Friends and Family: More than three times a week    Frequency of Social Gatherings with Friends and Family: More than three times a week    Attends Religious Services: More than 4 times per year    Active Member of [employer] or Organizations: Yes    Attends Music therapist: More than 4 times per year    Marital Status: Married    Tobacco Counseling Counseling given: Not Answered   Clinical Intake:  Pre-visit preparation completed: Yes  Pain : No/denies pain     BMI - recorded: 26.37 Nutritional Status:  BMI 25 -29 Overweight Nutritional Risks: None Diabetes: No  How often do you need to have someone help you when you read instructions, pamphlets, or other written materials from your doctor or pharmacy?: 1 - Never  Diabetic? No  Interpreter Needed?: No  Information entered by :: Jari Favre, CMA   Activities of Daily Living    09/01/2022    3:17 PM 04/18/2022    2:27 PM  In your present state of health, do you have any difficulty performing the following activities:  Hearing? 0   Vision? 0   Difficulty concentrating or making decisions? 0   Walking or climbing stairs? 0   Dressing or bathing? 0   Doing errands, shopping? 0 0  Preparing Food and eating ? N   Using the Toilet? N   In the past six months, have you accidently leaked urine? N   Do you have problems with loss of bowel control? N   Managing your Medications? N   Managing your Finances? N   Housekeeping or managing your Housekeeping? N     Patient Care Team: Burnard Hawthorne, FNP as PCP - General (Family Medicine)  Indicate any recent Medical Services you may have received from other than [facility] providers in the past year (date may be approximate).     Assessment:   This is a routine wellness examination for [place].  Hearing/Vision screen No results found.  Dietary issues and exercise activities discussed:     Goals Addressed              This Visit's Progress     Acknowledge receipt of Advanced Directive package       Bring in [place] so that we can scan into chart     Increase physical activity   On track    Add weight bearing exercises       Depression Screen    10/16/2021    1:38 PM 08/27/2021    9:15 AM 04/17/2021    3:02 PM 01/21/2021    1:26 PM 11/05/2020    2:20 PM 08/22/2020    1:39 PM 08/22/2019   11:13 AM  PHQ 2/9 Scores  PHQ - 2 Score 0 0 0 0 0 0 0  PHQ- 9 Score 0          Fall Risk    09/02/2022    4:04 PM 09/01/2022    3:17 PM 10/16/2021    1:38 PM 08/27/2021    9:15 AM 04/17/2021    3:01 PM  Fall Risk   Falls in the past year? 0 0 0 0 0  Number falls in past yr: 0 0 0 0 0  Injury with Fall? 0 0 0  0  Risk for fall due to : No Fall Risks  No Fall Risks    Follow up   Falls evaluation completed Falls evaluation completed Falls evaluation completed    [place]:  Any stairs in or around the home? Yes  If so, are there any without handrails? No  Home free of loose throw rugs in walkways, pet beds, electrical cords, etc? Yes  Adequate lighting in your home to reduce risk of falls? Yes   ASSISTIVE DEVICES UTILIZED TO PREVENT FALLS:  Life alert? No  Use of a cane, walker or w/c? No  Grab bars in the bathroom? No  Shower chair or bench in shower? Yes  Elevated toilet seat or a handicapped toilet?  Yes    Cognitive Function:    08/19/2018    8:24 AM 08/18/2017    8:40 AM 08/15/2016    8:49 AM  MMSE - Mini Mental State Exam  Orientation to time '5 5 5  '$ Orientation to Place '5 5 5  '$ Registration '3 3 3  '$ Attention/ Calculation '5 5 5  '$ Recall '3 3 3  '$ Language- name 2 objects '2 2 2  '$ Language- repeat '1 1 1  '$ Language- follow 3 step command '3 3 3  '$ Language- read & follow direction '1 1 1  '$ Write a sentence '1 1 1  '$ Copy design '1 1 1  '$ Total score '30 30 30        '$ 09/02/2022    4:04 PM 08/27/2021    9:26 AM 08/22/2019   11:23 AM 08/15/2016     8:25 AM  6CIT Screen  What Year? 0 points 0 points 0 points 0 points  What month? 0 points 0 points 0 points 0 points  What time? 0 points 0 points 0 points 0 points  Count back from 20 0 points 0 points 0 points 0 points  Months in reverse 0 points 0 points 0 points 0 points  Repeat phrase 0 points 0 points 0 points 0 points  Total Score 0 points 0 points 0 points 0 points    Immunizations Immunization History  Administered Date(s) Administered   Fluad Quad(high Dose 65+) 04/26/2019, 03/12/2020, 04/17/2021, 04/14/2022   Influenza Whole 04/13/2013   Influenza, High Dose Seasonal PF 02/27/2016, 04/17/2017, 03/31/2018   Influenza,inj,Quad PF,6+ Mos 06/13/2015   Influenza-Unspecified 04/03/2014, 06/13/2015   Moderna Sars-Covid-2 Vaccination 08/07/2019, 09/07/2019, 09/03/2020   PPD Test 03/14/2020   Pneumococcal Conjugate-13 09/10/2015   Pneumococcal Polysaccharide-23 04/28/2012, 08/18/2017   Tdap 10/05/2014   Zoster, Live 05/08/2014    TDAP status: Up to date  Flu Vaccine status: Up to date  Pneumococcal vaccine status: Up to date  Covid-19 vaccine status: Information provided on how to obtain vaccines.   Qualifies for Shingles Vaccine? Yes   Zostavax completed No   Shingrix Completed?: No.    Education has been provided regarding the importance of this vaccine. Patient has been advised to call insurance company to determine out of pocket expense if they have not yet received this vaccine. Advised may also receive vaccine at local pharmacy or Health Dept. Verbalized acceptance and understanding.  Screening Tests Health Maintenance  Topic Date Due   Zoster Vaccines- Shingrix (1 of 2) Never done   COVID-19 Vaccine (4 - 2023-24 season) 02/28/2022   PAP SMEAR-Modifier  10/17/2022   MAMMOGRAM  04/05/2023   Medicare Annual Wellness (AWV)  09/02/2023   COLONOSCOPY (Pts 45-16yr Insurance coverage will need to be confirmed)  07/24/2024   DTaP/Tdap/Td (2 - Td or Tdap) 10/04/2024    Pneumonia Vaccine 72 Years old  Completed   INFLUENZA VACCINE  Completed   DEXA SCAN  Completed   Hepatitis C Screening  Completed   HPV VACCINES  Aged Out    Health Maintenance  Health Maintenance Due  Topic Date Due   Zoster Vaccines- Shingrix (1 of 2) Never done   COVID-19 Vaccine (4 - 2023-24 season) 02/28/2022   PAP SMEAR-Modifier  10/17/2022    Colorectal cancer screening: Type of screening: Colonoscopy. Completed 07/24/2014. Repeat every 10 years  Mammogram status: Completed 04/04/2021. Repeat every year  Bone Density status: Completed 09/04/2011. Results reflect: Bone density results: OSTEOPENIA. Repeat every 2 years.  Lung Cancer Screening: (Low Dose CT Chest  recommended if Age 77-80 years, 41 pack-year currently smoking OR have quit w/in 15years.) does not qualify.     Additional Screening:  Hepatitis C Screening: does qualify; Completed 04/02/2016  Vision Screening: Recommended annual ophthalmology exams for early detection of glaucoma and other disorders of the eye. Is the patient up to date with their annual eye exam?  Yes  Who is the provider or what is the name of the office in which the patient attends annual eye exams? [facility] If pt is not established with a provider, would they like to be referred to a provider to establish care? No .   Dental Screening: Recommended annual dental exams for proper oral hygiene  Community Resource Referral / Chronic Care Management: CRR required this visit?  No   CCM required this visit?  No      Plan:     I have personally reviewed and noted the following in the patient's chart:   Medical and social history Use of alcohol, tobacco or illicit drugs  Current medications and supplements including opioid prescriptions. Patient is not currently taking opioid prescriptions. Functional ability and status Nutritional status Physical activity Advanced directives List of other physicians Hospitalizations, surgeries,  and ER visits in previous 12 months Vitals Screenings to include cognitive, depression, and falls Referrals and appointments  In addition, I have reviewed and discussed with patient certain preventive protocols, quality metrics, and best practice recommendations. A written personalized care plan for preventive services as well as general preventive health recommendations were provided to patient.     Cannon Kettle, [place]   09/02/2022   Nurse Notes: Total time spent on telephone visit today was 13 minutes

## 2022-09-02 NOTE — Patient Instructions (Signed)
  Deborah Mcconnell , Thank you for taking time to come for your Medicare Wellness Visit. I appreciate your ongoing commitment to your health goals. Please review the following plan we discussed and let me know if I can assist you in the future.   These are the goals we discussed:  Goals      Follow up with Primary Care Provider     As needed     Increase physical activity     Add weight bearing exercises        This is a list of the screening recommended for you and due dates:  Health Maintenance  Topic Date Due   Zoster (Shingles) Vaccine (1 of 2) Never done   COVID-19 Vaccine (4 - 2023-24 season) 02/28/2022   Pap Smear  10/17/2022   Mammogram  04/05/2023   Medicare Annual Wellness Visit  09/02/2023   Colon Cancer Screening  07/24/2024   DTaP/Tdap/Td vaccine (2 - Td or Tdap) 10/04/2024   Pneumonia Vaccine  Completed   Flu Shot  Completed   DEXA scan (bone density measurement)  Completed   Hepatitis C Screening: USPSTF Recommendation to screen - Ages 67-79 yo.  Completed   HPV Vaccine  Aged Out

## 2022-10-04 ENCOUNTER — Other Ambulatory Visit: Payer: Self-pay | Admitting: Family

## 2022-10-17 ENCOUNTER — Encounter: Payer: Self-pay | Admitting: Family

## 2022-10-17 ENCOUNTER — Ambulatory Visit (INDEPENDENT_AMBULATORY_CARE_PROVIDER_SITE_OTHER): Payer: Medicare Other | Admitting: Family

## 2022-10-17 VITALS — BP 120/78 | HR 78 | Temp 97.7°F | Ht 71.0 in | Wt 190.4 lb

## 2022-10-17 DIAGNOSIS — K7689 Other specified diseases of liver: Secondary | ICD-10-CM

## 2022-10-17 DIAGNOSIS — M8580 Other specified disorders of bone density and structure, unspecified site: Secondary | ICD-10-CM

## 2022-10-17 DIAGNOSIS — Z0000 Encounter for general adult medical examination without abnormal findings: Secondary | ICD-10-CM

## 2022-10-17 DIAGNOSIS — I700 Atherosclerosis of aorta: Secondary | ICD-10-CM | POA: Diagnosis not present

## 2022-10-17 DIAGNOSIS — Z780 Asymptomatic menopausal state: Secondary | ICD-10-CM | POA: Diagnosis not present

## 2022-10-17 DIAGNOSIS — Z8639 Personal history of other endocrine, nutritional and metabolic disease: Secondary | ICD-10-CM

## 2022-10-17 DIAGNOSIS — E559 Vitamin D deficiency, unspecified: Secondary | ICD-10-CM | POA: Diagnosis not present

## 2022-10-17 NOTE — Assessment & Plan Note (Signed)
Patient is currently on simvastatin 40 mg.  LDL has been historically well-controlled.  Pending lipid panel at this time.  We agreed CT calcium score to stratify risk of ASCVD.  We discussed changing from simvastatin to Crestor or Lipitor.  Will defer to after results of CT calcium score . patient will call to schedule.

## 2022-10-17 NOTE — Progress Notes (Signed)
Assessment & Plan:  Routine physical examination Assessment & Plan:  She has no longer screening for cervical cancer. 09/2022.  Pap smear 10/16/2021 shows benign reparative changes, NIL. Prior pap 10/08/2015 negative malignancy, HPV  Deferred pelvic exam in the absence of complaints.  Clinical breast exam performed today.  Encouraged continued exercise   Liver cyst Assessment & Plan: CT hematuria workup 02/28/2022 showing small cyst in the dome of the liver.  Similar mild central intra and extrahepatic biliary ductal dilatation   PET scan 04/05/2013 0.7 cm low-attenuation lesion at  the hepatic dome does not have corresponding hypermetabolic activity   Pending right upper quadrant ultrasound for dedicated evaluation.  Orders: -     US ABDOMEN LIMITED RUQ (LIVER/GB); Future  Atherosclerosis of aorta Assessment & Plan: Patient is currently on simvastatin 40 mg.  LDL has been historically well-controlled.  Pending lipid panel at this time.  We agreed CT calcium score to stratify risk of ASCVD.  We discussed changing from simvastatin to Crestor or Lipitor.  Will defer to after results of CT calcium score . patient will call to schedule.  Orders: -     CT CARDIAC SCORING (SELF PAY ONLY); Future -     Lipid panel; Future  Osteopenia, unspecified location -     VITAMIN D 25 Hydroxy (Vit-D Deficiency, Fractures); Future  Asymptomatic postmenopausal state -     VITAMIN D 25 Hydroxy (Vit-D Deficiency, Fractures); Future  History of vitamin D deficiency -     VITAMIN D 25 Hydroxy (Vit-D Deficiency, Fractures); Future  Vitamin D deficiency, unspecified -     VITAMIN D 25 Hydroxy (Vit-D Deficiency, Fractures); Future     Return precautions given.   Risks, benefits, and alternatives of the medications and treatment plan prescribed today were discussed, and patient expressed understanding.   Education regarding symptom management and diagnosis given to patient on AVS either  electronically or printed.  Return for Complete Physical Exam, Fasting labs in 2-3 weeks.  Rennie Plowman, FNP  Subjective:    Patient ID: Deborah Mcconnell, female    DOB: [birth year], 72 y.o.   MRN: [id number]  CC: Deborah Mcconnell is a 72 y.o. female who presents today for physical exam and follow up.    HPI: Feels well today. No new complaints.  She has been following with urology, Dr. Apolinar Junes for hematuria workup.  No family history ASCVD.  No chest pain or shortness of breath   CT hematuria workup 02/28/2022 showing small cyst in the dome of the liver.  Similar mild central intra and extrahepatic biliary ductal dilatation   PET scan 04/05/2013 0.7 cm low-attenuation lesion at  the hepatic dome does not have corresponding hypermetabolic activity    Colorectal Cancer Screening: UTD , 07/24/2014, no specimens collected.  Repeat in 10 years. Breast Cancer Screening: Mammogram obtained 04/17/2022   At the surgical site in the left breast, there is density and  architectural distortion, stable compared to the prior films and  consistent with a post surgical scar. The lumpectomy site shows no  mammographic evidence of recurrent malignancy.   There are no suspicious masses, calcifications, or other findings in  either breast.   history of left breast cancer   Cervical Cancer Screening: She has no longer screening for cervical cancer.  Pap smear 10/16/2021 shows benign reparative changes, NIL. Prior pap 10/08/2015 negative malignancy, HPV   Bone Health screening/DEXA for 65+: History of osteopenia, last bone density obtained  03/26/2022   Lung  Cancer Screening: Doesn't have 20 year pack year history and age > 70 years yo 51 years       Tetanus - UTD        Pneumococcal - Complete Exercise: Gets regular exercise walking.  Alcohol use:  rare Smoking/tobacco use: Nonsmoker.    Health Maintenance  Topic Date Due   Zoster (Shingles) Vaccine (1 of 2) Never done   COVID-19  Vaccine (4 - 2023-24 season) 02/28/2022   Pap Smear  10/17/2022   Flu Shot  01/29/2023   Mammogram  04/05/2023   Medicare Annual Wellness Visit  09/02/2023   Colon Cancer Screening  07/24/2024   DTaP/Tdap/Td vaccine (2 - Td or Tdap) 10/04/2024   Pneumonia Vaccine  Completed   DEXA scan (bone density measurement)  Completed   Hepatitis C Screening: USPSTF Recommendation to screen - Ages 68-79 yo.  Completed   HPV Vaccine  Aged Out    ALLERGIES: Ciprofloxacin, Ofloxacin, Quinolones, Actonel [risedronate], Augmentin [amoxicillin-pot clavulanate], Other, and Bactrim [sulfamethoxazole-trimethoprim]  Current Outpatient Medications on File Prior to Visit  Medication Sig Dispense Refill   Ascorbic Acid (VITAMIN C) 1000 MG tablet Take 1,000 mg by mouth daily.     aspirin EC 81 MG tablet Take 81 mg by mouth daily.     B Complex Vitamins (VITAMIN B COMPLEX PO) Take by mouth.     Bacillus Coagulans-Inulin (PROBIOTIC) 1-250 BILLION-MG CAPS Take by mouth.     calcium carbonate (OS-CAL) 600 MG TABS tablet Take by mouth. Take 1,200 mg by mouth.     cephALEXin (KEFLEX) 250 MG capsule Take one after intercourse 90 capsule 1   chlorhexidine (HIBICLENS) 4 % external liquid Apply topically daily as needed. 120 mL 0   CRANBERRY PO Take by mouth.     diclofenac sodium (VOLTAREN) 1 % GEL Apply 4 g topically 4 (four) times daily. 1 Tube 3   ibuprofen (ADVIL,MOTRIN) 200 MG tablet Take 600 mg by mouth 2 (two) times daily. prn     simvastatin (ZOCOR) 40 MG tablet TAKE 1 TABLET BY MOUTH AT BEDTIME 90 tablet 0   No current facility-administered medications on file prior to visit.    Review of Systems    Objective:    BP 120/78   Pulse 78   Temp 97.7 F (36.5 C) (Oral)   Ht  (1.803 m)   Wt 190 lb 6.4 oz (86.4 kg)   SpO2 97%   BMI 26.56 kg/m   BP Readings from Last 3 Encounters:  10/17/22 120/78  09/02/22 128/73  04/28/22 135/72   Wt Readings from Last 3 Encounters:  10/17/22 190 lb 6.4 oz  (86.4 kg)  09/02/22 189 lb (85.7 kg)  04/28/22 186 lb 15.2 oz (84.8 kg)    Physical Exam

## 2022-10-17 NOTE — Assessment & Plan Note (Signed)
CT hematuria workup 02/28/2022 showing small cyst in the dome of the liver.  Similar mild central intra and extrahepatic biliary ductal dilatation   PET scan 04/05/2013 0.7 cm low-attenuation lesion at  the hepatic dome does not have corresponding hypermetabolic activity   Pending right upper quadrant ultrasound for dedicated evaluation.

## 2022-10-17 NOTE — Patient Instructions (Addendum)
I ordered upper quadrant liver ultrasound to further evaluate liver lesion and ensure that we are thorough.  Please ensure you call your insurance company ahead of scheduling .  My office to reach out to you to schedule.    I have ordered CT calcium score ( SELF PAY option)  to further stratify your overall cardiovascular risk .   An estimate of cost is $150-200 out-of-pocket as not covered by insurance. However please call your insurance company in advance to ensure no other costs so that you do not have any unexpected bills.     In [facility], It can be performed at any of these 3 locations (outpatient imaging center Administrator, sports) at [address]/ [employer], [facility] or [place] outpatient center)   I have placed your order to [facility] in [place] as  generally most convenient.   Phone Number to [facility] on [address] is is [phone] to get scheduled.   Please call to get scheduled and if any issues at all in doing so, please let me know.   Below an article from [facility] regarding the test.   [email or web address]~:[email or web address]%[email or web address]%[email or web address]%[email or web address],[email or web address]%[email or web address].   Exams We Offer: Cardiac CT Calcium Score  Knowing your score could save your life. A cardiac CT calcium score, also known as a coronary calcium scan, is a quick, convenient and noninvasive way of evaluating the amount of calcified (hard) plaque in your heart vessels. The level of calcium equates to the extent of plaque build-up in your arteries. Plaque in the arteries can cause heart attacks.  The radiologist reads the images and sends your doctor a report with a calcium score. Patients with higher scores have a greater risk for a heart attack, heart disease or stroke. Knowing your score can help your doctor decide on blood pressure and cholesterol goals that will minimize your risk as much as  possible.  The [employer] of Cardiology found that Coronary artery calcification (CAC) is an excellent cardiovascular disease risk marker and can help guide the decision to use cholesterol reducing medications such as statins. A negative calcium score may reduce the need for statins in otherwise eligible patients.  The exam takes less than 10 minutes, is painless and does not require any IV or oral contrast. At [facility] Imaging locations, the out-of-pocket fee without insurance is $75. At the time of scheduling, please let us know if you want to process the exam through your insurance or self-pay at the rate of $75. Patients who want to self-pay should not submit their insurance card when checking in for the appointment.   Who should get a Cardiac CT Calcium Score: Middle age adults at intermediate risk of heart disease Family history of heart disease Borderline high cholesterol, high blood pressure or diabetes Overweight or physical inactivity Uncertain about taking daily preventive medical therapy  Health Maintenance for Postmenopausal Women Menopause is a normal process in which your ability to get pregnant comes to an end. This process happens slowly over many months or years, usually between the ages of 47 and 36. Menopause is complete when you have missed your menstrual period for 12 months. It is important to talk with your health care provider about some of the most common conditions that affect women after menopause (postmenopausal women). These include heart disease, cancer, and bone loss (osteoporosis). Adopting a healthy lifestyle and getting preventive care can help to promote your health and wellness. The actions you take can also lower your chances of  developing some of these common conditions. What are the signs and symptoms of menopause? During menopause, you may have the following symptoms: Hot flashes. These can be moderate or severe. Night sweats. Decrease  in sex drive. Mood swings. Headaches. Tiredness (fatigue). Irritability. Memory problems. Problems falling asleep or staying asleep. Talk with your health care provider about treatment options for your symptoms. Do I need hormone replacement therapy? Hormone replacement therapy is effective in treating symptoms that are caused by menopause, such as hot flashes and night sweats. Hormone replacement carries certain risks, especially as you become older. If you are thinking about using estrogen or estrogen with progestin, discuss the benefits and risks with your health care provider. How can I reduce my risk for heart disease and stroke? The risk of heart disease, heart attack, and stroke increases as you age. One of the causes may be a change in the body's hormones during menopause. This can affect how your body uses dietary fats, triglycerides, and cholesterol. Heart attack and stroke are medical emergencies. There are many things that you can do to help prevent heart disease and stroke. Watch your blood pressure High blood pressure causes heart disease and increases the risk of stroke. This is more likely to develop in people who have high blood pressure readings or are overweight. Have your blood pressure checked: Every 3-5 years if you are 49-63 years of age. Every year if you are 22 years old or older. Eat a healthy diet  Eat a diet that includes plenty of vegetables, fruits, low-fat dairy products, and lean protein. Do not eat a lot of foods that are high in solid fats, added sugars, or sodium. Get regular exercise Get regular exercise. This is one of the most important things you can do for your health. Most adults should: Try to exercise for at least 150 minutes each week. The exercise should increase your heart rate and make you sweat (moderate-intensity exercise). Try to do strengthening exercises at least twice each week. Do these in addition to the moderate-intensity  exercise. Spend less time sitting. Even light physical activity can be beneficial. Other tips Work with your health care provider to achieve or maintain a healthy weight. Do not use any products that contain nicotine or tobacco. These products include cigarettes, chewing tobacco, and vaping devices, such as e-cigarettes. If you need help quitting, ask your health care provider. Know your numbers. Ask your health care provider to check your cholesterol and your blood sugar (glucose). Continue to have your blood tested as directed by your health care provider. Do I need screening for cancer? Depending on your health history and family history, you may need to have cancer screenings at different stages of your life. This may include screening for: Breast cancer. Cervical cancer. Lung cancer. Colorectal cancer. What is my risk for osteoporosis? After menopause, you may be at increased risk for osteoporosis. Osteoporosis is a condition in which bone destruction happens more quickly than new bone creation. To help prevent osteoporosis or the bone fractures that can happen because of osteoporosis, you may take the following actions: If you are 85-34 years old, get at least 1,000 mg of calcium and at least 600 international units (IU) of vitamin D per day. If you are older than age 16 but younger than age 33, get at least 1,200 mg of calcium and at least 600 international units (IU) of vitamin D per day. If you are older than age 21, get at least 1,200 mg of  calcium and at least 800 international units (IU) of vitamin D per day. Smoking and drinking excessive alcohol increase the risk of osteoporosis. Eat foods that are rich in calcium and vitamin D, and do weight-bearing exercises several times each week as directed by your health care provider. How does menopause affect my mental health? Depression may occur at any age, but it is more common as you become older. Common symptoms of depression  include: Feeling depressed. Changes in sleep patterns. Changes in appetite or eating patterns. Feeling an overall lack of motivation or enjoyment of activities that you previously enjoyed. Frequent crying spells. Talk with your health care provider if you think that you are experiencing any of these symptoms. General instructions See your health care provider for regular wellness exams and vaccines. This may include: Scheduling regular health, dental, and eye exams. Getting and maintaining your vaccines. These include: Influenza vaccine. Get this vaccine each year before the flu season begins. Pneumonia vaccine. Shingles vaccine. Tetanus, diphtheria, and pertussis (Tdap) booster vaccine. Your health care provider may also recommend other immunizations. Tell your health care provider if you have ever been abused or do not feel safe at home. Summary Menopause is a normal process in which your ability to get pregnant comes to an end. This condition causes hot flashes, night sweats, decreased interest in sex, mood swings, headaches, or lack of sleep. Treatment for this condition may include hormone replacement therapy. Take actions to keep yourself healthy, including exercising regularly, eating a healthy diet, watching your weight, and checking your blood pressure and blood sugar levels. Get screened for cancer and depression. Make sure that you are up to date with all your vaccines. This information is not intended to replace advice given to you by your health care provider. Make sure you discuss any questions you have with your health care provider. Document Revised: 11/05/2020 Document Reviewed: 11/05/2020 Elsevier Patient Education  2023 [employer].

## 2022-10-17 NOTE — Assessment & Plan Note (Addendum)
She has no longer screening for cervical cancer. 09/2022.  Pap smear 10/16/2021 shows benign reparative changes, NIL. Prior pap 10/08/2015 negative malignancy, HPV  Deferred pelvic exam in the absence of complaints.  Clinical breast exam performed today.  Encouraged continued exercise

## 2022-10-22 ENCOUNTER — Other Ambulatory Visit (INDEPENDENT_AMBULATORY_CARE_PROVIDER_SITE_OTHER): Payer: Medicare Other

## 2022-10-22 DIAGNOSIS — Z780 Asymptomatic menopausal state: Secondary | ICD-10-CM | POA: Diagnosis not present

## 2022-10-22 DIAGNOSIS — M8580 Other specified disorders of bone density and structure, unspecified site: Secondary | ICD-10-CM

## 2022-10-22 DIAGNOSIS — Z8639 Personal history of other endocrine, nutritional and metabolic disease: Secondary | ICD-10-CM

## 2022-10-22 DIAGNOSIS — E559 Vitamin D deficiency, unspecified: Secondary | ICD-10-CM

## 2022-10-22 DIAGNOSIS — I700 Atherosclerosis of aorta: Secondary | ICD-10-CM

## 2022-10-22 LAB — LIPID PANEL
Cholesterol: 156 mg/dL (ref 0–200)
HDL: 53.8 mg/dL (ref >39.00)
LDL Cholesterol: 83 mg/dL (ref 0–99)
NonHDL: 102.28
Total CHOL/HDL Ratio: 3
Triglycerides: 96 mg/dL (ref 0.0–149.0)
VLDL: 19.2 mg/dL (ref 0.0–40.0)

## 2022-10-22 LAB — VITAMIN D 25 HYDROXY (VIT D DEFICIENCY, FRACTURES): VITD: 32.44 ng/mL (ref 30.00–100.00)

## 2022-10-29 ENCOUNTER — Ambulatory Visit
Admission: RE | Admit: 2022-10-29 | Discharge: 2022-10-29 | Disposition: A | Discharge status: Home / Self Care | Payer: Medicare Other | Source: Ambulatory Visit | Attending: Family | Admitting: Family

## 2022-10-29 DIAGNOSIS — I700 Atherosclerosis of aorta: Secondary | ICD-10-CM | POA: Insufficient documentation

## 2022-11-06 ENCOUNTER — Encounter: Payer: Self-pay | Admitting: Family

## 2022-11-11 ENCOUNTER — Other Ambulatory Visit: Payer: Self-pay | Admitting: Family

## 2022-11-11 ENCOUNTER — Encounter: Payer: Self-pay | Admitting: Family

## 2022-11-11 DIAGNOSIS — I700 Atherosclerosis of aorta: Secondary | ICD-10-CM

## 2022-12-23 ENCOUNTER — Encounter: Payer: Self-pay | Admitting: Cardiovascular Disease

## 2022-12-23 ENCOUNTER — Ambulatory Visit: Payer: Medicare Other | Attending: Cardiology | Admitting: Cardiovascular Disease

## 2022-12-23 VITALS — BP 126/62 | HR 73 | Ht 71.5 in | Wt 185.0 lb

## 2022-12-23 DIAGNOSIS — I700 Atherosclerosis of aorta: Secondary | ICD-10-CM | POA: Insufficient documentation

## 2022-12-23 DIAGNOSIS — E785 Hyperlipidemia, unspecified: Secondary | ICD-10-CM | POA: Insufficient documentation

## 2022-12-23 NOTE — Progress Notes (Unsigned)
Cardiology Office Note   Date:  12/23/2022   ID:  Deborah, Mcconnell 1951-04-01, MRN [id number]  PCP:  Allegra Grana, FNP  Cardiologist:   Lorine Bears, MD   Chief Complaint  Patient presents with   New Patient (Initial Visit)    Referred by PCP for   Patient reports she has mild palpitations. Meds reviewed verbally with patient.       History of Present Illness: Deborah Mcconnell is a 72 y.o. female who was referred by Rennie Plowman for evaluation of aortic and coronary calcifications.  She was seen by Dr. Mariah Milling for palpitations with evidence of PACs and PVCs on previous monitor.  She has history of breast cancer on the left status post radiation therapy, hyperlipidemia and arthritis.  She is not diabetic and does not smoke.  She had previous imaging that showed mild aortic calcifications.  She had CT calcium score in May which showed a score of 27 which was at the 51 percentile for age and sex matched control.  She has no cardiac symptoms and denies chest pain or shortness of breath.  She stays active and walks on a regular basis for exercise.  No lower extremity claudication.  She has been on simvastatin for a while with no side effects. She continues to work as a Psychologist, prison and probation services.  Past Medical History:  Diagnosis Date   Arthritis    osteoarthritis   Breast cancer, left (HCC) 2014   Hx Lumpectomy and Rad tx's.   Heart palpitations    Hyperlipidemia    Osteopenia    previously taking Evista and Actonel   Osteoporosis    PONV (postoperative nausea and vomiting)    Vertigo     Past Surgical History:  Procedure Laterality Date   CHOLECYSTECTOMY     CYSTOSCOPY W/ RETROGRADES Bilateral 04/28/2022   Procedure: CYSTOSCOPY WITH RETROGRADE PYELOGRAM;  Surgeon: Vanna Scotland, MD;  Location: [facility] ORS;  Service: Urology;  Laterality: Bilateral;   CYSTOSCOPY WITH BIOPSY N/A 04/28/2022   Procedure: CYSTOSCOPY WITH BLADDER BIOPSY;  Surgeon: Vanna Scotland, MD;  Location: [facility] ORS;  Service: Urology;  Laterality: N/A;   lt breast lumpectomy  2014     Current Outpatient Medications  Medication Sig Dispense Refill   Ascorbic Acid (VITAMIN C) 1000 MG tablet Take 1,000 mg by mouth daily.     aspirin EC 81 MG tablet Take 81 mg by mouth daily.     B Complex Vitamins (VITAMIN B COMPLEX PO) Take by mouth.     Bacillus Coagulans-Inulin (PROBIOTIC) 1-250 BILLION-MG CAPS Take by mouth.     calcium carbonate (OS-CAL) 600 MG TABS tablet Take by mouth. Take 1,200 mg by mouth.     cephALEXin (KEFLEX) 250 MG capsule Take one after intercourse 90 capsule 1   chlorhexidine (HIBICLENS) 4 % external liquid Apply topically daily as needed. 120 mL 0   CRANBERRY PO Take by mouth.     diclofenac sodium (VOLTAREN) 1 % GEL Apply 4 g topically 4 (four) times daily. 1 Tube 3   ibuprofen (ADVIL,MOTRIN) 200 MG tablet Take 600 mg by mouth 2 (two) times daily. prn     simvastatin (ZOCOR) 40 MG tablet TAKE 1 TABLET BY MOUTH AT BEDTIME 90 tablet 0   No current facility-administered medications for this visit.    Allergies:   Ciprofloxacin, Ofloxacin, Quinolones, Actonel [risedronate], Augmentin [amoxicillin-pot clavulanate], Other, and Bactrim [sulfamethoxazole-trimethoprim]    Social History:  The patient  reports that she  has never smoked. She has never used smokeless tobacco. She reports current alcohol use. She reports that she does not use drugs.   Family History:  The patient's family history includes Cancer in her paternal grandfather; Diabetes in her maternal grandfather; Hypertension in her mother; Osteoarthritis in her maternal grandmother, mother, and sister; Osteoporosis in her mother and sister.    ROS:  Please see the history of present illness.   Otherwise, review of systems are positive for none.   All other systems are reviewed and negative.    PHYSICAL EXAM: VS:  BP 126/62 (BP Location: Left Arm, Patient Position: Sitting, Cuff Size: Normal)    Pulse 73   Ht 5' 11.5" (1.816 m)   Wt 185 lb (83.9 kg)   BMI 25.44 kg/m  , BMI Body mass index is 25.44 kg/m. GEN: Well nourished, well developed, in no acute distress  HEENT: normal  Neck: no JVD, carotid bruits, or masses Cardiac: RRR; no murmurs, rubs, or gallops,no edema  Respiratory:  clear to auscultation bilaterally, normal work of breathing GI: soft, nontender, nondistended, + BS MS: no deformity or atrophy  Skin: warm and dry, no rash Neuro:  Strength and sensation are intact Psych: euthymic mood, full affect Vascular: Dorsalis pedis and posterior tibial are both normal.  EKG:  EKG is ordered today. The ekg ordered today demonstrates normal sinus rhythm with incomplete right bundle branch block.   Recent Labs: 04/28/2022: BUN 18; Creatinine, Ser 0.66; Hemoglobin 14.6; Platelets 255; Potassium 3.9; Sodium 141    Lipid Panel    Component Value Date/Time   CHOL 156 10/22/2022 0734   TRIG 96.0 10/22/2022 0734   HDL 53.80 10/22/2022 0734   CHOLHDL 3 10/22/2022 0734   VLDL 19.2 10/22/2022 0734   LDLCALC 83 10/22/2022 0734      Wt Readings from Last 3 Encounters:  12/23/22 185 lb (83.9 kg)  10/17/22 190 lb 6.4 oz (86.4 kg)  09/02/22 189 lb (85.7 kg)           12/23/2022    3:07 PM 12/28/2017    5:20 PM  PAD Screen  Previous PAD dx? No No  Previous surgical procedure? No No  Pain with walking? No No  Feet/toe relief with dangling? No No  Painful, non-healing ulcers? No No  Extremities discolored? No No      ASSESSMENT AND PLAN:  1.  Coronary artery calcifications: Overall mild and below 100.  She currently has no anginal symptoms.  Risk factors for coronary artery disease include age, hyperlipidemia and previous radiation therapy. Given lack of anginal symptoms and being active, no indication for stress testing.  She follows an overall healthy lifestyle. I asked her to stop taking aspirin.  2.  Hyperlipidemia: I do think an LDL target below 100 is  acceptable in her situation considering that her calcium score was only slightly elevated.  Continue simvastatin.  Most recent LDL was 83.  3.  Aortic atherosclerosis: She has no claudication and her distal pulses are completely normal.    Disposition:   FU with me as needed.  Signed,  Lorine Bears, MD  12/23/2022 3:23 PM    [facility]

## 2022-12-23 NOTE — Patient Instructions (Signed)
Medication Instructions:  STOP the Aspirin  *If you need a refill on your cardiac medications before your next appointment, please call your pharmacy*   Lab Work: None ordered If you have labs (blood work) drawn today and your tests are completely normal, you will receive your results only by: MyChart Message (if you have MyChart) OR A paper copy in the mail If you have any lab test that is abnormal or we need to change your treatment, we will call you to review the results.   Testing/Procedures: None ordered   Follow-Up: At [facility], you and your health needs are our priority.  As part of our continuing mission to provide you with exceptional heart care, we have created designated Provider Care Teams.  These Care Teams include your primary Cardiologist (physician) and Advanced Practice Providers (APPs -  Physician Assistants and Nurse Practitioners) who all work together to provide you with the care you need, when you need it.  We recommend signing up for the patient portal called "MyChart".  Sign up information is provided on this After Visit Summary.  MyChart is used to connect with patients for Virtual Visits (Telemedicine).  Patients are able to view lab/test results, encounter notes, upcoming appointments, etc.  Non-urgent messages can be sent to your provider as well.   To learn more about what you can do with MyChart, go to [email or web address].    Your next appointment:   Follow up with Dr. Kirke Corin as needed

## 2023-01-02 ENCOUNTER — Other Ambulatory Visit: Payer: Self-pay | Admitting: Family

## 2023-01-14 ENCOUNTER — Ambulatory Visit: Payer: Medicare Other | Admitting: Cardiology

## 2023-01-16 ENCOUNTER — Ambulatory Visit: Admission: RE | Admit: 2023-01-16 | Payer: Medicare Other | Source: Ambulatory Visit

## 2023-03-30 ENCOUNTER — Encounter: Payer: Self-pay | Admitting: Family

## 2023-03-30 ENCOUNTER — Ambulatory Visit (INDEPENDENT_AMBULATORY_CARE_PROVIDER_SITE_OTHER): Payer: Medicare Other | Admitting: Family

## 2023-03-30 VITALS — BP 128/82 | HR 81 | Temp 98.0°F | Ht 71.0 in | Wt 186.2 lb

## 2023-03-30 DIAGNOSIS — J40 Bronchitis, not specified as acute or chronic: Secondary | ICD-10-CM | POA: Diagnosis not present

## 2023-03-30 DIAGNOSIS — J029 Acute pharyngitis, unspecified: Secondary | ICD-10-CM

## 2023-03-30 LAB — POCT INFLUENZA A/B
Influenza A, POC: NEGATIVE
Influenza B, POC: NEGATIVE

## 2023-03-30 LAB — POCT RAPID STREP A (OFFICE): Rapid Strep A Screen: NEGATIVE

## 2023-03-30 LAB — POC COVID19 BINAXNOW: SARS Coronavirus 2 Ag: NEGATIVE

## 2023-03-30 NOTE — Progress Notes (Signed)
Assessment & Plan:  Sore throat -     POC COVID-19 BinaxNow -     POCT rapid strep A -     POCT Influenza A/B -     Culture, Group A Strep  Bronchitis Assessment & Plan: No acute respiratory distress.  Negative strep, flu and COVID.  I did obtain throat culture as well today.  Discussed more likely viral presentation and advised Mucinex or Mucinex DM.  If symptoms persist, consider azithromycin.      Return precautions given.   Risks, benefits, and alternatives of the medications and treatment plan prescribed today were discussed, and patient expressed understanding.   Education regarding symptom management and diagnosis given to patient on AVS either electronically or printed.  No follow-ups on file.  Rennie Plowman, FNP  Subjective:    Patient ID: Deborah Mcconnell, female    DOB: 04/26/1951, 72 y.o.   MRN: [id number]  CC: Deborah Mcconnell is a 72 y.o. female who presents today for an acute visit.    HPI: Complains of loss of voice, dry cough x 7 days, and then over the weekend, sore throat  worsened over the past 2 days.  Endorses nasal congestion, sneezing  This morning both eyes were crusted shut.  She feels like sputum needs to come up with her cough , especially in the morning.   No fever, vision changes, sob, wheezing.   She is sleeping well.   She has tried loratadine with relief of sneezing and running nose.    Covid test negative 4 days ago   Never smoker  Allergies: Ciprofloxacin, Ofloxacin, Quinolones, Actonel [risedronate], Augmentin [amoxicillin-pot clavulanate], Other, and Bactrim [sulfamethoxazole-trimethoprim] Current Outpatient Medications on File Prior to Visit  Medication Sig Dispense Refill   Ascorbic Acid (VITAMIN C) 1000 MG tablet Take 1,000 mg by mouth daily.     B Complex Vitamins (VITAMIN B COMPLEX PO) Take by mouth.     Bacillus Coagulans-Inulin (PROBIOTIC) 1-250 BILLION-MG CAPS Take by mouth.     calcium carbonate (OS-CAL)  600 MG TABS tablet Take by mouth. Take 1,200 mg by mouth.     cephALEXin (KEFLEX) 250 MG capsule Take one after intercourse 90 capsule 1   chlorhexidine (HIBICLENS) 4 % external liquid Apply topically daily as needed. 120 mL 0   CRANBERRY PO Take by mouth.     diclofenac sodium (VOLTAREN) 1 % GEL Apply 4 g topically 4 (four) times daily. 1 Tube 3   ibuprofen (ADVIL,MOTRIN) 200 MG tablet Take 600 mg by mouth 2 (two) times daily. prn     simvastatin (ZOCOR) 40 MG tablet TAKE 1 TABLET BY MOUTH AT BEDTIME 90 tablet 3   No current facility-administered medications on file prior to visit.    Review of Systems  Constitutional:  Negative for chills and fever.  HENT:  Positive for congestion and sore throat. Negative for sinus pain.   Respiratory:  Positive for cough. Negative for shortness of breath and wheezing.   Cardiovascular:  Negative for chest pain and palpitations.  Gastrointestinal:  Negative for nausea and vomiting.      Objective:    BP 128/82   Pulse 81   Temp 98 F (36.7 C) (Oral)   Ht 5\' 11"  (1.803 m)   Wt 186 lb 3.2 oz (84.5 kg)   SpO2 98%   BMI 25.97 kg/m   BP Readings from Last 3 Encounters:  03/30/23 128/82  12/23/22 126/62  10/17/22 120/78   Wt Readings from Last  3 Encounters:  03/30/23 186 lb 3.2 oz (84.5 kg)  12/23/22 185 lb (83.9 kg)  10/17/22 190 lb 6.4 oz (86.4 kg)    Physical Exam Vitals reviewed.  Constitutional:      Appearance: She is well-developed.  HENT:     Head: Normocephalic and atraumatic.     Right Ear: Hearing, tympanic membrane, ear canal and external ear normal. No decreased hearing noted. No drainage, swelling or tenderness. No middle ear effusion. No foreign body. Tympanic membrane is not erythematous or bulging.     Left Ear: Hearing, tympanic membrane, ear canal and external ear normal. No decreased hearing noted. No drainage, swelling or tenderness.  No middle ear effusion. No foreign body. Tympanic membrane is not erythematous or  bulging.     Nose: Nose normal. No rhinorrhea.     Right Sinus: No maxillary sinus tenderness or frontal sinus tenderness.     Left Sinus: No maxillary sinus tenderness or frontal sinus tenderness.     Mouth/Throat:     Pharynx: Uvula midline. Oropharyngeal exudate (right tonsil) present. No posterior oropharyngeal erythema.     Tonsils: No tonsillar abscesses.  Eyes:     Conjunctiva/sclera: Conjunctivae normal.  Cardiovascular:     Rate and Rhythm: Regular rhythm.     Pulses: Normal pulses.     Heart sounds: Normal heart sounds.  Pulmonary:     Effort: Pulmonary effort is normal.     Breath sounds: Normal breath sounds. No wheezing, rhonchi or rales.  Lymphadenopathy:     Head:     Right side of head: No submental, submandibular, tonsillar, preauricular, posterior auricular or occipital adenopathy.     Left side of head: No submental, submandibular, tonsillar, preauricular, posterior auricular or occipital adenopathy.     Cervical: No cervical adenopathy.  Skin:    General: Skin is warm and dry.  Neurological:     Mental Status: She is alert.  Psychiatric:        Speech: Speech normal.        Behavior: Behavior normal.        Thought Content: Thought content normal.

## 2023-03-30 NOTE — Patient Instructions (Addendum)
Plain mucinex or mucinex DM.  Do not use Muxinex D ( decongestant).   Another option would be Delysm for cough.  Let me know how you are doing.

## 2023-03-30 NOTE — Assessment & Plan Note (Signed)
No acute respiratory distress.  Negative strep, flu and COVID.  I did obtain throat culture as well today.  Discussed more likely viral presentation and advised Mucinex or Mucinex DM.  If symptoms persist, consider azithromycin.

## 2023-04-01 LAB — CULTURE, GROUP A STREP
MICRO NUMBER:: 15530572
SPECIMEN QUALITY:: ADEQUATE

## 2023-04-21 DIAGNOSIS — Z853 Personal history of malignant neoplasm of breast: Secondary | ICD-10-CM | POA: Diagnosis not present

## 2023-04-21 DIAGNOSIS — R928 Other abnormal and inconclusive findings on diagnostic imaging of breast: Secondary | ICD-10-CM | POA: Diagnosis not present

## 2023-04-21 DIAGNOSIS — Z1231 Encounter for screening mammogram for malignant neoplasm of breast: Secondary | ICD-10-CM | POA: Diagnosis not present

## 2023-04-21 DIAGNOSIS — Z23 Encounter for immunization: Secondary | ICD-10-CM | POA: Diagnosis not present

## 2023-04-21 DIAGNOSIS — N6489 Other specified disorders of breast: Secondary | ICD-10-CM | POA: Diagnosis not present

## 2023-05-05 ENCOUNTER — Ambulatory Visit: Payer: Medicare Other | Admitting: Urology

## 2023-05-05 VITALS — BP 157/84 | HR 80 | Ht 71.5 in | Wt 186.2 lb

## 2023-05-05 DIAGNOSIS — N390 Urinary tract infection, site not specified: Secondary | ICD-10-CM

## 2023-05-05 DIAGNOSIS — R3129 Other microscopic hematuria: Secondary | ICD-10-CM | POA: Diagnosis not present

## 2023-05-05 DIAGNOSIS — N3001 Acute cystitis with hematuria: Secondary | ICD-10-CM

## 2023-05-05 DIAGNOSIS — R82998 Other abnormal findings in urine: Secondary | ICD-10-CM

## 2023-05-05 DIAGNOSIS — Z87440 Personal history of urinary (tract) infections: Secondary | ICD-10-CM

## 2023-05-05 LAB — URINALYSIS, COMPLETE
Bilirubin, UA: NEGATIVE
Glucose, UA: NEGATIVE
Ketones, UA: NEGATIVE
Nitrite, UA: NEGATIVE
Protein,UA: NEGATIVE
Specific Gravity, UA: 1.015 (ref 1.005–1.030)
Urobilinogen, Ur: 0.2 mg/dL (ref 0.2–1.0)
pH, UA: 6.5 (ref 5.0–7.5)

## 2023-05-05 LAB — MICROSCOPIC EXAMINATION

## 2023-05-05 MED ORDER — NITROFURANTOIN MONOHYD MACRO 100 MG PO CAPS: 100.0000 mg | ORAL_CAPSULE | Freq: Two times a day (BID) | ORAL | 0 refills | Status: DC

## 2023-05-05 NOTE — Progress Notes (Signed)
I,Amy L Pierron,acting as a scribe for Vanna Scotland, MD.,have documented all relevant documentation on the behalf of Vanna Scotland, MD,as directed by  Vanna Scotland, MD while in the presence of Vanna Scotland, MD.  05/05/2023 7:23 PM   Deborah Mcconnell [birth year] [id number]  Referring provider: Allegra Grana, FNP [address] [place],  [place] [address]  Chief Complaint  Patient presents with   Follow-up    HPI: 72 year-old female with a personal history of recurrent urinary tract infections and bladder erythema presents today for annual follow up.  She was taken to the operating room for bladder biopsies on 04/28/2022 for hyperemia on the posterior bladder wall. Surgical pathology was consistent with mild submucosal hyperemia, but no pathology was identified. No records of urinary tract infections over the past year.  She was taking Keflex after intercourse.  She has a personal history of breast cancer, remotely, 2014.  Urinalysis today shows 11 to 30 white blood cells, 3 to 10 red blood cells, nitrate negative.   She reports doing well overall this past year. However, this past Saturday she experienced odor and dysuria. She started taking Cephalexin and yesterday took 500 mg twice. Her symptoms have started to improve.   She has also been recovering from a 5 or so week respiratory infection and during that time she didn't engage in intercourse.   She takes cranberry tablets and probiotics.   Results for orders placed or performed in visit on 05/05/23  Microscopic Examination   Urine  Result Value Ref Range   WBC, UA 11-30 (A) 0 - 5 /hpf   RBC, Urine 3-10 (A) 0 - 2 /hpf   Epithelial Cells (non renal) 0-10 0 - 10 /hpf   Mucus, UA Present (A) Not Estab.   Bacteria, UA Few None seen/Few  Urinalysis, Complete  Result Value Ref Range   Specific Gravity, UA 1.015 1.005 - 1.030   pH, UA 6.5 5.0 - 7.5   Color, UA Yellow Yellow   Appearance Ur Hazy (A)  Clear   Leukocytes,UA Trace (A) Negative   Protein,UA Negative Negative/Trace   Glucose, UA Negative Negative   Ketones, UA Negative Negative   RBC, UA 1+ (A) Negative   Bilirubin, UA Negative Negative   Urobilinogen, Ur 0.2 0.2 - 1.0 mg/dL   Nitrite, UA Negative Negative   Microscopic Examination See below:     PMH: Past Medical History:  Diagnosis Date   Arthritis    osteoarthritis   Breast cancer, left (HCC) 2014   Hx Lumpectomy and Rad tx's.   Heart palpitations    Hyperlipidemia    Osteopenia    previously taking Evista and Actonel   Osteoporosis    PONV (postoperative nausea and vomiting)    Vertigo     Surgical History: Past Surgical History:  Procedure Laterality Date   CHOLECYSTECTOMY     CYSTOSCOPY W/ RETROGRADES Bilateral 04/28/2022   Procedure: CYSTOSCOPY WITH RETROGRADE PYELOGRAM;  Surgeon: Vanna Scotland, MD;  Location: [facility];  Service: Urology;  Laterality: Bilateral;   CYSTOSCOPY WITH BIOPSY N/A 04/28/2022   Procedure: CYSTOSCOPY WITH BLADDER BIOPSY;  Surgeon: Vanna Scotland, MD;  Location: [facility];  Service: Urology;  Laterality: N/A;   lt breast lumpectomy  2014    Home Medications:  Allergies as of 05/05/2023       Reactions   Ciprofloxacin Anaphylaxis   Causes death   Ofloxacin Anaphylaxis   Causes Death Other reaction(s): Not available   Quinolones Anaphylaxis  Actonel [risedronate]    Stomach pain   Augmentin [amoxicillin-pot Clavulanate] Nausea And Vomiting   Other Other (See Comments)   Bactrim [sulfamethoxazole-trimethoprim] Hives   Itching, hives, and GI distress        Medication List        Accurate as of May 05, 2023  7:23 PM. If you have any questions, ask your nurse or doctor.          calcium carbonate 600 MG Tabs tablet Commonly known as: OS-CAL Take by mouth. Take 1,200 mg by mouth.   cephALEXin 250 MG capsule Commonly known as: Keflex Take one after intercourse   CRANBERRY PO Take by mouth.    diclofenac sodium 1 % Gel Commonly known as: VOLTAREN Apply 4 g topically 4 (four) times daily.   Hibiclens 4 % external liquid Generic drug: chlorhexidine Apply topically daily as needed.   ibuprofen 200 MG tablet Commonly known as: ADVIL Take 600 mg by mouth 2 (two) times daily. prn   naproxen 500 MG tablet Commonly known as: NAPROSYN Take 500 mg by mouth as needed.   nitrofurantoin (macrocrystal-monohydrate) 100 MG capsule Commonly known as: MACROBID Take 1 capsule (100 mg total) by mouth 2 (two) times daily.   Probiotic 1-250 BILLION-MG Caps Take by mouth.   simvastatin 40 MG tablet Commonly known as: ZOCOR TAKE 1 TABLET BY MOUTH AT BEDTIME   VITAMIN B COMPLEX PO Take by mouth.   vitamin C 1000 MG tablet Take 1,000 mg by mouth daily.        Allergies:  Allergies  Allergen Reactions   Ciprofloxacin Anaphylaxis    Causes death   Ofloxacin Anaphylaxis    Causes Death Other reaction(s): Not available   Quinolones Anaphylaxis   Actonel [Risedronate]     Stomach pain   Augmentin [Amoxicillin-Pot Clavulanate] Nausea And Vomiting   Other Other (See Comments)   Bactrim [Sulfamethoxazole-Trimethoprim] Hives    Itching, hives, and GI distress    Family History: Family History  Problem Relation Age of Onset   Osteoporosis Mother    Hypertension Mother    Osteoarthritis Mother    Osteoporosis Sister    Osteoarthritis Sister    Osteoarthritis Maternal Grandmother    Diabetes Maternal Grandfather    Cancer Paternal Grandfather        unknown   Prostate cancer Neg Hx    Bladder Cancer Neg Hx    Kidney cancer Neg Hx    CAD Neg Hx    CVA Neg Hx     Social History:  reports that she has never smoked. She has never used smokeless tobacco. She reports current alcohol use. She reports that she does not use drugs.   Physical Exam: BP (!) 157/84   Pulse 80   Ht 5' 11.5" (1.816 m)   Wt 186 lb 4 oz (84.5 kg)   BMI 25.61 kg/m   Constitutional:  Alert and  oriented, No acute distress. HEENT: [place] AT, moist mucus membranes.  Trachea midline, no masses. Neurologic: Grossly intact, no focal deficits, moving all 4 extremities. Psychiatric: Normal mood and affect.   Urinalysis    Component Value Date/Time   COLORURINE YELLOW 02/03/2017 0817   APPEARANCEUR Hazy (A) 05/05/2023 0910   LABSPEC <=1.005 (A) 02/03/2017 0817   PHURINE 6.0 02/03/2017 0817   GLUCOSEU Negative 05/05/2023 0910   GLUCOSEU NEGATIVE 02/03/2017 0817   HGBUR SMALL (A) 02/03/2017 0817   BILIRUBINUR Negative 05/05/2023 0910   KETONESUR NEGATIVE 02/03/2017 0817   PROTEINUR  Negative 05/05/2023 0910   UROBILINOGEN 0.2 02/03/2017 0817   NITRITE Negative 05/05/2023 0910   NITRITE NEGATIVE 02/03/2017 0817   LEUKOCYTESUR Trace (A) 05/05/2023 0910    Lab Results  Component Value Date   LABMICR See below: 05/05/2023   WBCUA 11-30 (A) 05/05/2023   RBCUA 0-2 02/24/2017   LABEPIT 0-10 05/05/2023   MUCUS Present (A) 05/05/2023   BACTERIA Few 05/05/2023     Assessment & Plan:    1. Acute cystitis  - Self treated with Keflex, 500 mg twice yesterday, by doubling her prophylactic dose.   - Urinalysis is suspicious. Will send a culture, although likely to be negative due to previous antibiotic exposure.   - Will try Macrobid twice daily for 10 days to expose her to a different antibiotic.  2. Recurrent UTI's  - Encouraged adding D-Mannose to her supplement regimen.   - Continue her post coital low dose suppression since it has been effective.   - Had a lengthy discussion about antibiotic stewardship. If she doesn't end up taking these frequently over a reasonable amount of years, will continue this regimen. However with the onset of symptoms, encouraged her not to self treat but to call the office for an appointment.  Return in about 1 year (around 05/04/2024).  I have reviewed the above documentation for accuracy and completeness, and I agree with the above.   Vanna Scotland,  MD   [facility] [address], [address] [place], [place] [address] [phone]

## 2023-05-05 NOTE — Patient Instructions (Signed)
For UTI prevention take cranberry tablets, probiotic, D-Mannose daily.  

## 2023-05-08 LAB — CULTURE, URINE COMPREHENSIVE

## 2023-05-14 ENCOUNTER — Ambulatory Visit (INDEPENDENT_AMBULATORY_CARE_PROVIDER_SITE_OTHER): Payer: Medicare Other | Admitting: Family

## 2023-05-14 ENCOUNTER — Encounter: Payer: Self-pay | Admitting: Family

## 2023-05-14 VITALS — BP 138/86 | HR 67 | Temp 97.6°F | Ht 71.5 in | Wt 187.2 lb

## 2023-05-14 DIAGNOSIS — M25551 Pain in right hip: Secondary | ICD-10-CM | POA: Diagnosis not present

## 2023-05-14 NOTE — Progress Notes (Signed)
Assessment & Plan:  Right hip pain Assessment & Plan: Concern for hip pathology.  Pending bilateral hip x-rays, lumbar.  Patient will take as needed naproxen.  Referral to physical therapy  Orders: -     DG Lumbar Spine Complete; Future -     DG HIPS BILAT W OR W/O PELVIS 3-4 VIEWS; Future -     Ambulatory referral to Physical Therapy     Return precautions given.   Risks, benefits, and alternatives of the medications and treatment plan prescribed today were discussed, and patient expressed understanding.   Education regarding symptom management and diagnosis given to patient on AVS either electronically or printed.  Return in about 2 months (around 07/14/2023).  Rennie Plowman, FNP  Subjective:    Patient ID: Deborah Mcconnell, female    DOB: Jul 20, 1950, 72 y.o.   MRN: [id number]  CC: Deborah Mcconnell is a 72 y.o. female who presents today for an acute visit.    HPI: Complains of low back pain, right groin pain for 3 months  No saddle anesthesia, numbness, trauma, urinary or fecal incontinence, dysuria, constipation, abdominal pain, leg pain, leg swelling, knee pain.   Soft chair causes low back pain to worsen. Groin pain worse when she rolls over in bed. Most comfortable laying supine. Pain is improved in the morning.       Naprosyn with some relief.    She follows with Dr. Vanna Scotland for microscopic hematuria, recurrent UTI.  Treated 05/05/2023 with Macrobid for 10 days; she didn't start taking Macrobid.  Lumbar x-ray 08/25/2019 with advanced lumbar spondylosis  H/o breast cancer   Allergies: Ciprofloxacin, Ofloxacin, Quinolones, Actonel [risedronate], Augmentin [amoxicillin-pot clavulanate], Other, and Bactrim [sulfamethoxazole-trimethoprim] Current Outpatient Medications on File Prior to Visit  Medication Sig Dispense Refill   Ascorbic Acid (VITAMIN C) 1000 MG tablet Take 1,000 mg by mouth daily.     B Complex Vitamins (VITAMIN B COMPLEX PO) Take by  mouth.     calcium carbonate (OS-CAL) 600 MG TABS tablet Take by mouth. Take 1,200 mg by mouth.     cephALEXin (KEFLEX) 250 MG capsule Take one after intercourse 90 capsule 1   chlorhexidine (HIBICLENS) 4 % external liquid Apply topically daily as needed. 120 mL 0   CRANBERRY PO Take by mouth.     diclofenac sodium (VOLTAREN) 1 % GEL Apply 4 g topically 4 (four) times daily. 1 Tube 3   naproxen (NAPROSYN) 500 MG tablet Take 500 mg by mouth as needed.     simvastatin (ZOCOR) 40 MG tablet TAKE 1 TABLET BY MOUTH AT BEDTIME 90 tablet 3   No current facility-administered medications on file prior to visit.    Review of Systems  Constitutional:  Negative for chills and fever.  Respiratory:  Negative for cough.   Cardiovascular:  Negative for chest pain and palpitations.  Gastrointestinal:  Negative for nausea and vomiting.  Genitourinary:  Negative for difficulty urinating and dysuria.  Musculoskeletal:  Positive for back pain.  Neurological:  Negative for numbness.      Objective:    BP 138/86   Pulse 67   Temp 97.6 F (36.4 C) (Oral)   Ht 5' 11.5" (1.816 m)   Wt 187 lb 3.2 oz (84.9 kg)   SpO2 97%   BMI 25.75 kg/m   BP Readings from Last 3 Encounters:  05/14/23 138/86  05/05/23 (!) 157/84  03/30/23 128/82   Wt Readings from Last 3 Encounters:  05/14/23 187 lb 3.2 oz (84.9  kg)  05/05/23 186 lb 4 oz (84.5 kg)  03/30/23 186 lb 3.2 oz (84.5 kg)    Physical Exam Vitals reviewed.  Constitutional:      Appearance: She is well-developed.  Eyes:     Conjunctiva/sclera: Conjunctivae normal.  Cardiovascular:     Rate and Rhythm: Normal rate and regular rhythm.     Pulses: Normal pulses.     Heart sounds: Normal heart sounds.  Pulmonary:     Effort: Pulmonary effort is normal.     Breath sounds: Normal breath sounds. No wheezing, rhonchi or rales.  Musculoskeletal:     Lumbar back: No swelling, edema, spasms, tenderness or bony tenderness. Normal range of motion. Negative  right straight leg raise test and negative left straight leg raise test.       Back:     Comments: Full range of motion with flexion, tension, lateral side bends. No bony tenderness. No pain, numbness, tingling elicited with single leg raise bilaterally.  Bilateral  Hip: No limp or waddling gait. Full ROM with flexion and hip rotation in flexion.    No pain of lateral hip with  (flexion-abduction-external rotation) test.   No pain with deep palpation of greater trochanter.     Skin:    General: Skin is warm and dry.  Neurological:     Mental Status: She is alert.     Sensory: No sensory deficit.     Deep Tendon Reflexes:     Reflex Scores:      Patellar reflexes are 2+ on the right side and 2+ on the left side.    Comments: Sensation and strength intact bilateral lower extremities.  Psychiatric:        Speech: Speech normal.        Behavior: Behavior normal.        Thought Content: Thought content normal.

## 2023-05-14 NOTE — Assessment & Plan Note (Signed)
Concern for hip pathology.  Pending bilateral hip x-rays, lumbar.  Patient will take as needed naproxen.  Referral to physical therapy

## 2023-05-15 ENCOUNTER — Ambulatory Visit: Payer: Medicare Other

## 2023-05-15 ENCOUNTER — Other Ambulatory Visit: Payer: Medicare Other

## 2023-05-15 ENCOUNTER — Ambulatory Visit: Payer: Medicare Other | Admitting: Family

## 2023-05-15 DIAGNOSIS — M25551 Pain in right hip: Secondary | ICD-10-CM | POA: Diagnosis not present

## 2023-05-15 DIAGNOSIS — M5136 Other intervertebral disc degeneration, lumbar region with discogenic back pain only: Secondary | ICD-10-CM | POA: Diagnosis not present

## 2023-05-15 DIAGNOSIS — M4316 Spondylolisthesis, lumbar region: Secondary | ICD-10-CM | POA: Diagnosis not present

## 2023-05-15 DIAGNOSIS — M1611 Unilateral primary osteoarthritis, right hip: Secondary | ICD-10-CM | POA: Diagnosis not present

## 2023-05-15 DIAGNOSIS — R1031 Right lower quadrant pain: Secondary | ICD-10-CM | POA: Diagnosis not present

## 2023-05-19 ENCOUNTER — Ambulatory Visit: Payer: Medicare Other | Admitting: Family

## 2023-05-22 ENCOUNTER — Encounter: Payer: Self-pay | Admitting: Family

## 2023-05-26 ENCOUNTER — Other Ambulatory Visit: Payer: Self-pay | Admitting: Family

## 2023-05-26 ENCOUNTER — Telehealth: Payer: Self-pay | Admitting: Family

## 2023-05-26 DIAGNOSIS — M545 Low back pain, unspecified: Secondary | ICD-10-CM

## 2023-05-26 NOTE — Telephone Encounter (Signed)
Call [facility] and ask to have x-ray of the hips and lumbar spine read

## 2023-05-26 NOTE — Telephone Encounter (Signed)
Spoke to [place] at [facility] to get someone to read xray, she will have someone to read it results should be in chart tomorrow

## 2023-06-05 DIAGNOSIS — Z23 Encounter for immunization: Secondary | ICD-10-CM | POA: Diagnosis not present

## 2023-06-10 DIAGNOSIS — M1611 Unilateral primary osteoarthritis, right hip: Secondary | ICD-10-CM | POA: Diagnosis not present

## 2023-06-10 DIAGNOSIS — M25551 Pain in right hip: Secondary | ICD-10-CM | POA: Diagnosis not present

## 2023-06-12 DIAGNOSIS — M1611 Unilateral primary osteoarthritis, right hip: Secondary | ICD-10-CM | POA: Diagnosis not present

## 2023-06-12 DIAGNOSIS — M25551 Pain in right hip: Secondary | ICD-10-CM | POA: Diagnosis not present

## 2023-06-15 DIAGNOSIS — M1611 Unilateral primary osteoarthritis, right hip: Secondary | ICD-10-CM | POA: Diagnosis not present

## 2023-06-15 DIAGNOSIS — M25551 Pain in right hip: Secondary | ICD-10-CM | POA: Diagnosis not present

## 2023-06-19 DIAGNOSIS — M25551 Pain in right hip: Secondary | ICD-10-CM | POA: Diagnosis not present

## 2023-06-19 DIAGNOSIS — M1611 Unilateral primary osteoarthritis, right hip: Secondary | ICD-10-CM | POA: Diagnosis not present

## 2023-06-25 DIAGNOSIS — M25551 Pain in right hip: Secondary | ICD-10-CM | POA: Diagnosis not present

## 2023-06-25 DIAGNOSIS — M1611 Unilateral primary osteoarthritis, right hip: Secondary | ICD-10-CM | POA: Diagnosis not present

## 2023-06-29 DIAGNOSIS — M25551 Pain in right hip: Secondary | ICD-10-CM | POA: Diagnosis not present

## 2023-06-29 DIAGNOSIS — M1611 Unilateral primary osteoarthritis, right hip: Secondary | ICD-10-CM | POA: Diagnosis not present

## 2023-07-03 DIAGNOSIS — M25551 Pain in right hip: Secondary | ICD-10-CM | POA: Diagnosis not present

## 2023-07-03 DIAGNOSIS — M1611 Unilateral primary osteoarthritis, right hip: Secondary | ICD-10-CM | POA: Diagnosis not present

## 2023-07-09 DIAGNOSIS — M25551 Pain in right hip: Secondary | ICD-10-CM | POA: Diagnosis not present

## 2023-07-09 DIAGNOSIS — M1611 Unilateral primary osteoarthritis, right hip: Secondary | ICD-10-CM | POA: Diagnosis not present

## 2023-07-14 ENCOUNTER — Encounter: Payer: Self-pay | Admitting: Family

## 2023-07-14 ENCOUNTER — Ambulatory Visit (INDEPENDENT_AMBULATORY_CARE_PROVIDER_SITE_OTHER): Payer: Medicare Other | Admitting: Family

## 2023-07-14 VITALS — BP 136/76 | HR 78 | Temp 97.8°F | Ht 71.0 in | Wt 186.2 lb

## 2023-07-14 DIAGNOSIS — M1611 Unilateral primary osteoarthritis, right hip: Secondary | ICD-10-CM | POA: Diagnosis not present

## 2023-07-14 DIAGNOSIS — M25551 Pain in right hip: Secondary | ICD-10-CM

## 2023-07-14 NOTE — Progress Notes (Signed)
 Assessment & Plan:  Right hip pain Assessment & Plan: Chronic, stable with some improvement.  Encouraged her to anticipate and premedicate with naproxen as needed.  Reviewed lumbar and hip x-rays with patient and encouraged patient to consider orthopedic or physiatry consult in the future due to known moderate arthritis to discuss treatment options.       Return precautions given.   Risks, benefits, and alternatives of the medications and treatment plan prescribed today were discussed, and patient expressed understanding.   Education regarding symptom management and diagnosis given to patient on AVS either electronically or printed.  Return for Complete Physical Exam.  Rollene Northern, FNP  Subjective:    Patient ID: Deborah Mcconnell, female    DOB: [birth year], 73 y.o.   MRN: [id number]  CC: Deborah Mcconnell is a 73 y.o. female who presents today for follow up.   HPI: Right hip pain persist however overall she feels has improved.  She has good days and bad days   she is doing PT for right hip pain and feels this has been helpful.  Hip pain is lessened on days she has physical therapy  She takes naproxen as needed for relief.     Allergies: Ciprofloxacin , Ofloxacin, Quinolones, Actonel [risedronate], Augmentin  [amoxicillin -pot clavulanate], Other, and Bactrim  [sulfamethoxazole -trimethoprim ] Current Outpatient Medications on File Prior to Visit  Medication Sig Dispense Refill   Ascorbic Acid (VITAMIN C) 1000 MG tablet Take 1,000 mg by mouth daily.     B Complex Vitamins (VITAMIN B COMPLEX PO) Take by mouth.     calcium carbonate (OS-CAL) 600 MG TABS tablet Take by mouth. Take 1,200 mg by mouth.     cephALEXin  (KEFLEX ) 250 MG capsule Take one after intercourse 90 capsule 1   chlorhexidine  (HIBICLENS ) 4 % external liquid Apply topically daily as needed. 120 mL 0   CRANBERRY PO Take by mouth.     diclofenac  sodium (VOLTAREN ) 1 % GEL Apply 4 g topically 4 (four) times  daily. 1 Tube 3   naproxen (NAPROSYN) 500 MG tablet Take 500 mg by mouth as needed.     simvastatin  (ZOCOR ) 40 MG tablet TAKE 1 TABLET BY MOUTH AT BEDTIME 90 tablet 3   No current facility-administered medications on file prior to visit.    Review of Systems  Constitutional:  Negative for chills and fever.  Respiratory:  Negative for cough.   Cardiovascular:  Negative for chest pain and palpitations.  Gastrointestinal:  Negative for nausea and vomiting.  Musculoskeletal:  Positive for arthralgias.  Neurological:  Negative for numbness.      Objective:    BP 136/76   Pulse 78   Temp 97.8 F (36.6 C) (Oral)   Ht 5' 11 (1.803 m)   Wt 186 lb 3.2 oz (84.5 kg)   SpO2 99%   BMI 25.97 kg/m  BP Readings from Last 3 Encounters:  07/14/23 136/76  05/14/23 138/86  05/05/23 (!) 157/84   Wt Readings from Last 3 Encounters:  07/14/23 186 lb 3.2 oz (84.5 kg)  05/14/23 187 lb 3.2 oz (84.9 kg)  05/05/23 186 lb 4 oz (84.5 kg)    Physical Exam Vitals reviewed.  Constitutional:      Appearance: She is well-developed.  Eyes:     Conjunctiva/sclera: Conjunctivae normal.  Cardiovascular:     Rate and Rhythm: Normal rate and regular rhythm.     Pulses: Normal pulses.     Heart sounds: Normal heart sounds.  Pulmonary:     Effort:  Pulmonary effort is normal.     Breath sounds: Normal breath sounds. No wheezing, rhonchi or rales.  Skin:    General: Skin is warm and dry.  Neurological:     Mental Status: She is alert.  Psychiatric:        Speech: Speech normal.        Behavior: Behavior normal.        Thought Content: Thought content normal.

## 2023-07-15 NOTE — Assessment & Plan Note (Addendum)
 Chronic, stable with some improvement.  Encouraged her to anticipate and premedicate with naproxen as needed.  Reviewed lumbar and hip x-rays with patient and encouraged patient to consider orthopedic or physiatry consult in the future due to known moderate arthritis to discuss treatment options.

## 2023-07-22 DIAGNOSIS — M1611 Unilateral primary osteoarthritis, right hip: Secondary | ICD-10-CM | POA: Diagnosis not present

## 2023-07-22 DIAGNOSIS — M25551 Pain in right hip: Secondary | ICD-10-CM | POA: Diagnosis not present

## 2023-07-29 DIAGNOSIS — M1611 Unilateral primary osteoarthritis, right hip: Secondary | ICD-10-CM | POA: Diagnosis not present

## 2023-07-29 DIAGNOSIS — M25551 Pain in right hip: Secondary | ICD-10-CM | POA: Diagnosis not present

## 2023-07-31 DIAGNOSIS — M25551 Pain in right hip: Secondary | ICD-10-CM | POA: Diagnosis not present

## 2023-07-31 DIAGNOSIS — M1611 Unilateral primary osteoarthritis, right hip: Secondary | ICD-10-CM | POA: Diagnosis not present

## 2023-08-18 DIAGNOSIS — M1611 Unilateral primary osteoarthritis, right hip: Secondary | ICD-10-CM | POA: Diagnosis not present

## 2023-08-18 DIAGNOSIS — M25551 Pain in right hip: Secondary | ICD-10-CM | POA: Diagnosis not present

## 2023-08-24 DIAGNOSIS — M1611 Unilateral primary osteoarthritis, right hip: Secondary | ICD-10-CM | POA: Diagnosis not present

## 2023-08-24 DIAGNOSIS — M25551 Pain in right hip: Secondary | ICD-10-CM | POA: Diagnosis not present

## 2023-09-03 DIAGNOSIS — M1611 Unilateral primary osteoarthritis, right hip: Secondary | ICD-10-CM | POA: Diagnosis not present

## 2023-09-03 DIAGNOSIS — M25551 Pain in right hip: Secondary | ICD-10-CM | POA: Diagnosis not present

## 2023-09-04 ENCOUNTER — Ambulatory Visit: Payer: Medicare Other | Admitting: *Deleted

## 2023-09-04 VITALS — Ht 71.0 in | Wt 182.0 lb

## 2023-09-04 DIAGNOSIS — Z0000 Encounter for general adult medical examination without abnormal findings: Secondary | ICD-10-CM

## 2023-09-04 NOTE — Patient Instructions (Signed)
 Deborah Mcconnell , Thank you for taking time to come for your Medicare Wellness Visit. I appreciate your ongoing commitment to your health goals. Please review the following plan we discussed and let me know if I can assist you in the future.   Referrals/Orders/Follow-Ups/Clinician Recommendations: Remember to update your shingles vaccines.  This is a list of the screening recommended for you and due dates:  Health Maintenance  Topic Date Due   Zoster (Shingles) Vaccine (1 of 2) 08/15/1969   Pap Smear  10/17/2022   COVID-19 Vaccine (5 - 2024-25 season) 07/31/2023   Colon Cancer Screening  07/24/2024   Medicare Annual Wellness Visit  09/03/2024   DTaP/Tdap/Td vaccine (2 - Td or Tdap) 10/04/2024   Mammogram  03/31/2025   Pneumonia Vaccine  Completed   Flu Shot  Completed   DEXA scan (bone density measurement)  Completed   Hepatitis C Screening  Completed   HPV Vaccine  Aged Out    Advanced directives: (Copy Requested) Please bring a copy of your health care power of attorney and living will to the office to be added to your chart at your convenience. You can mail to [facility] [address] [place], [place] [address] or email to [email or web address]  Next Medicare Annual Wellness Visit scheduled for next year: Yes 09/09/24 @ 9:30

## 2023-09-04 NOTE — Progress Notes (Signed)
 Subjective:   Deborah Mcconnell is a 73 y.o. who presents for a Medicare Wellness preventive visit.  Visit Complete: Virtual I connected with  [name] on 09/04/23 by a audio enabled telemedicine application and verified that I am speaking with the correct person using two identifiers.  Patient Location: Home  Provider Location: Home Office  I discussed the limitations of evaluation and management by telemedicine. The patient expressed understanding and agreed to proceed.  Vital Signs: Because this visit was a virtual/telehealth visit, some criteria may be missing or patient reported. Any vitals not documented were not able to be obtained and vitals that have been documented are patient reported.  VideoDeclined- This patient declined Librarian, academic. Therefore the visit was completed with audio only.  AWV Questionnaire: Yes: Patient Medicare AWV questionnaire was completed by the patient on 09/01/23; I have confirmed that all information answered by patient is correct and no changes since this date.  Cardiac Risk Factors include: advanced age (>23men, >31 women);dyslipidemia     Objective:    Today's Vitals   09/04/23 1129 09/04/23 1130  Weight: 182 lb (82.6 kg)   Height: 5\' 11"  (1.803 m)   PainSc:  5    Body mass index is 25.38 kg/m.     09/04/2023   11:40 AM 09/02/2022    4:06 PM 04/28/2022    6:32 AM 04/18/2022    2:23 PM 08/27/2021    9:14 AM 08/22/2020    1:40 PM 08/22/2019   11:09 AM  Advanced Directives  Does Patient Have a Medical Advance Directive? Yes Yes Yes Yes Yes Yes Yes  Type of Estate agent of [place];Living will Healthcare Power of [place];Living will Healthcare Power of [place];Living will  Healthcare Power of [place];Living will Healthcare Power of [place];Living will Living will;Healthcare Power of Attorney  Does patient want to make changes to medical advance directive?  No - Patient  declined No - Patient declined  No - Patient declined No - Patient declined No - Patient declined  Copy of Healthcare Power of Attorney in Chart? No - copy requested No - copy requested No - copy requested  No - copy requested No - copy requested No - copy requested  Would patient like information on creating a medical advance directive?   No - Patient declined No - Patient declined       Current Medications (verified) Outpatient Encounter Medications as of 09/04/2023  Medication Sig   Ascorbic Acid (VITAMIN C) 1000 MG tablet Take 1,000 mg by mouth daily.   B Complex Vitamins (VITAMIN B COMPLEX PO) Take by mouth.   calcium carbonate (OS-CAL) 600 MG TABS tablet Take by mouth. Take 1,200 mg by mouth.   cephALEXin (KEFLEX) 250 MG capsule Take one after intercourse   chlorhexidine (HIBICLENS) 4 % external liquid Apply topically daily as needed.   CRANBERRY PO Take by mouth.   diclofenac sodium (VOLTAREN) 1 % GEL Apply 4 g topically 4 (four) times daily.   naproxen (NAPROSYN) 500 MG tablet Take 500 mg by mouth as needed.   simvastatin (ZOCOR) 40 MG tablet TAKE 1 TABLET BY MOUTH AT BEDTIME   No facility-administered encounter medications on file as of 09/04/2023.    Allergies (verified) Ciprofloxacin, Ofloxacin, Quinolones, Actonel [risedronate], Augmentin [amoxicillin-pot clavulanate], Other, and Bactrim [sulfamethoxazole-trimethoprim]   History: Past Medical History:  Diagnosis Date   Arthritis    osteoarthritis   Breast cancer, left (HCC) 2014   Hx Lumpectomy and Rad  tx's.   Heart palpitations    Hyperlipidemia    Osteopenia    previously taking Evista and Actonel   Osteoporosis    PONV (postoperative nausea and vomiting)    Vertigo    Past Surgical History:  Procedure Laterality Date   CHOLECYSTECTOMY     CYSTOSCOPY W/ RETROGRADES Bilateral 04/28/2022   Procedure: CYSTOSCOPY WITH RETROGRADE PYELOGRAM;  Surgeon: Vanna Scotland, MD;  Location: [facility] OR[facility];  Service: Urology;   Laterality: Bilateral;   CYSTOSCOPY WITH BIOPSY N/A 04/28/2022   Procedure: CYSTOSCOPY WITH BLADDER BIOPSY;  Surgeon: Vanna Scotland, MD;  Location: [facility];  Service: Urology;  Laterality: N/A;   lt breast lumpectomy  2014   Family History  Problem Relation Age of Onset   Osteoporosis Mother    Hypertension Mother    Osteoarthritis Mother    Osteoporosis Sister    Osteoarthritis Sister    Osteoarthritis Maternal Grandmother    Diabetes Maternal Grandfather    Cancer Paternal Grandfather        unknown   Prostate cancer Neg Hx    Bladder Cancer Neg Hx    Kidney cancer Neg Hx    CAD Neg Hx    CVA Neg Hx    Social History   Socioeconomic History   Marital status: Married    Spouse name: Not on file   Number of children: Not on file   Years of education: Not on file   Highest education level: Bachelor's degree (e.g., BA, AB, BS)  Occupational History   Not on file  Tobacco Use   Smoking status: Never   Smokeless tobacco: Never  Vaping Use   Vaping status: Never Used  Substance and Sexual Activity   Alcohol use: Yes    Alcohol/week: 0.0 standard drinks of alcohol    Comment: rare   Drug use: No   Sexual activity: Yes  Other Topics Concern   Not on file  Social History Narrative   'lindy'      Works at [employer] in [place].       Married.       Exercise- not excercising.          Social Drivers of Corporate investment banker Strain: Low Risk  (09/04/2023)   Overall Financial Resource Strain (CARDIA)    Difficulty of Paying Living Expenses: Not hard at all  Food Insecurity: No Food Insecurity (09/04/2023)   Hunger Vital Sign    Worried About Running Out of Food in the Last Year: Never true    Ran Out of Food in the Last Year: Never true  Transportation Needs: No Transportation Needs (09/04/2023)   PRAPARE - Administrator, Civil Service (Medical): No    Lack of Transportation (Non-Medical): No  Physical Activity: Insufficiently  Active (09/04/2023)   Exercise Vital Sign    Days of Exercise per Week: 3 days    Minutes of Exercise per Session: 30 min  Stress: No Stress Concern Present (09/04/2023)   [employer] of Occupational Health - Occupational Stress Questionnaire    Feeling of Stress : Not at all  Social Connections: Socially Integrated (09/04/2023)   Social Connection and Isolation Panel [NHANES]    Frequency of Communication with Friends and Family: More than three times a week    Frequency of Social Gatherings with Friends and Family: More than three times a week    Attends Religious Services: More than 4 times per year    Active Member of [employer]  or Organizations: Yes    Attends Engineer, structural: More than 4 times per year    Marital Status: Married    Tobacco Counseling Counseling given: Not Answered    Clinical Intake:  Pre-visit preparation completed: Yes  Pain : 0-10 Pain Score: 5  Pain Type: Chronic pain Pain Location: Groin Pain Descriptors / Indicators: Dull Pain Frequency: Intermittent     BMI - recorded: 25.38 Nutritional Status: BMI 25 -29 Overweight Nutritional Risks: None Diabetes: No  How often do you need to have someone help you when you read instructions, pamphlets, or other written materials from your doctor or pharmacy?: 1 - Never  Interpreter Needed?: No  Information entered by :: R. Whitnie Deleon LPN   Activities of Daily Living     09/01/2023    7:03 PM  In your present state of health, do you have any difficulty performing the following activities:  Hearing? 0  Vision? 0  Difficulty concentrating or making decisions? 0  Walking or climbing stairs? 0  Dressing or bathing? 0  Doing errands, shopping? 0  Preparing Food and eating ? N  Using the Toilet? N  In the past six months, have you accidently leaked urine? N  Do you have problems with loss of bowel control? N  Managing your Medications? N  Housekeeping or managing your Housekeeping? N     Patient Care Team: Allegra Grana, FNP as PCP - General (Family Medicine)  Indicate any recent Medical Services you may have received from other than [facility] providers in the past year (date may be approximate).     Assessment:   This is a routine wellness examination for [place].  Hearing/Vision screen Hearing Screening - Comments:: No issues Vision Screening - Comments:: readers   Goals Addressed             This Visit's Progress    Patient Stated       Want to start back exercising more after getting hip issue resolved       Depression Screen     09/04/2023   11:34 AM 07/14/2023    8:36 AM 05/14/2023    3:39 PM 03/30/2023   11:36 AM 10/17/2022    1:57 PM 10/16/2021    1:38 PM 08/27/2021    9:15 AM  PHQ 2/9 Scores  PHQ - 2 Score 0 0 0 0 0 0 0  PHQ- 9 Score 0    0 0     Fall Risk     09/01/2023    7:03 PM 07/14/2023    8:36 AM 05/14/2023    3:39 PM 03/30/2023   11:36 AM 10/17/2022    1:57 PM  Fall Risk   Falls in the past year? 0 0 0 0 0  Number falls in past yr: 0 0 0 0 0  Injury with Fall? 0 0 0 0 0  Risk for fall due to : No Fall Risks No Fall Risks No Fall Risks No Fall Risks No Fall Risks  Follow up Falls prevention discussed;Falls evaluation completed Falls evaluation completed Falls evaluation completed Falls evaluation completed Falls evaluation completed    MEDICARE RISK AT HOME:  Medicare Risk at Home Any stairs in or around the home?: (Patient-Rptd) Yes If so, are there any without handrails?: (Patient-Rptd) No Home free of loose throw rugs in walkways, pet beds, electrical cords, etc?: (Patient-Rptd) Yes Adequate lighting in your home to reduce risk of falls?: (Patient-Rptd) Yes Life alert?: (Patient-Rptd) No Grab bars in  the bathroom?: (Patient-Rptd) No Shower chair or bench in shower?: (Patient-Rptd) Yes Elevated toilet seat or a handicapped toilet?: (Patient-Rptd) Yes  TIMED UP AND GO:  Was the test performed?  No  Cognitive Function:  6CIT completed    08/19/2018    8:24 AM 08/18/2017    8:40 AM 08/15/2016    8:49 AM  MMSE - Mini Mental State Exam  Orientation to time 5 5 5   Orientation to Place 5 5 5   Registration 3 3 3   Attention/ Calculation 5 5 5   Recall 3 3 3   Language- name 2 objects 2 2 2   Language- repeat 1 1 1   Language- follow 3 step command 3 3 3   Language- read & follow direction 1 1 1   Write a sentence 1 1 1   Copy design 1 1 1   Total score 30 30 30         09/04/2023   11:40 AM 09/02/2022    4:04 PM 08/27/2021    9:26 AM 08/22/2019   11:23 AM 08/15/2016    8:25 AM  6CIT Screen  What Year? 0 points 0 points 0 points 0 points 0 points  What month? 0 points 0 points 0 points 0 points 0 points  What time? 0 points 0 points 0 points 0 points 0 points  Count back from 20 0 points 0 points 0 points 0 points 0 points  Months in reverse 0 points 0 points 0 points 0 points 0 points  Repeat phrase 0 points 0 points 0 points 0 points 0 points  Total Score 0 points 0 points 0 points 0 points 0 points    Immunizations Immunization History  Administered Date(s) Administered   Fluad Quad(high Dose 65+) 04/26/2019, 03/12/2020, 04/17/2021, 04/14/2022   Fluad Trivalent(High Dose 65+) 06/05/2023   Influenza Whole 04/13/2013   Influenza, High Dose Seasonal PF 02/27/2016, 04/17/2017, 03/31/2018   Influenza,inj,Quad PF,6+ Mos 06/13/2015   Influenza-Unspecified 04/03/2014, 06/13/2015   Moderna Covid-19 Fall Seasonal Vaccine 55yrs & older 06/05/2023   Moderna Sars-Covid-2 Vaccination 08/07/2019, 09/07/2019, 09/03/2020   PPD Test 03/14/2020   Pneumococcal Conjugate-13 09/10/2015   Pneumococcal Polysaccharide-23 04/28/2012, 08/18/2017   Tdap 10/05/2014   Zoster, Live 05/08/2014    Screening Tests Health Maintenance  Topic Date Due   Zoster Vaccines- Shingrix (1 of 2) 08/15/1969   Cervical Cancer Screening (Pap smear)  10/17/2022   COVID-19 Vaccine (5 - 2024-25 season) 07/31/2023   Colonoscopy  07/24/2024    Medicare Annual Wellness (AWV)  09/03/2024   DTaP/Tdap/Td (2 - Td or Tdap) 10/04/2024   MAMMOGRAM  03/31/2025   Pneumonia Vaccine 47+ Years old  Completed   INFLUENZA VACCINE  Completed   DEXA SCAN  Completed   Hepatitis C Screening  Completed   HPV VACCINES  Aged Out    Health Maintenance  Health Maintenance Due  Topic Date Due   Zoster Vaccines- Shingrix (1 of 2) 08/15/1969   Cervical Cancer Screening (Pap smear)  10/17/2022   COVID-19 Vaccine (5 - 2024-25 season) 07/31/2023   Health Maintenance Items Addressed: Discussed with patient the need to up date shingles vaccines. Patient stated that her endocrinologist orders her Bone density.    Additional Screening:  Vision Screening: Recommended annual ophthalmology exams for early detection of glaucoma and other disorders of the eye. Up to date [facility]  Dental Screening: Recommended annual dental exams for proper oral hygiene  Community Resource Referral / Chronic Care Management: CRR required this visit?  No   CCM required  this visit?  No     Plan:     I have personally reviewed and noted the following in the patient's chart:   Medical and social history Use of alcohol, tobacco or illicit drugs  Current medications and supplements including opioid prescriptions. Patient is not currently taking opioid prescriptions. Functional ability and status Nutritional status Physical activity Advanced directives List of other physicians Hospitalizations, surgeries, and ER visits in previous 12 months Vitals Screenings to include cognitive, depression, and falls Referrals and appointments  In addition, I have reviewed and discussed with patient certain preventive protocols, quality metrics, and best practice recommendations. A written personalized care plan for preventive services as well as general preventive health recommendations were provided to patient.     Sydell Axon, LPN   07/05/1094   After Visit Summary:  (MyChart) Due to this being a telephonic visit, the after visit summary with patients personalized plan was offered to patient via MyChart   Notes: Nothing significant to report at this time.

## 2023-09-08 ENCOUNTER — Encounter: Payer: Self-pay | Admitting: Family

## 2023-09-10 DIAGNOSIS — M25551 Pain in right hip: Secondary | ICD-10-CM | POA: Diagnosis not present

## 2023-09-10 DIAGNOSIS — M1611 Unilateral primary osteoarthritis, right hip: Secondary | ICD-10-CM | POA: Diagnosis not present

## 2023-09-11 ENCOUNTER — Encounter: Payer: Self-pay | Admitting: *Deleted

## 2023-09-11 NOTE — Telephone Encounter (Signed)
 CD placed up front for pickup and pt notified via mychart

## 2023-09-13 NOTE — Telephone Encounter (Signed)
 Last read by [name] "[name]" at 3:55PM on 09/11/2023.

## 2023-09-23 ENCOUNTER — Ambulatory Visit (INDEPENDENT_AMBULATORY_CARE_PROVIDER_SITE_OTHER): Admitting: Family

## 2023-09-23 ENCOUNTER — Encounter: Payer: Self-pay | Admitting: Family

## 2023-09-23 VITALS — BP 128/82 | HR 80 | Temp 98.0°F | Ht 71.0 in | Wt 189.2 lb

## 2023-09-23 DIAGNOSIS — M62838 Other muscle spasm: Secondary | ICD-10-CM | POA: Insufficient documentation

## 2023-09-23 MED ORDER — CYCLOBENZAPRINE HCL 5 MG PO TABS: 5.0000 mg | ORAL_TABLET | Freq: Every evening | ORAL | 1 refills | Status: DC | PRN

## 2023-09-23 NOTE — Assessment & Plan Note (Signed)
 Reproducible thoracic back pain.  Presentation most consistent with muscular spasm.  Advised naproxen with Flexeril for the next 5 to 10 days.  Exercises provided.  Advised heat/icing regimen.  Patient and I jointly agreed to defer imaging at this time.  She does have follow-up for her hip pain orthopedics next month.  She will let me know how she is doing

## 2023-09-23 NOTE — Progress Notes (Signed)
 Assessment & Plan:  Muscle spasm Assessment & Plan: Reproducible thoracic back pain.  Presentation most consistent with muscular spasm.  Advised naproxen with Flexeril for the next 5 to 10 days.  Exercises provided.  Advised heat/icing regimen.  Patient and I jointly agreed to defer imaging at this time.  She does have follow-up for her hip pain orthopedics next month.  She will let me know how she is doing  Orders: -     Cyclobenzaprine HCl; Take 1-2 tablets (5-10 mg total) by mouth at bedtime as needed for muscle spasms.  Dispense: 30 tablet; Refill: 1     Return precautions given.   Risks, benefits, and alternatives of the medications and treatment plan prescribed today were discussed, and patient expressed understanding.   Education regarding symptom management and diagnosis given to patient on AVS either electronically or printed.  Return in about 6 months (around 03/25/2024).  Rennie Plowman, FNP  Subjective:    Patient ID: Deborah Mcconnell, female    DOB: 1951-01-30, 73 y.o.   MRN: [id number]  CC: Deborah Mcconnell is a 73 y.o. female who presents today for an acute visit.    HPI: Complains mid upper back, center, episodic First episode 5 days ago after she hugged her daughter. Second episode she was sitting in a chair. Third episode when she was in the classroom after she reached her arm up.   She has one episode every day since. Lasts under a minute, then self resolves.   She feels 'sore' after pain.   Pain starts at center of thoracic back and radiated over BL shoulders and down her arms Pain exquisitte when it occurs  No weakness, numbness, HA, vision changes,   No injury.   No associated CP, left arm numbness, jaw pain, N, diaphoresis.   History of cholecystectomy Never smoker Lumbar x-ray 05/20/2023 is moderate to severe multilevel degenerative disc disease. Cervical Xray 12/2020 Mild to moderate multilevel disc space height loss and osteophytosis of  C4 through C7 with preserved upper cervical disc spaces. Moderate multilevel facet degenerative change  Orthopedic consult next month.   Allergies: Ciprofloxacin, Ofloxacin, Quinolones, Actonel [risedronate], Augmentin [amoxicillin-pot clavulanate], Other, and Bactrim [sulfamethoxazole-trimethoprim] Current Outpatient Medications on File Prior to Visit  Medication Sig Dispense Refill   Ascorbic Acid (VITAMIN C) 1000 MG tablet Take 1,000 mg by mouth daily.     B Complex Vitamins (VITAMIN B COMPLEX PO) Take by mouth.     calcium carbonate (OS-CAL) 600 MG TABS tablet Take by mouth. Take 1,200 mg by mouth.     cephALEXin (KEFLEX) 250 MG capsule Take one after intercourse 90 capsule 1   chlorhexidine (HIBICLENS) 4 % external liquid Apply topically daily as needed. 120 mL 0   CRANBERRY PO Take by mouth.     diclofenac sodium (VOLTAREN) 1 % GEL Apply 4 g topically 4 (four) times daily. 1 Tube 3   naproxen (NAPROSYN) 500 MG tablet Take 500 mg by mouth as needed.     simvastatin (ZOCOR) 40 MG tablet TAKE 1 TABLET BY MOUTH AT BEDTIME 90 tablet 3   No current facility-administered medications on file prior to visit.    Review of Systems  Constitutional:  Negative for chills and fever.  Respiratory:  Negative for cough and shortness of breath.   Cardiovascular:  Negative for chest pain and palpitations.  Gastrointestinal:  Negative for nausea and vomiting.  Musculoskeletal:  Positive for back pain.  Neurological:  Negative for numbness.  Objective:    BP 128/82   Pulse 80   Temp 98 F (36.7 C) (Oral)   Ht 5\' 11"  (1.803 m)   Wt 189 lb 3.2 oz (85.8 kg)   SpO2 98%   BMI 26.39 kg/m   BP Readings from Last 3 Encounters:  09/23/23 128/82  07/14/23 136/76  05/14/23 138/86   Wt Readings from Last 3 Encounters:  09/23/23 189 lb 3.2 oz (85.8 kg)  09/04/23 182 lb (82.6 kg)  07/14/23 186 lb 3.2 oz (84.5 kg)    Physical Exam Vitals reviewed.  Constitutional:      Appearance: She  is well-developed.  Eyes:     Conjunctiva/sclera: Conjunctivae normal.  Cardiovascular:     Rate and Rhythm: Normal rate and regular rhythm.     Pulses: Normal pulses.     Heart sounds: Normal heart sounds.  Pulmonary:     Effort: Pulmonary effort is normal.     Breath sounds: Normal breath sounds. No wheezing, rhonchi or rales.     Comments: No back or chest pain with deep inspiration Chest:     Chest wall: No tenderness.  Musculoskeletal:     Right shoulder: No swelling. Normal range of motion.     Left shoulder: No swelling. Normal range of motion.       Arms:     Cervical back: Tenderness present. No swelling or bony tenderness. No pain with movement. Normal range of motion.     Thoracic back: No swelling or bony tenderness. Normal range of motion.     Comments: Central thoracic reproducible tenderness.  No bony step-off, edema, rash.  Skin intact.  Skin:    General: Skin is warm and dry.  Neurological:     Mental Status: She is alert.  Psychiatric:        Speech: Speech normal.        Behavior: Behavior normal.        Thought Content: Thought content normal.

## 2023-09-23 NOTE — Patient Instructions (Addendum)
 Suspect muscle spasm  Start naprosyn ( take with food) and flexeril daily for the next 7-10 days.   Heat or ice.   Do not drive or operate heavy machinery while on muscle relaxant. Please do not drink alcohol. Only take this medication as needed for acute muscle spasm at bedtime. This medication make you feel drowsy so be very careful.  Stop taking if become too drowsy or somnolent as this puts you at risk for falls. Please contact our office with any questions.   Let me know  Thoracic Strain Rehab Ask your health care provider which exercises are safe for you. Do exercises exactly as told by your provider and adjust them as directed. It is normal to feel mild stretching, pulling, tightness, or discomfort as you do these exercises. Stop right away if you feel sudden pain or your pain gets worse. Do not begin these exercises until told by your provider. Stretching and range-of-motion exercise This exercise warms up your muscles and joints and improves the movement and flexibility of your back and shoulders. This exercise also helps to relieve pain. Chest and spine stretch  Lie down on your back on a firm surface. Roll a towel or a small blanket so it is about 4 inches (10 cm) in diameter. Put the towel under the middle of your back so it is under your spine, but not under your shoulder blades. Put your hands behind your head and let your elbows fall to your sides. This will increase your stretch. Take a deep breath (inhale). Hold for __________ seconds. Relax after you breathe out (exhale). Repeat __________ times. Complete this exercise __________ times a day. Strengthening exercises These exercises build strength and endurance in your back and your shoulder blade muscles. Endurance is the ability to use your muscles for a long time, even after they get tired. Alternating arm and leg raises  Get on your hands and knees on a firm surface. If you are on a hard floor, you may want to use  padding, such as an exercise mat, to cushion your knees. Line up your arms and legs. Your hands should be directly below your shoulders, and your knees should be directly below your hips. Lift your left leg behind you. At the same time, raise your right arm and straighten it in front of you. Do not lift your leg higher than your hip. Do not lift your arm higher than your shoulder. Keep your abdominal and back muscles tight. Keep your hips facing the ground. Do not arch your back. Carefully stay balanced. Do not hold your breath. Hold for __________ seconds. Slowly return to the starting position and repeat with your right leg and your left arm. Repeat __________ times. Complete this exercise __________ times a day. Straight arm rows This exercise is also called the shoulder extension exercise. Stand with your feet shoulder width apart. Secure an exercise band to a stable object in front of you so the band is at or above shoulder height. Hold one end of the exercise band in each hand. Straighten your elbows and lift your hands up to shoulder height. Step back, away from the secured end of the exercise band, until the band stretches. Squeeze your shoulder blades together and pull your hands down to the sides of your thighs. Stop when your hands are straight down by your sides. This is shoulder extension. Do not let your hands go behind your body. Hold for __________ seconds. Slowly return to the starting position.  Repeat __________ times. Complete this exercise __________ times a day. Rowing scapular retraction This is an exercise in which the shoulder blades (scapulae) are pulled toward each other (retraction). Sit in a stable chair without armrests, or stand up. Secure an exercise band to a stable object in front of you so the band is at shoulder height. Hold one end of the exercise band in each hand. Your palms should face toward each other. Bring your arms out straight in front of  you. Step back, away from the secured end of the exercise band, until the band stretches. Pull the band backward. As you do this, bend your elbows and squeeze your shoulder blades together, but avoid letting the rest of your body move. Do not shrug your shoulders upward while you do this. Stop when your elbows are at your sides or slightly behind your body. Hold for __________ seconds. Slowly straighten your arms to return to the starting position. Repeat __________ times. Complete this exercise __________ times a day. Posture and body mechanics Good posture and healthy body mechanics can help to relieve stress in your body's tissues and joints. Body mechanics refers to the movements and positions of your body while you do your daily activities. Posture is part of body mechanics. Good posture means: Your spine is in its natural S-curve position (neutral). Your shoulders are pulled back slightly. Your head is not tipped forward. Follow these guidelines to improve your posture and body mechanics in your everyday activities. Standing  When standing, keep your spine neutral and your feet about hip width apart. Keep a slight bend in your knees. Your ears, shoulders, and hips should line up with each other. When you do a task in which you lean forward while standing in one place for a long time, place one foot up on a stable object that is 2-4 inches (5-10 cm) high, such as a footstool. This helps keep your spine neutral. Sitting  When sitting, keep your spine neutral and keep your feet flat on the floor. Use a footrest if needed. Keep your thighs parallel to the floor. Avoid rounding your shoulders, and avoid tilting your head forward. When working at a desk or a computer, keep your desk at a height where your hands are slightly lower than your elbows. Slide your chair under your desk so you are close enough to maintain good posture. When working at a computer, place your monitor at a height where  you are looking straight ahead and you do not have to tilt your head forward or downward to look at the screen. Resting When lying down and resting, avoid positions that are most painful for you. If you have pain with activities such as sitting, bending, stooping, or squatting (flexion-basedactivities), lie in a position in which your body does not bend very much. For example, avoid curling up on your side with your arms and knees near your chest (fetal position). If you have pain with activities such as standing for a long time or reaching with your arms (extension-basedactivities), lie with your spine in a neutral position and bend your knees slightly. Try the following positions: Lie on your side with a pillow between your knees. Lie on your back with a pillow under your knees.  Lifting  When lifting objects, keep your feet at least shoulder width apart and tighten your abdominal muscles. Bend your knees and hips and keep your spine neutral. It is important to lift using the strength of your legs, not your  back. Do not lock your knees straight out. Always ask for help to lift heavy or awkward objects. This information is not intended to replace advice given to you by your health care provider. Make sure you discuss any questions you have with your health care provider. Document Revised: 11/28/2022 Document Reviewed: 02/03/2022 Elsevier Patient Education  2024 [employer]Cervical Strain and Sprain Rehab Ask your health care provider which exercises are safe for you. Do exercises exactly as told by your health care provider and adjust them as directed. It is normal to feel mild stretching, pulling, tightness, or discomfort as you do these exercises. Stop right away if you feel sudden pain or your pain gets worse. Do not begin these exercises until told by your health care provider. Stretching and range-of-motion exercises Cervical side bending  Using good posture, sit on a stable chair or  stand up. Without moving your shoulders, slowly tilt your left / right ear to your shoulder until you feel a stretch in the neck muscles on the opposite side. You should be looking straight ahead. Hold for __________ seconds. Repeat with the other side of your neck. Repeat __________ times. Complete this exercise __________ times a day. Cervical rotation  Using good posture, sit on a stable chair or stand up. Slowly turn your head to the side as if you are looking over your left / right shoulder. Keep your eyes level with the ground. Stop when you feel a stretch along the side and the back of your neck. Hold for __________ seconds. Repeat this by turning to your other side. Repeat __________ times. Complete this exercise __________ times a day. Thoracic extension and pectoral stretch  Roll a towel or a small blanket so it is about 4 inches (10 cm) in diameter. Lie down on your back on a firm surface. Put the towel in the middle of your back across your spine. It should not be under your shoulder blades. Put your hands behind your head and let your elbows fall out to your sides. Hold for __________ seconds. Repeat __________ times. Complete this exercise __________ times a day. Strengthening exercises Upper cervical flexion  Lie on your back with a thin pillow behind your head or a small, rolled-up towel under your neck. Gently tuck your chin toward your chest and nod your head down to look toward your feet. Do not lift your head off the pillow. Hold for __________ seconds. Release the tension slowly. Relax your neck muscles completely before you repeat this exercise. Repeat __________ times. Complete this exercise __________ times a day. Cervical extension  Stand about 6 inches (15 cm) away from a wall, with your back facing the wall. Place a soft object, about 6-8 inches (15-20 cm) in diameter, between the back of your head and the wall. A soft object could be a small pillow, a  ball, or a folded towel. Gently tilt your head back and press into the soft object. Keep your jaw and forehead relaxed. Hold for __________ seconds. Release the tension slowly. Relax your neck muscles completely before you repeat this exercise. Repeat __________ times. Complete this exercise __________ times a day. Posture and body mechanics Body mechanics refer to the movements and positions of your body while you do your daily activities. Posture is part of body mechanics. Good posture and healthy body mechanics can help to relieve stress in your body's tissues and joints. Good posture means that your spine is in its natural S-curve position (your spine is neutral),  your shoulders are pulled back slightly, and your head is not tipped forward. The following are general guidelines for using improved posture and body mechanics in your everyday activities. Sitting  When sitting, keep your spine neutral and keep your feet flat on the floor. Use a footrest, if needed, and keep your thighs parallel to the floor. Avoid rounding your shoulders. Avoid tilting your head forward. When working at a desk or a computer, keep your desk at a height where your hands are slightly lower than your elbows. Slide your chair under your desk so you are close enough to maintain good posture. When working at a computer, place your monitor at a height where you are looking straight ahead and you do not have to tilt your head forward or downward to look at the screen. Standing  When standing, keep your spine neutral and keep your feet about hip-width apart. Keep a slight bend in your knees. Your ears, shoulders, and hips should line up. When you do a task in which you stand in one place for a long time, place one foot up on a stable object that is 2-4 inches (5-10 cm) high, such as a footstool. This helps keep your spine neutral. Resting When lying down and resting, avoid positions that are most painful for you. Try to  support your neck in a neutral position. You can use a contour pillow or a small rolled-up towel. Your pillow should support your neck but not push on it. This information is not intended to replace advice given to you by your health care provider. Make sure you discuss any questions you have with your health care provider. Document Revised: 10/20/2022 Document Reviewed: 01/06/2022 Elsevier Patient Education  2024 [employer].

## 2023-10-05 DIAGNOSIS — M1611 Unilateral primary osteoarthritis, right hip: Secondary | ICD-10-CM | POA: Diagnosis not present

## 2023-10-19 ENCOUNTER — Ambulatory Visit: Payer: Medicare Other | Admitting: Family

## 2023-10-19 ENCOUNTER — Encounter: Payer: Self-pay | Admitting: Family

## 2023-10-19 VITALS — BP 128/80 | HR 76 | Temp 97.6°F | Ht 71.5 in | Wt 185.4 lb

## 2023-10-19 DIAGNOSIS — I700 Atherosclerosis of aorta: Secondary | ICD-10-CM | POA: Diagnosis not present

## 2023-10-19 DIAGNOSIS — M8580 Other specified disorders of bone density and structure, unspecified site: Secondary | ICD-10-CM

## 2023-10-19 DIAGNOSIS — E559 Vitamin D deficiency, unspecified: Secondary | ICD-10-CM

## 2023-10-19 DIAGNOSIS — E782 Mixed hyperlipidemia: Secondary | ICD-10-CM

## 2023-10-19 DIAGNOSIS — Z0000 Encounter for general adult medical examination without abnormal findings: Secondary | ICD-10-CM

## 2023-10-19 LAB — LIPID PANEL
Cholesterol: 142 mg/dL (ref 0–200)
HDL: 59 mg/dL (ref >39.00)
LDL Cholesterol: 70 mg/dL (ref 0–99)
NonHDL: 82.94
Total CHOL/HDL Ratio: 2
Triglycerides: 66 mg/dL (ref 0.0–149.0)
VLDL: 13.2 mg/dL (ref 0.0–40.0)

## 2023-10-19 LAB — VITAMIN D 25 HYDROXY (VIT D DEFICIENCY, FRACTURES): VITD: 34.82 ng/mL (ref 30.00–100.00)

## 2023-10-19 LAB — TSH: TSH: 2.41 u[IU]/mL (ref 0.35–5.50)

## 2023-10-19 NOTE — Progress Notes (Signed)
 Assessment & Plan:  Routine physical examination Assessment & Plan: No longer screening for cervical cancer.  Encouraged self breast exam.  Congratulated patient on diligence to exercise.  Orders: -     VITAMIN D  25 Hydroxy (Vit-D Deficiency, Fractures) -     Lipid panel -     TSH  Atherosclerosis of aorta (HCC) -     Lipid panel -     TSH  Mixed hyperlipidemia  Vitamin D  deficiency -     VITAMIN D  25 Hydroxy (Vit-D Deficiency, Fractures)  Osteopenia, unspecified location -     VITAMIN D  25 Hydroxy (Vit-D Deficiency, Fractures) -     TSH     Return precautions given.   Risks, benefits, and alternatives of the medications and treatment plan prescribed today were discussed, and patient expressed understanding.   Education regarding symptom management and diagnosis given to patient on AVS either electronically or printed.  Return in about 1 year (around 10/18/2024) for Complete Physical Exam.  Bascom Bossier, FNP  Subjective:    Patient ID: Deborah Mcconnell, female    DOB: 1951/02/04, 73 y.o.   MRN: [id number]  CC: Deborah Mcconnell is a 73 y.o. female who presents today for physical exam.    HPI: Feels well today.  No new complaints   Seen for suspected thoracic muscle spasm 09/23/23, since resolved.   Follow up with orthopedics 10/05/2023 right hip osteoarthritis.  Scheduled for total hip replacement.  She is also scheduled a second opinion for anterior approach.   Colorectal Cancer Screening: UTD , repeat in 2026.  Breast Cancer Screening: Mammogram UTD 04/21/23; h/o left breast cancer Cervical Cancer Screening: She is no longer screening for cervical cancer.  Pap smear obtained 10/16/2021 negative malignancy.  Benign reactive changes.  Pap smear 04/17/2020 negative HPV, malignancy.  Inflammation and atrophic changes are present.  Pap smear obtained 10/08/2015 negative malignancy, negative HPV  Bone Health screening/DEXA for 65+: She is compliant with calcium,  vitamin D  .following with [facility] for osteoporosis.  Seen 09/29/2023.  Bone density 03/26/2022, normal.    Lung Cancer Screening: Doesn't have 20 year pack year history and age > 37 years yo 4 years       Tetanus - UTD        Pneumococcal - Complete  Exercise: Gets regular exercise.   Alcohol use:  rare Smoking/tobacco use: Nonsmoker.    Health Maintenance  Topic Date Due   Zoster (Shingles) Vaccine (1 of 2) 08/15/1969   Pap Smear  10/17/2022   COVID-19 Vaccine (5 - Moderna risk 2024-25 season) 12/04/2023   Flu Shot  01/29/2024   Colon Cancer Screening  07/24/2024   Medicare Annual Wellness Visit  09/03/2024   DTaP/Tdap/Td vaccine (2 - Td or Tdap) 10/04/2024   Mammogram  03/31/2025   Pneumonia Vaccine  Completed   DEXA scan (bone density measurement)  Completed   Hepatitis C Screening  Completed   HPV Vaccine  Aged Out   Meningitis B Vaccine  Aged Out    ALLERGIES: Ciprofloxacin , Ofloxacin, Quinolones, Actonel [risedronate], Augmentin  [amoxicillin -pot clavulanate], Other, and Bactrim  [sulfamethoxazole -trimethoprim ]  Current Outpatient Medications on File Prior to Visit  Medication Sig Dispense Refill   Ascorbic Acid (VITAMIN C) 1000 MG tablet Take 1,000 mg by mouth daily.     B Complex Vitamins (VITAMIN B COMPLEX PO) Take by mouth.     calcium carbonate (OS-CAL) 600 MG TABS tablet Take by mouth. Take 1,200 mg by mouth.  chlorhexidine  (HIBICLENS ) 4 % external liquid Apply topically daily as needed. 120 mL 0   cholecalciferol (VITAMIN D3) 25 MCG (1000 UNIT) tablet Take 500 Units by mouth daily.     CRANBERRY PO Take by mouth.     cyclobenzaprine  (FLEXERIL ) 5 MG tablet Take 1-2 tablets (5-10 mg total) by mouth at bedtime as needed for muscle spasms. 30 tablet 1   diclofenac  sodium (VOLTAREN ) 1 % GEL Apply 4 g topically 4 (four) times daily. 1 Tube 3   naproxen (NAPROSYN) 500 MG tablet Take 500 mg by mouth as needed.     simvastatin  (ZOCOR ) 40 MG tablet TAKE 1  TABLET BY MOUTH AT BEDTIME 90 tablet 3   No current facility-administered medications on file prior to visit.    Review of Systems  Constitutional:  Negative for chills and fever.  Respiratory:  Negative for cough.   Cardiovascular:  Negative for chest pain and palpitations.  Gastrointestinal:  Negative for nausea and vomiting.  Musculoskeletal:  Positive for arthralgias (right hip pain).      Objective:    BP 128/80   Pulse 76   Temp 97.6 F (36.4 C) (Oral)   Ht 5' 11.5" (1.816 m)   Wt 185 lb 6.4 oz (84.1 kg)   SpO2 97%   BMI 25.50 kg/m   BP Readings from Last 3 Encounters:  10/19/23 128/80  09/23/23 128/82  07/14/23 136/76   Wt Readings from Last 3 Encounters:  10/19/23 185 lb 6.4 oz (84.1 kg)  09/23/23 189 lb 3.2 oz (85.8 kg)  09/04/23 182 lb (82.6 kg)    Physical Exam Vitals reviewed.  Constitutional:      Appearance: Normal appearance. She is well-developed.  Eyes:     Conjunctiva/sclera: Conjunctivae normal.  Neck:     Thyroid : No thyroid  mass or thyromegaly.  Cardiovascular:     Rate and Rhythm: Normal rate and regular rhythm.     Pulses: Normal pulses.     Heart sounds: Normal heart sounds.  Pulmonary:     Effort: Pulmonary effort is normal.     Breath sounds: Normal breath sounds. No wheezing, rhonchi or rales.  Chest:  Breasts:    Breasts are symmetrical.     Right: No inverted nipple, mass, nipple discharge, skin change or tenderness.     Left: No inverted nipple, mass, nipple discharge, skin change or tenderness.  Abdominal:     General: Bowel sounds are normal. There is no distension.     Palpations: Abdomen is soft. Abdomen is not rigid. There is no fluid wave or mass.     Tenderness: There is no abdominal tenderness. There is no guarding or rebound.  Lymphadenopathy:     Head:     Right side of head: No submental, submandibular, tonsillar, preauricular, posterior auricular or occipital adenopathy.     Left side of head: No submental,  submandibular, tonsillar, preauricular, posterior auricular or occipital adenopathy.     Cervical: No cervical adenopathy.     Right cervical: No superficial, deep or posterior cervical adenopathy.    Left cervical: No superficial, deep or posterior cervical adenopathy.  Skin:    General: Skin is warm and dry.  Neurological:     Mental Status: She is alert.  Psychiatric:        Speech: Speech normal.        Behavior: Behavior normal.        Thought Content: Thought content normal.

## 2023-10-21 ENCOUNTER — Encounter: Payer: Self-pay | Admitting: Family

## 2023-10-22 NOTE — Patient Instructions (Signed)
 Health Maintenance for Postmenopausal Women Menopause is a normal process in which your ability to get pregnant comes to an end. This process happens slowly over many months or years, usually between the ages of 24 and 62. Menopause is complete when you have missed your menstrual period for 12 months. It is important to talk with your health care provider about some of the most common conditions that affect women after menopause (postmenopausal women). These include heart disease, cancer, and bone loss (osteoporosis). Adopting a healthy lifestyle and getting preventive care can help to promote your health and wellness. The actions you take can also lower your chances of developing some of these common conditions. What are the signs and symptoms of menopause? During menopause, you may have the following symptoms: Hot flashes. These can be moderate or severe. Night sweats. Decrease in sex drive. Mood swings. Headaches. Tiredness (fatigue). Irritability. Memory problems. Problems falling asleep or staying asleep. Talk with your health care provider about treatment options for your symptoms. Do I need hormone replacement therapy? Hormone replacement therapy is effective in treating symptoms that are caused by menopause, such as hot flashes and night sweats. Hormone replacement carries certain risks, especially as you become older. If you are thinking about using estrogen or estrogen with progestin, discuss the benefits and risks with your health care provider. How can I reduce my risk for heart disease and stroke? The risk of heart disease, heart attack, and stroke increases as you age. One of the causes may be a change in the body's hormones during menopause. This can affect how your body uses dietary fats, triglycerides, and cholesterol. Heart attack and stroke are medical emergencies. There are many things that you can do to help prevent heart disease and stroke. Watch your blood pressure High  blood pressure causes heart disease and increases the risk of stroke. This is more likely to develop in people who have high blood pressure readings or are overweight. Have your blood pressure checked: Every 3-5 years if you are 50-75 years of age. Every year if you are 77 years old or older. Eat a healthy diet  Eat a diet that includes plenty of vegetables, fruits, low-fat dairy products, and lean protein. Do not eat a lot of foods that are high in solid fats, added sugars, or sodium. Get regular exercise Get regular exercise. This is one of the most important things you can do for your health. Most adults should: Try to exercise for at least 150 minutes each week. The exercise should increase your heart rate and make you sweat (moderate-intensity exercise). Try to do strengthening exercises at least twice each week. Do these in addition to the moderate-intensity exercise. Spend less time sitting. Even light physical activity can be beneficial. Other tips Work with your health care provider to achieve or maintain a healthy weight. Do not use any products that contain nicotine or tobacco. These products include cigarettes, chewing tobacco, and vaping devices, such as e-cigarettes. If you need help quitting, ask your health care provider. Know your numbers. Ask your health care provider to check your cholesterol and your blood sugar (glucose). Continue to have your blood tested as directed by your health care provider. Do I need screening for cancer? Depending on your health history and family history, you may need to have cancer screenings at different stages of your life. This may include screening for: Breast cancer. Cervical cancer. Lung cancer. Colorectal cancer. What is my risk for osteoporosis? After menopause, you may be  at increased risk for osteoporosis. Osteoporosis is a condition in which bone destruction happens more quickly than new bone creation. To help prevent osteoporosis or  the bone fractures that can happen because of osteoporosis, you may take the following actions: If you are 61-3 years old, get at least 1,000 mg of calcium and at least 600 international units (IU) of vitamin D per day. If you are older than age 61 but younger than age 75, get at least 1,200 mg of calcium and at least 600 international units (IU) of vitamin D per day. If you are older than age 62, get at least 1,200 mg of calcium and at least 800 international units (IU) of vitamin D per day. Smoking and drinking excessive alcohol increase the risk of osteoporosis. Eat foods that are rich in calcium and vitamin D, and do weight-bearing exercises several times each week as directed by your health care provider. How does menopause affect my mental health? Depression may occur at any age, but it is more common as you become older. Common symptoms of depression include: Feeling depressed. Changes in sleep patterns. Changes in appetite or eating patterns. Feeling an overall lack of motivation or enjoyment of activities that you previously enjoyed. Frequent crying spells. Talk with your health care provider if you think that you are experiencing any of these symptoms. General instructions See your health care provider for regular wellness exams and vaccines. This may include: Scheduling regular health, dental, and eye exams. Getting and maintaining your vaccines. These include: Influenza vaccine. Get this vaccine each year before the flu season begins. Pneumonia vaccine. Shingles vaccine. Tetanus, diphtheria, and pertussis (Tdap) booster vaccine. Your health care provider may also recommend other immunizations. Tell your health care provider if you have ever been abused or do not feel safe at home. Summary Menopause is a normal process in which your ability to get pregnant comes to an end. This condition causes hot flashes, night sweats, decreased interest in sex, mood swings, headaches, or lack  of sleep. Treatment for this condition may include hormone replacement therapy. Take actions to keep yourself healthy, including exercising regularly, eating a healthy diet, watching your weight, and checking your blood pressure and blood sugar levels. Get screened for cancer and depression. Make sure that you are up to date with all your vaccines. This information is not intended to replace advice given to you by your health care provider. Make sure you discuss any questions you have with your health care provider. Document Revised: 11/05/2020 Document Reviewed: 11/05/2020 Elsevier Patient Education  2024 [employer].

## 2023-10-22 NOTE — Assessment & Plan Note (Signed)
 No longer screening for cervical cancer.  Encouraged self breast exam.  Congratulated patient on diligence to exercise.

## 2023-10-30 ENCOUNTER — Telehealth: Payer: Self-pay | Admitting: Orthopaedic Surgery

## 2023-10-30 NOTE — Telephone Encounter (Signed)
 Patient has appt. 5/8 to discuss hip replacement. She was seen at [facility] once and has requested that to be faxed here over 3 weeks ago. Would you be willing to see pt without that note from [facility]. She is anxious to keep her appt 5/8. Please advise. [phone]

## 2023-11-02 NOTE — Telephone Encounter (Signed)
 No, seen only once.

## 2023-11-02 NOTE — Telephone Encounter (Signed)
 IC patient advised she is fine to keep her appt.

## 2023-11-05 ENCOUNTER — Ambulatory Visit: Admitting: Orthopaedic Surgery

## 2023-11-05 ENCOUNTER — Other Ambulatory Visit: Payer: Self-pay

## 2023-11-05 DIAGNOSIS — M25551 Pain in right hip: Secondary | ICD-10-CM | POA: Diagnosis not present

## 2023-11-05 MED ORDER — METHYLPREDNISOLONE ACETATE 40 MG/ML IJ SUSP
80.0000 mg | INTRAMUSCULAR | Status: AC | PRN
  Administered 2023-11-05: 80 mg via INTRA_ARTICULAR

## 2023-11-05 MED ORDER — LIDOCAINE HCL 1 % IJ SOLN
4.0000 mL | INTRAMUSCULAR | Status: AC | PRN
  Administered 2023-11-05: 4 mL

## 2023-11-05 NOTE — Progress Notes (Signed)
 Office Visit Note   Patient: Deborah Mcconnell           Date of Birth: 06/08/1951           MRN: [id number] Visit Date: 11/05/2023              Requested by: Calista Catching, FNP [address] [place],  [place] [address] PCP: Calista Catching, FNP   Assessment & Plan: Visit Diagnoses:  1. Pain of right hip     Plan: Previous x-rays from November reviewed, patient does have some moderate arthritic changes as well as some spurring on the anterior superior aspect of the acetabulum as well as inferior.  Patient's pain does appear to be likely related to degenerative changes of the hip with possible labral tearing.  Discussed with patient options at this time and given that she is already tried physical therapy and meloxicam , next step up would be trying a steroid injection and then surgery.  Patient would like to try intra-articular cortisone injection at this time.  Patient tolerated procedure well.  Discussed with patient to follow-up in 6 to 8 weeks to evaluate progress.  Follow-Up Instructions: No follow-ups on file.   Orders:  Orders Placed This Encounter  Procedures   Large Joint Inj: R hip joint   US  Guided Needle Placement - No Linked Charges   No orders of the defined types were placed in this encounter.     Procedures: Large Joint Inj: R hip joint on 11/05/2023 2:49 PM Indications: pain Details: 22 G 3.5 in needle, ultrasound-guided anterior approach Medications: 80 mg methylPREDNISolone acetate 40 MG/ML; 4 mL lidocaine  1 % Outcome: tolerated well, no immediate complications  Procedure: US -guided intra-articular hip injection, right After discussion on risks/benefits/indications and informed verbal consent was obtained, a timeout was performed. Patient was lying supine on exam table. The hip was cleaned with betadine and alcohol swabs. Then utilizing ultrasound guidance, the patient's femoral head and neck junction was identified and subsequently injected  with 4:2 lidocaine :depomedrol via an in-plane approach with ultrasound visualization of the injectate administered into the hip joint. Patient tolerated procedure well without immediate complications.  Procedure, treatment alternatives, risks and benefits explained, specific risks discussed. Consent was given by the patient and spouse. Immediately prior to procedure a time out was called to verify the correct patient, procedure, equipment, support staff and site/side marked as required. Patient was prepped and draped in the usual sterile fashion.       Clinical Data: No additional findings.   Subjective: Chief Complaint  Patient presents with   Right Hip - Pain    Patient is presenting for right hip pain.  Patient notes that she has noted some right hip pain for the past few months and states that the pain feels like it is in her inguinal region/groin.  Pain states that last November she was having hip pain that was more on the lateral aspect but now has started to come on the anterior portion of her groin.  Patient states that certain motions cause her pain such as internally rotating her hip.  Patient is very active and she is in charge of a daycare and states that she is walking a lot.  Patient states that most of her pain occurs at night and she is having to take 1 Aleve at night and sometimes it helps and sometimes it does not.    Review of Systems   Objective: Vital Signs: There were no vitals  taken for this visit.  Physical Exam  Ortho Exam Inspection reveals no gross abnormalities of the hip.  Passive logroll is painful on the right hip compared to the left.  Range of motion is full of the left hip with flexion and extension.  There is noted decreased range of motion with flexion especially gives resistance of the right hip.  FADIR FABER negative on the left side, FADIR is positive on the right side with noted decreased internal rotation.  Pain located in the groin Specialty  Comments:  No specialty comments available.  Imaging:    PMFS History: Patient Active Problem List   Diagnosis Date Noted   Muscle spasm 09/23/2023   Liver cyst 10/17/2022   Atherosclerosis of aorta (HCC) 10/17/2022   Bilateral knee pain 10/16/2021   Atrophy of cervix 10/16/2021   Encounter for long-term (current) aspirin use 04/17/2021   Pain, dental 01/21/2021   Neck pain on right side 01/09/2021   Nasal congestion 11/05/2020   COVID 11/05/2020   Onychomycosis 10/11/2020   Abdominal bloating 03/02/2020   Right wrist pain 08/27/2018   Vitamin D  deficiency 08/27/2018   Paroxysmal tachycardia (HCC) 12/28/2017   Palpitations 09/11/2017   Bronchitis 09/11/2017   Dysuria 02/03/2017   Right ear pain 02/03/2017   Right hip pain 07/29/2016   Osteoarthritis 04/02/2016   Screening for cervical cancer 10/08/2015   Pelvic pain in female 09/10/2015   Midline thoracic back pain 08/15/2015   Microscopic hematuria 05/08/2015   Vertigo 10/27/2014   Malignant neoplasm of breast (female) (HCC) 11/19/2012   Routine physical examination 10/27/2012   Screening for colon cancer 10/27/2012   Lumbar pain 06/04/2012   Hyperlipidemia 02/17/2011   Osteopenia 02/17/2011   Past Medical History:  Diagnosis Date   Arthritis    osteoarthritis   Breast cancer, left (HCC) 2014   Hx Lumpectomy and Rad tx's.   Heart palpitations    Hyperlipidemia    Osteopenia    previously taking Evista and Actonel   Osteoporosis    PONV (postoperative nausea and vomiting)    Vertigo     Family History  Problem Relation Age of Onset   Osteoporosis Mother    Hypertension Mother    Osteoarthritis Mother    Osteoporosis Sister    Osteoarthritis Sister    Osteoarthritis Maternal Grandmother    Diabetes Maternal Grandfather    Cancer Paternal Grandfather        unknown   Prostate cancer Neg Hx    Bladder Cancer Neg Hx    Kidney cancer Neg Hx    CAD Neg Hx    CVA Neg Hx     Past Surgical History:   Procedure Laterality Date   CHOLECYSTECTOMY     CYSTOSCOPY W/ RETROGRADES Bilateral 04/28/2022   Procedure: CYSTOSCOPY WITH RETROGRADE PYELOGRAM;  Surgeon: Dustin Gimenez, MD;  Location: [facility];  Service: Urology;  Laterality: Bilateral;   CYSTOSCOPY WITH BIOPSY N/A 04/28/2022   Procedure: CYSTOSCOPY WITH BLADDER BIOPSY;  Surgeon: Dustin Gimenez, MD;  Location: [facility];  Service: Urology;  Laterality: N/A;   lt breast lumpectomy  2014   Social History   Occupational History   Not on file  Tobacco Use   Smoking status: Never   Smokeless tobacco: Never  Vaping Use   Vaping status: Never Used  Substance and Sexual Activity   Alcohol use: Yes    Alcohol/week: 0.0 standard drinks of alcohol    Comment: rare   Drug use: No   Sexual activity:  Yes

## 2023-12-17 ENCOUNTER — Ambulatory Visit: Admitting: Orthopaedic Surgery

## 2023-12-17 ENCOUNTER — Encounter: Payer: Self-pay | Admitting: Orthopaedic Surgery

## 2023-12-17 DIAGNOSIS — M25551 Pain in right hip: Secondary | ICD-10-CM | POA: Diagnosis not present

## 2023-12-17 DIAGNOSIS — G8929 Other chronic pain: Secondary | ICD-10-CM | POA: Diagnosis not present

## 2023-12-17 DIAGNOSIS — M5441 Lumbago with sciatica, right side: Secondary | ICD-10-CM

## 2023-12-17 NOTE — Progress Notes (Signed)
 The patient comes in today about 6 weeks after having a steroid injection in her right hip joint under ultrasound.  She says that that helped her quite a bit and some of her pain is still present at times in the groin but she is feeling much better overall.  She is a very active 73 year old female.  She has been having more pain though in the right side of her low back more just above the pelvis at the facet joints and maybe the SI joint.  It does radiate sometimes into her hamstring on the right side.  It does not go down the foot.  On exam her right hip does move smoothly and fluidly with just a little bit of pain.  Most of her pain seems to be in her lower lumbar spine to the right side.  She is does state that she has been to physical therapy and her therapist has helped her with exercises as a pertains to the SI joint and some of her back as well.  She is getting ready to have a trip for 2 weeks.  If she gets back from a trip and is still having significant issues I want her to call the office close by then a MRI of her lumbar spine be warranted to assess the facet joints and rule out nerve compression to the right lower side.  She agrees with the treatment plan.

## 2024-01-13 NOTE — Progress Notes (Unsigned)
 01/14/2024 9:39 PM   Deborah Mcconnell 10-23-1950 [id number]  Referring provider: Dineen Rollene MATSU, FNP [address] [place],  [place] [address]  Urological history: 1. rUTI's - D-[place] -Keflex  post coitally  2. High risk hematuria - non-smoker - hematuria work up x 2 - NED - bladder biopsy (03/2022) - mild submucosal hyperemia  No chief complaint on file.  HPI: Deborah Mcconnell is a 73 y.o. woman who presents today for urgency, burning during urination and urine odor   Previous records reviewed.   UA ***    PMH: Past Medical History:  Diagnosis Date   Arthritis    osteoarthritis   Breast cancer, left (HCC) 2014   Hx Lumpectomy and Rad tx's.   Heart palpitations    Hyperlipidemia    Osteopenia    previously taking Evista and Actonel   Osteoporosis    PONV (postoperative nausea and vomiting)    Vertigo     Surgical History: Past Surgical History:  Procedure Laterality Date   CHOLECYSTECTOMY     CYSTOSCOPY W/ RETROGRADES Bilateral 04/28/2022   Procedure: CYSTOSCOPY WITH RETROGRADE PYELOGRAM;  Surgeon: Penne Knee, MD;  Location: [facility];  Service: Urology;  Laterality: Bilateral;   CYSTOSCOPY WITH BIOPSY N/A 04/28/2022   Procedure: CYSTOSCOPY WITH BLADDER BIOPSY;  Surgeon: Penne Knee, MD;  Location: [facility];  Service: Urology;  Laterality: N/A;   lt breast lumpectomy  2014    Home Medications:  Allergies as of 01/14/2024       Reactions   Ciprofloxacin  Anaphylaxis   Causes death   Ofloxacin Anaphylaxis   Causes Death Other reaction(s): Not available   Quinolones Anaphylaxis   Actonel [risedronate]    Stomach pain   Augmentin  [amoxicillin -pot Clavulanate] Nausea And Vomiting   Other Other (See Comments)   Bactrim  [sulfamethoxazole -trimethoprim ] Hives   Itching, hives, and GI distress        Medication List        Accurate as of January 13, 2024  9:39 PM. If you have any questions, ask your nurse or doctor.           calcium carbonate 600 MG Tabs tablet Commonly known as: OS-CAL Take by mouth. Take 1,200 mg by mouth.   cholecalciferol 25 MCG (1000 UNIT) tablet Commonly known as: VITAMIN D3 Take 500 Units by mouth daily.   CRANBERRY PO Take by mouth.   cyclobenzaprine  5 MG tablet Commonly known as: FLEXERIL  Take 1-2 tablets (5-10 mg total) by mouth at bedtime as needed for muscle spasms.   diclofenac  sodium 1 % Gel Commonly known as: VOLTAREN  Apply 4 g topically 4 (four) times daily.   Hibiclens  4 % external liquid Generic drug: chlorhexidine  Apply topically daily as needed.   naproxen 500 MG tablet Commonly known as: NAPROSYN Take 500 mg by mouth as needed.   simvastatin  40 MG tablet Commonly known as: ZOCOR  TAKE 1 TABLET BY MOUTH AT BEDTIME   VITAMIN B COMPLEX PO Take by mouth.   vitamin C 1000 MG tablet Take 1,000 mg by mouth daily.        Allergies:  Allergies  Allergen Reactions   Ciprofloxacin  Anaphylaxis    Causes death   Ofloxacin Anaphylaxis    Causes Death Other reaction(s): Not available   Quinolones Anaphylaxis   Actonel [Risedronate]     Stomach pain   Augmentin  [Amoxicillin -Pot Clavulanate] Nausea And Vomiting   Other Other (See Comments)   Bactrim  [Sulfamethoxazole -Trimethoprim ] Hives    Itching, hives, and  GI distress    Family History: Family History  Problem Relation Age of Onset   Osteoporosis Mother    Hypertension Mother    Osteoarthritis Mother    Osteoporosis Sister    Osteoarthritis Sister    Osteoarthritis Maternal Grandmother    Diabetes Maternal Grandfather    Cancer Paternal Grandfather        unknown   Prostate cancer Neg Hx    Bladder Cancer Neg Hx    Kidney cancer Neg Hx    CAD Neg Hx    CVA Neg Hx     Social History: See HPI for pertinent social history  ROS: Pertinent ROS in HPI  Physical Exam: There were no vitals taken for this visit.  Constitutional:  Well nourished. Alert and oriented, No acute  distress. HEENT: [place] AT, moist mucus membranes.  Trachea midline, no masses. Cardiovascular: No clubbing, cyanosis, or edema. Respiratory: Normal respiratory effort, no increased work of breathing. GU: No CVA tenderness.  No bladder fullness or masses.  Recession of labia minora, dry, pale vulvar vaginal mucosa and loss of mucosal ridges and folds.  Normal urethral meatus, no lesions, no prolapse, no discharge.   No urethral masses, tenderness and/or tenderness. No bladder fullness, tenderness or masses. *** vagina mucosa, *** estrogen effect, no discharge, no lesions, *** pelvic support, *** cystocele and *** rectocele noted.  No cervical motion tenderness.  Uterus is freely mobile and non-fixed.  No adnexal/parametria masses or tenderness noted.  Anus and perineum are without rashes or lesions.   ***  Neurologic: Grossly intact, no focal deficits, moving all 4 extremities. Psychiatric: Normal mood and affect.    Laboratory Data: See EPIC and HPI  I have reviewed the labs   Pertinent Imaging: N/A  Assessment & Plan:  ***  1. Suspected UTI  - UA grossly infected  - Urine culture pending - Started empirically on ***, will adjust if necessary once urine culture and sensitivity results are available  - Advised patient to increase fluid intake and monitor symptoms - Counseled on UTI prevention (hydration, post-coital voiding, wiping from to back) - follow-up or call if no improvement within 48-72 hours or if symptoms worsen (fever, back pain)  -Ceftin  500 mg twice daily for seven days *** -Ceftin  250 mg twice daily for seven days *** -Septra  DS twice daily for seven days *** -Augmentin  875/125 twice daily for seven days *** -Macrobid  100 mg twice daily for seven days *** -Doxycycline  100 mg twice daily for seven days *** -Omnicef 300 mg twice daily for seven days ***    No follow-ups on file.  These notes generated with voice recognition software. I apologize for typographical  errors.  Deborah Mcconnell  [facility] [address]  [address] [place], [place] [address] [phone]

## 2024-01-14 ENCOUNTER — Ambulatory Visit (INDEPENDENT_AMBULATORY_CARE_PROVIDER_SITE_OTHER): Admitting: Urology

## 2024-01-14 ENCOUNTER — Encounter: Payer: Self-pay | Admitting: Urology

## 2024-01-14 VITALS — BP 137/76 | HR 63 | Ht 71.5 in | Wt 185.0 lb

## 2024-01-14 DIAGNOSIS — R3989 Other symptoms and signs involving the genitourinary system: Secondary | ICD-10-CM | POA: Diagnosis not present

## 2024-01-14 LAB — URINALYSIS, COMPLETE
Bilirubin, UA: NEGATIVE
Glucose, UA: NEGATIVE
Nitrite, UA: NEGATIVE
Protein,UA: NEGATIVE
Specific Gravity, UA: 1.015 (ref 1.005–1.030)
Urobilinogen, Ur: 0.2 mg/dL (ref 0.2–1.0)
pH, UA: 6 (ref 5.0–7.5)

## 2024-01-14 LAB — MICROSCOPIC EXAMINATION

## 2024-01-19 ENCOUNTER — Ambulatory Visit: Payer: Self-pay | Admitting: Urology

## 2024-01-19 LAB — CULTURE, URINE COMPREHENSIVE

## 2024-01-19 MED ORDER — NITROFURANTOIN MONOHYD MACRO 100 MG PO CAPS: 100.0000 mg | ORAL_CAPSULE | Freq: Two times a day (BID) | ORAL | 0 refills | Status: DC

## 2024-02-12 ENCOUNTER — Other Ambulatory Visit: Payer: Self-pay | Admitting: Family

## 2024-02-18 ENCOUNTER — Ambulatory Visit: Admitting: Orthopaedic Surgery

## 2024-02-18 ENCOUNTER — Encounter: Payer: Self-pay | Admitting: Orthopaedic Surgery

## 2024-02-18 ENCOUNTER — Other Ambulatory Visit: Payer: Self-pay

## 2024-02-18 DIAGNOSIS — G8929 Other chronic pain: Secondary | ICD-10-CM

## 2024-02-18 DIAGNOSIS — M25551 Pain in right hip: Secondary | ICD-10-CM

## 2024-02-20 ENCOUNTER — Ambulatory Visit
Admission: RE | Admit: 2024-02-20 | Discharge: 2024-02-20 | Disposition: A | Discharge status: Home / Self Care | Source: Ambulatory Visit | Attending: Orthopaedic Surgery | Admitting: Orthopaedic Surgery

## 2024-02-20 DIAGNOSIS — G8929 Other chronic pain: Secondary | ICD-10-CM

## 2024-02-20 DIAGNOSIS — M25551 Pain in right hip: Secondary | ICD-10-CM

## 2024-02-20 HISTORY — DX: Allergy, unspecified, initial encounter: T78.40XA

## 2024-03-03 DIAGNOSIS — M4807 Spinal stenosis, lumbosacral region: Secondary | ICD-10-CM | POA: Diagnosis not present

## 2024-03-03 DIAGNOSIS — M4727 Other spondylosis with radiculopathy, lumbosacral region: Secondary | ICD-10-CM | POA: Diagnosis not present

## 2024-03-03 DIAGNOSIS — M5117 Intervertebral disc disorders with radiculopathy, lumbosacral region: Secondary | ICD-10-CM | POA: Diagnosis not present

## 2024-03-07 ENCOUNTER — Ambulatory Visit: Admitting: Orthopaedic Surgery

## 2024-03-07 ENCOUNTER — Encounter: Payer: Self-pay | Admitting: Orthopaedic Surgery

## 2024-03-07 ENCOUNTER — Ambulatory Visit (INDEPENDENT_AMBULATORY_CARE_PROVIDER_SITE_OTHER): Admitting: Orthopaedic Surgery

## 2024-03-07 DIAGNOSIS — G8929 Other chronic pain: Secondary | ICD-10-CM | POA: Diagnosis not present

## 2024-03-07 DIAGNOSIS — M1611 Unilateral primary osteoarthritis, right hip: Secondary | ICD-10-CM | POA: Insufficient documentation

## 2024-03-07 DIAGNOSIS — M5441 Lumbago with sciatica, right side: Secondary | ICD-10-CM | POA: Diagnosis not present

## 2024-03-07 NOTE — Progress Notes (Signed)
 The patient is an active 73 year old female who comes in for follow-up after having MRI of her lumbar spine.  She also has well-documented arthritis in the right hip and a steroid injection in the right hip joint under ultrasound did help temporize her pain but she still having pain in the groin.  The pain is starting to wake her up at night.  She is still having low back pain to the right side but it seems to be only in the facet joint area.  She denies any radicular symptoms going down her leg and denies any numbness and tingling or weakness.  On exam her right hip has significant stiffness and pain with internal and external rotation.  The left hip moves smoothly and fluidly.  She does have pain palpation of the lower lumbar spine just to the right side and it seems to be facet joint mediated.  She has excellent strength in her bilateral lower extremities.  Again at this point her right hip pain is detriment affecting her mobility, her quality life and actives daily living.  She is feeling some weakness in that leg but I believe is more hip related than her back.  Previous x-rays were reviewed again of her pelvis and right hip and there are periarticular osteophytes around the right hip as well as joint space narrowing.  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  She does have moderate facet arthropathy of the lower lumbar spine as well.  We talked in length in detail about hip replacement surgery.  I do feel that a hip replacement would help her posture and decrease some of her back symptoms.  I talked about how the surgery is to perform and gave her hand about hip replacement surgery.  We discussed the risks and benefits of the surgery and what to expect from an intraoperative and postoperative standpoint.  While we set the surgery up I would still like to have her set up for right sided L4-L5 versus L5-S1 facet joint injections by Dr.  Eldonna because I think this will help her as well while we work on getting her scheduled for surgery for a right hip replacement.  She agrees with the treatment plan as well.  We will be in touch with scheduling surgery and the back injections.

## 2024-03-08 NOTE — Addendum Note (Signed)
 Addended by: Jessee Newnam B on: 03/08/2024 11:46 AM   Modules accepted: Orders

## 2024-03-11 ENCOUNTER — Telehealth: Payer: Self-pay

## 2024-03-11 NOTE — Telephone Encounter (Signed)
 I called patient to discuss scheduling right THA.  Left voice mail message for return call.

## 2024-03-14 ENCOUNTER — Ambulatory Visit: Admitting: Orthopaedic Surgery

## 2024-03-16 ENCOUNTER — Ambulatory Visit (INDEPENDENT_AMBULATORY_CARE_PROVIDER_SITE_OTHER): Admitting: Physical Medicine and Rehabilitation

## 2024-03-16 ENCOUNTER — Other Ambulatory Visit: Payer: Self-pay

## 2024-03-16 VITALS — BP 158/88 | HR 92

## 2024-03-16 DIAGNOSIS — M47816 Spondylosis without myelopathy or radiculopathy, lumbar region: Secondary | ICD-10-CM

## 2024-03-16 MED ORDER — METHYLPREDNISOLONE ACETATE 80 MG/ML IJ SUSP
40.0000 mg | Freq: Once | INTRAMUSCULAR | Status: AC
  Administered 2024-03-16: 40 mg

## 2024-03-16 NOTE — Progress Notes (Signed)
 Pain Scale   Average Pain 8 Patient advising she has lower back pain and it radiates around to the groin area on ht right side. Patient advising she has constant pain.        +Driver, -BT, -Dye Allergies.

## 2024-03-17 ENCOUNTER — Other Ambulatory Visit: Payer: Self-pay | Admitting: Physician Assistant

## 2024-03-17 DIAGNOSIS — Z01818 Encounter for other preprocedural examination: Secondary | ICD-10-CM

## 2024-03-22 ENCOUNTER — Encounter: Payer: Self-pay | Admitting: Family

## 2024-03-22 NOTE — Progress Notes (Signed)
 Deborah Mcconnell - 73 y.o. female MRN [id number]  Date of birth: 02/17/1951  Office Visit Note: Visit Date: 03/16/2024 PCP: Dineen Rollene MATSU, FNP Referred by: Dineen Rollene MATSU, FNP  Subjective: Chief Complaint  Patient presents with   Lower Back - Pain   HPI:  Deborah Mcconnell is a 73 y.o. female who comes in today at the request of Dr. Lonni Poli for planned Right  L4-5 and L5-S1 Lumbar facet/medial branch block with fluoroscopic guidance.  The patient has failed conservative care including home exercise, medications, time and activity modification.  This injection will be diagnostic and hopefully therapeutic.  Please see requesting physician notes for further details and justification.  Exam has shown concordant pain with facet joint loading.    ROS Otherwise per HPI.  Assessment & Plan: Visit Diagnoses:    ICD-10-CM   1. Spondylosis without myelopathy or radiculopathy, lumbar region  M47.816 XR C-ARM NO REPORT    Facet Injection    methylPREDNISolone  acetate (DEPO-MEDROL ) injection 40 mg      Plan: No additional findings.   Meds & Orders:  Meds ordered this encounter  Medications   methylPREDNISolone  acetate (DEPO-MEDROL ) injection 40 mg    Orders Placed This Encounter  Procedures   Facet Injection   XR C-ARM NO REPORT    Follow-up: Return for visit to requesting provider as needed.   Procedures: No procedures performed  Lumbar Facet Joint Intra-Articular Injection(s) with Fluoroscopic Guidance  Patient: Deborah Mcconnell      Date of Birth: [birth year] MRN: [id number] PCP: Dineen Rollene MATSU, FNP      Visit Date: 03/16/2024   Universal Protocol:    Date/Time: 03/16/2024  Consent Given By: the patient  Position: PRONE   Additional Comments: Vital signs were monitored before and after the procedure. Patient was prepped and draped in the usual sterile fashion. The correct patient, procedure, and site was verified.   Injection  Procedure Details:  Procedure Site One Meds Administered:  Meds ordered this encounter  Medications   methylPREDNISolone  acetate (DEPO-MEDROL ) injection 40 mg     Laterality: Right  Location/Site:  L4-L5 L5-S1  Needle size: 22 guage  Needle type: Spinal  Needle Placement: Articular  Findings:  -Comments: Excellent flow of contrast producing a partial arthrogram.  Procedure Details: The fluoroscope beam is vertically oriented in AP, and the inferior recess is visualized beneath the lower pole of the inferior apophyseal process, which represents the target point for needle insertion. When direct visualization is difficult the target point is located at the medial projection of the vertebral pedicle. The region overlying each aforementioned target is locally anesthetized with a 1 to 2 ml. volume of 1% Lidocaine  without Epinephrine .   The spinal needle was inserted into each of the above mentioned facet joints using biplanar fluoroscopic guidance. A 0.25 to 0.5 ml. volume of Isovue -250 was injected and a partial facet joint arthrogram was obtained. A single spot film was obtained of the resulting arthrogram.    One to 1.25 ml of the steroid/anesthetic solution was then injected into each of the facet joints noted above.   Additional Comments:  The patient tolerated the procedure well Dressing: 2 x 2 sterile gauze and Band-Aid    Post-procedure details: Patient was observed during the procedure. Post-procedure instructions were reviewed.  Patient left the clinic in stable condition.    Clinical History: MRI LUMBAR SPINE WITHOUT CONTRAST   TECHNIQUE: Multiplanar, multisequence MR imaging of the lumbar spine was performed. No  intravenous contrast was administered.   COMPARISON:  None Available.   FINDINGS: Segmentation: Standard.   Alignment:  Grade 1 anterolisthesis of L3 on L4.   Vertebrae: Degenerative endplate marrow changes at a few levels. No compression  fractures.   Conus medullaris and cauda equina: The conus medullaris terminates at the level of L1-L2. The distal spinal cord signal intensity is normal.   Paraspinal and other soft tissues: Renal cysts bilaterally. The visualized aorta is normal.   Disc levels:   L1-L2: Disc bulge. Mild bilateral facet arthropathy. Mild bilateral neuroforaminal stenosis. No spinal canal stenosis.   L2-L3: Disc bulge. Moderate bilateral facet arthropathy. Moderate right neuroforaminal stenosis. Mild spinal canal stenosis.   L3-L4: Disc bulge. Moderate bilateral facet arthropathy. Moderate bilateral neuroforaminal stenosis. Mild spinal canal stenosis.   L4-L5: Disc bulge. Moderate bilateral facet arthropathy. Moderate bilateral neuroforaminal stenosis. Mild spinal canal stenosis.   L5-S1: Disc bulge. Moderate bilateral facet arthropathy. Moderate bilateral neuroforaminal stenosis. Mild spinal canal stenosis.   IMPRESSION: 1. 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 2. Mild canal stenoses at L2-L3, L3-L4, L4-L5 and L5-S1.     Electronically Signed   By: Clem Savory M.D.   On: 03/03/2024 10:03     Objective:  VS:  HT:    WT:   BMI:     BP:(!) 158/88  HR:92bpm  TEMP: ( )  RESP:  Physical Exam Vitals and nursing note reviewed.  Constitutional:      General: She is not in acute distress.    Appearance: Normal appearance. She is not ill-appearing.  HENT:     Head: Normocephalic and atraumatic.     Right Ear: External ear normal.     Left Ear: External ear normal.  Eyes:     Extraocular Movements: Extraocular movements intact.  Cardiovascular:     Rate and Rhythm: Normal rate.     Pulses: Normal pulses.  Pulmonary:     Effort: Pulmonary effort is normal. No respiratory distress.  Abdominal:     General: There is no distension.     Palpations: Abdomen is soft.  Musculoskeletal:        General: Tenderness  present.     Cervical back: Neck supple.     Right lower leg: No edema.     Left lower leg: No edema.     Comments: Patient has good distal strength with no pain over the greater trochanters.  No clonus or focal weakness.  Skin:    Findings: No erythema, lesion or rash.  Neurological:     General: No focal deficit present.     Mental Status: She is alert and oriented to person, place, and time.     Sensory: No sensory deficit.     Motor: No weakness or abnormal muscle tone.     Coordination: Coordination normal.  Psychiatric:        Mood and Affect: Mood normal.        Behavior: Behavior normal.      Imaging: No results found.

## 2024-03-22 NOTE — Procedures (Signed)
 Lumbar Facet Joint Intra-Articular Injection(s) with Fluoroscopic Guidance  Patient: Deborah Mcconnell      Date of Birth: 10/12/1950 MRN: [id number] PCP: Dineen Rollene MATSU, FNP      Visit Date: 03/16/2024   Universal Protocol:    Date/Time: 03/16/2024  Consent Given By: the patient  Position: PRONE   Additional Comments: Vital signs were monitored before and after the procedure. Patient was prepped and draped in the usual sterile fashion. The correct patient, procedure, and site was verified.   Injection Procedure Details:  Procedure Site One Meds Administered:  Meds ordered this encounter  Medications   methylPREDNISolone  acetate (DEPO-MEDROL ) injection 40 mg     Laterality: Right  Location/Site:  L4-L5 L5-S1  Needle size: 22 guage  Needle type: Spinal  Needle Placement: Articular  Findings:  -Comments: Excellent flow of contrast producing a partial arthrogram.  Procedure Details: The fluoroscope beam is vertically oriented in AP, and the inferior recess is visualized beneath the lower pole of the inferior apophyseal process, which represents the target point for needle insertion. When direct visualization is difficult the target point is located at the medial projection of the vertebral pedicle. The region overlying each aforementioned target is locally anesthetized with a 1 to 2 ml. volume of 1% Lidocaine  without Epinephrine .   The spinal needle was inserted into each of the above mentioned facet joints using biplanar fluoroscopic guidance. A 0.25 to 0.5 ml. volume of Isovue -250 was injected and a partial facet joint arthrogram was obtained. A single spot film was obtained of the resulting arthrogram.    One to 1.25 ml of the steroid/anesthetic solution was then injected into each of the facet joints noted above.   Additional Comments:  The patient tolerated the procedure well Dressing: 2 x 2 sterile gauze and Band-Aid    Post-procedure details: Patient  was observed during the procedure. Post-procedure instructions were reviewed.  Patient left the clinic in stable condition.

## 2024-03-30 ENCOUNTER — Telehealth: Payer: Self-pay | Admitting: Orthopaedic Surgery

## 2024-03-30 NOTE — Telephone Encounter (Signed)
 Patient called and ask if she could get an letter with her name on it stating that she is having surgery and on what date. In order to change her flight she already had booked. They needed proof. If you could send it email or she said she will come pick it up. RA#[phone]

## 2024-03-31 NOTE — Telephone Encounter (Signed)
 DONE

## 2024-04-04 NOTE — Patient Instructions (Signed)
 SURGICAL WAITING ROOM VISITATION Patients having surgery or a procedure may have no more than 2 support people in the waiting area - these visitors may rotate in the visitor waiting room.   Due to an increase in RSV and influenza rates and associated hospitalizations, children ages 83 and under may not visit patients in [facility] hospitals. If the patient needs to stay at the hospital during part of their recovery, the visitor guidelines for inpatient rooms apply.  PRE-OP VISITATION  Pre-op nurse will coordinate an appropriate time for 1 support person to accompany the patient in pre-op.  This support person may not rotate.  This visitor will be contacted when the time is appropriate for the visitor to come back in the pre-op area.  Please refer to the [facility] website for the visitor guidelines for Inpatients (after your surgery is over and you are in a regular room).  You are not required to quarantine at this time prior to your surgery. However, you must do this: Hand Hygiene often Do NOT share personal items Notify your provider if you are in close contact with someone who has COVID or you develop fever 100.4 or greater, new onset of sneezing, cough, sore throat, shortness of breath or body aches.  If you test positive for Covid or have been in contact with anyone that has tested positive in the last 10 days please notify you surgeon.    Your procedure is scheduled on: 04/15/24   Report to [facility] Main Entrance: [place] entrance where the [employer] is available.   Report to admitting at:10:00 AM  Call this number if you have any questions or problems the morning of surgery [phone]  FOLLOW ANY ADDITIONAL PRE OP INSTRUCTIONS YOU RECEIVED FROM YOUR SURGEON'S OFFICE!!!  Do not eat food after Midnight the night prior to your surgery/procedure.  After Midnight you may have the following liquids until : 9:30 AM DAY OF SURGERY  Clear Liquid Diet Water  Black  Coffee (sugar ok, NO MILK/CREAM OR CREAMERS)  Tea (sugar ok, NO MILK/CREAM OR CREAMERS) regular and decaf                             Plain Jell-O  with no fruit (NO RED)                                           Fruit ices (not with fruit pulp, NO RED)                                     Popsicles (NO RED)                                                                  Juice: NO CITRUS JUICES: only apple, WHITE grape, WHITE cranberry Sports drinks like Gatorade or Powerade (NO RED)   The day of surgery:  Drink ONE (1) Pre-Surgery Clear Ensure at : 9:30 AM the morning of surgery. Drink in one sitting. Do not sip.  This drink was given  to you during your hospital pre-op appointment visit. Nothing else to drink after completing the Pre-Surgery Clear Ensure or G2 : No candy, chewing gum or throat lozenges.    Oral Hygiene is also important to reduce your risk of infection.        Remember - BRUSH YOUR TEETH THE MORNING OF SURGERY WITH YOUR REGULAR TOOTHPASTE  Do NOT smoke after Midnight the night before surgery.  STOP TAKING all Vitamins, Herbs and supplements 1 week before your surgery.   Take ONLY these medicines the morning of surgery with A SIP OF WATER : NONE.  If You have been diagnosed with Sleep Apnea - Bring CPAP mask and tubing day of surgery. We will provide you with a CPAP machine on the day of your surgery.                   You may not have any metal on your body including hair pins, jewelry, and body piercing  Do not wear make-up, lotions, powders, perfumes / cologne, or deodorant  Do not wear nail polish including gel and S&S, artificial / acrylic nails, or any other type of covering on natural nails including finger and toenails. If you have artificial nails, gel coating, etc., that needs to be removed by a nail salon, Please have this removed prior to surgery. Not doing so may mean that your surgery could be cancelled or delayed if the Surgeon or anesthesia staff feels like  they are unable to monitor you safely.   Do not shave 48 hours prior to surgery to avoid nicks in your skin which may contribute to postoperative infections.   Contacts, Hearing Aids, dentures or bridgework may not be worn into surgery. DENTURES WILL BE REMOVED PRIOR TO SURGERY PLEASE DO NOT APPLY Poly grip OR ADHESIVES!!!  You may bring a small overnight bag with you on the day of surgery, only pack items that are not valuable. [facility] IS NOT RESPONSIBLE   FOR VALUABLES THAT ARE LOST OR STOLEN.   Patients discharged on the day of surgery will not be allowed to drive home.  Someone NEEDS to stay with you for the first 24 hours after anesthesia.  Do not bring your home medications to the hospital. The Pharmacy will dispense medications listed on your medication list to you during your admission in the Hospital.  Special Instructions: Bring a copy of your healthcare power of attorney and living will documents the day of surgery, if you wish to have them scanned into your [facility] Records- EPIC  Please read over the following fact sheets you were given: IF YOU HAVE QUESTIONS ABOUT YOUR PRE-OP INSTRUCTIONS, PLEASE CALL [phone]  PATIENT SIGNATURE_________________________________  NURSE SIGNATURE__________________________________  ________________________________________________________________________  Pre-operative 4 CHG Bath Instructions  DYNA-Hex 4 Chlorhexidine  Gluconate 4% Solution Antiseptic 4 fl. oz   You can play a key role in reducing the risk of infection after surgery. Your skin needs to be as free of germs as possible. You can reduce the number of germs on your skin by washing with CHG (chlorhexidine  gluconate) soap before surgery. CHG is an antiseptic soap that kills germs and continues to kill germs even after washing.   DO NOT use if you have an allergy to chlorhexidine /CHG or antibacterial soaps. If your skin becomes reddened or irritated, stop using the  CHG and notify one of our RNs at   Please shower with the CHG soap starting 4 days before surgery using the following schedule:  Please keep in mind the following:  DO NOT shave, including legs and underarms, starting the day of your first shower.   You may shave your face at any point before/day of surgery.  Place clean sheets on your bed the day you start using CHG soap. Use a clean washcloth (not used since being washed) for each shower. DO NOT sleep with pets once you start using the CHG.  CHG Shower Instructions:  If you choose to wash your hair and private area, wash first with your normal shampoo/soap.  After you use shampoo/soap, rinse your hair and body thoroughly to remove shampoo/soap residue.  Turn the water  OFF and apply about 3 tablespoons (45 ml) of CHG soap to a CLEAN washcloth.  Apply CHG soap ONLY FROM YOUR NECK DOWN TO YOUR TOES (washing for 3-5 minutes)  DO NOT use CHG soap on face, private areas, open wounds, or sores.  Pay special attention to the area where your surgery is being performed.  If you are having back surgery, having someone wash your back for you may be helpful. Wait 2 minutes after CHG soap is applied, then you may rinse off the CHG soap.  Pat dry with a clean towel  Put on clean clothes/pajamas   If you choose to wear lotion, please use ONLY the CHG-compatible lotions on the back of this paper.     Additional instructions for the day of surgery: DO NOT APPLY any lotions, deodorants, cologne, or perfumes.   Put on clean/comfortable clothes.  Brush your teeth.  Ask your nurse before applying any prescription medications to the skin.   CHG Compatible Lotions   Aveeno Moisturizing lotion  Cetaphil Moisturizing Cream  Cetaphil Moisturizing Lotion  Clairol Herbal Essence Moisturizing Lotion, Dry Skin  Clairol Herbal Essence Moisturizing Lotion, Extra Dry Skin  Clairol Herbal Essence Moisturizing Lotion, Normal Skin  Curel Age Defying  Therapeutic Moisturizing Lotion with Alpha Hydroxy  Curel Extreme Care Body Lotion  Curel Soothing Hands Moisturizing Hand Lotion  Curel Therapeutic Moisturizing Cream, Fragrance-Free  Curel Therapeutic Moisturizing Lotion, Fragrance-Free  Curel Therapeutic Moisturizing Lotion, Original Formula  Eucerin Daily Replenishing Lotion  Eucerin Dry Skin Therapy Plus Alpha Hydroxy Crme  Eucerin Dry Skin Therapy Plus Alpha Hydroxy Lotion  Eucerin Original Crme  Eucerin Original Lotion  Eucerin Plus Crme Eucerin Plus Lotion  Eucerin TriLipid Replenishing Lotion  Keri Anti-Bacterial Hand Lotion  Keri Deep Conditioning Original Lotion Dry Skin Formula Softly Scented  Keri Deep Conditioning Original Lotion, Fragrance Free Sensitive Skin Formula  Keri Lotion Fast Absorbing Fragrance Free Sensitive Skin Formula  Keri Lotion Fast Absorbing Softly Scented Dry Skin Formula  Keri Original Lotion  Keri Skin Renewal Lotion Keri Silky Smooth Lotion  Keri Silky Smooth Sensitive Skin Lotion  Nivea Body Creamy Conditioning Oil  Nivea Body Extra Enriched Lotion  Nivea Body Original Lotion  Nivea Body Sheer Moisturizing Lotion Nivea Crme  Nivea Skin Firming Lotion  NutraDerm 30 Skin Lotion  NutraDerm Skin Lotion  NutraDerm Therapeutic Skin Cream  NutraDerm Therapeutic Skin Lotion  ProShield Protective Hand Cream  Provon moisturizing lotion  Incentive Spirometer  An incentive spirometer is a tool that can help keep your lungs clear and active. This tool measures how well you are filling your lungs with each breath. Taking long deep breaths may help reverse or decrease the chance of developing breathing (pulmonary) problems (especially infection) following: A long period of time when you are unable to move or be active. BEFORE THE PROCEDURE  If  the spirometer includes an indicator to show your best effort, your nurse or respiratory therapist will set it to a desired goal. If possible, sit up straight  or lean slightly forward. Try not to slouch. Hold the incentive spirometer in an upright position. INSTRUCTIONS FOR USE  Sit on the edge of your bed if possible, or sit up as far as you can in bed or on a chair. Hold the incentive spirometer in an upright position. Breathe out normally. Place the mouthpiece in your mouth and seal your lips tightly around it. Breathe in slowly and as deeply as possible, raising the piston or the ball toward the top of the column. Hold your breath for 3-5 seconds or for as long as possible. Allow the piston or ball to fall to the bottom of the column. Remove the mouthpiece from your mouth and breathe out normally. Rest for a few seconds and repeat Steps 1 through 7 at least 10 times every 1-2 hours when you are awake. Take your time and take a few normal breaths between deep breaths. The spirometer may include an indicator to show your best effort. Use the indicator as a goal to work toward during each repetition. After each set of 10 deep breaths, practice coughing to be sure your lungs are clear. If you have an incision (the cut made at the time of surgery), support your incision when coughing by placing a pillow or rolled up towels firmly against it. Once you are able to get out of bed, walk around indoors and cough well. You may stop using the incentive spirometer when instructed by your caregiver.  RISKS AND COMPLICATIONS Take your time so you do not get dizzy or light-headed. If you are in pain, you may need to take or ask for pain medication before doing incentive spirometry. It is harder to take a deep breath if you are having pain. AFTER USE Rest and breathe slowly and easily. It can be helpful to keep track of a log of your progress. Your caregiver can provide you with a simple table to help with this. If you are using the spirometer at home, follow these instructions: SEEK MEDICAL CARE IF:  You are having difficultly using the spirometer. You have  trouble using the spirometer as often as instructed. Your pain medication is not giving enough relief while using the spirometer. You develop fever of 100.5 F (38.1 C) or higher. SEEK IMMEDIATE MEDICAL CARE IF:  You cough up bloody sputum that had not been present before. You develop fever of 102 F (38.9 C) or greater. You develop worsening pain at or near the incision site. MAKE SURE YOU:  Understand these instructions. Will watch your condition. Will get help right away if you are not doing well or get worse. Document Released: 10/27/2006 Document Revised: 09/08/2011 Document Reviewed: 12/28/2006 [facility] Patient Information 2014 [place], [place].   ________________________________________________________________________

## 2024-04-06 ENCOUNTER — Encounter (HOSPITAL_COMMUNITY)
Admission: RE | Admit: 2024-04-06 | Discharge: 2024-04-06 | Disposition: A | Discharge status: Home / Self Care | Source: Ambulatory Visit | Attending: Orthopaedic Surgery | Admitting: Orthopaedic Surgery

## 2024-04-06 ENCOUNTER — Encounter: Payer: Self-pay | Admitting: Family

## 2024-04-06 ENCOUNTER — Other Ambulatory Visit: Payer: Self-pay

## 2024-04-06 ENCOUNTER — Encounter (HOSPITAL_COMMUNITY): Payer: Self-pay

## 2024-04-06 VITALS — BP 125/74 | HR 72 | Temp 98.2°F | Ht 71.0 in | Wt 175.0 lb

## 2024-04-06 DIAGNOSIS — R002 Palpitations: Secondary | ICD-10-CM | POA: Diagnosis not present

## 2024-04-06 DIAGNOSIS — Z01810 Encounter for preprocedural cardiovascular examination: Secondary | ICD-10-CM | POA: Diagnosis present

## 2024-04-06 DIAGNOSIS — Z01818 Encounter for other preprocedural examination: Secondary | ICD-10-CM | POA: Diagnosis not present

## 2024-04-06 DIAGNOSIS — Z01812 Encounter for preprocedural laboratory examination: Secondary | ICD-10-CM | POA: Diagnosis present

## 2024-04-06 HISTORY — DX: Palpitations: R00.2

## 2024-04-06 HISTORY — DX: Pneumonia, unspecified organism: J18.9

## 2024-04-06 LAB — SURGICAL PCR SCREEN
MRSA, PCR: NEGATIVE
Staphylococcus aureus: NEGATIVE

## 2024-04-06 LAB — CBC
HCT: 45.1 % (ref 36.0–46.0)
Hemoglobin: 14.6 g/dL (ref 12.0–15.0)
MCH: 31.4 pg (ref 26.0–34.0)
MCHC: 32.4 g/dL (ref 30.0–36.0)
MCV: 97 fL (ref 80.0–100.0)
Platelets: 242 K/uL (ref 150–400)
RBC: 4.65 MIL/uL (ref 3.87–5.11)
RDW: 12.3 % (ref 11.5–15.5)
WBC: 7.5 K/uL (ref 4.0–10.5)
nRBC: 0 % (ref 0.0–0.2)

## 2024-04-06 LAB — BASIC METABOLIC PANEL WITH GFR
Anion gap: 10 (ref 5–15)
BUN: 25 mg/dL — ABNORMAL HIGH (ref 8–23)
CO2: 27 mmol/L (ref 22–32)
Calcium: 10 mg/dL (ref 8.9–10.3)
Chloride: 105 mmol/L (ref 98–111)
Creatinine, Ser: 0.68 mg/dL (ref 0.44–1.00)
GFR, Estimated: >60 mL/min (ref >60)
Glucose, Bld: 95 mg/dL (ref 70–99)
Potassium: 4.4 mmol/L (ref 3.5–5.1)
Sodium: 141 mmol/L (ref 135–145)

## 2024-04-06 NOTE — Progress Notes (Addendum)
 For Anesthesia: PCP - Dineen Rollene MATSU, FNP  Cardiologist - Darron Deatrice LABOR, MD . LOV: 12/23/22  Bowel Prep reminder:N/A  Chest x-ray - CT cardiac: 11/03/22 EKG - 04/06/24 Stress Test -  ECHO -  Cardiac Cath -  Pacemaker/ICD device last checked: Pacemaker orders received: Device Rep notified:  Spinal Cord Stimulator:N/A  Sleep Study - N/A CPAP -   Fasting Blood Sugar - N/A Checks Blood Sugar _____ times a day Date and result of last Hgb A1c-  Last dose of GLP1 agonist- N/A GLP1 instructions: Hold 7 days prior to schedule (Hold 24 hours-daily)   Last dose of SGLT-2 inhibitors- N/A SGLT-2 instructions: Hold 72 hours prior to surgery  Blood Thinner Instructions:N/A Last Dose: Time last taken:  Aspirin Instructions:N/A Last Dose: Time last taken:  Activity level: Can go up a flight of stairs and activities of daily living without stopping and without chest pain and/or shortness of breath   Able to exercise without chest pain and/or shortness of breath  Anesthesia review:Hx: Palpitations.   Patient denies shortness of breath, fever, cough and chest pain at PAT appointment   Patient verbalized understanding of instructions that were reviewed over the telephone.

## 2024-04-07 NOTE — Telephone Encounter (Signed)
**Note De-identified  [place] Obfuscation** Please advise 

## 2024-04-14 ENCOUNTER — Telehealth: Payer: Self-pay | Admitting: *Deleted

## 2024-04-14 NOTE — H&P (Signed)
 TOTAL HIP ADMISSION H&P  Patient is admitted for right total hip arthroplasty.  Subjective:  Chief Complaint: right hip pain  HPI: Deborah Mcconnell, 73 y.o. female, has a history of pain and functional disability in the right hip(s) due to arthritis and patient has failed non-surgical conservative treatments for greater than 12 weeks to include NSAID's and/or analgesics, corticosteriod injections, use of assistive devices, weight reduction as appropriate, and activity modification.  Onset of symptoms was gradual starting a few years ago with gradually worsening course since that time.The patient noted no past surgery on the right hip(s).  Patient currently rates pain in the right hip at 10 out of 10 with activity. Patient has night pain, worsening of pain with activity and weight bearing, trendelenberg gait, pain that interfers with activities of daily living, and pain with passive range of motion. Patient has evidence of subchondral sclerosis, periarticular osteophytes, and joint space narrowing by imaging studies. This condition presents safety issues increasing the risk of falls.  There is no current active infection.  Patient Active Problem List   Diagnosis Date Noted   Unilateral primary osteoarthritis, right hip 03/07/2024   Muscle spasm 09/23/2023   Liver cyst 10/17/2022   Atherosclerosis of aorta 10/17/2022   Bilateral knee pain 10/16/2021   Atrophy of cervix 10/16/2021   Encounter for long-term (current) aspirin use 04/17/2021   Pain, dental 01/21/2021   Neck pain on right side 01/09/2021   Nasal congestion 11/05/2020   COVID 11/05/2020   Onychomycosis 10/11/2020   Abdominal bloating 03/02/2020   Right wrist pain 08/27/2018   Vitamin D  deficiency 08/27/2018   Paroxysmal tachycardia (HCC) 12/28/2017   Palpitations 09/11/2017   Bronchitis 09/11/2017   Dysuria 02/03/2017   Right ear pain 02/03/2017   Right hip pain 07/29/2016   Osteoarthritis 04/02/2016   Screening for  cervical cancer 10/08/2015   Pelvic pain in female 09/10/2015   Midline thoracic back pain 08/15/2015   Microscopic hematuria 05/08/2015   Vertigo 10/27/2014   Malignant neoplasm of breast (female) (HCC) 11/19/2012   Routine physical examination 10/27/2012   Screening for colon cancer 10/27/2012   Lumbar pain 06/04/2012   Hyperlipidemia 02/17/2011   Osteopenia 02/17/2011   Past Medical History:  Diagnosis Date   Allergy    Arthritis    osteoarthritis   Breast cancer, left (HCC) 2014   Hx Lumpectomy and Rad tx's.   Heart palpitations    Hyperlipidemia    Osteopenia    previously taking Evista and Actonel   Osteoporosis    Palpitations    Pneumonia    PONV (postoperative nausea and vomiting)    Vertigo     Past Surgical History:  Procedure Laterality Date   BREAST SURGERY  June, 2014   CHOLECYSTECTOMY     COLONOSCOPY     CYSTOSCOPY W/ RETROGRADES Bilateral 04/28/2022   Procedure: CYSTOSCOPY WITH RETROGRADE PYELOGRAM;  Surgeon: Penne Knee, MD;  Location: [facility] ORS;  Service: Urology;  Laterality: Bilateral;   CYSTOSCOPY WITH BIOPSY N/A 04/28/2022   Procedure: CYSTOSCOPY WITH BLADDER BIOPSY;  Surgeon: Penne Knee, MD;  Location: [facility] OR[facility];  Service: Urology;  Laterality: N/A;   EYE SURGERY Bilateral 2022   cataract   lt breast lumpectomy  2014    No current facility-administered medications for this encounter.   Current Outpatient Medications  Medication Sig Dispense Refill Last Dose/Taking   B Complex Vitamins (VITAMIN B COMPLEX PO) Take 1 capsule by mouth in the morning.   Taking   Calcium Citrate-Vitamin  D (CALCIUM CITRATE + D3 PO) Take 1 tablet by mouth in the morning.   Taking   cholecalciferol (VITAMIN D3) 25 MCG (1000 UNIT) tablet Take 1,000 Units by mouth in the morning.   Taking   CRANBERRY PO Take 1 tablet by mouth in the morning.   Taking   diclofenac  sodium (VOLTAREN ) 1 % GEL Apply 4 g topically 4 (four) times daily. (Patient taking differently: Apply  4 g topically 4 (four) times daily as needed (pain.).) 1 Tube 3 Taking Differently   Menthol-Methyl Salicylate (SALONPAS PAIN RELIEF PATCH EX) Place 1 patch onto the skin daily as needed (pain.).   Taking As Needed   naproxen (NAPROSYN) 500 MG tablet Take 500 mg by mouth 2 (two) times daily as needed (pain.).   Taking As Needed   Probiotic Product (PROBIOTIC PO) Take 1 capsule by mouth in the morning.   Taking   simvastatin  (ZOCOR ) 40 MG tablet TAKE 1 TABLET BY MOUTH AT BEDTIME 90 tablet 2 Taking   Ascorbic Acid (VITAMIN C) 1000 MG tablet Take 1,000 mg by mouth daily.      Allergies  Allergen Reactions   Ciprofloxacin  Anaphylaxis    Causes death   Ofloxacin Anaphylaxis    Causes Death Other reaction(s): Not available   Quinolones Anaphylaxis   Actonel [Risedronate]     Stomach pain   Augmentin  [Amoxicillin -Pot Clavulanate] Nausea And Vomiting   Other Other (See Comments)   Bactrim  [Sulfamethoxazole -Trimethoprim ] Hives    Itching, hives, and GI distress    Social History   Tobacco Use   Smoking status: Never   Smokeless tobacco: Never  Substance Use Topics   Alcohol use: Not Currently    Comment: rare    Family History  Problem Relation Age of Onset   Osteoporosis Mother    Hypertension Mother    Osteoarthritis Mother    Arthritis Mother    COPD Father    Osteoporosis Sister    Osteoarthritis Sister    Osteoarthritis Maternal Grandmother    Diabetes Maternal Grandfather    Cancer Paternal Grandfather        unknown   Cancer Maternal Aunt    Diabetes Maternal Aunt    Cancer Maternal Aunt    Diabetes Maternal Aunt    Prostate cancer Neg Hx    Bladder Cancer Neg Hx    Kidney cancer Neg Hx    CAD Neg Hx    CVA Neg Hx      Review of Systems  Objective:  Physical Exam Vitals reviewed.  Constitutional:      Appearance: Normal appearance.  HENT:     Head: Normocephalic and atraumatic.  Eyes:     Extraocular Movements: Extraocular movements intact.     Pupils:  Pupils are equal, round, and reactive to light.  Cardiovascular:     Rate and Rhythm: Normal rate and regular rhythm.  Pulmonary:     Effort: Pulmonary effort is normal.     Breath sounds: Normal breath sounds.  Abdominal:     Palpations: Abdomen is soft.  Musculoskeletal:     Cervical back: Normal range of motion and neck supple.     Right hip: Tenderness and bony tenderness present. Decreased range of motion. Decreased strength.  Neurological:     Mental Status: She is alert and oriented to person, place, and time.  Psychiatric:        Behavior: Behavior normal.     Vital signs in last 24 hours:    Labs:  Estimated body mass index is 24.41 kg/m as calculated from the following:   Height as of 04/06/24: 5' 11 (1.803 m).   Weight as of 04/06/24: 79.4 kg.   Imaging Review Plain radiographs demonstrate severe degenerative joint disease of the right hip(s). The bone quality appears to be good for age and reported activity level.      Assessment/Plan:  End stage arthritis, right hip(s)  The patient history, physical examination, clinical judgement of the provider and imaging studies are consistent with end stage degenerative joint disease of the right hip(s) and total hip arthroplasty is deemed medically necessary. The treatment options including medical management, injection therapy, arthroscopy and arthroplasty were discussed at length. The risks and benefits of total hip arthroplasty were presented and reviewed. The risks due to aseptic loosening, infection, stiffness, dislocation/subluxation,  thromboembolic complications and other imponderables were discussed.  The patient acknowledged the explanation, agreed to proceed with the plan and consent was signed. Patient is being admitted for inpatient treatment for surgery, pain control, PT, OT, prophylactic antibiotics, VTE prophylaxis, progressive ambulation and ADL's and discharge planning.The patient is planning to be  discharged home with home health services

## 2024-04-14 NOTE — Care Plan (Signed)
 [facility] RNCM call to patient to discuss her upcoming Right total hip replacement with Dr. Vernetta on 04/15/24 at [facility]. She is agreeable to case management. She lives with her spouse, who will be able to assist her at home after discharge. She has a RW already. Anticipate HHPT will be needed after a short hospital stay. Referral made to [facility] after choice provided. Reviewed all post op care instructions. Will continue to follow for needs.

## 2024-04-14 NOTE — Telephone Encounter (Signed)
 [facility] pre op call completed for upcoming Right total hip replacement.

## 2024-04-15 ENCOUNTER — Encounter (HOSPITAL_COMMUNITY): Payer: Self-pay | Admitting: Orthopaedic Surgery

## 2024-04-15 ENCOUNTER — Other Ambulatory Visit: Payer: Self-pay

## 2024-04-15 ENCOUNTER — Ambulatory Visit (HOSPITAL_COMMUNITY): Payer: Self-pay

## 2024-04-15 ENCOUNTER — Encounter (HOSPITAL_COMMUNITY)
Admission: RE | Disposition: A | Discharge status: Home Health | Payer: Self-pay | Source: Home / Self Care | Attending: Orthopaedic Surgery

## 2024-04-15 ENCOUNTER — Ambulatory Visit (HOSPITAL_COMMUNITY): Payer: Self-pay | Admitting: Medical

## 2024-04-15 ENCOUNTER — Ambulatory Visit (HOSPITAL_COMMUNITY)

## 2024-04-15 ENCOUNTER — Observation Stay (HOSPITAL_COMMUNITY)
Admission: RE | Admit: 2024-04-15 | Discharge: 2024-04-17 | DRG: 470 | Disposition: A | Attending: Orthopaedic Surgery | Admitting: Orthopaedic Surgery

## 2024-04-15 ENCOUNTER — Observation Stay (HOSPITAL_COMMUNITY)

## 2024-04-15 DIAGNOSIS — E785 Hyperlipidemia, unspecified: Secondary | ICD-10-CM | POA: Diagnosis present

## 2024-04-15 DIAGNOSIS — M1611 Unilateral primary osteoarthritis, right hip: Principal | ICD-10-CM | POA: Diagnosis present

## 2024-04-15 DIAGNOSIS — M810 Age-related osteoporosis without current pathological fracture: Secondary | ICD-10-CM | POA: Diagnosis present

## 2024-04-15 DIAGNOSIS — Z8616 Personal history of COVID-19: Secondary | ICD-10-CM

## 2024-04-15 DIAGNOSIS — Z8249 Family history of ischemic heart disease and other diseases of the circulatory system: Secondary | ICD-10-CM

## 2024-04-15 DIAGNOSIS — Z7982 Long term (current) use of aspirin: Secondary | ICD-10-CM

## 2024-04-15 DIAGNOSIS — Z853 Personal history of malignant neoplasm of breast: Secondary | ICD-10-CM | POA: Diagnosis not present

## 2024-04-15 DIAGNOSIS — Z471 Aftercare following joint replacement surgery: Secondary | ICD-10-CM | POA: Diagnosis not present

## 2024-04-15 DIAGNOSIS — Z833 Family history of diabetes mellitus: Secondary | ICD-10-CM

## 2024-04-15 DIAGNOSIS — Z79899 Other long term (current) drug therapy: Secondary | ICD-10-CM | POA: Diagnosis not present

## 2024-04-15 DIAGNOSIS — Z96641 Presence of right artificial hip joint: Secondary | ICD-10-CM

## 2024-04-15 DIAGNOSIS — Z23 Encounter for immunization: Secondary | ICD-10-CM | POA: Diagnosis not present

## 2024-04-15 HISTORY — PX: TOTAL HIP ARTHROPLASTY: SHX124

## 2024-04-15 LAB — TYPE AND SCREEN
ABO/RH(D): O POS
Antibody Screen: NEGATIVE

## 2024-04-15 LAB — ABO/RH: ABO/RH(D): O POS

## 2024-04-15 SURGERY — ARTHROPLASTY, HIP, TOTAL, ANTERIOR APPROACH
Anesthesia: Spinal | Site: Hip | Laterality: Right

## 2024-04-15 MED ORDER — ONDANSETRON HCL 4 MG/2ML IJ SOLN: 4.0000 mg | Freq: Four times a day (QID) | INTRAMUSCULAR | Status: DC | PRN

## 2024-04-15 MED ORDER — PROPOFOL 500 MG/50ML IV EMUL
INTRAVENOUS | Status: DC | PRN
  Administered 2024-04-15: 50 ug/kg/min via INTRAVENOUS
  Administered 2024-04-15: 20 mg via INTRAVENOUS

## 2024-04-15 MED ORDER — LACTATED RINGERS IV SOLN: INTRAVENOUS | Status: DC

## 2024-04-15 MED ORDER — POVIDONE-IODINE 10 % EX SWAB: 2.0000 | Freq: Once | CUTANEOUS | Status: DC

## 2024-04-15 MED ORDER — ACETAMINOPHEN 500 MG PO TABS
1000.0000 mg | ORAL_TABLET | Freq: Once | ORAL | Status: AC
  Administered 2024-04-15: 1000 mg via ORAL
  Filled 2024-04-15: qty 2

## 2024-04-15 MED ORDER — ONDANSETRON HCL 4 MG PO TABS
4.0000 mg | ORAL_TABLET | Freq: Four times a day (QID) | ORAL | Status: DC | PRN
  Administered 2024-04-15 – 2024-04-16 (×3): 4 mg via ORAL
  Filled 2024-04-15 (×3): qty 1

## 2024-04-15 MED ORDER — SODIUM CHLORIDE 0.9 % IR SOLN
Status: DC | PRN
  Administered 2024-04-15: 1000 mL

## 2024-04-15 MED ORDER — INFLUENZA VAC SPLIT HIGH-DOSE 0.5 ML IM SUSY
0.5000 mL | PREFILLED_SYRINGE | INTRAMUSCULAR | Status: AC
  Administered 2024-04-17: 0.5 mL via INTRAMUSCULAR
  Filled 2024-04-15 (×2): qty 0.5

## 2024-04-15 MED ORDER — PROPOFOL 1000 MG/100ML IV EMUL
INTRAVENOUS | Status: AC
  Filled 2024-04-15: qty 100

## 2024-04-15 MED ORDER — ACETAMINOPHEN 325 MG PO TABS: 325.0000 mg | ORAL_TABLET | Freq: Four times a day (QID) | ORAL | Status: DC | PRN

## 2024-04-15 MED ORDER — SODIUM CHLORIDE 0.9 % IV SOLN: INTRAVENOUS | Status: DC

## 2024-04-15 MED ORDER — CEFAZOLIN SODIUM-DEXTROSE 2-4 GM/100ML-% IV SOLN
2.0000 g | Freq: Four times a day (QID) | INTRAVENOUS | Status: AC
  Administered 2024-04-15 (×2): 2 g via INTRAVENOUS
  Filled 2024-04-15 (×2): qty 100

## 2024-04-15 MED ORDER — ONDANSETRON HCL 4 MG/2ML IJ SOLN: 4.0000 mg | Freq: Once | INTRAMUSCULAR | Status: DC | PRN

## 2024-04-15 MED ORDER — DIPHENHYDRAMINE HCL 12.5 MG/5ML PO ELIX: 12.5000 mg | ORAL_SOLUTION | ORAL | Status: DC | PRN

## 2024-04-15 MED ORDER — OXYCODONE HCL 5 MG PO TABS: 5.0000 mg | ORAL_TABLET | Freq: Once | ORAL | Status: DC | PRN

## 2024-04-15 MED ORDER — PHENOL 1.4 % MT LIQD: 1.0000 | OROMUCOSAL | Status: DC | PRN

## 2024-04-15 MED ORDER — OXYCODONE HCL 5 MG/5ML PO SOLN: 5.0000 mg | Freq: Once | ORAL | Status: DC | PRN

## 2024-04-15 MED ORDER — METOCLOPRAMIDE HCL 5 MG PO TABS: 5.0000 mg | ORAL_TABLET | Freq: Three times a day (TID) | ORAL | Status: DC | PRN

## 2024-04-15 MED ORDER — MORPHINE SULFATE (PF) 2 MG/ML IV SOLN: 0.5000 mg | INTRAVENOUS | Status: DC | PRN

## 2024-04-15 MED ORDER — PANTOPRAZOLE SODIUM 40 MG PO TBEC
40.0000 mg | DELAYED_RELEASE_TABLET | Freq: Every day | ORAL | Status: DC
  Administered 2024-04-15 – 2024-04-17 (×3): 40 mg via ORAL
  Filled 2024-04-15 (×3): qty 1

## 2024-04-15 MED ORDER — STERILE WATER FOR IRRIGATION IR SOLN
Status: DC | PRN
  Administered 2024-04-15: 2000 mL

## 2024-04-15 MED ORDER — ASPIRIN 81 MG PO CHEW
81.0000 mg | CHEWABLE_TABLET | Freq: Two times a day (BID) | ORAL | Status: DC
  Administered 2024-04-15 – 2024-04-17 (×4): 81 mg via ORAL
  Filled 2024-04-15 (×4): qty 1

## 2024-04-15 MED ORDER — FENTANYL CITRATE (PF) 50 MCG/ML IJ SOSY
25.0000 ug | PREFILLED_SYRINGE | INTRAMUSCULAR | Status: DC | PRN
  Administered 2024-04-15: 50 ug via INTRAVENOUS

## 2024-04-15 MED ORDER — MIDAZOLAM HCL 2 MG/2ML IJ SOLN
INTRAMUSCULAR | Status: AC
  Filled 2024-04-15: qty 2

## 2024-04-15 MED ORDER — EPHEDRINE 5 MG/ML INJ
INTRAVENOUS | Status: AC
  Filled 2024-04-15: qty 5

## 2024-04-15 MED ORDER — BUPIVACAINE IN DEXTROSE 0.75-8.25 % IT SOLN
INTRATHECAL | Status: DC | PRN
  Administered 2024-04-15: 1.6 mL via INTRATHECAL

## 2024-04-15 MED ORDER — CHLORHEXIDINE GLUCONATE 0.12 % MT SOLN
15.0000 mL | Freq: Once | OROMUCOSAL | Status: AC
  Administered 2024-04-15: 15 mL via OROMUCOSAL

## 2024-04-15 MED ORDER — ONDANSETRON HCL 4 MG/2ML IJ SOLN
INTRAMUSCULAR | Status: DC | PRN
  Administered 2024-04-15: 4 mg via INTRAVENOUS

## 2024-04-15 MED ORDER — ORAL CARE MOUTH RINSE: 15.0000 mL | Freq: Once | OROMUCOSAL | Status: AC

## 2024-04-15 MED ORDER — METHOCARBAMOL 1000 MG/10ML IJ SOLN: 500.0000 mg | Freq: Four times a day (QID) | INTRAMUSCULAR | Status: DC | PRN

## 2024-04-15 MED ORDER — DOCUSATE SODIUM 100 MG PO CAPS
100.0000 mg | ORAL_CAPSULE | Freq: Two times a day (BID) | ORAL | Status: DC
  Administered 2024-04-15 – 2024-04-17 (×4): 100 mg via ORAL
  Filled 2024-04-15 (×4): qty 1

## 2024-04-15 MED ORDER — TRAMADOL HCL 50 MG PO TABS
50.0000 mg | ORAL_TABLET | Freq: Four times a day (QID) | ORAL | Status: DC
  Administered 2024-04-15 – 2024-04-16 (×3): 50 mg via ORAL
  Filled 2024-04-15 (×3): qty 1

## 2024-04-15 MED ORDER — HYDROCODONE-ACETAMINOPHEN 5-325 MG PO TABS: 1.0000 | ORAL_TABLET | ORAL | Status: DC | PRN

## 2024-04-15 MED ORDER — ALUM & MAG HYDROXIDE-SIMETH 200-200-20 MG/5ML PO SUSP: 30.0000 mL | ORAL | Status: DC | PRN

## 2024-04-15 MED ORDER — HYDROCODONE-ACETAMINOPHEN 7.5-325 MG PO TABS: 1.0000 | ORAL_TABLET | ORAL | Status: DC | PRN

## 2024-04-15 MED ORDER — PHENYLEPHRINE 80 MCG/ML (10ML) SYRINGE FOR IV PUSH (FOR BLOOD PRESSURE SUPPORT)
PREFILLED_SYRINGE | INTRAVENOUS | Status: DC | PRN
  Administered 2024-04-15: 40 ug via INTRAVENOUS
  Administered 2024-04-15 (×2): 80 ug via INTRAVENOUS
  Administered 2024-04-15: 40 ug via INTRAVENOUS
  Administered 2024-04-15 (×6): 80 ug via INTRAVENOUS
  Administered 2024-04-15: 40 ug via INTRAVENOUS
  Administered 2024-04-15 (×6): 80 ug via INTRAVENOUS

## 2024-04-15 MED ORDER — PHENYLEPHRINE 80 MCG/ML (10ML) SYRINGE FOR IV PUSH (FOR BLOOD PRESSURE SUPPORT)
PREFILLED_SYRINGE | INTRAVENOUS | Status: AC
  Filled 2024-04-15: qty 20

## 2024-04-15 MED ORDER — FENTANYL CITRATE (PF) 50 MCG/ML IJ SOSY
PREFILLED_SYRINGE | INTRAMUSCULAR | Status: AC
  Filled 2024-04-15: qty 2

## 2024-04-15 MED ORDER — MENTHOL 3 MG MT LOZG: 1.0000 | LOZENGE | OROMUCOSAL | Status: DC | PRN

## 2024-04-15 MED ORDER — MIDAZOLAM HCL 5 MG/5ML IJ SOLN
INTRAMUSCULAR | Status: DC | PRN
  Administered 2024-04-15: 2 mg via INTRAVENOUS

## 2024-04-15 MED ORDER — 0.9 % SODIUM CHLORIDE (POUR BTL) OPTIME
TOPICAL | Status: DC | PRN
  Administered 2024-04-15: 1000 mL

## 2024-04-15 MED ORDER — METOCLOPRAMIDE HCL 5 MG/ML IJ SOLN
5.0000 mg | Freq: Three times a day (TID) | INTRAMUSCULAR | Status: DC | PRN
  Administered 2024-04-16: 10 mg via INTRAVENOUS
  Filled 2024-04-15: qty 2

## 2024-04-15 MED ORDER — CEFAZOLIN SODIUM-DEXTROSE 2-4 GM/100ML-% IV SOLN
2.0000 g | INTRAVENOUS | Status: AC
  Administered 2024-04-15: 2 g via INTRAVENOUS
  Filled 2024-04-15: qty 100

## 2024-04-15 MED ORDER — ONDANSETRON HCL 4 MG/2ML IJ SOLN
INTRAMUSCULAR | Status: AC
  Filled 2024-04-15: qty 2

## 2024-04-15 MED ORDER — PHENYLEPHRINE 80 MCG/ML (10ML) SYRINGE FOR IV PUSH (FOR BLOOD PRESSURE SUPPORT)
PREFILLED_SYRINGE | INTRAVENOUS | Status: AC
  Filled 2024-04-15: qty 10

## 2024-04-15 MED ORDER — METHOCARBAMOL 500 MG PO TABS
500.0000 mg | ORAL_TABLET | Freq: Four times a day (QID) | ORAL | Status: DC | PRN
  Administered 2024-04-15 – 2024-04-17 (×5): 500 mg via ORAL
  Filled 2024-04-15 (×5): qty 1

## 2024-04-15 MED ADMIN — Ephedrine Sulf-NaCl Soln Pref Syr 50 MG/10ML-0.9% (5 MG/ML): 2.5 mg | INTRAVENOUS | NDC 51754425001

## 2024-04-15 MED ADMIN — Tranexamic Acid-Sodium Chloride IV Soln 1000 MG/100ML-0.7%: 1000 mg | INTRAVENOUS | NDC 80830232901

## 2024-04-15 MED ADMIN — Ephedrine Sulf-NaCl Soln Pref Syr 50 MG/10ML-0.9% (5 MG/ML): 5 mg | INTRAVENOUS | NDC 51754425001

## 2024-04-15 MED FILL — Tranexamic Acid-Sodium Chloride IV Soln 1000 MG/100ML-0.7%: 1000.0000 mg | INTRAVENOUS | Qty: 100 | Status: AC

## 2024-04-15 SURGICAL SUPPLY — 38 items
BAG COUNTER SPONGE SURGICOUNT (BAG) ×1 IMPLANT
BAG ZIPLOCK 12X15 (MISCELLANEOUS) ×1 IMPLANT
BENZOIN TINCTURE PRP APPL 2/3 (GAUZE/BANDAGES/DRESSINGS) IMPLANT
BLADE SAW SGTL 18X1.27X75 (BLADE) ×1 IMPLANT
COVER PERINEAL POST (MISCELLANEOUS) ×1 IMPLANT
COVER SURGICAL LIGHT HANDLE (MISCELLANEOUS) ×1 IMPLANT
CUP ACET PNNCL SECTR W/GRIP 56 (Hips) IMPLANT
DERMABOND ADVANCED .7 DNX12 (GAUZE/BANDAGES/DRESSINGS) IMPLANT
DRAPE FOOT SWITCH (DRAPES) ×1 IMPLANT
DRAPE STERI IOBAN 125X83 (DRAPES) ×1 IMPLANT
DRAPE U-SHAPE 47X51 STRL (DRAPES) ×2 IMPLANT
DRSG AQUACEL AG ADV 3.5X10 (GAUZE/BANDAGES/DRESSINGS) ×1 IMPLANT
DURAPREP 26ML APPLICATOR (WOUND CARE) ×1 IMPLANT
ELECT PENCIL ROCKER SW 15FT (MISCELLANEOUS) ×1 IMPLANT
ELECT REM PT RETURN 15FT ADLT (MISCELLANEOUS) ×1 IMPLANT
GAUZE XEROFORM 1X8 LF (GAUZE/BANDAGES/DRESSINGS) IMPLANT
GLOVE BIO SURGEON STRL SZ7.5 (GLOVE) ×1 IMPLANT
GLOVE BIOGEL PI IND STRL 8 (GLOVE) ×2 IMPLANT
GLOVE ECLIPSE 8.0 STRL XLNG CF (GLOVE) ×1 IMPLANT
GOWN STRL REUS W/ TWL XL LVL3 (GOWN DISPOSABLE) ×2 IMPLANT
HEAD CERAMIC 36 PLUS 8.5 12 14 (Hips) IMPLANT
HEAD CERAMIC DELTA 36 PLUS 1.5 (Hips) IMPLANT
HOLDER FOLEY CATH W/STRAP (MISCELLANEOUS) ×1 IMPLANT
KIT TURNOVER KIT A (KITS) ×1 IMPLANT
PACK ANTERIOR HIP CUSTOM (KITS) ×1 IMPLANT
PINNACLE ALTRX PLUS 4 N 36X56 (Hips) IMPLANT
SET HNDPC FAN SPRY TIP SCT (DISPOSABLE) ×1 IMPLANT
STAPLER SKIN PROX 35W (STAPLE) IMPLANT
STEM FEMORAL SZ6 HIGH ACTIS (Stem) IMPLANT
STRIP CLOSURE SKIN 1/2X4 (GAUZE/BANDAGES/DRESSINGS) IMPLANT
SUT ETHIBOND NAB CT1 #1 30IN (SUTURE) ×1 IMPLANT
SUT ETHILON 2 0 PS N (SUTURE) IMPLANT
SUT MNCRL AB 4-0 PS2 18 (SUTURE) IMPLANT
SUT VIC AB 0 CT1 36 (SUTURE) ×1 IMPLANT
SUT VIC AB 1 CT1 36 (SUTURE) ×1 IMPLANT
SUT VIC AB 2-0 CT1 TAPERPNT 27 (SUTURE) ×2 IMPLANT
TRAY FOLEY MTR SLVR 16FR STAT (SET/KITS/TRAYS/PACK) ×1 IMPLANT
YANKAUER SUCT BULB TIP NO VENT (SUCTIONS) ×1 IMPLANT

## 2024-04-15 NOTE — Anesthesia Preprocedure Evaluation (Addendum)
 Anesthesia Evaluation  Patient identified by MRN, date of birth, ID band Patient awake    Reviewed: Allergy & Precautions, NPO status , Patient's Chart, lab work & pertinent test results  History of Anesthesia Complications (+) PONV and history of anesthetic complications  Airway Mallampati: I  TM Distance: >3 FB Neck ROM: Full    Dental  (+) Dental Advisory Given, Teeth Intact   Pulmonary neg pulmonary ROS   Pulmonary exam normal        Cardiovascular negative cardio ROS Normal cardiovascular exam     Neuro/Psych  Vertigo   negative psych ROS   GI/Hepatic negative GI ROS, Neg liver ROS,,,  Endo/Other  negative endocrine ROS    Renal/GU negative Renal ROS     Musculoskeletal  (+) Arthritis , Osteoarthritis,    Abdominal   Peds  Hematology negative hematology ROS (+)   Anesthesia Other Findings   Reproductive/Obstetrics  Breast cancer                               Anesthesia Physical Anesthesia Plan  ASA: 2  Anesthesia Plan: Spinal   Post-op Pain Management: Tylenol  PO (pre-op)*   Induction:   PONV Risk Score and Plan: 3 and Treatment may vary due to age or medical condition and Propofol  infusion  Airway Management Planned: Natural Airway and Simple Face Mask  Additional Equipment: None  Intra-op Plan:   Post-operative Plan:   Informed Consent: I have reviewed the patients History and Physical, chart, labs and discussed the procedure including the risks, benefits and alternatives for the proposed anesthesia with the patient or authorized representative who has indicated his/her understanding and acceptance.       Plan Discussed with: CRNA and Anesthesiologist  Anesthesia Plan Comments: (Labs reviewed, platelets acceptable. Discussed risks and benefits of spinal, including spinal/epidural hematoma, infection, failed block, and PDPH. Patient expressed understanding  and wished to proceed. )         Anesthesia Quick Evaluation

## 2024-04-15 NOTE — Anesthesia Procedure Notes (Signed)
 Spinal  Patient location during procedure: OR Start time: 04/15/2024 12:21 PM End time: 04/15/2024 12:24 PM Reason for block: surgical anesthesia Staffing Performed: anesthesiologist  Anesthesiologist: Lucious Debby BRAVO, MD Performed by: Lucious Debby BRAVO, MD Authorized by: Lucious Debby BRAVO, MD   Preanesthetic Checklist Completed: patient identified, IV checked, risks and benefits discussed, surgical consent, monitors and equipment checked, pre-op evaluation and timeout performed Spinal Block Patient position: sitting Prep: DuraPrep Patient monitoring: heart rate, cardiac monitor, continuous pulse ox and blood pressure Approach: midline Location: L3-4 Injection technique: single-shot Needle Needle type: Pencan  Needle gauge: 24 G Additional Notes Consent was obtained prior to the procedure with all questions answered and concerns addressed. Risks including, but not limited to, bleeding, infection, nerve damage, paralysis, failed block, inadequate analgesia, allergic reaction, high spinal, itching, and headache were discussed and the patient wished to proceed. Functioning IV was confirmed and monitors were applied. Sterile prep and drape, including hand hygiene, mask, and sterile gloves were used. The patient was positioned and the spine was prepped. The skin was anesthetized with lidocaine . Free flow of clear CSF was obtained prior to injecting local anesthetic into the CSF. The spinal needle aspirated freely following injection. The needle was carefully withdrawn. The patient tolerated the procedure well.   Debby Lucious, MD

## 2024-04-15 NOTE — Transfer of Care (Signed)
 Immediate Anesthesia Transfer of Care Note  Patient: Deborah Mcconnell  Procedure(s) Performed: ARTHROPLASTY, HIP, TOTAL, ANTERIOR APPROACH (Right: Hip)  Patient Location: PACU  Anesthesia Type:Spinal  Level of Consciousness: awake  Airway & Oxygen Therapy: Patient Spontanous Breathing and Patient connected to face mask oxygen  Post-op Assessment: Report given to RN and Post -op Vital signs reviewed and stable  Post vital signs: Reviewed and stable  Last Vitals:  Vitals Value Taken Time  BP 99/57 04/15/24 14:01  Temp    Pulse 87 04/15/24 14:03  Resp 24 04/15/24 14:03  SpO2 100 % 04/15/24 14:03  Vitals shown include unfiled device data.  Last Pain:  Vitals:   04/15/24 1046  TempSrc: Oral  PainSc:          Complications: No notable events documented.

## 2024-04-15 NOTE — Anesthesia Postprocedure Evaluation (Signed)
 Anesthesia Post Note  Patient: Deborah Mcconnell  Procedure(s) Performed: ARTHROPLASTY, HIP, TOTAL, ANTERIOR APPROACH (Right: Hip)     Patient location during evaluation: PACU Anesthesia Type: Spinal Level of consciousness: awake and alert Pain management: pain level controlled Vital Signs Assessment: post-procedure vital signs reviewed and stable Respiratory status: spontaneous breathing and respiratory function stable Cardiovascular status: blood pressure returned to baseline and stable Postop Assessment: spinal receding and no apparent nausea or vomiting Anesthetic complications: no   No notable events documented.  Last Vitals:  Vitals:   04/15/24 1413 04/15/24 1430  BP:  104/62  Pulse: 72 (!) 56  Resp: 15 14  Temp:    SpO2: 100% 100%    Last Pain:  Vitals:   04/15/24 1443  TempSrc:   PainSc: 4                  Debby FORBES Like

## 2024-04-15 NOTE — Evaluation (Signed)
 Physical Therapy Evaluation Patient Details Name: Deborah Mcconnell MRN: [id number] DOB: Aug 12, 1950 Today's Date: 04/15/2024  History of Present Illness  73 yo female presents to therapy s/p R THA, anterior approach on 04/15/2024 due to failure of conservative measures. Pt PMH includes but is not limited to: liver cyst, OA, pain, vertigo, L breast ca, LBP and HLD.  Clinical Impression      Latorria Zeoli is a 73 y.o. female POD 0 s/p R THA. Patient reports IND with mobility at baseline. Patient is now limited by functional impairments (see PT problem list below) and requires min/mod A  for bed mobility and unable to progress with transfers and gait tasks at time of eval due to reports of light headedness once seated EOB. Pt reports hx of SI joint  and LBP worsening at night and pt often sleeping in recliner and at time of eval back pain worse that R hip and LE. Patient instructed in exercise to facilitate ROM and circulation to manage edema. Patient will benefit from continued skilled PT interventions to address impairments and progress towards PLOF. Acute PT will follow to progress mobility and stair training in preparation for safe discharge home with family support and  [facility] services.     If plan is discharge home, recommend the following: A little help with walking and/or transfers;A little help with bathing/dressing/bathroom;Assistance with cooking/housework;Assist for transportation;Help with stairs or ramp for entrance   Can travel by private vehicle        Equipment Recommendations None recommended by PT  Recommendations for Other Services       Functional Status Assessment Patient has had a recent decline in their functional status and demonstrates the ability to make significant improvements in function in a reasonable and predictable amount of time.     Precautions / Restrictions Precautions Precautions: Fall Restrictions Weight Bearing Restrictions Per Provider Order:  No RLE Weight Bearing Per Provider Order: Weight bearing as tolerated      Mobility  Bed Mobility Overal bed mobility: Needs Assistance Bed Mobility: Supine to Sit, Sit to Supine     Supine to sit: Min assist, HOB elevated, Used rails Sit to supine: Mod assist   General bed mobility comments: min A for R LE and trunk, increased time, cues and use of hospital bed for supine to sit, pt seated EOB and reported feeling light headed and mod A to return to bed and min A to reposition in bed  with pt breiefly in trendelburg Bp 126/66 (63 PR) and nurse made aware family present    Transfers                   General transfer comment: NT    Ambulation/Gait               General Gait Details: NT  Stairs            Wheelchair Mobility     Tilt Bed    Modified Rankin (Stroke Patients Only)       Balance Overall balance assessment: Needs assistance Sitting-balance support: Feet supported Sitting balance-Leahy Scale: Good                                       Pertinent Vitals/Pain Pain Assessment Pain Assessment: 0-10 Pain Score: 8  Pain Location: R hip and SI/LBP Pain Descriptors / Indicators: Aching, Constant, Discomfort, Grimacing, Operative site guarding,  Stabbing, Shooting, Sharp (grabbing in groin) Pain Intervention(s): Limited activity within patient's tolerance, Monitored during session, Repositioned, Premedicated before session, Ice applied    Home Living Family/patient expects to be discharged to:: Private residence Living Arrangements: Spouse/significant other Available Help at Discharge: Family Type of Home: House Home Access: Stairs to enter Entrance Stairs-Rails: Right Entrance Stairs-Number of Steps: 3   Home Layout: Two level;Able to live on main level with bedroom/bathroom Home Equipment: Rolling Walker (2 wheels);Cane - single point      Prior Function Prior Level of Function : Independent/Modified  Independent;Driving             Mobility Comments: IND no AD for all ADLs, self care tasks and IADLs       Extremity/Trunk Assessment        Lower Extremity Assessment Lower Extremity Assessment: RLE deficits/detail RLE Deficits / Details: ankle DF/PF 4/5 RLE Sensation: WNL    Cervical / Trunk Assessment Cervical / Trunk Assessment: Normal  Communication   Communication Communication: No apparent difficulties    Cognition Arousal: Alert Behavior During Therapy: WFL for tasks assessed/performed   PT - Cognitive impairments: No apparent impairments                         Following commands: Intact       Cueing       General Comments      Exercises Total Joint Exercises Ankle Circles/Pumps: AROM, Both, 5 reps, Supine   Assessment/Plan    PT Assessment Patient needs continued PT services  PT Problem List Decreased strength;Decreased range of motion;Decreased activity tolerance;Decreased balance;Decreased mobility;Decreased coordination;Pain       PT Treatment Interventions DME instruction;Gait training;Stair training;Functional mobility training;Therapeutic activities;Therapeutic exercise;Balance training;Neuromuscular re-education;Patient/family education;Modalities    PT Goals (Current goals can be found in the Care Plan section)  Acute Rehab PT Goals Patient Stated Goal: to not have the back pain anymore PT Goal Formulation: With patient Time For Goal Achievement: 04/29/24 Potential to Achieve Goals: Good    Frequency 7X/week     Co-evaluation               AM-PAC PT 6 Clicks Mobility  Outcome Measure Help needed turning from your back to your side while in a flat bed without using bedrails?: A Little Help needed moving from lying on your back to sitting on the side of a flat bed without using bedrails?: A Little Help needed moving to and from a bed to a chair (including a wheelchair)?: Total Help needed standing up from a  chair using your arms (e.g., wheelchair or bedside chair)?: Total Help needed to walk in hospital room?: Total Help needed climbing 3-5 steps with a railing? : Total 6 Click Score: 10    End of Session   Activity Tolerance: Patient limited by pain;Other (comment) (light headedness) Patient left: in bed;with call bell/phone within reach;with family/visitor present Nurse Communication: Mobility status;Other (comment);Patient requests pain meds (Bp and pt physiological response) PT Visit Diagnosis: Unsteadiness on feet (R26.81);Other abnormalities of gait and mobility (R26.89);Muscle weakness (generalized) (M62.81);Difficulty in walking, not elsewhere classified (R26.2);Pain Pain - Right/Left: Right Pain - part of body: Hip;Leg (LBP/SI with pt having hx of SI joint pain worse at night and often sleeping in recliner)    Time: 8156-8091 PT Time Calculation (min) (ACUTE ONLY): 25 min   Charges:   PT Evaluation $PT Eval Low Complexity: 1 Low PT Treatments $Therapeutic Activity: 8-22 mins PT General Charges $$  ACUTE PT VISIT: 1 Visit         [place], PT Acute Rehab   [place] VEAR Drone 04/15/2024, 7:30 PM

## 2024-04-15 NOTE — Interval H&P Note (Signed)
 History and Physical Interval Note: The patient understands that she is here today for a right total hip replacement to treat her significant right hip pain and arthritis.  There has been no acute or interval change in her medical status.  The risks and benefits of surgery have been discussed in detail and informed consent has been obtained.  The right operative hip has been marked.  04/15/2024 11:05 AM  [name]  has presented today for surgery, with the diagnosis of osteoarthritis right hip.  The various methods of treatment have been discussed with the patient and family. After consideration of risks, benefits and other options for treatment, the patient has consented to  Procedure(s): ARTHROPLASTY, HIP, TOTAL, ANTERIOR APPROACH (Right) as a surgical intervention.  The patient's history has been reviewed, patient examined, no change in status, stable for surgery.  I have reviewed the patient's chart and labs.  Questions were answered to the patient's satisfaction.     Deborah Mcconnell

## 2024-04-15 NOTE — Op Note (Signed)
 Operative Note  Date of operation: 03/16/2024 Preoperative diagnosis: Right hip primary osteoarthritis Postoperative diagnosis: Same  Procedure: Right direct anterior total hip arthroplasty  Implants: Implant Name Type Inv. Item Serial No. Manufacturer Lot No. LRB No. Used Action  PINNACLE ALTRX PLUS 4 N [id number] - [id number] Hips PINNACLE ALTRX PLUS 4 N [id number]  [employer] [id number] Right 1 Implanted  CUP ACET PNNCL SECTR W/GRIP 56 - [id number] Hips CUP ACET PNNCL SECTR W/GRIP 56  [employer] [id number] Right 1 Implanted  STEM FEMORAL SZ6 HIGH ACTIS - [id number] Stem STEM FEMORAL SZ6 HIGH ACTIS  [employer]PUY ORTH[employer] [id number] Right 1 Implanted  HEAD CERAMIC 36 PLUS 8.5 12 14  - [id number] Hips HEAD CERAMIC 36 PLUS 8.5 12 14   [employer]TH[employer] [id number] Right 1 Implanted   Surgeon: Lonni GRADE. Vernetta, MD Assistant: Tory Gaskins, PA-C  Anesthesia: Spinal EBL: 250 cc Antibiotics: 2 g IV Ancef  Complications: None  Indications: The patient is a very active 73 year old female with debilitating arthritis and pain involving her right hip.  At this point her right hip pain is daily and it is detrimentally fasting her mobility, her quality of life and her actives daily living.  She does wish to receive the hip replacement we agree with this as well.  We did discuss in length in detail the risks of acute blood loss anemia, nerve or vessel injury, fracture, infection, DVT, dislocation, leg length differences, wound healing issues, implant failure.  She understands that our goals are hopefully decreased pain, improved mobility and improved quality of life.  Procedure description: After informed consent was obtained and the appropriate right hip was marked, the patient was brought to the operating room and set up on the stretcher where spinal anesthesia was obtained.  She was laid in spine position on stretcher and traction boots were placed on both her feet.  A Foley catheter was placed as well.   Next she was placed supine on the Hana fracture table with a perineal post and placed in both legs and in line skeletal traction devices but no traction applied.  The right hip and pelvis were assessed radiographically.  The right hip was prepped and draped with DuraPrep and sterile drapes.  A timeout was called and she was notified of the correct patient and correct right hip.  Incision was then made just inferior and posterior to the ASIS and carried slightly obliquely down the leg.  Dissection was carried down to the tensor fascia lata muscle and the tensor fascia was divided longitudinally to proceed with a direct intra approach the hip.  Circumflex vessels were identified and cauterized.  The hip capsule identified and opened up in L-type format finding a large joint effusion.  Cobra retractors were placed around the medial and lateral femoral neck and a femoral neck cut was made with the oscillating saw just proximal to the lesser trochanter and this cut was completed with an osteotome.  A corkscrew was placed in the femoral head and the femoral head was removed in its entirety and there was a wide area of significant cartilage wear.  A bent Hohmann was then placed over the medial acetabular rim and remanence of the acetabular labrum and other debris removed.  Reaming was then initiated from a size 43 reamer and stepwise increments going up to a size 55 reamer with all reamers placed under direct visualization and the last reamer also placed under direct fluoroscopy in order to obtain the depth of reaming, the inclination and the anteversion.  The real DePuy sector GRIPTION acetabular component size 56 was then placed without difficulty followed by 36+4 polythene liner.  Attention was then turned to the femur.  With the right leg externally rotated to 120 degrees, extended and abducted, a Mueller retractor was placed medially and a Hohmann tractor behind the greater trochanter.  The lateral joint capsule was  released and a box cutting osteotome was used to enter the femoral canal.  Broaching was then initiated using the Actis broaching system from a size 0 going up to a size 6.  With a size 6 in place we trialed a standard offset femoral neck and a 36+1.5 trial head ball.  The right leg was brought over and up and with traction and internal rotation reduced the pelvis.  Based on radiographic and clinical assessment we felt like we need to just more offset.  We dislocated the hip and of the trial components.  We then placed the real Actis femoral component size 6 with high offset and went with a 36+1.5 ceramic head ball.  Again this was reduced in the pelvis but it was not stable on exam.  We had removed that size 36+1.5 ceramic head ball and went up to a 36+8.5 ceramic head ball.  This was reduced in the pelvis and on this assessment radiographically and clinically replaced with stability as well as leg length and offset.  We then irrigated the soft tissue with normal saline solution.  The joint capsule was closed with interrupted #1 Ethibond suture followed by #1 Vicryl close attention fashion.  0 Vicryl was used to close the deep tissue and 2-0 Vicryl was used to close subcutaneous tissue.  The skin was closed with staples.  An Aquacel dressing was applied.  The patient was taken recovery room.  Tory Gaskins, PA-C did assist during the entire case and beginning the end and his assistance was crucial and medically necessary for soft tissue management and retraction, helping guide implant placement and a layered closure of the wound.

## 2024-04-16 DIAGNOSIS — Z79899 Other long term (current) drug therapy: Secondary | ICD-10-CM | POA: Diagnosis not present

## 2024-04-16 DIAGNOSIS — M810 Age-related osteoporosis without current pathological fracture: Secondary | ICD-10-CM | POA: Diagnosis present

## 2024-04-16 DIAGNOSIS — E785 Hyperlipidemia, unspecified: Secondary | ICD-10-CM | POA: Diagnosis present

## 2024-04-16 DIAGNOSIS — M1611 Unilateral primary osteoarthritis, right hip: Secondary | ICD-10-CM | POA: Diagnosis present

## 2024-04-16 DIAGNOSIS — Z7982 Long term (current) use of aspirin: Secondary | ICD-10-CM | POA: Diagnosis not present

## 2024-04-16 DIAGNOSIS — Z833 Family history of diabetes mellitus: Secondary | ICD-10-CM | POA: Diagnosis not present

## 2024-04-16 DIAGNOSIS — Z8616 Personal history of COVID-19: Secondary | ICD-10-CM | POA: Diagnosis not present

## 2024-04-16 DIAGNOSIS — Z8249 Family history of ischemic heart disease and other diseases of the circulatory system: Secondary | ICD-10-CM | POA: Diagnosis not present

## 2024-04-16 DIAGNOSIS — Z853 Personal history of malignant neoplasm of breast: Secondary | ICD-10-CM | POA: Diagnosis not present

## 2024-04-16 DIAGNOSIS — Z23 Encounter for immunization: Secondary | ICD-10-CM | POA: Diagnosis present

## 2024-04-16 LAB — URINALYSIS, ROUTINE W REFLEX MICROSCOPIC
Bacteria, UA: NONE SEEN
Bilirubin Urine: NEGATIVE
Glucose, UA: NEGATIVE mg/dL
Hgb urine dipstick: NEGATIVE
Ketones, ur: 20 mg/dL — AB
Nitrite: NEGATIVE
Protein, ur: NEGATIVE mg/dL
Specific Gravity, Urine: 1.016 (ref 1.005–1.030)
pH: 5 (ref 5.0–8.0)

## 2024-04-16 LAB — BASIC METABOLIC PANEL WITH GFR
Anion gap: 8 (ref 5–15)
BUN: 8 mg/dL (ref 8–23)
CO2: 26 mmol/L (ref 22–32)
Calcium: 8.4 mg/dL — ABNORMAL LOW (ref 8.9–10.3)
Chloride: 106 mmol/L (ref 98–111)
Creatinine, Ser: 0.58 mg/dL (ref 0.44–1.00)
GFR, Estimated: >60 mL/min (ref >60)
Glucose, Bld: 142 mg/dL — ABNORMAL HIGH (ref 70–99)
Potassium: 3.7 mmol/L (ref 3.5–5.1)
Sodium: 140 mmol/L (ref 135–145)

## 2024-04-16 LAB — CBC
HCT: 36.2 % (ref 36.0–46.0)
Hemoglobin: 11.9 g/dL — ABNORMAL LOW (ref 12.0–15.0)
MCH: 32.2 pg (ref 26.0–34.0)
MCHC: 32.9 g/dL (ref 30.0–36.0)
MCV: 97.8 fL (ref 80.0–100.0)
Platelets: 173 K/uL (ref 150–400)
RBC: 3.7 MIL/uL — ABNORMAL LOW (ref 3.87–5.11)
RDW: 12.5 % (ref 11.5–15.5)
WBC: 8.5 K/uL (ref 4.0–10.5)
nRBC: 0 % (ref 0.0–0.2)

## 2024-04-16 MED ORDER — TRAMADOL HCL 50 MG PO TABS
50.0000 mg | ORAL_TABLET | Freq: Four times a day (QID) | ORAL | Status: DC | PRN
  Administered 2024-04-16: 50 mg via ORAL
  Filled 2024-04-16: qty 1

## 2024-04-16 MED ORDER — PROMETHAZINE HCL 25 MG PO TABS
12.5000 mg | ORAL_TABLET | Freq: Four times a day (QID) | ORAL | Status: DC | PRN
  Administered 2024-04-17: 12.5 mg via ORAL
  Filled 2024-04-16: qty 1

## 2024-04-16 MED ORDER — KETOROLAC TROMETHAMINE 15 MG/ML IJ SOLN
7.5000 mg | Freq: Four times a day (QID) | INTRAMUSCULAR | Status: AC
  Administered 2024-04-16 – 2024-04-17 (×3): 7.5 mg via INTRAVENOUS
  Filled 2024-04-16 (×3): qty 1

## 2024-04-16 MED ORDER — HYDROCODONE-ACETAMINOPHEN 5-325 MG PO TABS
1.0000 | ORAL_TABLET | ORAL | Status: DC | PRN
  Administered 2024-04-16 – 2024-04-17 (×2): 1 via ORAL
  Filled 2024-04-16: qty 1
  Filled 2024-04-16: qty 2
  Filled 2024-04-16: qty 1

## 2024-04-16 NOTE — TOC Transition Note (Signed)
 Transition of Care [facility]) - Discharge Note   Patient Details  Name: Deborah Mcconnell MRN: [id number] Date of Birth: 02/11/1951  [facility] CM/SW Contact:  Heather DELENA Saltness, LCSW Phone Number: 04/16/2024, 10:17 AM   Clinical Narrative:    CSW met with pt at bedside to discuss discharge planning. HH PT services with [facility], pre-arranged at Ortho office. Pt denies any DME needs, reports has RW at home. No further TOC needs at this time.   Final next level of care: Home w Home Health Services Barriers to Discharge: Barriers Resolved   Patient Goals and CMS Choice Patient states their goals for this hospitalization and ongoing recovery are:: To return home        Discharge Placement  Home              Patient to be transferred to facility by: Family Name of family member notified: Patient Patient and family notified of of transfer: 04/16/24  Discharge Plan and Services Additional resources added to the After Visit Summary for  Follow Up                DME Arranged: N/A DME Agency: NA       HH Arranged: NA HH Agency: NA        Social Drivers of Health (SDOH) Interventions SDOH Screenings   Food Insecurity: No Food Insecurity (04/15/2024)  Housing: Low Risk  (04/15/2024)  Transportation Needs: No Transportation Needs (04/15/2024)  Utilities: Not At Risk (04/15/2024)  Alcohol Screen: Low Risk  (09/04/2023)  Depression (PHQ2-9): Low Risk  (10/19/2023)  Financial Resource Strain: Low Risk  (10/02/2023)   Received from [facility]  Physical Activity: Insufficiently Active (09/04/2023)  Social Connections: Socially Integrated (04/15/2024)  Stress: No Stress Concern Present (09/04/2023)  Tobacco Use: Low Risk  (04/15/2024)  Health Literacy: Adequate Health Literacy (09/04/2023)     Readmission Risk Interventions     No data to display          Signed: Heather Saltness, MSW, LCSW Clinical Social Worker Inpatient Care  Management 04/16/2024 10:19 AM

## 2024-04-16 NOTE — Progress Notes (Signed)
 Subjective: 1 Day Post-Op Procedure(s) (LRB): ARTHROPLASTY, HIP, TOTAL, ANTERIOR APPROACH (Right) Patient reports pain as moderate.  Nausea continues to be an issue as well as significant back pain.  Objective: Vital signs in last 24 hours: Temp:  [97.4 F (36.3 C)-99.3 F (37.4 C)] 99.3 F (37.4 C) (10/18 0659) Pulse Rate:  [56-85] 80 (10/18 0659) Resp:  [12-24] 18 (10/18 0659) BP: (94-126)/(57-73) 118/67 (10/18 0659) SpO2:  [95 %-100 %] 99 % (10/18 0659)  Intake/Output from previous day: 10/17 0701 - 10/18 0700 In: 3059.6 [P.O.:360; I.V.:2299.6; IV Piggyback:400] Out: 1950 [Urine:1700; Blood:250] Intake/Output this shift: Total I/O In: 240 [P.O.:240] Out: 150 [Urine:150]  Recent Labs    04/16/24 0330  HGB 11.9*   Recent Labs    04/16/24 0330  WBC 8.5  RBC 3.70*  HCT 36.2  PLT 173   Recent Labs    04/16/24 0330  NA 140  K 3.7  CL 106  CO2 26  BUN 8  CREATININE 0.58  GLUCOSE 142*  CALCIUM 8.4*   No results for input(s): LABPT, INR in the last 72 hours.  Sensation intact distally Intact pulses distally Dorsiflexion/Plantar flexion intact Incision: dressing C/D/I   Assessment/Plan: 1 Day Post-Op Procedure(s) (LRB): ARTHROPLASTY, HIP, TOTAL, ANTERIOR APPROACH (Right) Up with therapy Plan for discharge tomorrow Discharge home with home health  Too much nausea for discharge today.    Deborah Mcconnell 04/16/2024, 11:30 AM

## 2024-04-16 NOTE — Discharge Instructions (Signed)

## 2024-04-16 NOTE — Progress Notes (Signed)
 Physical Therapy Treatment Patient Details Name: Deborah Mcconnell MRN: [id number] DOB: 1951-01-11 Today's Date: 04/16/2024   History of Present Illness 73 yo female presents to therapy s/p R THA, anterior approach on 04/15/2024 due to failure of conservative measures. Pt PMH includes but is not limited to: liver cyst, OA, pain, vertigo, L breast ca, LBP and HLD.    PT Comments  Pt agreeable to working with therapy. Reports significant back pain > R hip pain. Overall, pt is Min A for mobility but limited by pain, lightheadedness, nausea. BP ok during session. Multiple attempts required to get pt OOB and into recliner. Encouraged pt to spend as much time OOB, as tolerated, to help with managing back pain. Pain rated 9/10 during session. RN in to medicate pt at end of session. Will continue to follow pt and progress activity as safely able.     If plan is discharge home, recommend the following: A little help with walking and/or transfers;A little help with bathing/dressing/bathroom;Assistance with cooking/housework;Assist for transportation;Help with stairs or ramp for entrance   Can travel by private vehicle        Equipment Recommendations  None recommended by PT    Recommendations for Other Services       Precautions / Restrictions Precautions Precautions: Fall Restrictions Weight Bearing Restrictions Per Provider Order: No RLE Weight Bearing Per Provider Order: Weight bearing as tolerated     Mobility  Bed Mobility Overal bed mobility: Needs Assistance Bed Mobility: Supine to Sit     Supine to sit: Min assist, HOB elevated, Used rails     General bed mobility comments: Assist for R LE. Increased time. Cues for technique. HOB elevated and pt required use of rails. Reports some lightheaded and nausea. BP ok.    Transfers Overall transfer level: Needs assistance Equipment used: Rolling walker (2 wheels) Transfers: Sit to/from Stand, Bed to chair/wheelchair/BSC Sit to  Stand: Min assist, From elevated surface Stand pivot transfers: Min assist         General transfer comment: Cues for safety, technique, hand/LE placement. Assist to rise, steady, control descent. Increased time.    Ambulation/Gait               General Gait Details: NT yet-limited by pain, lightheadedness, nausea currently   Stairs             Wheelchair Mobility     Tilt Bed    Modified Rankin (Stroke Patients Only)       Balance Overall balance assessment: Needs assistance         Standing balance support: Bilateral upper extremity supported, Reliant on assistive device for balance, During functional activity Standing balance-Leahy Scale: Poor                              Communication Communication Communication: No apparent difficulties  Cognition Arousal: Alert Behavior During Therapy: WFL for tasks assessed/performed   PT - Cognitive impairments: No apparent impairments                         Following commands: Intact      Cueing    Exercises Total Joint Exercises Ankle Circles/Pumps: AROM, Both, 10 reps Quad Sets: AROM, Both, 10 reps Heel Slides: AAROM, Right, 10 reps Hip ABduction/ADduction: AAROM, Right, 10 reps    General Comments        Pertinent Vitals/Pain Pain Assessment Pain Assessment: 0-10  Pain Score: 9  Pain Location: R low back/SI pain primarily; also some R hip post op pain Pain Descriptors / Indicators: Aching, Constant, Discomfort, Grimacing, Operative site guarding, Stabbing, Shooting, Sharp Pain Intervention(s): Limited activity within patient's tolerance, Monitored during session, Repositioned, Ice applied    Home Living                          Prior Function            PT Goals (current goals can now be found in the care plan section) Progress towards PT goals: Progressing toward goals    Frequency    7X/week      PT Plan      Co-evaluation               AM-PAC PT 6 Clicks Mobility   Outcome Measure  Help needed turning from your back to your side while in a flat bed without using bedrails?: A Little Help needed moving from lying on your back to sitting on the side of a flat bed without using bedrails?: A Little Help needed moving to and from a bed to a chair (including a wheelchair)?: A Lot Help needed standing up from a chair using your arms (e.g., wheelchair or bedside chair)?: A Lot Help needed to walk in hospital room?: Total Help needed climbing 3-5 steps with a railing? : Total 6 Click Score: 12    End of Session Equipment Utilized During Treatment: Gait belt Activity Tolerance: Patient limited by pain Patient left: in chair;with call bell/phone within reach;with family/visitor present   PT Visit Diagnosis: Unsteadiness on feet (R26.81);Other abnormalities of gait and mobility (R26.89);Muscle weakness (generalized) (M62.81);Difficulty in walking, not elsewhere classified (R26.2);Pain Pain - Right/Left: Right Pain - part of body: Hip (back)     Time: 8869-8776 PT Time Calculation (min) (ACUTE ONLY): 53 min  Charges:    $Therapeutic Exercise: 8-22 mins $Therapeutic Activity: 53-67 mins PT General Charges $$ ACUTE PT VISIT: 1 Visit                         Dannial SQUIBB, PT Acute Rehabilitation  Office: [phone]

## 2024-04-16 NOTE — Progress Notes (Signed)
 Physical Therapy Treatment Patient Details Name: Deborah Mcconnell MRN: [id number] DOB: 1951-02-08 Today's Date: 04/16/2024   History of Present Illness 73 yo female presents to therapy s/p R THA, anterior approach on 04/15/2024 due to failure of conservative measures. Pt PMH includes but is not limited to: liver cyst, OA, pain, vertigo, L breast ca, LBP and HLD.    PT Comments  Pt agreeable to 2nd session. Pain meds working better but pt still has some lightheadedness/nausea-vomited x 1 at end of session-made RN aware. Pain rated 4/10 during session-back pain still much worse than hip pain. Will continue to progress activity as tolerated.     If plan is discharge home, recommend the following: A little help with walking and/or transfers;A little help with bathing/dressing/bathroom;Assistance with cooking/housework;Assist for transportation;Help with stairs or ramp for entrance   Can travel by private vehicle        Equipment Recommendations  None recommended by PT    Recommendations for Other Services       Precautions / Restrictions Precautions Precautions: Fall Restrictions Weight Bearing Restrictions Per Provider Order: No RLE Weight Bearing Per Provider Order: Weight bearing as tolerated     Mobility  Bed Mobility Overal bed mobility: Needs Assistance Bed Mobility: Supine to Sit     Supine to sit: Min assist, HOB elevated, Used rails     General bed mobility comments: Assist for R LE. Increased time. Cues for technique. HOB elevated and pt required use of rails. Reports some lightheaded and nausea.    Transfers Overall transfer level: Needs assistance Equipment used: Rolling walker (2 wheels) Transfers: Sit to/from Stand Sit to Stand: Min assist, From elevated surface          General transfer comment: x2, once from bed, once from elevated BSC. Cues for safety, technique, hand/LE placement. Light steadying assist only.    Ambulation/Gait Ambulation/Gait  assistance: Contact guard assist Gait Distance (Feet): 7 Feet (x2) Assistive device: Rolling walker (2 wheels) Gait Pattern/deviations: Step-to pattern       General Gait Details: Cues for safety, sequencing. Pt tolerated short distance fairly well with respect to pain; limited by nausea/lightheadedness.   Stairs             Wheelchair Mobility     Tilt Bed    Modified Rankin (Stroke Patients Only)       Balance Overall balance assessment: Needs assistance         Standing balance support: Bilateral upper extremity supported, Reliant on assistive device for balance, During functional activity Standing balance-Leahy Scale: Poor                              Communication Communication Communication: No apparent difficulties  Cognition Arousal: Alert Behavior During Therapy: WFL for tasks assessed/performed   PT - Cognitive impairments: No apparent impairments                         Following commands: Intact      Cueing Cueing Techniques: Verbal cues  Exercises Total Joint Exercises Ankle Circles/Pumps: AROM, Both, 10 reps Quad Sets: AROM, Both, 10 reps Heel Slides: AAROM, Right, 10 reps Hip ABduction/ADduction: AAROM, Right, 10 reps    General Comments        Pertinent Vitals/Pain Pain Assessment Pain Assessment: 0-10 Pain Score: 4  Pain Location: R low back/SI pain primarily; also some R hip post op pain Pain Descriptors /  Indicators: Aching, Grimacing, Operative site guarding, Stabbing, Sharp Pain Intervention(s): Limited activity within patient's tolerance, Monitored during session, Repositioned, Ice applied, Premedicated before session    Home Living                          Prior Function            PT Goals (current goals can now be found in the care plan section) Progress towards PT goals: Progressing toward goals    Frequency    7X/week      PT Plan      Co-evaluation               AM-PAC PT 6 Clicks Mobility   Outcome Measure  Help needed turning from your back to your side while in a flat bed without using bedrails?: A Little Help needed moving from lying on your back to sitting on the side of a flat bed without using bedrails?: A Little Help needed moving to and from a bed to a chair (including a wheelchair)?: A Little Help needed standing up from a chair using your arms (e.g., wheelchair or bedside chair)?: A Little Help needed to walk in hospital room?: A Little Help needed climbing 3-5 steps with a railing? : Total 6 Click Score: 16    End of Session Equipment Utilized During Treatment: Gait belt Activity Tolerance: Patient limited by pain (limited by nausea/lighthededness) Patient left: in bed;with call bell/phone within reach;with bed alarm set;with family/visitor present   PT Visit Diagnosis: Unsteadiness on feet (R26.81);Other abnormalities of gait and mobility (R26.89);Muscle weakness (generalized) (M62.81);Difficulty in walking, not elsewhere classified (R26.2);Pain Pain - Right/Left: Right Pain - part of body: Hip (back)     Time: 8563-8493 PT Time Calculation (min) (ACUTE ONLY): 30 min  Charges:    $Gait Training: 23-37 mins $Therapeutic Exercise: 8-22 mins $Therapeutic Activity: 53-67 mins PT General Charges $$ ACUTE PT VISIT: 1 Visit                        Dannial SQUIBB, PT Acute Rehabilitation  Office: [phone]

## 2024-04-16 NOTE — Plan of Care (Signed)
   Problem: Activity: Goal: Risk for activity intolerance will decrease Outcome: Progressing   Problem: Pain Managment: Goal: General experience of comfort will improve and/or be controlled Outcome: Progressing   Problem: Safety: Goal: Ability to remain free from injury will improve Outcome: Progressing

## 2024-04-16 NOTE — Care Management Obs Status (Signed)
 MEDICARE OBSERVATION STATUS NOTIFICATION   Patient Details  Name: Panagiota Perfetti MRN: [id number] Date of Birth: 21-Jan-1951   Medicare Observation Status Notification Given:  Yes    Dequita Schleicher A Ellon Marasco, LCSW 04/16/2024, 9:56 AM

## 2024-04-17 MED ORDER — ONDANSETRON 4 MG PO TBDP: 4.0000 mg | ORAL_TABLET | Freq: Three times a day (TID) | ORAL | 0 refills | Status: DC | PRN

## 2024-04-17 MED ORDER — ASPIRIN 81 MG PO CHEW: 81.0000 mg | CHEWABLE_TABLET | Freq: Two times a day (BID) | ORAL | 0 refills | Status: DC

## 2024-04-17 MED ORDER — TIZANIDINE HCL 4 MG PO TABS: 4.0000 mg | ORAL_TABLET | Freq: Four times a day (QID) | ORAL | 0 refills | Status: DC | PRN

## 2024-04-17 MED ORDER — HYDROCODONE-ACETAMINOPHEN 5-325 MG PO TABS: 1.0000 | ORAL_TABLET | Freq: Four times a day (QID) | ORAL | 0 refills | Status: DC | PRN

## 2024-04-17 NOTE — Progress Notes (Signed)
 Patient ID: Deborah Mcconnell, female   DOB: Aug 21, 1950, 73 y.o.   MRN: [id number] The patient is awake and alert with stable vital signs.  Family is at the bedside.  Her right operative hip is stable and the dressing shows only scant bloody drainage.  Physical therapy is work with the patient and cleared her for discharge to home.  Her back pain was significantly an issue yesterday as well as nausea and dizziness but that is improved.  Her biggest complaint has been spasms in her right thigh which is to be expected and some buttock pain which is to be expected.  We do feel comfortable discharging her home today and the family is comfortable with that as well and the patient overall has improved and looks good.

## 2024-04-17 NOTE — Plan of Care (Signed)
  Problem: Education: Goal: Knowledge of General Education information will improve Description: Including pain rating scale, medication(s)/side effects and non-pharmacologic comfort measures Outcome: Completed/Met   Problem: Health Behavior/Discharge Planning: Goal: Ability to manage health-related needs will improve Outcome: Adequate for Discharge   Problem: Clinical Measurements: Goal: Ability to maintain clinical measurements within normal limits will improve Outcome: Progressing Goal: Will remain free from infection Outcome: Progressing Goal: Diagnostic test results will improve Outcome: Progressing Goal: Respiratory complications will improve Outcome: Progressing Goal: Cardiovascular complication will be avoided Outcome: Progressing   Problem: Activity: Goal: Risk for activity intolerance will decrease Outcome: Adequate for Discharge   Problem: Nutrition: Goal: Adequate nutrition will be maintained Outcome: Progressing   Problem: Coping: Goal: Level of anxiety will decrease Outcome: Progressing   Problem: Elimination: Goal: Will not experience complications related to bowel motility Outcome: Progressing Goal: Will not experience complications related to urinary retention Outcome: Completed/Met   Problem: Pain Managment: Goal: General experience of comfort will improve and/or be controlled Outcome: Progressing   Problem: Safety: Goal: Ability to remain free from injury will improve Outcome: Progressing   Problem: Skin Integrity: Goal: Risk for impaired skin integrity will decrease Outcome: Adequate for Discharge   Problem: Education: Goal: Knowledge of the prescribed therapeutic regimen will improve Outcome: Progressing Goal: Understanding of discharge needs will improve Outcome: Progressing Goal: Individualized Educational Video(s) Outcome: Completed/Met   Problem: Clinical Measurements: Goal: Postoperative complications will be avoided or  minimized Outcome: Progressing   Problem: Pain Management: Goal: Pain level will decrease with appropriate interventions Outcome: Adequate for Discharge   Problem: Skin Integrity: Goal: Will show signs of wound healing Outcome: Progressing

## 2024-04-17 NOTE — Discharge Summary (Signed)
 Patient ID: Deborah Mcconnell MRN: [id number] DOB/AGE: [birth year] 72 y.o.  Admit date: 04/15/2024 Discharge date: 04/17/2024  Admission Diagnoses:  Principal Problem:   Unilateral primary osteoarthritis, right hip Active Problems:   Status post total replacement of right hip   Discharge Diagnoses:  Same  Past Medical History:  Diagnosis Date   Allergy    Arthritis    osteoarthritis   Breast cancer, left (HCC) 2014   Hx Lumpectomy and Rad tx's.   Heart palpitations    Hyperlipidemia    Osteopenia    previously taking Evista and Actonel   Osteoporosis    Palpitations    Pneumonia    PONV (postoperative nausea and vomiting)    Vertigo     Surgeries: Procedure(s): ARTHROPLASTY, HIP, TOTAL, ANTERIOR APPROACH on 04/15/2024   Consultants:   Discharged Condition: Improved  Hospital Course: Deborah Mcconnell is an 73 y.o. female who was admitted 04/15/2024 for operative treatment ofUnilateral primary osteoarthritis, right hip. Patient has severe unremitting pain that affects sleep, daily activities, and work/hobbies. After pre-op clearance the patient was taken to the operating room on 04/15/2024 and underwent  Procedure(s): ARTHROPLASTY, HIP, TOTAL, ANTERIOR APPROACH.    Patient was given perioperative antibiotics:  Anti-infectives (From admission, onward)    Start     Dose/Rate Route Frequency Ordered Stop   04/15/24 1830  ceFAZolin  (ANCEF ) IVPB 2g/100 mL premix        2 g 200 mL/hr over 30 Minutes Intravenous Every 6 hours 04/15/24 1529 04/16/24 0036   04/15/24 1015  ceFAZolin  (ANCEF ) IVPB 2g/100 mL premix        2 g 200 mL/hr over 30 Minutes Intravenous On call to O.R. 04/15/24 1009 04/15/24 1226        Patient was given sequential compression devices, early ambulation, and chemoprophylaxis to prevent DVT.  Inpatient Morphine Milligram Equivalents Per Day 10/17 - 10/19   Values displayed are in units of MME/Day    Order Start / End Date 10/17 Yesterday  Today    oxyCODONE (Oxy IR/ROXICODONE) immediate release tablet 5 mg 10/17 - 10/17 0 of Unknown -- --    oxyCODONE (ROXICODONE) 5 MG/5ML solution 5 mg 10/17 - 10/17 0 of Unknown -- --      Group total: 0 of Unknown      fentaNYL  (SUBLIMAZE ) injection 25-50 mcg 10/17 - 10/17 15 of 45-90 -- --    HYDROcodone -acetaminophen  (NORCO/VICODIN) 5-325 MG per tablet 1-2 tablet 10/17 - 10/18 0 of 15-30 0 of 15-30 --    HYDROcodone -acetaminophen  (NORCO) 7.5-325 MG per tablet 1-2 tablet 10/17 - 10/18 0 of 22.5-45 0 of 22.5-45 --    morphine (PF) 2 MG/ML injection 0.5-1 mg 10/17 - No end date 0 of 7.5-15 0 of 18-36 0 of 18-36    traMADol  (ULTRAM ) tablet 50 mg 10/17 - 10/18 10 of 5 5 of 10 --    traMADol  (ULTRAM ) tablet 50-100 mg 10/18 - No end date -- 5 of 15-30 0 of 20-40    HYDROcodone -acetaminophen  (NORCO/VICODIN) 5-325 MG per tablet 1-2 tablet 10/18 - No end date -- 5 of 20-40 5 of 30-60    Daily Totals  25 of Unknown (at least 95-185) 15 of 100.5-191 5 of 68-136    Calculation Errors     Order Type Date Details   oxyCODONE (Oxy IR/ROXICODONE) immediate release tablet 5 mg Ordered Dose -- Insufficient frequency information   oxyCODONE (ROXICODONE) 5 MG/5ML solution 5 mg Ordered Dose -- Insufficient frequency information  Patient benefited maximally from hospital stay and there were no complications.    Recent vital signs: Patient Vitals for the past 24 hrs:  BP Temp Temp src Pulse Resp SpO2  04/17/24 0531 118/80 98.6 F (37 C) Oral 86 17 98 %  04/16/24 2120 (!) 110/58 98.3 F (36.8 C) Oral -- 17 99 %  04/16/24 1358 (!) 115/53 98.4 F (36.9 C) Oral 68 17 99 %  04/16/24 1145 122/63 -- -- 68 -- --     Recent laboratory studies:  Recent Labs    04/16/24 0330  WBC 8.5  HGB 11.9*  HCT 36.2  PLT 173  NA 140  K 3.7  CL 106  CO2 26  BUN 8  CREATININE 0.58  GLUCOSE 142*  CALCIUM 8.4*     Discharge Medications:   Allergies as of 04/17/2024       Reactions    Ciprofloxacin  Anaphylaxis   Causes death   Ofloxacin Anaphylaxis   Causes Death Other reaction(s): Not available   Quinolones Anaphylaxis   Actonel [risedronate]    Stomach pain   Augmentin  [amoxicillin -pot Clavulanate] Nausea And Vomiting   Other Other (See Comments)   Bactrim  [sulfamethoxazole -trimethoprim ] Hives   Itching, hives, and GI distress        Medication List     TAKE these medications    aspirin 81 MG chewable tablet Chew 1 tablet (81 mg total) by mouth 2 (two) times daily.   CALCIUM CITRATE + D3 PO Take 1 tablet by mouth in the morning.   cholecalciferol 25 MCG (1000 UNIT) tablet Commonly known as: VITAMIN D3 Take 1,000 Units by mouth in the morning.   CRANBERRY PO Take 1 tablet by mouth in the morning.   diclofenac  sodium 1 % Gel Commonly known as: VOLTAREN  Apply 4 g topically 4 (four) times daily. What changed:  when to take this reasons to take this   HYDROcodone -acetaminophen  5-325 MG tablet Commonly known as: NORCO/VICODIN Take 1-2 tablets by mouth every 6 (six) hours as needed for severe pain (pain score 7-10). No more than 6 tablets daily   naproxen 500 MG tablet Commonly known as: NAPROSYN Take 500 mg by mouth 2 (two) times daily as needed (pain.).   ondansetron  4 MG disintegrating tablet Commonly known as: ZOFRAN -ODT Take 1 tablet (4 mg total) by mouth every 8 (eight) hours as needed.   PROBIOTIC PO Take 1 capsule by mouth in the morning.   SALONPAS PAIN RELIEF PATCH EX Place 1 patch onto the skin daily as needed (pain.).   simvastatin  40 MG tablet Commonly known as: ZOCOR  TAKE 1 TABLET BY MOUTH AT BEDTIME   tiZANidine 4 MG tablet Commonly known as: Zanaflex Take 1 tablet (4 mg total) by mouth every 6 (six) hours as needed for muscle spasms.   VITAMIN B COMPLEX PO Take 1 capsule by mouth in the morning.   vitamin C 1000 MG tablet Take 1,000 mg by mouth daily.               Durable Medical Equipment  (From  admission, onward)           Start     Ordered   04/15/24 1530  DME 3 n 1  Once        04/15/24 1529   04/15/24 1530  DME Walker rolling  Once       Question Answer Comment  Walker: With 5 Inch Wheels   Patient needs a walker to treat with the following  condition Status post total replacement of right hip      04/15/24 1529            Diagnostic Studies: DG Pelvis Portable Result Date: 04/15/2024 CLINICAL DATA:  Status post right hip replacement. EXAM: PORTABLE PELVIS 1-2 VIEWS COMPARISON:  None Available. FINDINGS: Right hip arthroplasty in expected alignment. No periprosthetic lucency or fracture. Recent postsurgical change includes air and edema in the soft tissues. Overlying skin staples in place. IMPRESSION: Right hip arthroplasty without immediate postoperative complication. Electronically Signed   By: Andrea Gasman M.D.   On: 04/15/2024 15:40   DG HIP UNILAT WITH PELVIS 1V RIGHT Result Date: 04/15/2024 CLINICAL DATA:  Elective surgery. EXAM: DG HIP (WITH OR WITHOUT PELVIS) 1V RIGHT COMPARISON:  None Available. FINDINGS: Bowel fluoroscopic spot views of the pelvis and right hip obtained in the operating room. Images during hip arthroplasty. Fluoroscopy time 19 seconds. Dose 2.6907 mGy. IMPRESSION: Intraoperative fluoroscopy during right hip arthroplasty. Electronically Signed   By: Andrea Gasman M.D.   On: 04/15/2024 15:39   DG C-Arm 1-60 Min-No Report Result Date: 04/15/2024 Fluoroscopy was utilized by the requesting physician.  No radiographic interpretation.   DG C-Arm 1-60 Min-No Report Result Date: 04/15/2024 Fluoroscopy was utilized by the requesting physician.  No radiographic interpretation.    Disposition: Discharge disposition: 01-Home or Self Care          Follow-up Information     Vernetta Lonni GRADE, MD Follow up in 2 week(s).   Specialty: Orthopedic Surgery Contact information: [address] [place] [address] [phone]                   Signed: Lonni GRADE Vernetta 04/17/2024, 10:17 AM

## 2024-04-17 NOTE — Progress Notes (Signed)
 Physical Therapy Treatment Patient Details Name: Deborah Mcconnell MRN: [id number] DOB: 1951/04/22 Today's Date: 04/17/2024   History of Present Illness 73 yo female presents to therapy s/p R THA, anterior approach on 04/15/2024 due to failure of conservative measures. Pt PMH includes but is not limited to: liver cyst, OA, pain, vertigo, L breast ca, LBP and HLD.    PT Comments  Pt agreeable to working with therapy. Reports feeling much better today. Pain rated 2/3 with activity. Mild lightheadedness reported that did not limit her mobility. Tolerated session well. Encouraged pt to ambulate often at home. Plan is for HHPT f/u. All PT education completed.    If plan is discharge home, recommend the following: A little help with walking and/or transfers;A little help with bathing/dressing/bathroom;Assistance with cooking/housework;Assist for transportation;Help with stairs or ramp for entrance   Can travel by private vehicle        Equipment Recommendations  None recommended by PT    Recommendations for Other Services       Precautions / Restrictions Precautions Precautions: Fall Restrictions Weight Bearing Restrictions Per Provider Order: No RLE Weight Bearing Per Provider Order: Weight bearing as tolerated     Mobility  Bed Mobility Overal bed mobility: Needs Assistance Bed Mobility: Supine to Sit, Sit to Supine     Supine to sit: Contact guard, HOB elevated, Used rails Sit to supine: Contact guard assist, HOB elevated, Used rails   General bed mobility comments: Pt practiced using gait belt to assist R LE off/onto bed. Cues for technique.    Transfers Overall transfer level: Needs assistance Equipment used: Rolling walker (2 wheels) Transfers: Sit to/from Stand Sit to Stand: Contact guard assist, From elevated surface           General transfer comment: Cues for safety, technique, hand/LE placement.    Ambulation/Gait Ambulation/Gait assistance: Contact  guard assist Gait Distance (Feet): 125 Feet Assistive device: Rolling walker (2 wheels) Gait Pattern/deviations: Step-through pattern       General Gait Details: Pt tolerated distance well. No LOB with RW use.   Stairs Stairs: Yes Stairs assistance: Contact guard assist Stair Management: Sideways, One rail Right, Step to pattern Number of Stairs: 2 General stair comments: Up and over portable stairs x 1. Cues for safety, technique, sequencing.   Wheelchair Mobility     Tilt Bed    Modified Rankin (Stroke Patients Only)       Balance Overall balance assessment: Needs assistance         Standing balance support: Bilateral upper extremity supported, Reliant on assistive device for balance, During functional activity Standing balance-Leahy Scale: Fair                              Hotel manager: No apparent difficulties  Cognition Arousal: Alert Behavior During Therapy: WFL for tasks assessed/performed   PT - Cognitive impairments: No apparent impairments                         Following commands: Intact      Cueing Cueing Techniques: Verbal cues  Exercises Total Joint Exercises Ankle Circles/Pumps: AROM, Both, 10 reps Quad Sets: AROM, Both, 10 reps Heel Slides: AAROM, Right, 10 reps Hip ABduction/ADduction: AAROM, Right, 10 reps    General Comments        Pertinent Vitals/Pain Pain Assessment Pain Assessment: No/denies pain Pain Score: 3  Pain Location: R hip/thigh Pain  Descriptors / Indicators: Aching, Grimacing, Operative site guarding, Stabbing, Sharp Pain Intervention(s): Limited activity within patient's tolerance, Monitored during session, Repositioned    Home Living                          Prior Function            PT Goals (current goals can now be found in the care plan section) Progress towards PT goals: Progressing toward goals    Frequency    7X/week      PT  Plan      Co-evaluation              AM-PAC PT 6 Clicks Mobility   Outcome Measure  Help needed turning from your back to your side while in a flat bed without using bedrails?: None Help needed moving from lying on your back to sitting on the side of a flat bed without using bedrails?: None Help needed moving to and from a bed to a chair (including a wheelchair)?: None Help needed standing up from a chair using your arms (e.g., wheelchair or bedside chair)?: None Help needed to walk in hospital room?: A Little Help needed climbing 3-5 steps with a railing? : A Little 6 Click Score: 22    End of Session Equipment Utilized During Treatment: Gait belt Activity Tolerance: Patient tolerated treatment well Patient left: in bed;with call bell/phone within reach;with bed alarm set;with family/visitor present   PT Visit Diagnosis: Unsteadiness on feet (R26.81);Other abnormalities of gait and mobility (R26.89);Muscle weakness (generalized) (M62.81);Difficulty in walking, not elsewhere classified (R26.2);Pain Pain - Right/Left: Right Pain - part of body: Hip     Time: 9083-9060 PT Time Calculation (min) (ACUTE ONLY): 23 min  Charges:    $Gait Training: 8-22 mins $Therapeutic Exercise: 8-22 mins PT General Charges $$ ACUTE PT VISIT: 1 Visit                         Dannial SQUIBB, PT Acute Rehabilitation  Office: [phone]

## 2024-04-18 ENCOUNTER — Encounter (HOSPITAL_COMMUNITY): Payer: Self-pay | Admitting: Orthopaedic Surgery

## 2024-04-19 ENCOUNTER — Telehealth: Payer: Self-pay | Admitting: Orthopaedic Surgery

## 2024-04-19 DIAGNOSIS — Z9049 Acquired absence of other specified parts of digestive tract: Secondary | ICD-10-CM | POA: Diagnosis not present

## 2024-04-19 DIAGNOSIS — Z9181 History of falling: Secondary | ICD-10-CM | POA: Diagnosis not present

## 2024-04-19 DIAGNOSIS — Z5982 Transportation insecurity: Secondary | ICD-10-CM | POA: Diagnosis not present

## 2024-04-19 DIAGNOSIS — Z471 Aftercare following joint replacement surgery: Secondary | ICD-10-CM | POA: Diagnosis not present

## 2024-04-19 DIAGNOSIS — B351 Tinea unguium: Secondary | ICD-10-CM | POA: Diagnosis not present

## 2024-04-19 DIAGNOSIS — M8580 Other specified disorders of bone density and structure, unspecified site: Secondary | ICD-10-CM | POA: Diagnosis not present

## 2024-04-19 DIAGNOSIS — Z7982 Long term (current) use of aspirin: Secondary | ICD-10-CM | POA: Diagnosis not present

## 2024-04-19 DIAGNOSIS — I700 Atherosclerosis of aorta: Secondary | ICD-10-CM | POA: Diagnosis not present

## 2024-04-19 DIAGNOSIS — E785 Hyperlipidemia, unspecified: Secondary | ICD-10-CM | POA: Diagnosis not present

## 2024-04-19 DIAGNOSIS — Z8616 Personal history of COVID-19: Secondary | ICD-10-CM | POA: Diagnosis not present

## 2024-04-19 DIAGNOSIS — I479 Paroxysmal tachycardia, unspecified: Secondary | ICD-10-CM | POA: Diagnosis not present

## 2024-04-19 DIAGNOSIS — K7689 Other specified diseases of liver: Secondary | ICD-10-CM | POA: Diagnosis not present

## 2024-04-19 DIAGNOSIS — M810 Age-related osteoporosis without current pathological fracture: Secondary | ICD-10-CM | POA: Diagnosis not present

## 2024-04-19 DIAGNOSIS — Z96641 Presence of right artificial hip joint: Secondary | ICD-10-CM | POA: Diagnosis not present

## 2024-04-19 DIAGNOSIS — Z853 Personal history of malignant neoplasm of breast: Secondary | ICD-10-CM | POA: Diagnosis not present

## 2024-04-19 DIAGNOSIS — Z8701 Personal history of pneumonia (recurrent): Secondary | ICD-10-CM | POA: Diagnosis not present

## 2024-04-19 DIAGNOSIS — J40 Bronchitis, not specified as acute or chronic: Secondary | ICD-10-CM | POA: Diagnosis not present

## 2024-04-19 NOTE — Telephone Encounter (Signed)
 [facility] called with verbal orders for  PT 2 wk 1, 3 wk 2, and  1 wk 1. CB#[phone]

## 2024-04-19 NOTE — Telephone Encounter (Signed)
 Carla from Dr. Randine Bohr office called wanting the pt's pre med protocol for dental work signed and faxed to them. Fax number is [phone]

## 2024-04-19 NOTE — Telephone Encounter (Signed)
 Verbal order given

## 2024-04-20 NOTE — Telephone Encounter (Signed)
 Dental note faxed to provided number

## 2024-04-22 DIAGNOSIS — J40 Bronchitis, not specified as acute or chronic: Secondary | ICD-10-CM | POA: Diagnosis not present

## 2024-04-22 DIAGNOSIS — I700 Atherosclerosis of aorta: Secondary | ICD-10-CM | POA: Diagnosis not present

## 2024-04-22 DIAGNOSIS — Z471 Aftercare following joint replacement surgery: Secondary | ICD-10-CM | POA: Diagnosis not present

## 2024-04-22 DIAGNOSIS — M810 Age-related osteoporosis without current pathological fracture: Secondary | ICD-10-CM | POA: Diagnosis not present

## 2024-04-22 DIAGNOSIS — K7689 Other specified diseases of liver: Secondary | ICD-10-CM | POA: Diagnosis not present

## 2024-04-22 DIAGNOSIS — I479 Paroxysmal tachycardia, unspecified: Secondary | ICD-10-CM | POA: Diagnosis not present

## 2024-04-26 DIAGNOSIS — I700 Atherosclerosis of aorta: Secondary | ICD-10-CM | POA: Diagnosis not present

## 2024-04-26 DIAGNOSIS — I479 Paroxysmal tachycardia, unspecified: Secondary | ICD-10-CM | POA: Diagnosis not present

## 2024-04-26 DIAGNOSIS — Z471 Aftercare following joint replacement surgery: Secondary | ICD-10-CM | POA: Diagnosis not present

## 2024-04-26 DIAGNOSIS — M810 Age-related osteoporosis without current pathological fracture: Secondary | ICD-10-CM | POA: Diagnosis not present

## 2024-04-26 DIAGNOSIS — J40 Bronchitis, not specified as acute or chronic: Secondary | ICD-10-CM | POA: Diagnosis not present

## 2024-04-26 DIAGNOSIS — K7689 Other specified diseases of liver: Secondary | ICD-10-CM | POA: Diagnosis not present

## 2024-04-28 ENCOUNTER — Encounter: Payer: Self-pay | Admitting: Orthopaedic Surgery

## 2024-04-28 ENCOUNTER — Ambulatory Visit: Admitting: Orthopaedic Surgery

## 2024-04-28 DIAGNOSIS — Z96641 Presence of right artificial hip joint: Secondary | ICD-10-CM

## 2024-04-28 NOTE — Progress Notes (Signed)
 The patient is a 73 year old who is here today for her first postoperative visit status post a right total hip replacement.  She is ambulate with a walker and occasional pain but feels like she can wean herself from these.  She does feel like there is a slight leg length difference as she is putting a lift in her left shoe.  She is only taking Tylenol  for pain.  Her right hip incision looks good the staples been removed and Steri-Strips applied.  When I have her lay supine I believe her leg lengths are equal.  However we will certainly listen to what she feels.  Overall though she looks good.  She can stop her baby aspirin twice daily.  She can drive when she is comfortable from our standpoint.  She will hold off submerging in water  for at least 2-3 more weeks.  Will see her back in a month to see how she is doing overall and to reassess her leg lengths and to look at her mobility but no x-rays are needed.

## 2024-04-29 DIAGNOSIS — K7689 Other specified diseases of liver: Secondary | ICD-10-CM | POA: Diagnosis not present

## 2024-04-29 DIAGNOSIS — I700 Atherosclerosis of aorta: Secondary | ICD-10-CM | POA: Diagnosis not present

## 2024-04-29 DIAGNOSIS — Z471 Aftercare following joint replacement surgery: Secondary | ICD-10-CM | POA: Diagnosis not present

## 2024-04-29 DIAGNOSIS — M810 Age-related osteoporosis without current pathological fracture: Secondary | ICD-10-CM | POA: Diagnosis not present

## 2024-04-29 DIAGNOSIS — I479 Paroxysmal tachycardia, unspecified: Secondary | ICD-10-CM | POA: Diagnosis not present

## 2024-04-29 DIAGNOSIS — J40 Bronchitis, not specified as acute or chronic: Secondary | ICD-10-CM | POA: Diagnosis not present

## 2024-05-01 NOTE — Progress Notes (Deleted)
 05/03/2024 4:49 PM   Rock Pica Marbach [birth year] [id number]  Referring provider: Dineen Rollene MATSU, FNP [address] [place],  [place] [address]  Urological history: 1. 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 High risk hematuria - non-smoker - hematuria work up x 2 - NED - bladder biopsy (03/2022) - mild submucosal hyperemia  No chief complaint on file.  HPI: Deborah Mcconnell is a 73 y.o. woman who presents today for yearly follow up.   Previous records reviewed.   They are having (1 to 7) or (8 or more) daytime voids,  they are having nocturia (1-2) or (3 or more) and urgency is (none, mild, strong, severe).   They are having (stress, urge or mixed incontinence.)    they are having urinary leakage (1-2 times weekly, 3 or more times weekly, 1-2 times daily and 3 or more times daily) They are using absorbent products for leakage (no, sometimes, always )   the type of products they use are (panty liners, absorbant pads, depends) *** daily.  They are not limiting fluids.  They are not engaging in toilet mapping  ***  UA ***  Serum creatinine (03/2024) 0.58, eGFR > 60   PMH: Past Medical History:  Diagnosis Date   Allergy    Arthritis    osteoarthritis   Breast cancer, left (HCC) 2014   Hx Lumpectomy and Rad tx's.   Heart palpitations    Hyperlipidemia    Osteopenia    previously taking Evista and Actonel   Osteoporosis    Palpitations    Pneumonia    PONV (postoperative nausea and vomiting)    Vertigo     Surgical History: Past Surgical History:  Procedure Laterality Date   BREAST SURGERY  June, 2014   CHOLECYSTECTOMY     COLONOSCOPY     CYSTOSCOPY W/ RETROGRADES Bilateral 04/28/2022   Procedure: CYSTOSCOPY WITH RETROGRADE PYELOGRAM;  Surgeon: Penne Knee, MD;  Location: [facility];  Service: Urology;  Laterality: Bilateral;   CYSTOSCOPY WITH BIOPSY N/A 04/28/2022   Procedure: CYSTOSCOPY WITH BLADDER BIOPSY;  Surgeon: Penne Knee, MD;  Location: [facility];  Service: Urology;  Laterality: N/A;   EYE SURGERY Bilateral 2022   cataract   lt breast lumpectomy  2014   TOTAL HIP ARTHROPLASTY Right 04/15/2024   Procedure: ARTHROPLASTY, HIP, TOTAL, ANTERIOR APPROACH;  Surgeon: Vernetta Lonni GRADE, MD;  Location: [facility];  Service: Orthopedics;  Laterality: Right;    Home Medications:  Allergies as of 05/03/2024       Reactions   Ciprofloxacin  Anaphylaxis   Causes death   Ofloxacin Anaphylaxis   Causes Death Other reaction(s): Not available   Quinolones Anaphylaxis   Actonel [risedronate]    Stomach pain   Augmentin  [amoxicillin -pot Clavulanate] Nausea And Vomiting   Other Other (See Comments)   Bactrim  [sulfamethoxazole -trimethoprim ] Hives   Itching, hives, and GI distress        Medication List        Accurate as of May 01, 2024  4:49 PM. If you have any questions, ask your nurse or doctor.          aspirin 81 MG chewable tablet Chew 1 tablet (81 mg total) by mouth 2 (two) times daily.   CALCIUM CITRATE + D3 PO Take 1 tablet by mouth in the morning.   cholecalciferol 25 MCG (1000 UNIT) tablet Commonly known as: VITAMIN D3 Take 1,000 Units by mouth in the morning.   CRANBERRY PO  Take 1 tablet by mouth in the morning.   diclofenac  sodium 1 % Gel Commonly known as: VOLTAREN  Apply 4 g topically 4 (four) times daily. What changed:  when to take this reasons to take this   HYDROcodone -acetaminophen  5-325 MG tablet Commonly known as: NORCO/VICODIN Take 1-2 tablets by mouth every 6 (six) hours as needed for severe pain (pain score 7-10). No more than 6 tablets daily   naproxen 500 MG tablet Commonly known as: NAPROSYN Take 500 mg by mouth 2 (two) times daily as needed (pain.).   ondansetron  4 MG disintegrating tablet Commonly known as: ZOFRAN -ODT Take 1 tablet (4 mg total) by mouth every 8 (eight) hours as needed.   PROBIOTIC PO Take 1 capsule by mouth in the morning.    SALONPAS PAIN RELIEF PATCH EX Place 1 patch onto the skin daily as needed (pain.).   simvastatin  40 MG tablet Commonly known as: ZOCOR  TAKE 1 TABLET BY MOUTH AT BEDTIME   tiZANidine 4 MG tablet Commonly known as: Zanaflex Take 1 tablet (4 mg total) by mouth every 6 (six) hours as needed for muscle spasms.   VITAMIN B COMPLEX PO Take 1 capsule by mouth in the morning.   vitamin C 1000 MG tablet Take 1,000 mg by mouth daily.        Allergies:  Allergies  Allergen Reactions   Ciprofloxacin  Anaphylaxis    Causes death   Ofloxacin Anaphylaxis    Causes Death Other reaction(s): Not available   Quinolones Anaphylaxis   Actonel [Risedronate]     Stomach pain   Augmentin  [Amoxicillin -Pot Clavulanate] Nausea And Vomiting   Other Other (See Comments)   Bactrim  [Sulfamethoxazole -Trimethoprim ] Hives    Itching, hives, and GI distress    Family History: Family History  Problem Relation Age of Onset   Osteoporosis Mother    Hypertension Mother    Osteoarthritis Mother    Arthritis Mother    COPD Father    Osteoporosis Sister    Osteoarthritis Sister    Osteoarthritis Maternal Grandmother    Diabetes Maternal Grandfather    Cancer Paternal Grandfather        unknown   Cancer Maternal Aunt    Diabetes Maternal Aunt    Cancer Maternal Aunt    Diabetes Maternal Aunt    Prostate cancer Neg Hx    Bladder Cancer Neg Hx    Kidney cancer Neg Hx    CAD Neg Hx    CVA Neg Hx     Social History: See HPI for pertinent social history  ROS: Pertinent ROS in HPI  Physical Exam: There were no vitals taken for this visit.  Constitutional:  Well nourished. Alert and oriented, No acute distress. HEENT: [place] AT, moist mucus membranes.  Trachea midline, no masses. Cardiovascular: No clubbing, cyanosis, or edema. Respiratory: Normal respiratory effort, no increased work of breathing. GU: No CVA tenderness.  No bladder fullness or masses.  Recession of labia minora, dry, pale  vulvar vaginal mucosa and loss of mucosal ridges and folds.  Normal urethral meatus, no lesions, no prolapse, no discharge.   No urethral masses, tenderness and/or tenderness. No bladder fullness, tenderness or masses. *** vagina mucosa, *** estrogen effect, no discharge, no lesions, *** pelvic support, *** cystocele and *** rectocele noted.  No cervical motion tenderness.  Uterus is freely mobile and non-fixed.  No adnexal/parametria masses or tenderness noted.  Anus and perineum are without rashes or lesions.   ***  Neurologic: Grossly intact, no focal deficits, moving  all 4 extremities. Psychiatric: Normal mood and affect.    Laboratory Data: See EPIC and HPI  I have reviewed the labs   Pertinent Imaging: N/A  Assessment & Plan:    1. rUTI's  - criteria for recurrent UTI has been met with 2 or more infections in 6 months or 3 or greater infections in one year ***            - vaginal estrogen cream is first-line therapy for postmenopausal women ***  - patient is instructed to increase their water  intake until the urine is pale yellow or clear (10 to 12 cups daily) ***  - taking probiotics that include  Lactobacillus crispatus in premenopausal women and oral capsules with Lactobacillus rhamnosus GR-1 and Lactobacillus reuteri RC-14 in postmenopausal women ***  - Hiprex 1 gram twice daily if creatinine clearance is > 30 ***  - could consider D-mannose or cranberry products, but evidence is weak with contradictory findings ***  - address constipation issues ***   2. Genitourinary Syndrome of Menopause (GSM)  - Explained that GSM is a common condition that affects women during and after menopause. It is characterized by a cluster of symptoms related to the genital and urinary tracts, including: vaginal dryness, pain or discomfort during intercourse (dyspareunia), burning or irritation in the vulva or vagina, thinning or loss of vaginal tissue, frequent urination, urgency (feeling the need to  urinate immediately), incontinence (loss of bladder control), and pain or burning during urination - explained that GSM is primarily caused by the decline in estrogen levels during menopause. This leads to changes in the vaginal tissue, making it thinner, drier, and more susceptible to irritation. Other factors that may contribute to GSM include: Smoking, certain medications (e.g., chemotherapy drugs, and pelvic organ prolapse - advised that First-line treatment options are: Local low-dose vaginal estrogen (cream, ring, tablet), which improves dryness, irritation, dyspareunia and it is safe for most patients, including those with history of breast cancer (with multidisciplinary input). - advised they can also use vaginal moisturizers and lubricants (alone or combined) and avoid irritants and harsh cleansers.  - - - Pelvic floor physical therapy for dysfunction is also helpful. *** - Referral to sex therapy or counseling if psychosocial concerns are present. *** - Reassured that there is no evidence linking local estrogen to breast or endometrial cancer - Explained that it may take up to 8 months of consistent application of the vaginal estrogen cream to cause unnecessary changes needed to address GSM and that this will be a lifelong medication - Will start with vaginal estrogen cream, applying a pea-sized amount with a fingertip just inside the vaginal introitus every night for 30 days and then continuing on to Monday, Wednesday and Friday nights - estradiol (ESTRACE) 0.01 % CREA vaginal cream, Apply one pea-sized amount around the opening of the urethra daily for 30 days, then 3 times weekly moving forward, sent to pharmacy   3. History of high risk hematuria - UA *** - no reports of gross heme                               No follow-ups on file.  These notes generated with voice recognition software. I apologize for typographical errors.  CLOTILDA HELON RIGGERS  [facility] [address]  [address] [place], [place] [address] [phone]

## 2024-05-02 ENCOUNTER — Encounter: Payer: Self-pay | Admitting: Radiology

## 2024-05-03 ENCOUNTER — Ambulatory Visit: Payer: Medicare Other | Admitting: Urology

## 2024-05-03 DIAGNOSIS — Z471 Aftercare following joint replacement surgery: Secondary | ICD-10-CM | POA: Diagnosis not present

## 2024-05-03 DIAGNOSIS — I479 Paroxysmal tachycardia, unspecified: Secondary | ICD-10-CM | POA: Diagnosis not present

## 2024-05-03 DIAGNOSIS — K7689 Other specified diseases of liver: Secondary | ICD-10-CM | POA: Diagnosis not present

## 2024-05-03 DIAGNOSIS — M810 Age-related osteoporosis without current pathological fracture: Secondary | ICD-10-CM | POA: Diagnosis not present

## 2024-05-03 DIAGNOSIS — N390 Urinary tract infection, site not specified: Secondary | ICD-10-CM

## 2024-05-03 DIAGNOSIS — J40 Bronchitis, not specified as acute or chronic: Secondary | ICD-10-CM | POA: Diagnosis not present

## 2024-05-03 DIAGNOSIS — R319 Hematuria, unspecified: Secondary | ICD-10-CM

## 2024-05-03 DIAGNOSIS — N958 Other specified menopausal and perimenopausal disorders: Secondary | ICD-10-CM

## 2024-05-03 DIAGNOSIS — I700 Atherosclerosis of aorta: Secondary | ICD-10-CM | POA: Diagnosis not present

## 2024-05-05 DIAGNOSIS — J40 Bronchitis, not specified as acute or chronic: Secondary | ICD-10-CM | POA: Diagnosis not present

## 2024-05-05 DIAGNOSIS — I479 Paroxysmal tachycardia, unspecified: Secondary | ICD-10-CM | POA: Diagnosis not present

## 2024-05-05 DIAGNOSIS — I700 Atherosclerosis of aorta: Secondary | ICD-10-CM | POA: Diagnosis not present

## 2024-05-05 DIAGNOSIS — Z471 Aftercare following joint replacement surgery: Secondary | ICD-10-CM | POA: Diagnosis not present

## 2024-05-05 DIAGNOSIS — M810 Age-related osteoporosis without current pathological fracture: Secondary | ICD-10-CM | POA: Diagnosis not present

## 2024-05-05 DIAGNOSIS — K7689 Other specified diseases of liver: Secondary | ICD-10-CM | POA: Diagnosis not present

## 2024-05-09 NOTE — Progress Notes (Unsigned)
 05/11/2024 9:44 AM   Deborah Mcconnell 1950/12/23 [id number]  Referring provider: Dineen Rollene MATSU, FNP [address] [place],  [place] [address]  Urological history: 1. 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 High risk hematuria - non-smoker - hematuria work up x 2 - NED - bladder biopsy (03/2022) - mild submucosal hyperemia  Chief Complaint  Patient presents with   Recurrent UTI   HPI: Deborah Mcconnell is a 73 y.o. woman who presents today for yearly follow up.   Previous records reviewed.   She is having 1-7 daytime voids, 1-2 episodes of nocturia, with a mild urge to urinate.  She does not have any urinary leakage therefore she wears no absorbent products for urinary leakage.  She does not limit fluid intake and she doesn't engage in toilet mapping  Patient denies any modifying or aggravating factors.  Patient denies any recent UTI's, gross hematuria, dysuria or suprapubic/flank pain.  Patient denies any fevers, chills, nausea or vomiting.    UA yellow clear, specific gravity 1.020, pH 6.0, trace leukocyte, 6-10 WBCs, 0-2 RBCs, greater than 10 epithelial cells, mucus threads are present and a few bacteria.  Serum creatinine (03/2024) 0.58, eGFR > 60  She is taking Keflex  250 mg after intercourse.    PVR 100 mL   She is not having any issues with vaginal dryness, vaginal irritation or vaginal itching.   PMH: Past Medical History:  Diagnosis Date   Allergy    Arthritis    osteoarthritis   Breast cancer, left (HCC) 2014   Hx Lumpectomy and Rad tx's.   Heart palpitations    Hyperlipidemia    Osteopenia    previously taking Evista and Actonel   Osteoporosis    Palpitations    Pneumonia    PONV (postoperative nausea and vomiting)    Vertigo     Surgical History: Past Surgical History:  Procedure Laterality Date   BREAST SURGERY  June, 2014   CHOLECYSTECTOMY     COLONOSCOPY     CYSTOSCOPY W/ RETROGRADES Bilateral 04/28/2022    Procedure: CYSTOSCOPY WITH RETROGRADE PYELOGRAM;  Surgeon: Penne Knee, MD;  Location: [facility];  Service: Urology;  Laterality: Bilateral;   CYSTOSCOPY WITH BIOPSY N/A 04/28/2022   Procedure: CYSTOSCOPY WITH BLADDER BIOPSY;  Surgeon: Penne Knee, MD;  Location: [facility];  Service: Urology;  Laterality: N/A;   EYE SURGERY Bilateral 2022   cataract   lt breast lumpectomy  2014   TOTAL HIP ARTHROPLASTY Right 04/15/2024   Procedure: ARTHROPLASTY, HIP, TOTAL, ANTERIOR APPROACH;  Surgeon: Vernetta Lonni GRADE, MD;  Location: [facility];  Service: Orthopedics;  Laterality: Right;    Home Medications:  Allergies as of 05/11/2024       Reactions   Ciprofloxacin  Anaphylaxis   Causes death   Ofloxacin Anaphylaxis   Causes Death Other reaction(s): Not available   Quinolones Anaphylaxis   Actonel [risedronate]    Stomach pain   Augmentin  [amoxicillin -pot Clavulanate] Nausea And Vomiting   Other Other (See Comments)   Bactrim  [sulfamethoxazole -trimethoprim ] Hives   Itching, hives, and GI distress        Medication List        Accurate as of May 11, 2024  9:44 AM. If you have any questions, ask your nurse or doctor.          aspirin 81 MG chewable tablet Chew 1 tablet (81 mg total) by mouth 2 (two) times daily.   CALCIUM CITRATE + D3 PO  Take 1 tablet by mouth in the morning.   cephALEXin  250 MG capsule Commonly known as: KEFLEX  Take 1 capsule (250 mg total) by mouth daily.   cholecalciferol 25 MCG (1000 UNIT) tablet Commonly known as: VITAMIN D3 Take 1,000 Units by mouth in the morning.   CRANBERRY PO Take 1 tablet by mouth in the morning.   diclofenac  sodium 1 % Gel Commonly known as: VOLTAREN  Apply 4 g topically 4 (four) times daily. What changed:  when to take this reasons to take this   HYDROcodone -acetaminophen  5-325 MG tablet Commonly known as: NORCO/VICODIN Take 1-2 tablets by mouth every 6 (six) hours as needed for severe pain (pain score  7-10). No more than 6 tablets daily   naproxen 500 MG tablet Commonly known as: NAPROSYN Take 500 mg by mouth 2 (two) times daily as needed (pain.).   ondansetron  4 MG disintegrating tablet Commonly known as: ZOFRAN -ODT Take 1 tablet (4 mg total) by mouth every 8 (eight) hours as needed.   PROBIOTIC PO Take 1 capsule by mouth in the morning.   SALONPAS PAIN RELIEF PATCH EX Place 1 patch onto the skin daily as needed (pain.).   simvastatin  40 MG tablet Commonly known as: ZOCOR  TAKE 1 TABLET BY MOUTH AT BEDTIME   tiZANidine 4 MG tablet Commonly known as: Zanaflex Take 1 tablet (4 mg total) by mouth every 6 (six) hours as needed for muscle spasms.   VITAMIN B COMPLEX PO Take 1 capsule by mouth in the morning.   vitamin C 1000 MG tablet Take 1,000 mg by mouth daily.        Allergies:  Allergies  Allergen Reactions   Ciprofloxacin  Anaphylaxis    Causes death   Ofloxacin Anaphylaxis    Causes Death Other reaction(s): Not available   Quinolones Anaphylaxis   Actonel [Risedronate]     Stomach pain   Augmentin  [Amoxicillin -Pot Clavulanate] Nausea And Vomiting   Other Other (See Comments)   Bactrim  [Sulfamethoxazole -Trimethoprim ] Hives    Itching, hives, and GI distress    Family History: Family History  Problem Relation Age of Onset   Osteoporosis Mother    Hypertension Mother    Osteoarthritis Mother    Arthritis Mother    COPD Father    Osteoporosis Sister    Osteoarthritis Sister    Osteoarthritis Maternal Grandmother    Diabetes Maternal Grandfather    Cancer Paternal Grandfather        unknown   Cancer Maternal Aunt    Diabetes Maternal Aunt    Cancer Maternal Aunt    Diabetes Maternal Aunt    Prostate cancer Neg Hx    Bladder Cancer Neg Hx    Kidney cancer Neg Hx    CAD Neg Hx    CVA Neg Hx     Social History: See HPI for pertinent social history  ROS: Pertinent ROS in HPI  Physical Exam: BP 139/61   Pulse 74   Wt 174 lb (78.9 kg)    BMI 24.27 kg/m   Constitutional:  Well nourished. Alert and oriented, No acute distress. HEENT: [place] AT, moist mucus membranes.  Trachea midline Cardiovascular: No clubbing, cyanosis, or edema. Respiratory: Normal respiratory effort, no increased work of breathing. Neurologic: Grossly intact, no focal deficits, moving all 4 extremities. Psychiatric: Normal mood and affect.    Laboratory Data: See EPIC and HPI  I have reviewed the labs   Pertinent Imaging: N/A  Assessment & Plan:    1. rUTI's -She is doing well  with Keflex  postcoitally -Refill sent to pharmacy   2. Genitourinary Syndrome of Menopause (GSM)  - Explained that GSM is a common condition that affects women during and after menopause. It is characterized by a cluster of symptoms related to the genital and urinary tracts, including: vaginal dryness, pain or discomfort during intercourse (dyspareunia), burning or irritation in the vulva or vagina, thinning or loss of vaginal tissue, frequent urination, urgency (feeling the need to urinate immediately), incontinence (loss of bladder control), and pain or burning during urination - she denied any symptoms of GSM at this time  3. History of high risk hematuria - UA negative for microscopic hematuria  - no reports of gross heme                       Return in about 1 year (around 05/11/2025) for UA, OAB questionnaire and PVR.  These notes generated with voice recognition software. I apologize for typographical errors.  CLOTILDA HELON RIGGERS  [facility] [address]  [address] [place], [place] [address] [phone]

## 2024-05-11 ENCOUNTER — Ambulatory Visit (INDEPENDENT_AMBULATORY_CARE_PROVIDER_SITE_OTHER): Admitting: Urology

## 2024-05-11 ENCOUNTER — Encounter: Payer: Self-pay | Admitting: Urology

## 2024-05-11 VITALS — BP 139/61 | HR 74 | Wt 174.0 lb

## 2024-05-11 DIAGNOSIS — N958 Other specified menopausal and perimenopausal disorders: Secondary | ICD-10-CM | POA: Diagnosis not present

## 2024-05-11 DIAGNOSIS — R319 Hematuria, unspecified: Secondary | ICD-10-CM

## 2024-05-11 DIAGNOSIS — M810 Age-related osteoporosis without current pathological fracture: Secondary | ICD-10-CM | POA: Diagnosis not present

## 2024-05-11 DIAGNOSIS — I700 Atherosclerosis of aorta: Secondary | ICD-10-CM | POA: Diagnosis not present

## 2024-05-11 DIAGNOSIS — N390 Urinary tract infection, site not specified: Secondary | ICD-10-CM | POA: Diagnosis not present

## 2024-05-11 DIAGNOSIS — K7689 Other specified diseases of liver: Secondary | ICD-10-CM | POA: Diagnosis not present

## 2024-05-11 DIAGNOSIS — I479 Paroxysmal tachycardia, unspecified: Secondary | ICD-10-CM | POA: Diagnosis not present

## 2024-05-11 DIAGNOSIS — Z471 Aftercare following joint replacement surgery: Secondary | ICD-10-CM | POA: Diagnosis not present

## 2024-05-11 DIAGNOSIS — J40 Bronchitis, not specified as acute or chronic: Secondary | ICD-10-CM | POA: Diagnosis not present

## 2024-05-11 LAB — MICROSCOPIC EXAMINATION: Epithelial Cells (non renal): >10 /HPF — AB (ref 0–10)

## 2024-05-11 LAB — URINALYSIS, COMPLETE
Bilirubin, UA: NEGATIVE
Glucose, UA: NEGATIVE
Ketones, UA: NEGATIVE
Nitrite, UA: NEGATIVE
Protein,UA: NEGATIVE
RBC, UA: NEGATIVE
Specific Gravity, UA: 1.02 (ref 1.005–1.030)
Urobilinogen, Ur: 0.2 mg/dL (ref 0.2–1.0)
pH, UA: 6 (ref 5.0–7.5)

## 2024-05-11 LAB — BLADDER SCAN AMB NON-IMAGING: Scan Result: 100

## 2024-05-11 MED ORDER — CEPHALEXIN 250 MG PO CAPS: 250.0000 mg | ORAL_CAPSULE | Freq: Every day | ORAL | 2 refills | Status: AC

## 2024-05-25 ENCOUNTER — Encounter: Payer: Self-pay | Admitting: Orthopaedic Surgery

## 2024-05-25 ENCOUNTER — Ambulatory Visit (INDEPENDENT_AMBULATORY_CARE_PROVIDER_SITE_OTHER): Admitting: Orthopaedic Surgery

## 2024-05-25 ENCOUNTER — Other Ambulatory Visit: Payer: Self-pay

## 2024-05-25 ENCOUNTER — Other Ambulatory Visit (INDEPENDENT_AMBULATORY_CARE_PROVIDER_SITE_OTHER): Payer: Self-pay

## 2024-05-25 DIAGNOSIS — Z96641 Presence of right artificial hip joint: Secondary | ICD-10-CM

## 2024-05-25 DIAGNOSIS — M47816 Spondylosis without myelopathy or radiculopathy, lumbar region: Secondary | ICD-10-CM

## 2024-05-25 DIAGNOSIS — G8929 Other chronic pain: Secondary | ICD-10-CM

## 2024-05-25 MED ORDER — METHYLPREDNISOLONE 4 MG PO TABS: ORAL_TABLET | ORAL | 0 refills | Status: DC

## 2024-05-25 NOTE — Progress Notes (Signed)
 The patient is an active 73 year old female who is now 6 weeks status post a right total hip arthroplasty.  The hip arthroplasty itself is doing well.  She does have some pain in the groin which is to be expected at 6 weeks recovering from the surgery.  However she has had an acute flareup of low back pain to the right side.  She has seen Dr. Eldonna in the past and in September of this year she did have right sided L4-L5 and L5-S1 looks like facet joint injections.  She states she has had a really significant flareup of pain and she points to the right side of her low back and upper pelvis area.  Certainly this could be SI joint related or facet joint related but has been quite painful for her.  Previous MRI in August of this year showed moderate foraminal stenosis at those levels.  Her right operative hip moves smoothly and fluidly with no pain at all.  An AP pelvis and lateral of the right hip shows a well-seated right total hip arthroplasty with no complicating features.  I will send in a steroid taper to hopefully get her through the holidays.  She does have tizanidine  at home which I have encouraged her to try.  We will also set her up an appointment to see Dr. Eldonna hopefully in the near future to consider repeat right sided L4-L5 and L5-S1 injections.  From my standpoint I will see her back in 6 weeks but no x-rays are needed.  She agrees with the treatment plan.  She is hoping to feel better for a long car trip to [place]  at the end of December.

## 2024-05-30 ENCOUNTER — Encounter: Payer: Self-pay | Admitting: Orthopaedic Surgery

## 2024-05-30 ENCOUNTER — Telehealth: Payer: Self-pay | Admitting: Physical Medicine and Rehabilitation

## 2024-05-30 NOTE — Telephone Encounter (Signed)
 Patient left voicemail message requesting a call to schedule an appointment. Pt states checking the status of the referral to see Dr Eldonna from Dr Vernetta, she's still having severe pain

## 2024-06-03 NOTE — Telephone Encounter (Signed)
 Checking with Deborah Mcconnell to see if availibility

## 2024-06-09 ENCOUNTER — Ambulatory Visit: Admitting: Physical Medicine and Rehabilitation

## 2024-06-09 ENCOUNTER — Other Ambulatory Visit: Payer: Self-pay

## 2024-06-09 VITALS — BP 161/80 | HR 94

## 2024-06-09 DIAGNOSIS — M47816 Spondylosis without myelopathy or radiculopathy, lumbar region: Secondary | ICD-10-CM | POA: Diagnosis not present

## 2024-06-09 MED ORDER — METHYLPREDNISOLONE ACETATE 40 MG/ML IJ SUSP
40.0000 mg | Freq: Once | INTRAMUSCULAR | Status: AC
  Administered 2024-06-09: 40 mg

## 2024-06-09 NOTE — Progress Notes (Unsigned)
 Pain Scale   Average Pain 3 Patient advising she has lower back pain radiating to right hip area, pain comes and goes        +Driver, -BT, -Dye Allergies.

## 2024-06-20 NOTE — Procedures (Signed)
 Lumbar Facet Joint Intra-Articular Injection(s) with Fluoroscopic Guidance  Patient: Deborah Mcconnell      Date of Birth: 05-11-1951 MRN: [id number] PCP: Dineen Rollene MATSU, FNP      Visit Date: 06/09/2024   Universal Protocol:    Date/Time: 06/09/2024  Consent Given By: the patient  Position: PRONE   Additional Comments: Vital signs were monitored before and after the procedure. Patient was prepped and draped in the usual sterile fashion. The correct patient, procedure, and site was verified.   Injection Procedure Details:  Procedure Site One Meds Administered:  Meds ordered this encounter  Medications   methylPREDNISolone  acetate (DEPO-MEDROL ) injection 40 mg     Laterality: Right  Location/Site:  L4-L5 L5-S1  Needle size: 22 guage  Needle type: Spinal  Needle Placement: Articular  Findings:  -Comments: Excellent flow of contrast producing a partial arthrogram.  Procedure Details: The fluoroscope beam is vertically oriented in AP, and the inferior recess is visualized beneath the lower pole of the inferior apophyseal process, which represents the target point for needle insertion. When direct visualization is difficult the target point is located at the medial projection of the vertebral pedicle. The region overlying each aforementioned target is locally anesthetized with a 1 to 2 ml. volume of 1% Lidocaine  without Epinephrine .   The spinal needle was inserted into each of the above mentioned facet joints using biplanar fluoroscopic guidance. A 0.25 to 0.5 ml. volume of Isovue -250 was injected and a partial facet joint arthrogram was obtained. A single spot film was obtained of the resulting arthrogram.    One to 1.25 ml of the steroid/anesthetic solution was then injected into each of the facet joints noted above.   Additional Comments:  The patient tolerated the procedure well Dressing: 2 x 2 sterile gauze and Band-Aid    Post-procedure details: Patient  was observed during the procedure. Post-procedure instructions were reviewed.  Patient left the clinic in stable condition.

## 2024-06-20 NOTE — Progress Notes (Signed)
 "  Deborah Mcconnell - 73 y.o. female MRN [id number]  Date of birth: Jul 25, 1950  Office Visit Note: Visit Date: 06/09/2024 PCP: Dineen Rollene MATSU, FNP Referred by: Dineen Rollene MATSU, FNP  Subjective: Chief Complaint  Patient presents with   Lower Back - Pain   HPI:  Deborah Mcconnell is a 73 y.o. female who comes in today at the request of Dr. Lonni Poli for planned Right  L4-5 and L5-S1 Lumbar facet/medial branch block with fluoroscopic guidance.  The patient has failed conservative care including home exercise, medications, time and activity modification.  This injection will be diagnostic and hopefully therapeutic.  Please see requesting physician notes for further details and justification.  Exam has shown concordant pain with facet joint loading.   ROS Otherwise per HPI.  Assessment & Plan: Visit Diagnoses:    ICD-10-CM   1. Spondylosis without myelopathy or radiculopathy, lumbar region  M47.816 XR C-ARM NO REPORT    Facet Injection    methylPREDNISolone  acetate (DEPO-MEDROL ) injection 40 mg      Plan: No additional findings.   Meds & Orders:  Meds ordered this encounter  Medications   methylPREDNISolone  acetate (DEPO-MEDROL ) injection 40 mg    Orders Placed This Encounter  Procedures   Facet Injection   XR C-ARM NO REPORT    Follow-up: Return for visit to requesting provider as needed.   Procedures: No procedures performed  Lumbar Facet Joint Intra-Articular Injection(s) with Fluoroscopic Guidance  Patient: Deborah Mcconnell      Date of Birth: 11/22/1950 MRN: [id number] PCP: Dineen Rollene MATSU, FNP      Visit Date: 06/09/2024   Universal Protocol:    Date/Time: 06/09/2024  Consent Given By: the patient  Position: PRONE   Additional Comments: Vital signs were monitored before and after the procedure. Patient was prepped and draped in the usual sterile fashion. The correct patient, procedure, and site was verified.   Injection  Procedure Details:  Procedure Site One Meds Administered:  Meds ordered this encounter  Medications   methylPREDNISolone  acetate (DEPO-MEDROL ) injection 40 mg     Laterality: Right  Location/Site:  L4-L5 L5-S1  Needle size: 22 guage  Needle type: Spinal  Needle Placement: Articular  Findings:  -Comments: Excellent flow of contrast producing a partial arthrogram.  Procedure Details: The fluoroscope beam is vertically oriented in AP, and the inferior recess is visualized beneath the lower pole of the inferior apophyseal process, which represents the target point for needle insertion. When direct visualization is difficult the target point is located at the medial projection of the vertebral pedicle. The region overlying each aforementioned target is locally anesthetized with a 1 to 2 ml. volume of 1% Lidocaine  without Epinephrine .   The spinal needle was inserted into each of the above mentioned facet joints using biplanar fluoroscopic guidance. A 0.25 to 0.5 ml. volume of Isovue -250 was injected and a partial facet joint arthrogram was obtained. A single spot film was obtained of the resulting arthrogram.    One to 1.25 ml of the steroid/anesthetic solution was then injected into each of the facet joints noted above.   Additional Comments:  The patient tolerated the procedure well Dressing: 2 x 2 sterile gauze and Band-Aid    Post-procedure details: Patient was observed during the procedure. Post-procedure instructions were reviewed.  Patient left the clinic in stable condition.    Clinical History: MRI LUMBAR SPINE WITHOUT CONTRAST   TECHNIQUE: Multiplanar, multisequence MR imaging of the lumbar spine was performed. No  intravenous contrast was administered.   COMPARISON:  None Available.   FINDINGS: Segmentation: Standard.   Alignment:  Grade 1 anterolisthesis of L3 on L4.   Vertebrae: Degenerative endplate marrow changes at a few levels. No compression  fractures.   Conus medullaris and cauda equina: The conus medullaris terminates at the level of L1-L2. The distal spinal cord signal intensity is normal.   Paraspinal and other soft tissues: Renal cysts bilaterally. The visualized aorta is normal.   Disc levels:   L1-L2: Disc bulge. Mild bilateral facet arthropathy. Mild bilateral neuroforaminal stenosis. No spinal canal stenosis.   L2-L3: Disc bulge. Moderate bilateral facet arthropathy. Moderate right neuroforaminal stenosis. Mild spinal canal stenosis.   L3-L4: Disc bulge. Moderate bilateral facet arthropathy. Moderate bilateral neuroforaminal stenosis. Mild spinal canal stenosis.   L4-L5: Disc bulge. Moderate bilateral facet arthropathy. Moderate bilateral neuroforaminal stenosis. Mild spinal canal stenosis.   L5-S1: Disc bulge. Moderate bilateral facet arthropathy. Moderate bilateral neuroforaminal stenosis. Mild spinal canal stenosis.   IMPRESSION: 1. 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 2. Mild canal stenoses at L2-L3, L3-L4, L4-L5 and L5-S1.     Electronically Signed   By: Clem Savory M.D.   On: 03/03/2024 10:03     Objective:  VS:  HT:    WT:   BMI:     BP:(!) 161/80  HR:94bpm  TEMP: ( )  RESP:  Physical Exam Vitals and nursing note reviewed.  Constitutional:      General: She is not in acute distress.    Appearance: Normal appearance. She is not ill-appearing.  HENT:     Head: Normocephalic and atraumatic.     Right Ear: External ear normal.     Left Ear: External ear normal.  Eyes:     Extraocular Movements: Extraocular movements intact.  Cardiovascular:     Rate and Rhythm: Normal rate.     Pulses: Normal pulses.  Pulmonary:     Effort: Pulmonary effort is normal. No respiratory distress.  Abdominal:     General: There is no distension.     Palpations: Abdomen is soft.  Musculoskeletal:        General: Tenderness  present.     Cervical back: Neck supple.     Right lower leg: No edema.     Left lower leg: No edema.     Comments: Patient has good distal strength with no pain over the greater trochanters.  No clonus or focal weakness.  Skin:    Findings: No erythema, lesion or rash.  Neurological:     General: No focal deficit present.     Mental Status: She is alert and oriented to person, place, and time.     Sensory: No sensory deficit.     Motor: No weakness or abnormal muscle tone.     Coordination: Coordination normal.  Psychiatric:        Mood and Affect: Mood normal.        Behavior: Behavior normal.      Imaging: No results found. "

## 2024-07-06 ENCOUNTER — Ambulatory Visit: Admitting: Orthopaedic Surgery

## 2024-07-06 ENCOUNTER — Encounter: Payer: Self-pay | Admitting: Orthopaedic Surgery

## 2024-07-06 DIAGNOSIS — G8929 Other chronic pain: Secondary | ICD-10-CM

## 2024-07-06 DIAGNOSIS — Z96641 Presence of right artificial hip joint: Secondary | ICD-10-CM

## 2024-07-06 DIAGNOSIS — M5441 Lumbago with sciatica, right side: Secondary | ICD-10-CM

## 2024-07-06 DIAGNOSIS — M47816 Spondylosis without myelopathy or radiculopathy, lumbar region: Secondary | ICD-10-CM

## 2024-07-06 NOTE — Progress Notes (Signed)
 The patient is now between 10 and 11 weeks status post a right total hip replacement to treat significant right hip pain and arthritis.  Since we had seen her last she did have an appointment with Dr. Eldonna and has had right sided L4-L5 and L5-S1 facet joint injections due to lumbar spine issues.  She still have it with a cane.  She still has significant low back pain to the right side.  The right hip is doing better overall but she is does not feel confident with weightbearing.  A lot of her pain is in the posterior pelvis and the lower back to the right side.  She does appropriately ask about outpatient physical therapy and I think this is an excellent idea.  She has no restrictions from my standpoint either other than no running.  When she walks you can tell that she still favors her right side and this seems to be more back related than hip related.  I can easily put her right hip through internal and external rotation with no discomfort at all.  I did give her prescription for outpatient physical therapy with [facility] in [place].  Any modalities that therapist can try to decrease her right-sided low back pain and sciatica would be helpful.  We will then see her back in 6 weeks.  At that visit I would like a standing AP pelvis and lateral of her right operative hip.

## 2024-07-13 ENCOUNTER — Encounter: Payer: Self-pay | Admitting: Family

## 2024-08-18 ENCOUNTER — Encounter: Admitting: Orthopaedic Surgery

## 2024-09-09 ENCOUNTER — Ambulatory Visit

## 2025-05-10 ENCOUNTER — Ambulatory Visit: Admitting: Urology
